# Patient Record
Sex: Female | Born: 1935 | Race: White | Hispanic: No | Smoking: Never smoker
Health system: Southern US, Community
[De-identification: ages and names within clinical notes are randomized; demographics above are authoritative.]

## PROBLEM LIST (undated history)

## (undated) DIAGNOSIS — J40 Bronchitis, not specified as acute or chronic: Secondary | ICD-10-CM

## (undated) DIAGNOSIS — I209 Angina pectoris, unspecified: Secondary | ICD-10-CM

## (undated) DIAGNOSIS — F99 Mental disorder, not otherwise specified: Secondary | ICD-10-CM

## (undated) DIAGNOSIS — G479 Sleep disorder, unspecified: Secondary | ICD-10-CM

## (undated) DIAGNOSIS — I10 Essential (primary) hypertension: Secondary | ICD-10-CM

## (undated) DIAGNOSIS — F32A Depression, unspecified: Secondary | ICD-10-CM

## (undated) DIAGNOSIS — M1990 Unspecified osteoarthritis, unspecified site: Secondary | ICD-10-CM

## (undated) DIAGNOSIS — R32 Unspecified urinary incontinence: Secondary | ICD-10-CM

## (undated) DIAGNOSIS — E119 Type 2 diabetes mellitus without complications: Secondary | ICD-10-CM

## (undated) DIAGNOSIS — R0789 Other chest pain: Secondary | ICD-10-CM

## (undated) DIAGNOSIS — C50919 Malignant neoplasm of unspecified site of unspecified female breast: Secondary | ICD-10-CM

## (undated) DIAGNOSIS — R011 Cardiac murmur, unspecified: Secondary | ICD-10-CM

## (undated) DIAGNOSIS — F329 Major depressive disorder, single episode, unspecified: Secondary | ICD-10-CM

## (undated) DIAGNOSIS — E039 Hypothyroidism, unspecified: Secondary | ICD-10-CM

## (undated) DIAGNOSIS — K219 Gastro-esophageal reflux disease without esophagitis: Secondary | ICD-10-CM

## (undated) DIAGNOSIS — R351 Nocturia: Secondary | ICD-10-CM

## (undated) DIAGNOSIS — E7800 Pure hypercholesterolemia, unspecified: Secondary | ICD-10-CM

## (undated) DIAGNOSIS — F419 Anxiety disorder, unspecified: Secondary | ICD-10-CM

## (undated) DIAGNOSIS — I2510 Atherosclerotic heart disease of native coronary artery without angina pectoris: Secondary | ICD-10-CM

## (undated) HISTORY — PX: CARDIAC CATHETERIZATION: SHX172

## (undated) HISTORY — DX: Hypothyroidism, unspecified: E03.9

## (undated) HISTORY — DX: Pure hypercholesterolemia, unspecified: E78.00

## (undated) HISTORY — DX: Essential (primary) hypertension: I10

## (undated) HISTORY — PX: COLONOSCOPY: SHX174

## (undated) HISTORY — PX: CORONARY ANGIOPLASTY: SHX604

## (undated) HISTORY — PX: EYE SURGERY: SHX253

## (undated) HISTORY — DX: Atherosclerotic heart disease of native coronary artery without angina pectoris: I25.10

## (undated) HISTORY — PX: CHOLECYSTECTOMY: SHX55

## (undated) HISTORY — PX: CATARACT EXTRACTION W/ INTRAOCULAR LENS  IMPLANT, BILATERAL: SHX1307

## (undated) HISTORY — PX: OTHER SURGICAL HISTORY: SHX169

## (undated) HISTORY — PX: ULNAR NERVE REPAIR: SHX2594

## (undated) HISTORY — DX: Malignant neoplasm of unspecified site of unspecified female breast: C50.919

---
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
## 1959-04-16 HISTORY — PX: DILATION AND CURETTAGE OF UTERUS: SHX78

## 1962-08-15 HISTORY — PX: ABDOMINAL HYSTERECTOMY: SHX81

## 1970-08-15 HISTORY — PX: TONSILLECTOMY AND ADENOIDECTOMY: SUR1326

## 1989-04-15 HISTORY — PX: NEUROPLASTY / TRANSPOSITION MEDIAN NERVE AT CARPAL TUNNEL BILATERAL: SUR894

## 1989-04-15 HISTORY — PX: PLANTAR FASCIA RELEASE: SHX2239

## 1999-04-16 HISTORY — PX: CORONARY ANGIOPLASTY WITH STENT PLACEMENT: SHX49

## 1999-08-16 HISTORY — PX: MASTECTOMY: SHX3

## 1999-08-16 HISTORY — PX: BREAST BIOPSY: SHX20

## 2001-10-12 ENCOUNTER — Encounter: Admission: RE | Admit: 2001-10-12 | Discharge: 2002-01-10 | Payer: Self-pay | Admitting: Anesthesiology

## 2002-01-14 ENCOUNTER — Encounter: Admission: RE | Admit: 2002-01-14 | Discharge: 2002-04-14 | Payer: Self-pay

## 2002-02-20 ENCOUNTER — Encounter: Payer: Self-pay | Admitting: Oncology

## 2002-02-20 ENCOUNTER — Ambulatory Visit (HOSPITAL_COMMUNITY): Admission: RE | Admit: 2002-02-20 | Discharge: 2002-02-20 | Payer: Self-pay | Admitting: Oncology

## 2002-04-15 ENCOUNTER — Encounter: Payer: Self-pay | Admitting: Emergency Medicine

## 2002-04-15 ENCOUNTER — Inpatient Hospital Stay (HOSPITAL_COMMUNITY): Admission: EM | Admit: 2002-04-15 | Discharge: 2002-04-18 | Payer: Self-pay | Admitting: Emergency Medicine

## 2002-04-18 ENCOUNTER — Encounter: Payer: Self-pay | Admitting: Cardiology

## 2005-02-18 ENCOUNTER — Ambulatory Visit: Payer: Self-pay | Admitting: Oncology

## 2005-04-08 ENCOUNTER — Ambulatory Visit: Payer: Self-pay | Admitting: Cardiology

## 2005-04-13 ENCOUNTER — Ambulatory Visit (HOSPITAL_COMMUNITY): Admission: RE | Admit: 2005-04-13 | Discharge: 2005-04-15 | Payer: Self-pay | Admitting: Cardiology

## 2005-04-13 ENCOUNTER — Ambulatory Visit: Payer: Self-pay | Admitting: Cardiology

## 2005-04-29 ENCOUNTER — Ambulatory Visit: Payer: Self-pay | Admitting: Cardiology

## 2005-06-07 ENCOUNTER — Ambulatory Visit: Payer: Self-pay

## 2005-07-27 ENCOUNTER — Ambulatory Visit: Payer: Self-pay | Admitting: Cardiology

## 2006-01-13 ENCOUNTER — Ambulatory Visit: Payer: Self-pay | Admitting: Cardiology

## 2006-02-09 ENCOUNTER — Ambulatory Visit: Payer: Self-pay | Admitting: Oncology

## 2006-02-27 LAB — CBC WITH DIFFERENTIAL/PLATELET
BASO%: 0.3 % (ref 0.0–2.0)
Basophils Absolute: 0 10*3/uL (ref 0.0–0.1)
EOS%: 2.1 % (ref 0.0–7.0)
Eosinophils Absolute: 0.1 10*3/uL (ref 0.0–0.5)
HCT: 38.3 % (ref 34.8–46.6)
HGB: 13.4 g/dL (ref 11.6–15.9)
LYMPH%: 23.8 % (ref 14.0–48.0)
MCH: 32.7 pg (ref 26.0–34.0)
MCHC: 34.8 g/dL (ref 32.0–36.0)
MCV: 93.9 fL (ref 81.0–101.0)
MONO#: 0.5 10*3/uL (ref 0.1–0.9)
MONO%: 7.6 % (ref 0.0–13.0)
NEUT#: 4.3 10*3/uL (ref 1.5–6.5)
NEUT%: 66.2 % (ref 39.6–76.8)
Platelets: 259 10*3/uL (ref 145–400)
RBC: 4.08 10*6/uL (ref 3.70–5.32)
RDW: 13.3 % (ref 11.3–14.5)
WBC: 6.4 10*3/uL (ref 3.9–10.0)
lymph#: 1.5 10*3/uL (ref 0.9–3.3)

## 2006-02-27 LAB — COMPREHENSIVE METABOLIC PANEL
ALT: 97 U/L — ABNORMAL HIGH (ref 0–40)
AST: 68 U/L — ABNORMAL HIGH (ref 0–37)
Albumin: 4.6 g/dL (ref 3.5–5.2)
Alkaline Phosphatase: 94 U/L (ref 39–117)
BUN: 14 mg/dL (ref 6–23)
CO2: 26 mEq/L (ref 19–32)
Calcium: 9.7 mg/dL (ref 8.4–10.5)
Chloride: 102 mEq/L (ref 96–112)
Creatinine, Ser: 0.78 mg/dL (ref 0.40–1.20)
Glucose, Bld: 102 mg/dL — ABNORMAL HIGH (ref 70–99)
Potassium: 3.6 mEq/L (ref 3.5–5.3)
Sodium: 139 mEq/L (ref 135–145)
Total Bilirubin: 0.4 mg/dL (ref 0.3–1.2)
Total Protein: 7.2 g/dL (ref 6.0–8.3)

## 2006-02-27 LAB — LACTATE DEHYDROGENASE: LDH: 169 U/L (ref 94–250)

## 2006-07-17 ENCOUNTER — Ambulatory Visit: Payer: Self-pay | Admitting: Cardiology

## 2006-07-25 ENCOUNTER — Ambulatory Visit: Payer: Self-pay

## 2006-09-06 ENCOUNTER — Ambulatory Visit: Payer: Self-pay | Admitting: Cardiology

## 2007-02-13 ENCOUNTER — Ambulatory Visit: Payer: Self-pay | Admitting: Cardiology

## 2007-04-27 ENCOUNTER — Ambulatory Visit: Payer: Self-pay | Admitting: Cardiology

## 2007-04-27 LAB — CONVERTED CEMR LAB
ALT: 16 units/L (ref 0–35)
AST: 19 units/L (ref 0–37)
Albumin: 4 g/dL (ref 3.5–5.2)
Alkaline Phosphatase: 64 units/L (ref 39–117)
Bilirubin, Direct: 0.1 mg/dL (ref 0.0–0.3)
Cholesterol: 115 mg/dL (ref 0–200)
HDL: 44.2 mg/dL (ref >39.0)
LDL Cholesterol: 54 mg/dL (ref 0–99)
Total Bilirubin: 0.8 mg/dL (ref 0.3–1.2)
Total CHOL/HDL Ratio: 2.6
Total Protein: 7 g/dL (ref 6.0–8.3)
Triglycerides: 82 mg/dL (ref 0–149)
VLDL: 16 mg/dL (ref 0–40)

## 2007-09-11 ENCOUNTER — Ambulatory Visit: Payer: Self-pay | Admitting: Cardiology

## 2007-09-14 ENCOUNTER — Encounter: Payer: Self-pay | Admitting: Cardiology

## 2007-09-14 ENCOUNTER — Ambulatory Visit: Payer: Self-pay

## 2008-05-06 ENCOUNTER — Ambulatory Visit: Payer: Self-pay | Admitting: Cardiology

## 2008-05-09 ENCOUNTER — Ambulatory Visit: Payer: Self-pay | Admitting: Cardiology

## 2008-05-09 ENCOUNTER — Ambulatory Visit: Payer: Self-pay

## 2008-05-09 LAB — CONVERTED CEMR LAB
ALT: 14 units/L (ref 0–35)
AST: 19 units/L (ref 0–37)
Albumin: 4.2 g/dL (ref 3.5–5.2)
Alkaline Phosphatase: 71 units/L (ref 39–117)
BUN: 10 mg/dL (ref 6–23)
Basophils Absolute: 0 10*3/uL (ref 0.0–0.1)
Basophils Relative: 0.4 % (ref 0.0–3.0)
Bilirubin, Direct: 0.1 mg/dL (ref 0.0–0.3)
CO2: 32 meq/L (ref 19–32)
Calcium: 9.4 mg/dL (ref 8.4–10.5)
Chloride: 106 meq/L (ref 96–112)
Cholesterol: 177 mg/dL (ref 0–200)
Creatinine, Ser: 0.7 mg/dL (ref 0.4–1.2)
Eosinophils Absolute: 0.1 10*3/uL (ref 0.0–0.7)
Eosinophils Relative: 2 % (ref 0.0–5.0)
GFR calc Af Amer: 106 mL/min
GFR calc non Af Amer: 87 mL/min
Glucose, Bld: 87 mg/dL (ref 70–99)
HCT: 42 % (ref 36.0–46.0)
HDL: 69.7 mg/dL (ref >39.0)
Hemoglobin: 14.7 g/dL (ref 12.0–15.0)
LDL Cholesterol: 92 mg/dL (ref 0–99)
Lymphocytes Relative: 25.8 % (ref 12.0–46.0)
MCHC: 35 g/dL (ref 30.0–36.0)
MCV: 91.5 fL (ref 78.0–100.0)
Monocytes Absolute: 0.5 10*3/uL (ref 0.1–1.0)
Monocytes Relative: 8.2 % (ref 3.0–12.0)
Neutro Abs: 3.6 10*3/uL (ref 1.4–7.7)
Neutrophils Relative %: 63.6 % (ref 43.0–77.0)
Platelets: 278 10*3/uL (ref 150–400)
Potassium: 4.6 meq/L (ref 3.5–5.1)
RBC: 4.59 M/uL (ref 3.87–5.11)
RDW: 12.8 % (ref 11.5–14.6)
Sodium: 143 meq/L (ref 135–145)
Total Bilirubin: 0.9 mg/dL (ref 0.3–1.2)
Total CHOL/HDL Ratio: 2.5
Total Protein: 7.2 g/dL (ref 6.0–8.3)
Triglycerides: 75 mg/dL (ref 0–149)
VLDL: 15 mg/dL (ref 0–40)
WBC: 5.6 10*3/uL (ref 4.5–10.5)

## 2008-07-28 DIAGNOSIS — E039 Hypothyroidism, unspecified: Secondary | ICD-10-CM | POA: Insufficient documentation

## 2008-07-28 DIAGNOSIS — E785 Hyperlipidemia, unspecified: Secondary | ICD-10-CM | POA: Insufficient documentation

## 2008-07-28 DIAGNOSIS — I2510 Atherosclerotic heart disease of native coronary artery without angina pectoris: Secondary | ICD-10-CM | POA: Insufficient documentation

## 2008-07-28 DIAGNOSIS — I10 Essential (primary) hypertension: Secondary | ICD-10-CM | POA: Insufficient documentation

## 2008-10-29 ENCOUNTER — Encounter: Payer: Self-pay | Admitting: Cardiology

## 2008-10-29 ENCOUNTER — Ambulatory Visit: Payer: Self-pay | Admitting: Cardiology

## 2008-11-27 ENCOUNTER — Telehealth (INDEPENDENT_AMBULATORY_CARE_PROVIDER_SITE_OTHER): Payer: Self-pay | Admitting: *Deleted

## 2009-02-09 ENCOUNTER — Telehealth: Payer: Self-pay | Admitting: Cardiology

## 2009-02-19 ENCOUNTER — Encounter: Payer: Self-pay | Admitting: Cardiology

## 2009-06-17 ENCOUNTER — Ambulatory Visit: Payer: Self-pay | Admitting: Cardiology

## 2009-06-17 DIAGNOSIS — I359 Nonrheumatic aortic valve disorder, unspecified: Secondary | ICD-10-CM | POA: Insufficient documentation

## 2009-06-22 ENCOUNTER — Ambulatory Visit: Payer: Self-pay | Admitting: Cardiology

## 2009-06-25 LAB — CONVERTED CEMR LAB
BUN: 12 mg/dL (ref 6–23)
Basophils Absolute: 0 10*3/uL (ref 0.0–0.1)
Basophils Relative: 0.3 % (ref 0.0–3.0)
CO2: 32 meq/L (ref 19–32)
Calcium: 9.6 mg/dL (ref 8.4–10.5)
Chloride: 98 meq/L (ref 96–112)
Creatinine, Ser: 0.7 mg/dL (ref 0.4–1.2)
Eosinophils Absolute: 0.1 10*3/uL (ref 0.0–0.7)
Eosinophils Relative: 2 % (ref 0.0–5.0)
GFR calc non Af Amer: 87 mL/min (ref >60)
Glucose, Bld: 83 mg/dL (ref 70–99)
HCT: 38.5 % (ref 36.0–46.0)
Hemoglobin: 13.4 g/dL (ref 12.0–15.0)
INR: 1 (ref 0.8–1.0)
Lymphocytes Relative: 32.7 % (ref 12.0–46.0)
Lymphs Abs: 1.5 10*3/uL (ref 0.7–4.0)
MCHC: 34.7 g/dL (ref 30.0–36.0)
MCV: 93.4 fL (ref 78.0–100.0)
Monocytes Absolute: 0.3 10*3/uL (ref 0.1–1.0)
Monocytes Relative: 7 % (ref 3.0–12.0)
Neutro Abs: 2.7 10*3/uL (ref 1.4–7.7)
Neutrophils Relative %: 58 % (ref 43.0–77.0)
Platelets: 217 10*3/uL (ref 150.0–400.0)
Potassium: 4.3 meq/L (ref 3.5–5.1)
Prothrombin Time: 10.5 s (ref 9.1–11.7)
RBC: 4.12 M/uL (ref 3.87–5.11)
RDW: 12.4 % (ref 11.5–14.6)
Sodium: 138 meq/L (ref 135–145)
WBC: 4.6 10*3/uL (ref 4.5–10.5)

## 2009-06-26 ENCOUNTER — Encounter: Payer: Self-pay | Admitting: Cardiology

## 2009-06-26 ENCOUNTER — Ambulatory Visit: Payer: Self-pay | Admitting: Cardiology

## 2009-06-26 ENCOUNTER — Telehealth: Payer: Self-pay | Admitting: Cardiology

## 2009-06-26 ENCOUNTER — Ambulatory Visit: Payer: Self-pay

## 2009-06-26 ENCOUNTER — Ambulatory Visit (HOSPITAL_COMMUNITY): Admission: RE | Admit: 2009-06-26 | Discharge: 2009-06-26 | Payer: Self-pay | Admitting: Cardiology

## 2009-06-26 ENCOUNTER — Inpatient Hospital Stay (HOSPITAL_BASED_OUTPATIENT_CLINIC_OR_DEPARTMENT_OTHER): Admission: RE | Admit: 2009-06-26 | Discharge: 2009-06-26 | Payer: Self-pay | Admitting: Cardiology

## 2009-07-03 ENCOUNTER — Ambulatory Visit: Payer: Self-pay | Admitting: Cardiology

## 2009-07-03 DIAGNOSIS — I5033 Acute on chronic diastolic (congestive) heart failure: Secondary | ICD-10-CM

## 2009-07-03 DIAGNOSIS — I5032 Chronic diastolic (congestive) heart failure: Secondary | ICD-10-CM | POA: Insufficient documentation

## 2010-06-03 ENCOUNTER — Encounter: Payer: Self-pay | Admitting: Cardiology

## 2010-06-07 ENCOUNTER — Ambulatory Visit: Payer: Self-pay | Admitting: Cardiology

## 2010-06-08 ENCOUNTER — Encounter: Payer: Self-pay | Admitting: Cardiology

## 2010-07-22 ENCOUNTER — Ambulatory Visit (HOSPITAL_COMMUNITY)
Admission: RE | Admit: 2010-07-22 | Discharge: 2010-07-23 | Payer: Self-pay | Source: Home / Self Care | Attending: Ophthalmology | Admitting: Ophthalmology

## 2010-09-16 NOTE — Cardiovascular Report (Signed)
Summary: Pre Cath Orders  Pre Cath Orders   Imported By: Roderic Ovens 07/02/2009 14:40:51  _____________________________________________________________________  External Attachment:    Type:   Image     Comment:   External Document

## 2010-09-16 NOTE — Progress Notes (Signed)
   Sent To [facility] A [employer] request for Records.

## 2010-09-16 NOTE — Assessment & Plan Note (Signed)
Summary: f1y   Visit Type:  1 year follow up Primary Provider:  Dr Lindwood Qua  CC:  chest pain and stabbing.  History of Present Illness: 77 yo with h/o CAD s/p PCI to LAD presents for cardiology followup.  Given chest pain and shortness of breath, she had a left heart cath in 11/10 that showed a patent proximal LAD stent and 50% mid LAD stenosis, similar to prior cath (2006).  I think that her chest pain was noncardiac.  Since that time, she has continued to have occasional episodes of atypical chest pain.  She has 1-2 episodes of chest tightness radiating to her neck each month.  Usually episodes last 3-4 minutes.  They are not exertional.  She had an episode about a month ago after eating lunch that was especially severe. This is a chronic pattern of chest pain with no change.  No exertional dyspnea or chest pain.  BP is slightly elevated today but has been < 140 systolic when checked at home.     Labs (7/10): HDL 72, LDL 59, LFTs normal  Problems Prior to Update: 1)  Diastolic Heart Failure, Chronic  (ICD-428.32) 2)  Aortic Insufficiency  (ICD-424.1) 3)  Unspecified Hypothyroidism  (ICD-244.9) 4)  Hypertension, Unspecified  (ICD-401.9) 5)  Hyperlipidemia-mixed  (ICD-272.4) 6)  Cad, Native Vessel  (ICD-414.01)  Current Medications (verified): 1)  Micardis Hct 80-12.5 Mg Tabs (Telmisartan-Hctz) .... Once Daily 2)  Toprol Xl 50 Mg Xr24h-Tab (Metoprolol Succinate) .... Once Daily 3)  Lipitor 80 Mg Tabs (Atorvastatin Calcium) .... Take One Tablet By Mouth Daily. 4)  Synthroid 75 Mcg Tabs (Levothyroxine Sodium) .... Once Daily 5)  Adult Aspirin Ec Low Strength 81 Mg Tbec (Aspirin) .... Qd 6)  Trazodone Hcl 50 Mg Tabs (Trazodone Hcl) .... 3 At Bedtime For Sleep 7)  Venlafaxine Hcl 75 Mg Tabs (Venlafaxine Hcl) .... Two Times A Day 8)  Vitamin D 400 Unit  Tabs (Cholecalciferol) .... Once Daily 9)  Isosorbide Mononitrate Cr 30 Mg Xr24h-Tab (Isosorbide Mononitrate) .... Take One Tablet By  Mouth Daily  Allergies (verified): No Known Drug Allergies  Past History:  Past Medical History: Reviewed history from 07/03/2009 and no changes required. 1. Hypercholesterolemia. 2. Coronary artery disease.  The patient had a left heart catheterization in August 2006 showed an EF of 70%, a 70% mid LAD lesion and 80% distal LAD lesion, as well as a 50% ostial first diagonal lesion.  The patient did have a drug-eluting stent placed in her mid LAD at that time.  She was on Plavix for 2-1/2 years. She stopped Plavix in January 2009.  Adenosine myoview (9/09):  EF 69% with normal perfusion images suggesting no ischemia or infarction.  Repeat LHC for CP (11/10) showed patent prox LAD stent, 50% mLAD stenosis.  Study was similar to 2006.  3. Diastolic CHF: Echo (11/10) with EF 55-60%, moderate diastolic dysfunction, no regional WMAs, mild to moderate TR, moderate LAE, mild AI.  4. Hypertension. 5. Hypothyroidism.  6. Breast CA: treated with mastectomy/tamoxifen in early 2000s.    Family History: Reviewed history from 07/28/2008 and no changes required. Noncontributory.   Social History: Reviewed history from 07/03/2009 and no changes required. The patient works part-time at a day care.  She is a nonsmoker. She lives in [place]. She is married.   Vital Signs:  Patient profile:   77 year old female Height:      63 inches Weight:      140.50 pounds Pulse rate:  60 / minute BP sitting:   142 / 68  (left arm) Cuff size:   regular  Vitals Entered By: Caralee Ates CMA (June 07, 2010 8:57 AM)  Physical Exam  General:  Well developed, well nourished, in no acute distress. Neck:  Neck supple, no JVD. No masses, thyromegaly or abnormal cervical nodes. Lungs:  Clear bilaterally to auscultation and percussion. Heart:  Non-displaced PMI, chest non-tender; regular rate and rhythm, S1, S2 without rubs or gallops.  1/6 SEM RUSB.  Carotid upstroke normal, no bruit.  Pedals normal pulses. No  edema, no varicosities. Right groin cath site benign.   Abdomen:  Bowel sounds positive; abdomen soft and non-tender without masses, organomegaly, or hernias noted. No hepatosplenomegaly. Extremities:  No clubbing or cyanosis. Neurologic:  Alert and oriented x 3. Psych:  Normal affect.   Impression & Recommendations:  Problem # 1:  CAD, NATIVE VESSEL (ICD-414.01) Occasional, atypical chest pain episodes in a patient with known CAD. This is chronic and has not changed.  No exertional chest pain.  Continue current cardiac meds: ARB, beta blocker, statin, ASA, Imdur.  No stress test unless pattern worsens or changes.   Problem # 2:  HYPERTENSION, UNSPECIFIED (ICD-401.9) BP has been doing well at home, marginally elevated today.  Continue same meds.   Problem # 3:  HYPERLIPIDEMIA-MIXED (ICD-272.4) We will ask for lipids from her PCP.  Goal LDL < 70.   Problem # 4:  AORTIC INSUFFICIENCY (ICD-424.1) Mild on most recent echo.  Should likely repeat echo in a year or so to assess for any progression.   Patient Instructions: 1)  Your physician wants you to follow-up in: 1 year with Dr Shirlee Latch.   You will receive a reminder letter in the mail two months in advance. If you don't receive a letter, please call our office to schedule the follow-up appointment.

## 2010-09-16 NOTE — Assessment & Plan Note (Signed)
Visit Type:  Follow-up Primary Provider:  Dr Lindwood Qua  CC:  tightness in chest/uncomfortable in chest/washed out.  History of Present Illness: 77 yo with h/o CAD s/p PCI to LAD presents for cardiology followup.  At last appt, pt reported some atypical CP.  We did an adenosine myoview in 9/09 that was low risk (no evidence for ischemia/infarction).  She continues to have episodes of atypical CP.  These manifest as tightness across the lower chest that typically occurs at rest.  It occurs 5-6 times a week and is mild in intensity.  She has started exercising again, walking 3 miles/day.  She gets mildly short of breath going up a hill but has had no chest tightness with exercise.    9/09 Labs:  LDL 92, HDL 69, TG 75  Current Medications (verified): 1)  Micardis Hct 80-12.5 Mg Tabs (Telmisartan-Hctz) .... Once Daily 2)  Toprol Xl 50 Mg Xr24h-Tab (Metoprolol Succinate) .... Once Daily 3)  Zocor 80  Mg Tabs (Simvastatin) .... Once Daily 4)  Imdur 60 Mg Xr24h-Tab (Isosorbide Mononitrate) .... Once Daily 5)  Synthroid 75 Mcg Tabs (Levothyroxine Sodium) .... Once Daily 6)  Adult Aspirin Ec Low Strength 81 Mg Tbec (Aspirin) .... Qd 7)  Trazodone Hcl 50 Mg Tabs (Trazodone Hcl) .Marland Kitchen.. 1-3 As Needed As Needed Sleep 8)  Womens One A Day Mvi .... One Daily  Past History:  Past Medical History:    1. Hypercholesterolemia.    2. Coronary artery disease.  The patient had a left heart        catheterization in August 2006 showed an EF of 70%, a 70% mid LAD        lesion and 80% distal LAD lesion, as well as a 50% ostial first        diagonal lesion.  The patient did have a drug-eluting stent placed        in her mid LAD at that time.  She was on Plavix for 2-1/2 years.        She stopped Plavix in January 2009.  Adenosine myoview (9/09):  EF 69% with normal perfusion images suggesting no ischemia or infarction.      3. Echocardiogram in January 2009, this showed mild LVH, EF of 55-65%,  moderate left atrial enlargement, mild aortic insufficiency and        aortic sclerosis without aortic     4. Hypertension.    5. Hypothyroidism.   Family History:    Reviewed history from 07/28/2008 and no changes required:       Noncontributory.   Social History:    Reviewed history from 07/28/2008 and no changes required:       The patient works part-time at a day care.  She is a       nonsmoker.   Review of Systems       Negative except as per HPI  Vital Signs:  Patient profile:   77 year old female Height:      63 inches Weight:      138 pounds BMI:     24.53 Pulse rate:   56 / minute Pulse rhythm:   regular BP sitting:   130 / 68  (left arm)  Vitals Entered By: Burnett Kanaris, CMA (October 29, 2008 2:52 PM)  Physical Exam  General:  Well developed, well nourished, in no acute distress. Head:  normocephalic and atraumatic Nose:  no deformity, discharge, inflammation, or lesions Mouth:  Teeth, gums and palate normal.  Oral mucosa normal. Neck:  Neck supple, no JVD. No masses, thyromegaly or abnormal cervical nodes. Lungs:  Clear bilaterally to auscultation and percussion. Heart:  Non-displaced PMI, chest non-tender; regular rate and rhythm, S1, S2 without murmurs, rubs or gallops. Carotid upstroke normal, no bruit. Pedals normal pulses. No edema, no varicosities. Abdomen:  Bowel sounds positive; abdomen soft and non-tender without masses, organomegaly, or hernias noted. No hepatosplenomegaly. Msk:  Back normal, normal gait. Muscle strength and tone normal. Extremities:  No clubbing or cyanosis. Neurologic:  Alert and oriented x 3. Skin:  Intact without lesions or rashes. Psych:  Normal affect.   Impression & Recommendations:  Problem # 1:  CAD, NATIVE VESSEL (ICD-414.01) Pt continues to have atypical CP symptoms.  Her adenosine myoview was a normal study in 9/09 (when she was having the same symptoms) so was reassuring.  She does not get CP with exertion.  I have  told pt that if she has any exertional symptoms, we will proceed to heart catheterization.  For now, we will increase her Imdur to 90 mg daily to see if going up on nitrates has any effect.  She will continue her ARB, beta blocker, ASA, and statin.    Problem # 2:  HYPERTENSION, UNSPECIFIED (ICD-401.9) Blood pressure is at goal.    Problem # 3:  HYPERLIPIDEMIA-MIXED (ICD-272.4) We increased her Zocor at last appt with goal LDL < 70.  She will get followup lipids and LFTs.

## 2010-09-16 NOTE — Progress Notes (Signed)
Summary: clarify meds  Phone Note Refill Request Call back at [phone] Message from:  Pharmacy on [facility]    prescription written for Imdur.  patient has been taking generic.  Is it ok for her to continue on generic?  Initial call taken by: Burnard Leigh,  June 26, 2009 4:24 PM  Follow-up for Phone Call        Dr. Shirlee Latch put her back on this medication today.  She had a cath over at [facility].  It will be fine to continue generic.   Follow-up by: Judithe Modest CMA,  June 26, 2009 4:53 PM

## 2010-09-16 NOTE — Assessment & Plan Note (Signed)
Summary: 3wk f/u sl   Primary Provider:  Dr Lindwood Qua   History of Present Illness: 77 yo with h/o CAD s/p PCI to LAD presents for cardiology followup.  At last appointment, she reported continued episodes of CP.  These manifested as tightness across the lower chest typically occurring at rest, often when she sits down after exerting herself.  Pain typically lasts for about a minute.  Episodes have been occurring once weekly for over a year now.  In 9/09, she had an adenosine myoview with no evidence for ischemia or infarction.  She sometimes takes nitroglycerine and feels that it can help.  She is short of breath with mopping and vacuuming, also after walking about 1/2-3/4 miles on flat ground.  As the chest pain had worsened over the last couple of months, we ended up deciding to repeat a cardiac catheterization.  This was done in 11/10 and showed a patent proximal LAD stent and 50% mid LAD stenosis, similar to prior cath (2006).  I suspect that her chest pain is noncardiac, ? stress related.  She continues to have occasional chest pain episodes, most recently today.  She had sharp pain in the center of her chest on and off for much of the morning.  Episodes lasted only about 30 sec at a time.  Followup echo showed normal LV systolic function and diastolic dysfunction.  She has lost about 5 lbs since last appointment and is avoiding salt.    Labs (7/10): HDL 72, LDL 59, LFTs normal  Current Medications (verified): 1)  Micardis Hct 80-12.5 Mg Tabs (Telmisartan-Hctz) .... Once Daily 2)  Toprol Xl 50 Mg Xr24h-Tab (Metoprolol Succinate) .... Once Daily 3)  Lipitor 80 Mg Tabs (Atorvastatin Calcium) .... Take One Tablet By Mouth Daily. 4)  Synthroid 75 Mcg Tabs (Levothyroxine Sodium) .... Once Daily 5)  Adult Aspirin Ec Low Strength 81 Mg Tbec (Aspirin) .... Qd 6)  Trazodone Hcl 50 Mg Tabs (Trazodone Hcl) .... 3 At Bedtime For Sleep 7)  Venlafaxine Hcl 75 Mg Tabs (Venlafaxine Hcl) .... Two Times A  Day 8)  Calcium Carbonate-Vitamin D 600-400 Mg-Unit  Tabs (Calcium Carbonate-Vitamin D) .... Once Daily 9)  Vitamin D 400 Unit  Tabs (Cholecalciferol) .... Once Daily 10)  Isosorbide Mononitrate Cr 30 Mg Xr24h-Tab (Isosorbide Mononitrate) .... Take One Tablet By Mouth Daily  Allergies (verified): No Known Drug Allergies  Past History:  Past Medical History: 1. Hypercholesterolemia. 2. Coronary artery disease.  The patient had a left heart catheterization in August 2006 showed an EF of 70%, a 70% mid LAD lesion and 80% distal LAD lesion, as well as a 50% ostial first diagonal lesion.  The patient did have a drug-eluting stent placed in her mid LAD at that time.  She was on Plavix for 2-1/2 years. She stopped Plavix in January 2009.  Adenosine myoview (9/09):  EF 69% with normal perfusion images suggesting no ischemia or infarction.  Repeat LHC for CP (11/10) showed patent prox LAD stent, 50% mLAD stenosis.  Study was similar to 2006.  3. Diastolic CHF: Echo (11/10) with EF 55-60%, moderate diastolic dysfunction, no regional WMAs, mild to moderate TR, moderate LAE, mild AI.  4. Hypertension. 5. Hypothyroidism.  6. Breast CA: treated with mastectomy/tamoxifen in early 2000s.    Family History: Reviewed history from 07/28/2008 and no changes required. Noncontributory.   Social History: Reviewed history from 06/17/2009 and no changes required. The patient works part-time at a day care.  She is a nonsmoker. She  lives in [place]. She is married.   Review of Systems       All systems reviewed and negative except as per HPI.   Vital Signs:  Patient profile:   76 year old female Height:      63 inches Weight:      138 pounds BMI:     24.53 Pulse rate:   70 / minute BP sitting:   130 / 70  (left arm) Cuff size:   regular  Vitals Entered By: Hardin Negus, RMA (July 03, 2009 4:28 PM)  Physical Exam  General:  Well developed, well nourished, in no acute distress. Neck:  Neck  supple, no JVD. No masses, thyromegaly or abnormal cervical nodes. Lungs:  Clear bilaterally to auscultation and percussion. Heart:  Non-displaced PMI, chest non-tender; regular rate and rhythm, S1, S2 without rubs or gallops.  1/6 SEM RUSB.  Carotid upstroke normal, no bruit.  Pedals normal pulses. No edema, no varicosities. Right groin cath site benign.   Abdomen:  Bowel sounds positive; abdomen soft and non-tender without masses, organomegaly, or hernias noted. No hepatosplenomegaly. Extremities:  No clubbing or cyanosis. Neurologic:  Alert and oriented x 3. Psych:  Normal affect.   Impression & Recommendations:  Problem # 1:  CAD, NATIVE VESSEL (ICD-414.01) Patient has chest pain with both typical and atypical features.  LHC this month showed stable 50% mLAD stenosis that is unlikely to be the cause of her symptoms.  I think that she has noncardiac chest pain.  She needs to continue aggressive risk factor management with ARB, beta blocker, statin, ASA.  Lipids were at goal when last checked.    Problem # 2:  AORTIC INSUFFICIENCY (ICD-424.1) Mild on most recent echo.    Problem # 3:  HYPERTENSION, UNSPECIFIED (ICD-401.9) Well-controlled on current regimen.   Problem # 4:  DIASTOLIC HEART FAILURE, CHRONIC (ICD-428.32) NYHA class II exertional dyspnea.  No volume overload on exam.  Moderate diastolic dysfunction on echo with preserved EF.  Best management here will be good blood pressure control.   I will see her in followup in 1 year unless she needs to be seen sooner.   Patient Instructions: 1)  Please ask Dr Mikey Bussing to fax your cholesterol results to Dr Shirlee Latch [phone] 2)  Your physician recommends that you schedule a follow-up appointment in: 1 year with Dr. Marca Ancona --you should get a letter in the mail 2 months before the appointment --if you do not get a letter call our office and schedule an appointment  [phone]

## 2010-09-16 NOTE — Assessment & Plan Note (Signed)
Summary: f45m   Primary Provider:  Dr Lindwood Qua   History of Present Illness: 77 yo with h/o CAD s/p PCI to LAD presents for cardiology followup.  She continues to have episodes of CP.  These manifest as tightness across the lower chest that typically occurs at rest, often when she sits down after exerting herself.  Pain typically lasts for about a minute.  Episodes are occurring once weekly and have been occurring for over a year now.  In 9/09, she had an adenosine myoview with no evidence for ischemia or infarction.  She sometimes takes nitroglycerine and feels that it can help.  Chest pain seems worse over the last couple of months and has been worrying her recently.  She had a more prolonged episode recently while in [place] visiting her new great-granddaughter.  She is short of breath with mopping and vacuuming, also after walking about 1/2-3/4 miles on flat ground.   Labs (7/10): HDL 72, LDL 59, LFTs normal  ECG: NSR, normal  Current Medications (verified): 1)  Micardis Hct 80-12.5 Mg Tabs (Telmisartan-Hctz) .... Once Daily 2)  Toprol Xl 50 Mg Xr24h-Tab (Metoprolol Succinate) .... Once Daily 3)  Lipitor 80 Mg Tabs (Atorvastatin Calcium) .... Take One Tablet By Mouth Daily. 4)  Synthroid 75 Mcg Tabs (Levothyroxine Sodium) .... Once Daily 5)  Adult Aspirin Ec Low Strength 81 Mg Tbec (Aspirin) .... Qd 6)  Trazodone Hcl 50 Mg Tabs (Trazodone Hcl) .... 3 At Bedtime For Sleep 7)  Womens One A Day Mvi .... One Daily 8)  Venlafaxine Hcl 75 Mg Tabs (Venlafaxine Hcl) .... Two Times A Day 9)  Calcium Carbonate-Vitamin D 600-400 Mg-Unit  Tabs (Calcium Carbonate-Vitamin D) .... Once Daily 10)  Vitamin D 400 Unit  Tabs (Cholecalciferol) .... Once Daily  Allergies (verified): No Known Drug Allergies  Past History:  Past Medical History: 1. Hypercholesterolemia. 2. Coronary artery disease.  The patient had a left heart catheterization in August 2006 showed an EF of 70%, a 70% mid LAD lesion  and 80% distal LAD lesion, as well as a 50% ostial first diagonal lesion.  The patient did have a drug-eluting stent placed in her mid LAD at that time.  She was on Plavix for 2-1/2 years. She stopped Plavix in January 2009.  Adenosine myoview (9/09):  EF 69% with normal perfusion images suggesting no ischemia or infarction.   3. Echocardiogram in January 2009, this showed mild LVH, EF of 55-65%, moderate left atrial enlargement, mild aortic insufficiency and aortic sclerosis without aortic  4. Hypertension. 5. Hypothyroidism.   Family History: Reviewed history from 07/28/2008 and no changes required. Noncontributory.   Social History: The patient works part-time at a day care.  She is a nonsmoker. She lives in [place].   Review of Systems       All systems reviewed and negative except as per HPI.   Vital Signs:  Patient profile:   77 year old female Height:      63 inches Weight:      143 pounds BMI:     25.42 Pulse rate:   81 / minute BP sitting:   142 / 70  (left arm) Cuff size:   regular  Vitals Entered By: Hardin Negus, RMA (June 17, 2009 12:04 PM)  Physical Exam  General:  Well developed, well nourished, in no acute distress. Neck:  Neck supple, no JVD. No masses, thyromegaly or abnormal cervical nodes. Lungs:  Clear bilaterally to auscultation and percussion. Heart:  Non-displaced PMI, chest non-tender; regular rate and rhythm, S1, S2 without murmurs, rubs or gallops. Carotid upstroke normal, no bruit.  Pedals normal pulses. No edema, no varicosities. Abdomen:  Bowel sounds positive; abdomen soft and non-tender without masses, organomegaly, or hernias noted. No hepatosplenomegaly. Extremities:  No clubbing or cyanosis. Neurologic:  Alert and oriented x 3. Psych:  Normal affect.   Impression & Recommendations:  Problem # 1:  CAD, NATIVE VESSEL (ICD-414.01) Patient has had episodes of atypical chest pain for over a year now.  They seem to be worsening  recently.  Interestingly, pain tends to occur when she is sitting down after exerting herself.  Nitroglycerine sometimes relieves symptoms.  She had a negative adenosine myoview in 9/09.  She is becoming worried that she has another blockage.  Given the normal stress test in the setting of similar pain, I think that our best choice at this point to clear up what is going on will be a left heart cath.  I will set her up for this next week.  She should continue ASA, statin, ARB, and beta blocker.    Problem # 2:  AORTIC INSUFFICIENCY (ICD-424.1) HIstory of mild aortic insufficiency.  Repeat echo to assess for progression.  No diastolic murmur noted.   Problem # 3:  HYPERTENSION, UNSPECIFIED (ICD-401.9) BP slightly high today but has been running consistently in the 130s at home.   Problem # 4:  HYPERLIPIDEMIA-MIXED (ICD-272.4) Lipids reasonably controlled.   Other Orders: Echocardiogram (Echo) Cardiac Catheterization (Cardiac Cath)  Patient Instructions: 1)  Your physician recommends that you return for lab work on Monday November 8,2010--BMP/CBC/PT 786.50 424.1 401.9 2)  Your physician has requested that you have an echocardiogram.  Echocardiography is a painless test that uses sound waves to create images of your heart. It provides your doctor with information about the size and shape of your heart and how well your heart's chambers and valves are working.  This procedure takes approximately one hour. There are no restrictions for this procedure. 3)  Your physician has requested that you have a cardiac catheterization.  Cardiac catheterization is used to diagnose and/or treat various heart conditions. Doctors may recommend this procedure for a number of different reasons. The most common reason is to evaluate chest pain. Chest pain can be a symptom of coronary artery disease (CAD), and cardiac catheterization can show whether plaque is narrowing or blocking your heart's arteries. This procedure is  also used to evaluate the valves, as well as measure the blood flow and oxygen levels in different parts of your heart.  For further information please visit [email or web address]  Please follow instruction sheet, as given. 4)  Your physician recommends that you schedule a follow-up appointment in: 2-3 weeks with Dr. Marca Ancona

## 2010-10-25 LAB — CBC
HCT: 37.7 % (ref 36.0–46.0)
Hemoglobin: 12.5 g/dL (ref 12.0–15.0)
MCH: 30.6 pg (ref 26.0–34.0)
MCHC: 33.2 g/dL (ref 30.0–36.0)
MCV: 92.2 fL (ref 78.0–100.0)
Platelets: 210 10*3/uL (ref 150–400)
RBC: 4.09 MIL/uL (ref 3.87–5.11)
RDW: 12.7 % (ref 11.5–15.5)
WBC: 4.9 10*3/uL (ref 4.0–10.5)

## 2010-10-25 LAB — BASIC METABOLIC PANEL
BUN: 9 mg/dL (ref 6–23)
CO2: 32 mEq/L (ref 19–32)
Calcium: 9.6 mg/dL (ref 8.4–10.5)
Chloride: 104 mEq/L (ref 96–112)
Creatinine, Ser: 0.69 mg/dL (ref 0.4–1.2)
GFR calc Af Amer: >60 mL/min (ref >60)
GFR calc non Af Amer: >60 mL/min (ref >60)
Glucose, Bld: 113 mg/dL — ABNORMAL HIGH (ref 70–99)
Potassium: 4.8 mEq/L (ref 3.5–5.1)
Sodium: 140 mEq/L (ref 135–145)

## 2010-10-25 LAB — GLUCOSE, CAPILLARY
Glucose-Capillary: 113 mg/dL — ABNORMAL HIGH (ref 70–99)
Glucose-Capillary: 142 mg/dL — ABNORMAL HIGH (ref 70–99)
Glucose-Capillary: 161 mg/dL — ABNORMAL HIGH (ref 70–99)
Glucose-Capillary: 190 mg/dL — ABNORMAL HIGH (ref 70–99)

## 2010-10-25 LAB — SURGICAL PCR SCREEN
MRSA, PCR: POSITIVE — AB
Staphylococcus aureus: POSITIVE — AB

## 2010-11-17 LAB — POCT I-STAT GLUCOSE
Glucose, Bld: 92 mg/dL (ref 70–99)
Operator id: 221371

## 2010-12-28 NOTE — Assessment & Plan Note (Signed)
[facility]                            CARDIOLOGY OFFICE NOTE   NAME:[name] [name], [name] [name]                         MRN:          [id number]  DATE:09/11/2007                            DOB:          June 09, 1934    FOLLOW-UP VISIT   PRIMARY CARE PHYSICIAN:  Dr. Lindwood Qua.   REASON FOR VISIT:  Cardiology followup.   HISTORY OF PRESENT ILLNESS:  [name] [name] is doing well.  She has lost  approximately 75 pounds, with a combination of diet and exercise.  She  is not reporting any significant angina at this time.  She states that  she was told she had a loud cardiac murmur, noted on her recent  examination.  She has had no problems of fevers or chills, rashes,  malaise.  Her electrocardiogram is stable showing sinus bradycardia,  with a relatively low voltage.  Her medical regimen is outlined below  and has been stable.  Follow-up lipids on simvastatin looked good with  an LDL of 54, HDL of 44, triglycerides 82, and a total cholesterol of  115.  She had normal liver function tests.  She has not been exercising  quite as regularly with the cold weather.   ALLERGIES:  NO KNOWN DRUG ALLERGIES.   MEDICATIONS:  1. Micardis 80/12.5 mg p.o.q.d.  2. Synthroid 75 mcg p.o.q.d.  3. Plavix 75 mg p.o.q.d. (out).  4. Aspirin 325 mg p.o.q.d.  5. Metoprolol XL 50 mg p.o.q.d.  6. Imdur 30 mg p.o.q.d.  7. Simvastatin 40 mg p.o. q.h.s.   REVIEW OF SYSTEMS:  As described in history of present illness.   EXAMINATION:  VITAL SIGNS:  Blood pressure is 130/75, heart rate is 60,  weight is 136 pounds.  GENERAL:  The patient is comfortable, no acute distress.  HEENT:  Conjunctivae was normal.  Pharynx is clear.  NECK:  Supple.  No elevated jugular venous pressure, no loud bruits.  LUNGS:  Clear without labored breathing at rest.  CARDIOVASCULAR:  Reveals a regular rate and rhythm, 2/6 systolic murmur  heard at the base, second heart sounds preserved.  No S3 gallop or  pericardial rub.  ABDOMEN:  Soft, nontender.  Normoactive bowel sounds.  EXTREMITIES:  Exhibit no significant pitting edema.  Distal pulses are  2+.  SKIN:  Warm and dry.  MUSCULOSKELETAL:  No kyphosis is noted.  NEUROPSYCHIATRIC:  The patient alert and oriented x3.  Affect is  appropriate.   IMPRESSION/RECOMMENDATION:  1. Coronary disease status post drug-eluting stent placement to left      anterior descending in August of 2006, with otherwise medically      managed 80% distal left anterior descending stenosis.  The patient      is doing quite well on medical therapy and has lost approximately      75 pounds to a combination of diet and exercise.  She ran out of      Plavix approximately 3 weeks ago, and we talked about discontinuing      this medicine, since she is 2-1/2 years out from her drug-eluting  stent.  She has a relatively low risk of late stent thrombosis at      this time.  I explained that continuing her full-dose aspirin is,      however, very important.  I will plan to see her back over the next      6 months for symptom review.  2. Hyperlipidemia, well controlled on simvastatin.  3. Cardiac murmur, likely functional.  It may be that this is more      prominent, now that she has lost so much weight.  We will obtain a      baseline echocardiogram to exclude any significant aortic valve      disease.     Jonelle Sidle, MD  Electronically Signed    SGM/MedQ  DD: 09/11/2007  DT: 09/12/2007  Job #: [id number]

## 2010-12-28 NOTE — Assessment & Plan Note (Signed)
[facility]                            CARDIOLOGY OFFICE NOTE   NAME:[name], [name] MATUSKA                         MRN:          [id number]  DATE:05/06/2008                            DOB:          30-May-1934    PRIMARY CARE PHYSICIAN:  Dr. Lindwood [name]   HISTORY OF PRESENT ILLNESS:  This is a 77 year old with a history of  coronary artery disease status post PCI to the mid LAD who presents to  Cardiology Clinic for her routine followup.  The patient states that she  had been doing well in general until about a week or 10 days ago when  she had a very mild episode of substernal chest tightness that felt like  gas.  This was about 3/10 in intensity on a pain scale.  She had just  finished washing the dishes at the time.  She says that the same, very  mild sensation of chest discomfort reoccurred a couple of days later.  At that time she was doing the laundry.  It was once again also mild  both.  Both episodes resolved very quickly within 1 or 2 minutes.  She  has had no recurrence of this chest pain or pressure over the last week.  Her activity level has been the same.  She does housework.  She climb up  and down stairs and she can walk as far as she wants on flat ground and  with mildest activities over the past week she had a recurrence of chest  pain.  She is able to do all her normal activities without any  significant shortness of breath.  She has no orthopnea or PND.  No  episodes of syncope or palpitations.   SOCIAL HISTORY:  The patient works part-time at a day care.  She is a  nonsmoker.   MEDICATIONS:  1. Telmisartan/HCTZ 80/12.5 mg daily.  2. Toprol-XL 50 mg daily.  3. Zocor 40 mg daily.  4. Aspirin 81 mg daily.  5. Synthroid 75 mcg daily.  6. Imdur 30 mg b.i.d.   PAST MEDICAL HISTORY:  1. Hypercholesterolemia.  2. Coronary artery disease.  The patient had a left heart      catheterization in August 2006 showed an EF of 74%, a 70% mid  LAD      lesion and 80% distal LAD lesion, as well as a 50% ostial to first      diagonal lesion.  The patient did have a drug-eluting stent placed      in her mid LAD at that time.  She was on Plavix for 2-1/2 years.      She stopped Plavix in January 2009.  3. Echocardiogram in January 2009, this showed mild LVH, EF of 55-65%,      moderate left atrial enlargement, mild aortic insufficiency and      aortic sclerosis without aortic stenosis.  EKG today, normal sinus      rhythm.  No ST-T wave changes, no Qs.  There are no recent labs for      my review.   PHYSICAL EXAMINATION:  VITAL SIGNS:  Blood pressure 134/72, heart rate  56 and regular.  GENERAL:  No apparent distress.  She is a well-developed elderly female.  NECK:  No JVD.  No thyromegaly or nodule.  There is no carotid bruit.  LUNGS:  Clear to auscultation bilaterally.  HEART:  Regular S1 and S2.  No S3.  No S4.  There is a 1/6 systolic  crescendo-decrescendo murmur at the right upper sternal border.  ABDOMEN:  Soft, nontender, no hepatosplenomegaly.  EXTREMITIES:  There is no peripheral edema. No clubbing or cyanosis.  NEUROLOGIC:  Alert and oriented x3.  Normal affect.   ASSESSMENT AND PLAN:  This is a 77 year old with history of  hypercholesterolemia and coronary artery disease who presents to  Cardiology Clinic for routine followup.  She does note 2 mild episodes  of what appeared to be anginal-type chest pain about a week or 2 ago.  1. Coronary artery disease.  The patient does have known coronary      artery disease.  She had a drug-eluting stent placed in her mid      left anterior descending (coronary artery) in 2006.  She did have a      residual 80% distal left anterior descending (coronary artery)      lesion.  The patient did have 2 episodes of mild chest pain couple      of weeks ago, one while washing the dishes and one while doing the      laundry, both resolved very quickly.  She has had no recurrence of       that chest pain despite doing similar levels of exertion since      then.  This chest pain does sound like it could be ischemic in      nature.  I talked to the patient for a while about this and we      decided that we will do an exercise treadmill Myoview.  If there is      only a small area of ischemia, I think we can continue to manage      this medically.  However, if there is a larger area of ischemia      that suggests the possibility of restenosis of her mid LAD stent,      then I think we would go forward with a heart catheterization.  She      will continue on her aspirin, her beta-blocker, her ARB, and her      statin.  I have told her instead of taking Imdur 30 mg b.i.d.,      which can lead to less effectiveness of a nitrate without a      significant nitrate-free period, we will have her take Imdur 60 mg      once a day.  2. Hypercholesterolemia.  We have not checked the patient's lipids      lately.  We will have her come back on the day she does the stress      test and we will check her lipids and a complete metabolic panel.     Marca Ancona, MD  Electronically Signed    DM/MedQ  DD: 05/06/2008  DT: 05/06/2008  Job #: [id number]   cc:   Thomas C. Wall, MD, [facility]

## 2010-12-28 NOTE — Assessment & Plan Note (Signed)
[facility]                            CARDIOLOGY OFFICE NOTE   NAME:[name], [name] CHOW                         MRN:          [id number]  DATE:02/13/2007                            DOB:          07-Dec-1933    PRIMARY CARE PHYSICIAN:  Dr. Lindwood Qua.   REASON FOR VISIT:  Cardiac followup.   HISTORY OF PRESENT ILLNESS:  I saw [name] [name] back in January.  She states  that she feels great.  She has been able to lose a significant amount of  weight through exercise and diet.  Her weight is down from 203 to 167.  She states that she is now walking 4 miles most days of the week.  She  is not having any significant angina.  She is very pleased with her  overall progress.  Today, we talked about some medication adjustments,  including cutting her Toprol XL dose back in half, and also changing her  from Vytorin to simvastatin for decreased cost.  She brought in a blood  pressure record from home, showing typically very good blood pressure  control and heart rates in the high 40s to the 60s typically.   ALLERGIES:  No known drug allergies.   PRESENT MEDICATIONS:  1. Micardis hydrochlorothiazide 80/12.5 mg p.o. daily.  2. Imdur 60 mg p.o. q. a.m. and 30 mg p.o. q. p.m.  3. Omeprazole 20 mg p.o. daily.  4. Effexor XR 75 mg p.o. daily.  5. Toprol XL 75 mg p.o. daily.  6. Synthroid 75 mcg p.o. daily.  7. Plavix 75 mg p.o. daily.  8. Trazodone 150 mg p.o. nightly p.r.n.  9. Sublingual nitroglycerin 0.4 mg p.r.n.  10.Vytorin 10/40 mg p.o. daily.  11.Aspirin 81 mg p.o. daily.  12.Plavix 75 mg p.o. daily.  13.Centrum Silver 1 tablet p.o. daily.   REVIEW OF SYSTEMS:  As described in the history of present illness.  Otherwise, negative.   EXAMINATION:  Blood pressure 115/69, heart rate is 55, weight 167  pounds.  The patient is normally-nourished-appearing, in no acute distress.  HEENT:  Normal.  Examination of the neck reveals no elevated jugular venous  pressure or  loud bruits.  No thyromegaly is noted.  LUNGS:  Clear without labored breathing at rest.  CARDIAC:  Regular rate and rhythm.  No loud murmur or S3 gallop.  ABDOMEN:  Soft and nontender.  No bruits.  EXTREMITIES:  No pitting edema.  SKIN:  Warm and dry.  MUSCULOSKELETAL:  No kyphosis is noted.  NEURO/PSYCH:  The patient is alert and oriented x3.  Affect is normal.   IMPRESSION/RECOMMENDATIONS:  1. Coronary artery disease status post drug-eluting stent placement to      the left anterior descending in August of 2006 with medically      managed 80% distal left anterior descending stenosis.  The patient      is doing very well with good symptom control at this time, and has      made substantial improvements in weight with increase in activity      and diet.  We will  cut her Toprol XL dose in half.  I will plan to      see her back over the next 6 months.  2. Hyperlipidemia, presently on Vytorin.  We will change to      simvastatin for cost savings.  I would like a followup with profile      and liver function tests in the next 3 months.  My hope is that she      will attain adequate control on simvastatin alone in the setting of      her diet and weight loss.     Jonelle Sidle, MD  Electronically Signed    SGM/MedQ  DD: 02/13/2007  DT: 02/13/2007  Job #: [id number]   cc:   Wannetta Sender.

## 2010-12-31 NOTE — Consult Note (Signed)
[facility]  Patient:    [name], [name] Visit Number: [id number] MRN: [id number]          Service Type: PMG Location: [facility] Attending Physician:  [name] [name] Dictated by:   [name] [name], D.O. Proc. Date: 10/15/01 Admit Date:  10/12/2001   CC:         [name] [name], M.D.   Consultation Report  Dear Dr. Renae [name]:  Thank you very much for kindly referring Ms. [name] for trial of lumbar epidural steroid injections. [name] [name] was evaluated in our clinic today and underwent an uneventful lumbar epidural steroid injection without complication.  Please refer to the following for details regarding the history and physical examination and treatment [name].  Once again, thank you for allowing [place] to participate in the care of [name] [name].  CHIEF COMPLAINT:  Spinal stenosis and low back pain.  HISTORY OF PRESENT ILLNESS:  [name] [name] is a pleasant 77 year old right-hand dominant female who [facility] complaining if low back pain with occasional pain radiating into the left lateral thigh and leg.  Patient was noted by history to have significant or marked central canal stenosis at L4-5 related to facet arthropathy and a focal leftward disk extrusion on an MRI dated April 21, 2000.  Patient apparently was scheduled to have epidural steroid injections at that time, but was diagnosed with breast cancer and put off epidural steroid injections to take care of her cancer situation.  She underwent right mastectomy.  She has been having worsening back pain over the past several months.  Patient states that pain radiates into the buttocks bilaterally, but again, occasionally down into the left thigh and leg.  She states she gets some relief with leaning forward over a shopping cart at the grocery store.  Her pain is a 5/10 on a subjective scale and described as achy, throbbing,  and burning.  Her function and quality of life indexes have declined.  Her sleep is poor.  She previously took Ambien which helped her but she is concerned about its potential addiction quality.  Her symptoms are made worse with walking and improved with rest.  In addition, she has taken hydrocodone for her back pain but she is currently out of this.  She took it sparingly.  She has taken Vioxx in the past but this was discontinued when she was started on Fosamax.  I review health and history form and 14 point review of systems.  Denies bowel and bladder dysfunction.  PAST MEDICAL HISTORY:  Breast cancer, hypertension.  PAST SURGICAL HISTORY:  Right mastectomy, left ulnar nerve transposition, bilateral carpal tunnel release, plantar fascia release, tonsillectomy, hysterectomy, left knee surgery, right tarsal tunnel release.  FAMILY HISTORY:  Heart disease, cancer, diabetes, disability.  SOCIAL HISTORY:  Denies smoking.  Admits to daily wine consumption.  She is married and does not currently work.  ALLERGIES:  No known drug allergies.  MEDICATIONS:  Nexium, Fosamax, extra strength Tums, and an antihypertensive.  PHYSICAL EXAMINATION  GENERAL:  Healthy female in no acute distress.  VITAL SIGNS:  Blood pressure 189/96, pulse 85, respirations 16, O2 saturation 98%.  BACK:  Level pelvis without scoliosis.  There is mildly increased lumbar lordosis.  There is mild tenderness to palpation bilateral lumbar paraspinals. Flexion and extension are full bilaterally with mild pain.  NEUROLOGIC:  Manual muscle testing is 5/5 bilateral lower extremities. Sensory examination  is intact to light touch bilateral lower extremities at this time.  Muscle stretch reflexes are 2+/4 bilateral patellar, 1+/4 bilateral medial hamstrings and Achilles.  Straight leg raise is negative bilaterally.  Distal pulses are presently bilaterally.  EXTREMITIES:  There is no heat, erythema, or edema in the lower  extremities. LYMPH:  No cervical lymphadenopathy noted.  LABORATORIES:  MRI of the lumbar spine dated April 21, 2000 reveals marked central canal stenosis at the L4-5 related to facet arthropathy and bulging of the disk.  There is also thickening of the ligamentum flavum.  There is question of left focal disk extrusion producing encroachment on the lateral aspect of the spinal canal.  No lytic or blastic lesions were identified.  There is a probable benign hemangioma of the L2 vertebral body on the right.  IMPRESSION: 1. Lumbar spinal stenosis without myelopathy. 2. History of L4-5 disk herniation to the left. 3. History of breast cancer status post mastectomy.  [name]: 1. Had a thorough discussion with [name] [name] regarding her low back pain and    treatment options.  It is reasonable to proceed with a lumbar epidural    steroid injection for overall pain control.  I reviewed the procedure with    her and described it in detail including risks, benefits, limitations, and    alternatives.  Patient wishes to proceed.  Informed consent was obtained.    Patient was brought back to the fluoroscopy suite and placed on the table    in prone position.  Skin was prepped and draped in usual sterile fashion.    Skin and subcutaneous tissue was anesthetized with 3 cc of    preservative-free 1% lidocaine.  Under direct fluoroscopic guidance an    18-gauge 3.5 Tuohy needle was advanced into the left paramedian L5-S1    epidural space with loss of resistance technique.  No CSF, heme, or    paraesthesias were noted.  This was then injected with 1 cc of Kenalog 40    mg/cc plus 1 cc of normal saline with needle flush.  No complications.    Patient tolerated the procedure well.  Discharge instructions given. 2. Will prescribe Pamelor 10 mg one to two p.o. q.h.s. #30 with one refill for    restorative sleep capacity. 3. Samples of Ultracet one to two p.o. t.i.d. p.r.n. for pain #14 samples     given.  4. Patient to return to clinic in two weeks for reevaluation and possible    second lumbar epidural steroid injection as predicated upon patients    response and symptomatology.  Patient was educated on the above findings and recommendations and understands.  There were no barriers to communication. Dictated by:   [name] [name], D.O. Attending Physician:  [name] [name] DD:  10/15/01 TD:  10/16/01 Job: [id number]

## 2010-12-31 NOTE — Cardiovascular Report (Signed)
[name] [name], LISTER                  ACCOUNT NO.:  [id number]   MEDICAL RECORD NO.:  [id number]          PATIENT TYPE:  OIB   LOCATION:  2899                         FACILITY:  [facility]   PHYSICIAN:  Salvadore Farber, M.D. LHCDATE OF BIRTH:  12/31/1933   DATE OF PROCEDURE:  04/13/2005  DATE OF DISCHARGE:                              CARDIAC CATHETERIZATION   PROCEDURE:  Left heart catheterization, left ventriculography, coronary  angiography, drug-eluting stent placement in the mid left anterior  descending.   INDICATIONS:  Ms. Ordoyne is a 77 year old lady with coronary artery disease.  She underwent cardiac catheterization in 2003 demonstrating a 60% stenosis  in the mid LAD. She was managed medically. She now presents with several  months of dyspnea and chest heaviness occurring with exertion and relieved  within 15 minutes of cessation of exertion. Given her known disease she is  referred for diagnostic angiography and possible percutaneous coronary  intervention.   PROCEDURAL TECHNIQUE:  Informed consent was obtained. Under 1% lidocaine  local anesthesia, a 6-French sheath was placed in the right common femoral  arteries using modified Seldinger technique. Diagnostic angiography and left  ventriculography using JL-4, JR-4, and pigtail catheters. Images  demonstrated a 70% stenosis of the mid LAD and an 80% stenosis of the distal  LAD just after the takeoff of the diagonal. The distal LAD is approximately  1.5 mm in diameter even after the administration of intracoronary  nitroglycerin. The territory it supplies is roughly equal in size to that  supplied by the diagonal. There is a 50% stenosis of the LAD just before the  takeoff of the diagonal and extending into the ostium of the diagonal.   Decision was made to proceed to percutaneous revascularization of the mid  LAD 70% lesion with medical therapy of the distal LAD lesion. Films were  reviewed with Dr. Diona Browner who concurred with  this plan.   The patient was loaded with 300 mg of Plavix.  Anticoagulation was initiated  with bivalirudin. ACT was confirmed to be greater than 225 seconds.  A CLS  3.5 guide was advanced over wire and engaged in the ostium of the left main.  A Prowater wire was advanced into the diagonal without difficulty. The mid  LAD lesion was directly stented using a 2.5 x 12-mm Taxus deployed at 18  atmosphere. The stent was then post dilated using a 2.75 x 8-mm Quantum for  three inflations each at 18 atmospheres. Final angiography demonstrated no  residual stenosis, no dissection, and TIMI-III flow to the distal  vasculature. The patient tolerated the procedure well and was transferred to  the holding room in stable condition.   COMPLICATIONS:  None.   FINDINGS:  1.  LV: 189/7/14.  EF 74% without regional wall motion abnormality.  2.  No aortic stenosis or mitral regurgitation.  3.  Left main: Angiographically normal.  4.  LAD: Relatively small vessel which does not reach the apex of the heart.      It gives rise to one moderate size diagonal. The proximal LAD has a 20%  stenosis. The mid LAD has a 70% stenosis and stented to no residual. The      distal LAD has an 80% stenosis just after the takeoff of the diagonal.      The diagonal has a 50% ostial stenosis. The LAD distal to the diagonal      is approximately 1.5 mm even after the administration of intracoronary      nitroglycerin.  5.  Circumflex: Moderate size vessel giving rise to a single obtuse      marginal. It is angiographically normal.  6.  RCA: Large, dominant vessel. It is angiographically normal.   IMPRESSION/RECOMMENDATION:  Successful drug-eluting stent placement in the  lesion of the mid LAD. The distal LAD is small and the lesion in it will be  managed medically. We will add hydrochlorothiazide for blood pressure  improvement. Will switch to long-acting beta blocker and add long-acting  nitrates.      Salvadore Farber, M.D. [facility]  Electronically Signed     WED/MEDQ  D:  04/13/2005  T:  04/13/2005  Job:  [id number]   cc:   Wannetta Sender.  [address]  [place]  [place] [address]  Fax: [phone]   Jonelle Sidle, M.D. [facility]  [address]  [place]  [place] [address]

## 2010-12-31 NOTE — Consult Note (Signed)
[facility]  Patient:    TALLYN, HOLROYD Visit Number: [id number] MRN: [id number]          Service Type: PMG Location: [facility] Attending Physician:  Rolly Salter Dictated by:   Sondra Come, D.O. Admit Date:  10/12/2001   CC:         Jearld Adjutant, M.D.   Consultation Report  HISTORY OF PRESENT ILLNESS:  The patient returns to the clinic today as scheduled for reevaluation and possible second lumbar epidural steroid injection. The patient was seen initially on October 15, 2001, and underwent a lumbar epidural steroid injection for spinal stenosis of her lumbar spine with neurogenic claudication. The patient states that the lumbar epidural steroid injection has given her considerable relief. The patient states that she is able to stand for longer periods of time as well as walking for longer periods of time. Her pain today is a 3/10 on a subjective scale. Her function is essentially the same at this time but seemed to improve somewhat after the last injections. Her sleep is better with Pamelor 10 mg, 2 at bedtime, but she still complains of sleeping only 4 hours per night with this. I review health and history form and 14-point review of systems. No new neurologic complaints.  PHYSICAL EXAMINATION:  GENERAL:  A healthy female in no acute distress.  VITAL SIGNS:  Blood pressure 155/94, pulse 90, respirations 12, O2 saturation 97% on room air.  EXTREMITIES:  Manual muscle testing is 5/5 bilateral lower extremities. Sensory examination is intact to light touch bilateral lower extremities. Muscle stretch reflexes are 2+/4 bilateral patellar, 1+/4 bilateral medial hamstrings and Achilles.  IMPRESSION: 1. Lumbar spinal stenosis with neurogenic claudication. 2. Degenerative disk disease of the lumbar spine with history of    an L4-5 disk herniation.  PLAN: 1. Repeat lumbar epidural steroid injection.  DESCRIPTION OF PROCEDURE:  Informed consent is  obtained. The patient was brought back to the fluoroscopy suite and placed on the table in prone position. Skin was prepped and draped in the usual sterile fashion.   Skin and subcutaneous tissue were anesthetized with 3 cc of preservative-free  1% lidocaine. Under direct fluoroscopic guidance, a 18-gauge 3-1/2 inch Tuohy needle was advanced into the right paramedian L5-S1 epidural space with loss of resistance technique. No CSF, heme, or paresthesia were noted. This was injected with 1 cc of Kenalog 40 mg per cc plus 1 cc of preservative-free normal saline with needle flush. No complications. The patient tolerated the procedure well. Discharge instructions were given.  2. Discontinue Pamelor. 3. Begin Elavil 25 mg 1-2 p.o. q.h.s. #60 without refills. 4. The patient is to return to clinic in 2 weeks for reevaluation    and possible 3rd lumbar epidural steroid injection based on    the patients response and symptoms.  The patient was educated on the above findings and recommendations and understands. There were no barriers to communication. Dictated by:   Sondra Come, D.O. Attending Physician:  Rolly Salter DD:  10/29/01 TD:  10/30/01 Job: [id number]

## 2010-12-31 NOTE — Assessment & Plan Note (Signed)
[facility]                            CARDIOLOGY OFFICE NOTE   NAME:[name], [name] COURTOIS                         MRN:          [id number]  DATE:09/06/2006                            DOB:          [birth year]    PRIMARY CARE PHYSICIAN:  Dr. Lindwood Qua.   REASON FOR VISIT:  Follow-up cardiac testing.   HISTORY OF PRESENT ILLNESS:  I saw [name] [name] back in December.  She had  reported some symptoms suggestive of angina.  I increased her Imdur as  well as scheduled a follow-up Myoview.  The study showed no clear  evidence of scar or ischemia with some mild apical thinning and ejection  fraction of 77% as well as no ST segment changes.  I reviewed these  results with her today.  She states that she has had less frequent  episodes of chest pain, that usually these are very mild.  She has noted  some episodes of fluttering in her chest.  She has had no dizziness or  syncope.  I reviewed her medications, and we discussed advancing her  beta blocker.  She has also had some trouble with insomnia and has been  using her husband's trazodone with benefit.  She asked for a  prescription today.   ALLERGIES:  No known drug allergies.   CURRENT MEDICATIONS:  1. Micardis/HCT 80/12.5 mg p.o. daily.  2. Imdur 60 mg p.o. q.a.m. and 30 mg p.o. q.p.m.  3. Omeprazole 20 mg p.o. daily.  4. Toprol XL 50 mg p.o. daily.  5. Synthroid 75 mcg p.o. daily.  6. Plavix 75 mg p.o. daily.  7. Vytorin 10/40 mg p.o. daily.  8. Potassium 99 mg p.o. daily.  9. Aspirin 81 mg p.o. daily.  10.Centrum Silver p.o. daily.   REVIEW OF SYSTEMS:  As described in the history of present illness.   PHYSICAL EXAMINATION:  VITAL SIGNS:  Blood pressure 174/88, heart rate  81.  Weight is 203 pounds.  GENERAL:  Patient is comfortable in no acute distress.  No major change  in her examination.  NECK:  Supple without elevated jugular venous pressure.  LUNGS:  Clear.  CARDIAC:  Regular rate and rhythm  without murmur, rub or gallop.  EXTREMITIES:  No pitting edema.   IMPRESSION/RECOMMENDATIONS:  1. Coronary artery disease with previous drug-eluting stent placement      in the left anterior descending in August, 2006 with otherwise      medically managed 80% distal left anterior descending disease.      Recent Myoview on medical therapy shows no marked ischemia in the      anterior apical distribution with normal ejection fraction.  I have      asked her to continue the present regimen, except to increase      Toprol XL to 75 mg daily, aiming for 100 mg daily if needed.  I      will otherwise plan to see her back over the next six months.  2. Continue with her followup with Dr. Mikey Bussing.  Further plans to  follow.     Jonelle Sidle, MD  Electronically Signed    SGM/MedQ  DD: 09/06/2006  DT: 09/06/2006  Job #: [id number]   cc:   [name] [name].

## 2010-12-31 NOTE — Discharge Summary (Signed)
NAME:  [name] [name], [name] [name]                              ACCOUNT NO.:  [id number]   MEDICAL RECORD NO.:  [id number]                   PATIENT TYPE:  INP   LOCATION:  4713                                 FACILITY:  [facility]   PHYSICIAN:  Jonelle Sidle, M.D. [facility]        DATE OF BIRTH:  27-Aug-1933   DATE OF ADMISSION:  04/15/2002  DATE OF DISCHARGE:  04/18/2002                                 DISCHARGE SUMMARY   REASON FOR ADMISSION:  Chest pain.   DISCHARGE DIAGNOSES:  1. Chest pain, etiology uncertain, evaluated this admission with cardiac     enzymes and electrocardiogram both negative for ischemic injury, cardiac     catheterization performed September 3, by Dr. Diona Browner revealing moderate     coronary artery disease with a 60% proximal left anterior descending,     normal left ventricular function.  Subsequent Adenosine Cardiolite     performed revealing an ejection fraction of 79% without ischemia or     infarct.  2. Gastroesophageal reflux disease untreated.  3. Coronary risk factors, including family history and unknown lipid status.  4. Chest x-ray abnormality further evaluated with chest CT which was     negative for pulmonary embolism, mediastinal or lung mass, ascending     aorta is 4 cm.  5. Fatigue, overweight, and deconditioning, treated this admission with     extensive counseling about the benefits of exercise.  6. Hypertension presently marginal control.  7. History of breast cancer, status post mastectomy in September of 2001,     negative nodes by report.  8. History of cholelithiasis.   HISTORY OF PRESENT ILLNESS:  This delightful 77 year old lady with history  as outlined above who has had intermittent episodes of chest pain over six  weeks prior to evaluation.  This pain begins substernally and radiates to  the shoulders and neck and there is associated diaphoresis and dyspnea.  The  episodes occur predominantly with exertion.  The episode has precipitated  her  arrival to the ED was experienced during a shopping trip in town.  She  was walking with her daughter and experienced a sudden 10 out of 10  intensity chest pressure with significant diaphoresis.  This episode lasted  45 minutes.  Her husband's nitroglycerin was taken, but it did not  completely relieve the pain.  She had earlier been evaluated by her primary  care Boy Delamater and had already had an appointment for a stress test this  week.  Given all of these factors, we decided to admit her for further  evaluation.   HOSPITAL COURSE:  The patient was admitted and continued on her home  medications.  We added aspirin and low dose beta blocker to the patient's  regimen.  IV heparin was initiated as was IV nitroglycerin for pain.  Chest  x-ray was obtained and this revealed an opacity in the medial right apex,  question ectatic vasculature  with suggested follow-up with a chest CT.  This  was performed and the results are outlined above.   The patient was taken to catheterization two days following admission by Dr.  Diona Browner.  Findings were left main with no flow-limiting CAD, LAD 60%  proximal and 30% mid vessel lesions, circumflex no flow-limiting CAD, RCA no  flow-limiting CAD, no mitral regurgitation or wall motion abnormality was  seen and ejection fraction is normal at 55 to 65%.  To further evaluate the  significance of this LAD lesion, an Adenosine Cardiolite was performed the  day of discharge.  Findings were ejection fraction of 78%, no infarct or  ischemia.   The patient tolerated all procedures well and was free of chest pain on the  day of discharge.  She was seen by Dr. Daleen Squibb and felt to be in stable  condition.  On physical examination, the patient offers no complaints of  chest pain.  She slept well.  Objective; 155/85, 62, 20, 98.5, 93% on room  air.  Telemetry; normal sinus rhythm. General NAD.  CV regular rate and  rhythm, there is an S4.  Lungs clear to auscultation  bilaterally.  Extremities without cyanosis, clubbing, or edema.  Right femoral groin stick  site is benign, no evidence of hematoma or bruit.   DISCHARGE LABORATORY DATA:  Chemistry; sodium 137, potassium 3.6, BUN 9,  creatinine 0.7, glucose 113.  Discharge WBC 7.3, hemoglobin 14.6, hematocrit  42.0, platelets 252.  12-lead EKG at discharge shows a normal sinus rhythm  with a rate of 62.  Troponin I was negative x3.  No ischemic ST morphology.  Fasting lipid panel was pending at time of discharge due to some lab delays.   DISPOSITION:  The patient is discharged to home in the care of her very  supportive husband on the following medications.   DISCHARGE MEDICATIONS:  1. Aspirin 325 mg one q.d..  2. Lopressor 25 mg 1/2 tablet b.i.d.  3. Diovan 80 mg one a day.  This does represent a new dose and the patient     does have a supply.  4. Vioxx as before.  5. Nitroglycerin 0.4 one sublingual q.5 minutes x3 p.r.n. chest pain.  6. The patient is urged to use Pepcid A.C. over-the-counter at the highest     possible doses with food to control her reflux disease.  Her husband has     a supply of Aciphex which he agrees to share with her.  She declines     prescription for proton pump inhibition since it is too expensive.   ACTIVITY:  The patient agrees to refrain from driving, all sexual activity,  heavy lifting x2 days.  Much discussion is devoted to the benefits of  regular daily exercise and she agrees to try to exercise by walking 30  minutes a day.   DIET:  Heart healthy, low fat, low cholesterol, low calorie diet.  The  patient knows that weight loss would be a goal to reduce her cardiac risks.   WOUND CARE:  The patient agrees to call the office if her groin becomes hard  or painful and she will follow up with Ms. Brewer about reflux medicine,  other lifestyle interventions, and a possible endoscopy to evaluate her reflux disease.  She has follow-up with Dian Queen, P.A. at  [facility] on Friday, September 19, at 10 o'clock and knows to call in the  interim with any problems, questions, or concerns, or change or increase  in  symptoms.      Pennelope Bracken, P.A. [facility]                   Jonelle Sidle, M.D. [facility]    LK/MEDQ  D:  04/18/2002  T:  04/18/2002  Job:  [id number]   cc:   Dian Queen, P.A.   Genene Churn. Cyndie Chime, M.D.  [address] [facility]  [place]  [place] [address]  Fax: [phone]   Tommas Olp, N.P., [facility]

## 2010-12-31 NOTE — Assessment & Plan Note (Signed)
[facility]                            CARDIOLOGY OFFICE NOTE   NAME:[name], [name]                         MRN:          [id number]  DATE:07/17/2006                            DOB:          1934/06/10    PRIMARY CARE PHYSICIAN:  Dr. Lindwood Qua.   REASON FOR VISIT:  Followup coronary artery disease.   HISTORY OF PRESENT ILLNESS:  I saw [name] [name] last in June. She has a  history of coronary artery disease status post drug-eluting stent  placement to the left anterior descending in August 2006 with medically  managed distal 80% left anterior descending disease. She has had  episodes of recurrent chest pain suggestive of angina but has been using  sublingual nitroglycerin with relief. She did have a followup myocardial  perfusion study after her intervention that did not show frank ischemia  and we have been trying to manage her medically. Her electrocardiogram  today showed sinus rhythm with ectopic beats. There are no marked  progressive ST segment changes compared to her prior tracing in August  2006. Today we talked about advancing her baseline Imdur dose and a  followup Myoview on medical therapy.   ALLERGIES:  No known drug allergies.   CURRENT MEDICATIONS:  1. Micardis 80/12.5 mg p.o. daily.  2. Venlafaxine 75 mg p.o. daily.  3. Vitamin B6 and magnesium supplement.  4. Toprol XL 50 mg p.o. daily.  5. Synthroid 75 mcg p.o. daily.  6. Plavix 75 mg p.o. daily.  7. Vytorin 10/40 mg p.o. daily.  8. Magnesium supplements.  9. Imdur 30 mg p.o. b.i.d.  10.Potassium supplements.  11.Aspirin 81 mg p.o. daily.  12.Centrum Silver 1 p.o. daily.   REVIEW OF SYSTEMS:  As described in the history of present illness.   PHYSICAL EXAMINATION:  VITAL SIGNS:  Weight is 201 pounds, blood  pressure is 140/76, heart rate is 61.  GENERAL:  The patient is comfortable in no acute distress.  NECK:  Examination of the neck reveals no elevated jugular venous  pressure, no bruits.  LUNGS:  Clear without labored breathing at rest.  CARDIAC:  Reveals a regular rate and rhythm without murmur or gallop.  ABDOMEN:  Soft, no hepatomegaly, no bruits.  EXTREMITIES:  No significant pitting edema.   IMPRESSION/RECOMMENDATIONS:  1. Coronary artery disease status post drug-eluting stent placement to      the left anterior descending in August 2006 with medically managed      80% distal left anterior descending disease. She has had recurrent      angina which we have been trying to manage medically and this has      progressed somewhat since her last visit. I have asked her to      increase her Imdur to 60 mg in the morning and 30 mg in the evening      and I will plan a followup adenosine Myoview and medical therapy.      We will review the results with her in the office and we can decide      where to proceed  from there.  2. Hyperlipidemia on Vytorin. Will plan a followup fasting lipid      profile and liver function tests when she returns.     [name] Sidle, MD  Electronically Signed    SGM/MedQ  DD: 07/17/2006  DT: 07/18/2006  Job #: [id number]   cc:   Wannetta Sender.

## 2010-12-31 NOTE — Cardiovascular Report (Signed)
NAME:  [name] [name], [name] [name]                              ACCOUNT NO.:  [id number]   MEDICAL RECORD NO.:  [id number]                   PATIENT TYPE:  INP   LOCATION:  4713                                 FACILITY:  [facility]   PHYSICIAN:  Jonelle Sidle, M.D. [facility]        DATE OF BIRTH:  [birth year]   DATE OF PROCEDURE:  04/17/2002  DATE OF DISCHARGE:  04/18/2002                              CARDIAC CATHETERIZATION   PRIMARY CARE PHYSICIAN:  Lindwood Qua, M.D.   CARDIOLOGIST:  Jonelle Sidle, M.D.   INDICATIONS:  The patient is a 77 year old woman with a past medical history  of hypertension, who presents with a six-week history of intermittent  progressive chest pressure. She is ruled out for myocardial infarction and  is referred for cardiac catheterization to clearly define coronary anatomy  and left ventricular performance.   PROCEDURES PERFORMED:  1. Left heart catheterization.  2. Selective coronary angiography.  3. Left ventriculography.   ACCESS AND EQUIPMENT:  The area about the right femoral artery was  anesthetized with 1% lidocaine and a 6 French sheath was placed in the right  femoral artery via the modified Seldinger technique. Standard preformed 6  [place] and JR4 catheters were used for selective coronary angiography,  and a 6 French angled pigtail catheter was used for left heart  catheterization and left ventriculography.  All exchanges were made over a  wire, and the patient tolerated the procedure well without immediate  complications.   HEMODYNAMICS:  1. Left ventricle 185/16.  2. Aorta 182/82.   ANGIOGRAPHIC FINDINGS:  1. Left main coronary artery is free of significant flow-limiting coronary     atherosclerosis.  2. Left anterior descending is a medium caliber vessel with essentially one     large diagonal branch. There is approximately 60% stenosis in the     proximal left anterior descending with 30% more diffuse mid vessel     stenosis.  3. The  circumflex coronary artery has a small AV groove branch with a very     large trifurcating obtuse marginal branch supplying the lateral wall. No     significant flow-limiting coronary atherosclerosis is noted.  4. The right coronary artery is dominant and large. There is no significant     flow-limiting coronary atherosclerosis noted.   LEFT VENTRICULOGRAPHY:  Left ventriculography was performed in the RAO  projection and revealed an ejection fraction of 55-60% without focal wall  motion abnormalities and no significant mitral regurgitation.   DIAGNOSES:  1. Moderate coronary atherosclerosis with a 60% proximal left anterior     descending stenosis of blood dyscrasia significance angiographically.  2. Left ventricular ejection fraction of 55-60%.   RECOMMENDATIONS:  Will continue medical therapy and proceed with an  adenosine Cardiolite tomorrow to assess for potential ischemia in the  anterior wall. If so, would suggest that percutaneous intervention should be  considered for the left anterior descending  stenosis.                                                     Jonelle Sidle, M.D. [facility]    SGM/MEDQ  D:  04/17/2002  T:  04/19/2002  Job:  [id number]   cc:   Wannetta Sender.

## 2010-12-31 NOTE — Consult Note (Signed)
[facility]  Patient:    [name] [name], [name] [name] Visit Number: [id number] MRN: [id number]          Service Type: PMG Location: TPC Attending Physician:  Sondra Come Dictated by:   Sondra Come, D.O. Proc. Date: 02/06/02 Admit Date:  01/14/2002   CC:         Jearld Adjutant, M.D.   Consultation Report  Ms. Bais returns to clinic today as scheduled for reevaluation.  She was last seen on January 16, 2002 at which time she underwent a lumbar epidural steroid injection for spinal stenosis of the lumbar spine with neurogenic claudication.  This was her third lumbar epidural steroid injection since March 2003.  Currently, her pain is a 2/10 on a subjective scale.  She states that she is doing very well post injection.  In the interim she has gone on a two week cruise and land tour of [place] which she states she tolerated very well, although she does get some discomfort in her low back from time to time with prolonged standing and walking.  Overall, her function and quality of life indexes have improved significantly.  She continues on Elavil at bedtime as needed.  She also continues to take Vioxx 25 mg as needed.  She has a follow-up appointment with her referring Norie Latendresse, Dr. Renae Fickle, in one to two weeks.  PHYSICAL EXAMINATION  GENERAL:  Healthy female in no acute distress.  VITAL SIGNS:  Blood pressure 162/86, pulse 81, respirations 16, O2 saturation 95% on room air.  BACK:  Level pelvis without scoliosis.  There is minimal tenderness to palpation bilateral lumbar paraspinals.  NEUROLOGIC:  Manual muscle testing is 5/5 bilateral lower extremities. Sensory examination is intact to light touch bilateral lower extremities. Muscle stretch reflexes are 2+/4 bilateral patellar, 1+/4 bilateral medial hamstrings, and 0/4 bilateral Achilles.  No hypertonicity noted in the lower extremities.  IMPRESSION: 1. Spinal stenosis of the lumbar spine with improved neurogenic  claudication. 2. Degenerative disk disease of the lumbar spine. 3. Nonrestorative sleep disorder.  PLAN: 1. The patient is to continue current medications and follow up with her    referring Harless Molinari, Dr. Renae Fickle. 2. Would encourage patient to begin a low to non-impact aerobic exercise    program as well as a lumbar stabilization exercise program.  Would consider    a referral to physical therapy for instruction in this regard. 3. The patient is to return to clinic per Dr. Renae Fickle.  The patient was educated on the above findings and recommendations and understands.  No barriers to communication. Dictated by:   Sondra Come, D.O. Attending Physician:  Sondra Come DD:  02/06/02 TD:  02/07/02 Job: [id number]

## 2010-12-31 NOTE — Consult Note (Signed)
[facility]  Patient:    [name] [name], [name] [name] Visit Number: [id number] MRN: [id number]          Service Type: PMG Location: TPC Attending Physician:  Rolly Salter Dictated by:   Sondra Come, D.O. Proc. Date: 11/12/01 Admit Date:  10/12/2001   CC:         Jearld Adjutant, M.D.   Consultation Report  HISTORY:  The patient returns to clinic today as scheduled for reevaluation. She is status post two lumbar epidural steroid injections for spinal stenosis of the lumbar spine with neurogenic claudication.  The patient states that her pain has significantly improved following the second injection.  Her pain now is 1/10 on a subjective scale.  Her function and quality of life indices have improved significantly.  Her sleep is great.  She continues to use Elavil 25 mg, 1-2 at bed time.  I reviewed the health and history form and 14-point review of systems.  No new neurologic complaints.  She denies bowel or bladder dysfunction.  PHYSICAL EXAMINATION:  GENERAL:  Healthy female in no acute distress.  VITAL SIGNS:  Blood pressure 164/89, pulse 86, respirations 16. O2 saturation 95% on room air.  BACK:  Minimal tenderness to palpation of the lumbar paraspinals.  NEUROLOGIC:  No new neurologic findings in the lower extremities including motor, sensory and reflexes.  IMPRESSION: 1. Lumbar spinal stenosis with neurogenic claudication, significantly    improved. 2. Degenerative disk disease of the lumbar spine.  PLAN: 1. I had a thorough discussion with the patient regarding treatment options at    this point.  Given the patients significant improvement and minimal pain    (if any) and significantly improved function and quality of life indices, I    think it is reasonable to forgo a third lumbar epidural steroid injection    at this time.  We do have this at our disposal if the patients symptoms    worsen. 2. The patient was encouraged to continue with her  active life style, but I    counseled her on the importance of proper body mechanics when lifting,    pushing or pulling.  She should continue with a home exercise program. 3. The patient is to return to clinic as needed.  The patient was educated on the above findings and recommendations and understands.  There were no barriers to communication. Dictated by:   Sondra Come, D.O. Attending Physician:  Rolly Salter DD:  11/12/01 TD:  11/12/01 Job: [id number]

## 2010-12-31 NOTE — Consult Note (Signed)
[facility]  Patient:    [name] [name], [name] [name] Visit Number: [id number] MRN: [id number]          Service Type: PMG Location: [facility] Attending Physician:  Sondra Come Dictated by:   Sondra Come, D.O. Proc. Date: 01/16/02 Admit Date:  01/14/2002   CC:         Jearld Adjutant, M.D.   Consultation Report  [name] [name] returns to clinic today for reevaluation and possible repeat lumbar epidural steroid injection.  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  She has undergone two lumbar epidural steroid injections in March 2003 with essentially complete relief of her back pain and lower extremity radicular symptoms.  She also noted significant improvement in her function and quality of life indexes at the time.  We had foregone a third lumbar epidural steroid injection at the time given her significant improvement.  Her symptoms have slowly return towards baseline and she returns today requesting the third lumbar epidural steroid injection.  Her pain today is a 6/10 on a subjective scale involving her low back and radiating into her bilateral buttocks.  Her function and quality of life indexes have declined somewhat over the past few weeks.  I review health and history form and 14 point review of systems.  Patient denies bowel and bladder dysfunction, lower extremity numbness or paraesthesias.  PHYSICAL EXAMINATION  GENERAL:  Healthy female in no acute distress.  VITAL SIGNS:  Blood pressure 158/76, pulse 82, respirations 12, O2 saturation 97% on room air.  NEUROLOGIC:  Manual muscle testing is 5/5 bilateral lower extremities. Sensory examination is intact to light touch bilateral lower extremities. Muscle stretch reflexes are symmetric bilateral lower extremities.  IMPRESSION: 1. Lumbar spinal stenosis with neurogenic claudication. 2. Degenerative disk disease of the lumbar spine. 3. Nonrestorative sleep disorder,  improved with Elavil.  PLAN: 1. It is reasonable to proceed with a repeat lumbar epidural steroid injection    based on the patients previous response. 2. Will renew Elavil 25 mg one to two p.o. q.h.s. as needed for sleep #60 with    two refills. 3. Patient to return to clinic in three weeks for reevaluation.  PROCEDURE:  Lumbar epidural steroid injections.  Risks, benefits, limitations, and alternatives were described.  Patient wished to proceed.  Informed consent was obtained.  Patient was brought back to the fluoroscopy suite and placed on the table in prone position.  The skin was prepped and draped in the usual sterile fashion.  Skin and subcutaneous tissues were anesthetized with 3 cc of preservative-free 1% lidocaine.  Under direct fluoroscopic guidance an 18-gauge 3.5 inch Tuohy needle as advanced into the left paramedian L5-S1 epidural space with loss of resistance technique.  No CSF, heme, or paraesthesias were noted.  This was then injected with 1.5 cc of Kenalog 40 mg/cc plus 1.5 cc of normal saline with needle flush.  There were no complications.  Patient tolerated the procedure well.  Discharge instructions were given.  Patient was educated on the above findings and recommendations and understands.  There were no barriers to communication. Dictated by:   Sondra Come, D.O. Attending Physician:  Sondra Come DD:  01/16/02 TD:  01/18/02 Job: [id number]

## 2010-12-31 NOTE — H&P (Signed)
NAME:  [name] [name], [name] [name]                              ACCOUNT NO.:  [id number]   MEDICAL RECORD NO.:  [id number]                   PATIENT TYPE:  INP   LOCATION:  2912                                 FACILITY:  [facility]   PHYSICIAN:  Jonelle Sidle, M.D. [facility]        DATE OF BIRTH:  [birth year]   DATE OF ADMISSION:  04/15/2002  DATE OF DISCHARGE:                                HISTORY & PHYSICAL   CHIEF COMPLAINT:  Chest pain.   HISTORY OF PRESENT ILLNESS:  Ms. Leiphart is a 77 year old woman with a history  of hypertension who presents with a six-week history of intermittent chest  pressure that began substernally, and radiates to the shoulders and neck,  and is typically associated with diaphoresis and dyspnea.  These symptoms  occur predominantly with exertion, but have not occurred at rest.  Today,  she was shopping here in town and developed her most intense episode yet,  described as a 10 out of 10 intensity chest pressure with significant  diaphoresis.  These symptoms lasted approximately 45 minutes and were  somewhat responsive to nitroglycerin, although not completely.  At present,  she is pain free, but has had some intermittent discomfort at rest during  her stay in the emergency department.  She was evaluated by her primary care  physician and actually already arranged to have some type of stress test  this week back in [facility].  She has no known history of coronary artery  disease or myocardial infarction.   ALLERGIES:  No known drug allergies.   CURRENT MEDICATIONS:  1. Diovan 160 mg p.o. q.d.  2. Centrum Silver 1 p.o. q.d.   PAST MEDICAL HISTORY:  1. Hypertension.  2. No known history of dyslipidemia or type 2 diabetes mellitus.  3. Status post hysterectomy.  4. Status post appendectomy.  5. History of cholelithiasis.  6. History of breast cancer status post mastectomy in September 2001, with     negative nodes by report.   SOCIAL HISTORY:  The patient lives in [place] and is retired.  She denies  tobacco use and drinks wine rarely.  She is married for the second time.   FAMILY HISTORY:  Significant for coronary artery disease, particularly in  her brothers in the ages of 67s to 24s.   REVIEW OF SYSTEMS:  As described in history of present illness.  The patient  complains of some allergic rhinitis.  Otherwise, review of systems is  negative.   PHYSICAL EXAMINATION:  VITAL SIGNS:  Temperature 98.6 degrees, heart rate 74  and regular, respirations 16, blood pressure 150/80.  GENERAL:  This is a well-nourished woman seated approximately 60 degrees in  no acute distress.  HEENT:  Conjunctivae and lids are normal.  Oropharynx is clear.  NECK:  Supple without elevated jugular venous pressure or carotid bruits.  No thyromegaly is noted.  LUNGS:  Clear to auscultation bilaterally  with normal respiratory effort at  rest.  CARDIAC:  Reveals a regular rate and rhythm with an S4, but no S3 gallop.  No significant murmur or rub is noted.  ABDOMEN:  Obese, nontender without hepatomegaly or bruits.  EXTREMITIES:  No clubbing, cyanosis, or edema.  Peripheral pulses are 2+.  SKIN:  No ulcerative changes are noted.  MUSCULOSKELETAL:  No kyphosis is noted.  NEUROPSYCHIATRIC:  The patient is alert and oriented x3.  Affect is normal.   LABORATORY DATA:  Total CK 87.  Troponin I 0.03.  Potassium 3.7, BUN and  creatinine 13/0.6, respectively.  Hemoglobin 14.3, platelets 253.  A 12-lead  electrocardiogram shows normal sinus rhythm with nonspecific ST-T wave  changes.  The QT interval is mildly prolonged on the initial tracing.  T-  waves are somewhat more prominent on the followup tracing three hours later.   IMPRESSION/PLAN:  1. Symptoms suggestive of unstable angina in a 77 year old woman with long-     standing hypertension, but no other history of coronary artery disease,     type 2 diabetes mellitus, or dyslipidemia.  She had a more prolonged and     intense  episode today while shopping.  Initial electrocardiogram is     nonspecific and initial cardiac enzymes are negative.  We will admit the     patient and plan cardiac catheterization to clearly define the coronary     anatomy and left ventricular performance.  I discussed this with the     patient and her family and they are willing to proceed.  We will treat     the patient with aspirin, heparin, and beta blocker, and follow up     cardiac enzymes.  2. Unknown lipid status.  We will check a fasting lipid profile.  3. Hypertension, not optimally controlled at this time.  We will add beta     blocker  for the time being.                                                    Jonelle Sidle, M.D. [facility]    SGM/MEDQ  D:  04/15/2002  T:  04/16/2002  Job:  [id number]

## 2010-12-31 NOTE — Discharge Summary (Signed)
[name] [name], [name] [name]                  ACCOUNT NO.:  [id number]   MEDICAL RECORD NO.:  [id number]          PATIENT TYPE:  OIB   LOCATION:  6529                         FACILITY:  [facility]   PHYSICIAN:  Jonelle Sidle, M.D. LHCDATE OF BIRTH:  31-Jan-1934   DATE OF ADMISSION:  04/13/2005  DATE OF DISCHARGE:  04/15/2005                                 DISCHARGE SUMMARY   Primary care physician is Dr. Lindwood Qua.  Primary cardiologist is Dr.  Nona Dell.   [name] [name] is a 77 year old female who was evaluated by Dr. Diona Browner on April 08, 2005, in the office for recurring chest discomfort over the preceding  several months.  She described this as a feeling of burning discomfort  beginning in the midsternal area, radiating up to the neck __________ and  face, associated with diaphoresis.  It is unclear if nitroglycerin affects  her discomfort.   After evaluation, Dr. Diona Browner scheduled admission for a cardiac  catheterization.   Her history is also notable for hypertension, hyperlipidemia,  catheterization in September 2003, which showed a 60% LAD, 30% mid-LAD, EF  of 55-60%.  An inpatient Cardiolite at that time did not show any evidence  of ischemia, was managed medically.  She also has a history of esophagitis  by endoscopy in October 2005.   LABORATORY DATA:  Chest x-ray on August 30 showed mild left ventricular  enlargement, no acute disease.  Preadmission labs shows an H&H of 14.1 and  39.5, normal indices, platelets 293, WBC 5.5.  Prior to discharge, H&H was  12.3 and 35.2, normal indices, platelets 232, WBC 6.2.  PTT 26.1, PT 11.7.  Sodium 140, potassium 3.9, BUN 10, creatinine 0.8, glucose 88.  Post-  catheterization BUN and creatinine were 8 and 0.8, potassium 3.7, glucose  102.  Postprocedure CK, MB and troponin were negative.  Preadmission EKG  showed sinus bradycardia with a ventricular rate of 53, early R-wave,  nonspecific changes.  Subsequent EKGs did not show any  changes.   HOSPITAL COURSE:  [name] [name] underwent cardiac catheterization on April 13, 2005, by Dr. Samule Ohm.  According to his progress note, her EF was 74% without  wall motion abnormalities or mitral regurgitation.  A proximal 70% LAD was  reduced to 0% utilizing a Taxus stent.  There was a distal 80% LAD, which  Dr. Samule Ohm felt that we should manage conservatively.  Hydrochlorothiazide  was added for blood pressure control.  Long-acting beta blocker and Imdur  was also added to her medical regimen.  Cardiac rehab assisted with  ambulation and education.  On April 14, 2005, [name] [name] had not ambulated and  she was complaining of mild right groin discomfort.  Dr. Diona Browner kept her  overnight to have her continue ambulating and to assure that her  catheterization site remained stable and to follow up on a CBC.  By the  morning of September 1, she was ambulating without difficulty, the  catheterization site was intact, and Dr. Diona Browner felt that she could be  discharged home.   DISCHARGE DIAGNOSES:  1.  Unstable  angina.  2.  Coronary artery disease, status post Taxus stenting to the mid-LAD as      previously described.  3.  Residual coronary artery disease with normal left ventricular function,      to be managed medically.  4.  History of hyperlipidemia.  5.  Hypertension.  6.  Esophagitis.   DISPOSITION:  [name] [name] is discharged home.  Her new medications include  hydrochlorothiazide 12.5 mg daily, Imdur 30 mg daily, nitroglycerin 0.4 mg  p.r.n.  She was given permission to continue her aspirin 325 mg daily,  Toprol XL 50 mg daily, Diovan 160 mg daily, Synthroid 50 mcg daily, Lexapro  10 mg b.i.d., Nexium 40 mg daily, Zocor 40 mg q.h.s., multivitamin daily,  and over-the-counter magnesium and potassium supplementation as previously.  She was advised a low salt, fat, cholesterol diet.  Her activities and  catheterization site care are depicted in the discharge instructions post   cardiac catheterization.  She was asked to bring all medicines to all  appointments.  She will see Dr. Ival Bible physician assistant on April 29, 2005, at 11 a.m.  At the time of follow-up, review of continued cardiac  risk factor modification should be pursued as well as enrollment in cardiac  rehab.      Joellyn Rued, P.A. [facility]    ______________________________  Jonelle Sidle, M.D. [facility]    EW/MEDQ  D:  04/15/2005  T:  04/15/2005  Job:  [id number]   cc:   Wannetta Sender.  [address]  [place]  [place] [address]  Fax: [phone]

## 2011-01-15 ENCOUNTER — Other Ambulatory Visit: Payer: Self-pay | Admitting: Cardiology

## 2011-01-17 ENCOUNTER — Other Ambulatory Visit: Payer: Self-pay | Admitting: *Deleted

## 2011-03-29 ENCOUNTER — Encounter (INDEPENDENT_AMBULATORY_CARE_PROVIDER_SITE_OTHER): Payer: Medicare HMO | Admitting: Ophthalmology

## 2011-03-29 DIAGNOSIS — H43819 Vitreous degeneration, unspecified eye: Secondary | ICD-10-CM

## 2011-03-29 DIAGNOSIS — H33009 Unspecified retinal detachment with retinal break, unspecified eye: Secondary | ICD-10-CM

## 2011-03-29 DIAGNOSIS — H35359 Cystoid macular degeneration, unspecified eye: Secondary | ICD-10-CM

## 2011-04-26 ENCOUNTER — Encounter (INDEPENDENT_AMBULATORY_CARE_PROVIDER_SITE_OTHER): Payer: Medicare HMO | Admitting: Ophthalmology

## 2011-04-26 DIAGNOSIS — H33009 Unspecified retinal detachment with retinal break, unspecified eye: Secondary | ICD-10-CM

## 2011-04-26 DIAGNOSIS — H35359 Cystoid macular degeneration, unspecified eye: Secondary | ICD-10-CM

## 2011-04-26 DIAGNOSIS — H43819 Vitreous degeneration, unspecified eye: Secondary | ICD-10-CM

## 2011-04-26 DIAGNOSIS — H3530 Unspecified macular degeneration: Secondary | ICD-10-CM

## 2011-05-13 ENCOUNTER — Encounter: Payer: Self-pay | Admitting: Cardiology

## 2011-05-16 ENCOUNTER — Other Ambulatory Visit: Payer: Self-pay | Admitting: *Deleted

## 2011-05-16 ENCOUNTER — Encounter: Payer: Self-pay | Admitting: Cardiology

## 2011-05-16 ENCOUNTER — Ambulatory Visit (INDEPENDENT_AMBULATORY_CARE_PROVIDER_SITE_OTHER): Payer: Medicare HMO | Admitting: Cardiology

## 2011-05-16 DIAGNOSIS — E785 Hyperlipidemia, unspecified: Secondary | ICD-10-CM

## 2011-05-16 DIAGNOSIS — I2510 Atherosclerotic heart disease of native coronary artery without angina pectoris: Secondary | ICD-10-CM

## 2011-05-16 DIAGNOSIS — I10 Essential (primary) hypertension: Secondary | ICD-10-CM

## 2011-05-16 LAB — HEPATIC FUNCTION PANEL
ALT: 18 U/L (ref 0–35)
AST: 23 U/L (ref 0–37)
Albumin: 4.4 g/dL (ref 3.5–5.2)
Alkaline Phosphatase: 68 U/L (ref 39–117)
Bilirubin, Direct: 0.1 mg/dL (ref 0.0–0.3)
Total Bilirubin: 0.7 mg/dL (ref 0.3–1.2)
Total Protein: 7.4 g/dL (ref 6.0–8.3)

## 2011-05-16 LAB — LIPID PANEL
Cholesterol: 131 mg/dL (ref 0–200)
HDL: 66.5 mg/dL (ref >39.00)
LDL Cholesterol: 50 mg/dL (ref 0–99)
Total CHOL/HDL Ratio: 2
Triglycerides: 74 mg/dL (ref 0.0–149.0)
VLDL: 14.8 mg/dL (ref 0.0–40.0)

## 2011-05-16 MED ORDER — LOSARTAN POTASSIUM 100 MG PO TABS: 100.0000 mg | ORAL_TABLET | Freq: Every day | ORAL | Status: DC

## 2011-05-16 MED ORDER — HYDROCHLOROTHIAZIDE 12.5 MG PO CAPS: 12.5000 mg | ORAL_CAPSULE | Freq: Every day | ORAL | Status: DC

## 2011-05-16 NOTE — Patient Instructions (Signed)
Stop micardis.  Start losartan 100mg  daily.  Start hydrochlorothiazide 12.5mg  daily.  Lab is 2 weeks--you have the order--BMET 414.01  401.9  Take and record your blood pressure. I will call you in 2 weeks to get the readings. Luana Shu [phone]  Your physician recommends that you have  a FASTING lipid profile/liver profile today 414.01  401.9  Your physician wants you to follow-up in: 1 year with Dr Shirlee Latch.(October 2013).You will receive a reminder letter in the mail two months in advance. If you don't receive a letter, please call our office to schedule the follow-up appointment.

## 2011-05-16 NOTE — Progress Notes (Signed)
PCP: Dr. Mikey Bussing [facility])  77 yo with h/o CAD s/p PCI to LAD presents for cardiology followup.  Given chest pain and shortness of breath, she had a left heart cath in 11/10 that showed a patent proximal LAD stent and 50% mid LAD stenosis, similar to prior cath (2006).  Since I last saw her, she has been doing well from a cardiac standpoint.  She denies exertional dyspnea or chest pain.  She walks for exercise and is able to do housework and yardwork without problem.  No lightheadedness or syncope.  Main complaint today is that her Micardis/HCT and Lipitor are too expensive.    ECG: NSR, normal  Labs (7/10): HDL 72, LDL 59, LFTs normal  Allergies (verified):  No Known Drug Allergies  Past Medical History: 1. Hypercholesterolemia. 2. Coronary artery disease.  The patient had a left heart catheterization in August 2006 showed an EF of 70%, a 70% mid LAD lesion and 80% distal LAD lesion, as well as a 50% ostial first diagonal lesion.  The patient did have a drug-eluting stent placed in her mid LAD at that time.  She was on Plavix for 2-1/2 years. She stopped Plavix in January 2009.  Adenosine myoview (9/09):  EF 69% with normal perfusion images suggesting no ischemia or infarction.  Repeat LHC for CP (11/10) showed patent prox LAD stent, 50% mLAD stenosis.  Study was similar to 2006.  3. Diastolic CHF: Echo (11/10) with EF 55-60%, moderate diastolic dysfunction, no regional WMAs, mild to moderate TR, moderate LAE, mild AI.  4. Hypertension. 5. Hypothyroidism.  6. Breast CA: treated with mastectomy/tamoxifen in early 2000s.    Family History: Noncontributory.   Social History: The patient works part-time at a day care.  She is a nonsmoker.She lives in [place]. She is married.   ROS: All systems reviewed and negative except as per HPI.   Current Outpatient Prescriptions  Medication Sig Dispense Refill  . aspirin 81 MG tablet Take 81 mg by mouth daily.        Marland Kitchen atorvastatin (LIPITOR)  80 MG tablet Take 80 mg by mouth daily.        . isosorbide mononitrate (IMDUR) 120 MG 24 hr tablet Take 1 tablet by mouth Daily.      Marland Kitchen levothyroxine (SYNTHROID, LEVOTHROID) 75 MCG tablet Take 75 mcg by mouth daily.        . metoprolol (TOPROL-XL) 50 MG 24 hr tablet Take 50 mg by mouth daily.        . traZODone (DESYREL) 50 MG tablet Take 150 mg by mouth at bedtime.        Marland Kitchen venlafaxine (EFFEXOR) 75 MG tablet Take 75 mg by mouth 2 (two) times daily.        . hydrochlorothiazide (MICROZIDE) 12.5 MG capsule Take 1 capsule (12.5 mg total) by mouth daily.  90 capsule  3  . losartan (COZAAR) 100 MG tablet Take 1 tablet (100 mg total) by mouth daily.  90 tablet  3  . DISCONTD: hydrochlorothiazide (HYDRODIURIL) 25 MG tablet Take 0.5 tablets by mouth as needed.        BP 120/68  Pulse 58  Ht 5\' 3"  (1.6 m)  Wt 139 lb (63.05 kg)  BMI 24.62 kg/m2 General: NAD Neck: No JVD, no thyromegaly or thyroid nodule.  Lungs: Clear to auscultation bilaterally with normal respiratory effort. CV: Nondisplaced PMI.  Heart regular S1/S2, no S3/S4, no murmur.  No peripheral edema.  No carotid bruit.  Normal pedal  pulses.  Abdomen: Soft, nontender, no hepatosplenomegaly, no distention.  Neurologic: Alert and oriented x 3.  Psych: Normal affect. Extremities: No clubbing or cyanosis.

## 2011-05-16 NOTE — Assessment & Plan Note (Signed)
Micardis/HCT is too expensive.  I will stop this and have her take losartan 100 mg daily and HCTZ 12.5 mg daily.  I will get a BMET in 2 wks on this regimen.  We will call her in 2 wks to see what her BP is running on the new regimen.

## 2011-05-16 NOTE — Assessment & Plan Note (Signed)
I will check lipids/LFTs today with goal LDL < 70.  It would be fine for her to take generic atorvastatin rather than Lipitor.

## 2011-05-16 NOTE — Assessment & Plan Note (Signed)
Stable, no ischemic symptoms.  I will have her continue her current regimen of ASA 81, statin, Imdur, and Toprol.

## 2011-05-31 ENCOUNTER — Encounter (INDEPENDENT_AMBULATORY_CARE_PROVIDER_SITE_OTHER): Payer: Medicare HMO | Admitting: Ophthalmology

## 2011-05-31 ENCOUNTER — Encounter: Payer: Self-pay | Admitting: Cardiology

## 2011-05-31 DIAGNOSIS — H3530 Unspecified macular degeneration: Secondary | ICD-10-CM

## 2011-05-31 DIAGNOSIS — H43819 Vitreous degeneration, unspecified eye: Secondary | ICD-10-CM

## 2011-05-31 DIAGNOSIS — H35359 Cystoid macular degeneration, unspecified eye: Secondary | ICD-10-CM

## 2011-05-31 DIAGNOSIS — H33009 Unspecified retinal detachment with retinal break, unspecified eye: Secondary | ICD-10-CM

## 2011-06-01 ENCOUNTER — Telehealth: Payer: Self-pay | Admitting: *Deleted

## 2011-06-01 NOTE — Telephone Encounter (Signed)
BP looks fine on new regimen.

## 2011-06-01 NOTE — Telephone Encounter (Signed)
HYPERTENSION, UNSPECIFIED - Marca Ancona, MD 05/16/2011 11:39 AM Signed  Micardis/HCT is too expensive. I will stop this and have her take losartan 100 mg daily and HCTZ 12.5 mg daily. I will get a BMET in 2 wks on this regimen. We will call her in 2 wks to see what her BP is running on the new regimen.

## 2011-06-01 NOTE — Telephone Encounter (Signed)
06/01/11 I talked with pt about recent BP    05/18/11  119/64 57  05/19/11 115/58 59   05/20/11 149/76 57(stressed)  05/21/11 118/59 56 05/22/11 145/72 56 (stressed) 05/23/11 120/58 56  05/24/11 122/63 50  05/25/11 127/65 59  05/26/11 117 /58 54  05/27/11 125/64 59  05/28/11 126 65 67  05/29/11 122/63 60  05/30/11 124/64 63   05/31/11 118/66 57--pt feels good. I will forward to Dr Shirlee Latch for review.

## 2011-06-02 NOTE — Telephone Encounter (Signed)
LMTCB

## 2011-06-03 NOTE — Telephone Encounter (Signed)
Patient is aware that Dr. Shirlee Latch said her B/P looks fine with new regimen.

## 2011-06-03 NOTE — Telephone Encounter (Signed)
Pt returning call to Anne. Please call back.  

## 2011-06-07 ENCOUNTER — Telehealth: Payer: Self-pay | Admitting: Cardiology

## 2011-06-07 NOTE — Telephone Encounter (Signed)
Pt returning your call someone has tried to call her 3 times she has been out of town

## 2011-07-01 ENCOUNTER — Other Ambulatory Visit: Payer: Self-pay | Admitting: *Deleted

## 2011-07-01 MED ORDER — HYDROCHLOROTHIAZIDE 12.5 MG PO CAPS: 12.5000 mg | ORAL_CAPSULE | Freq: Every day | ORAL | Status: DC

## 2011-07-01 MED ORDER — LOSARTAN POTASSIUM 100 MG PO TABS: 100.0000 mg | ORAL_TABLET | Freq: Every day | ORAL | Status: DC

## 2011-07-05 ENCOUNTER — Encounter (INDEPENDENT_AMBULATORY_CARE_PROVIDER_SITE_OTHER): Payer: Medicare HMO | Admitting: Ophthalmology

## 2011-07-05 DIAGNOSIS — H35379 Puckering of macula, unspecified eye: Secondary | ICD-10-CM

## 2011-07-05 DIAGNOSIS — I10 Essential (primary) hypertension: Secondary | ICD-10-CM

## 2011-07-05 DIAGNOSIS — H35039 Hypertensive retinopathy, unspecified eye: Secondary | ICD-10-CM

## 2011-07-05 DIAGNOSIS — H35359 Cystoid macular degeneration, unspecified eye: Secondary | ICD-10-CM

## 2011-08-05 ENCOUNTER — Encounter (INDEPENDENT_AMBULATORY_CARE_PROVIDER_SITE_OTHER): Payer: Medicare HMO | Admitting: Ophthalmology

## 2011-08-05 DIAGNOSIS — H35359 Cystoid macular degeneration, unspecified eye: Secondary | ICD-10-CM

## 2011-08-05 DIAGNOSIS — E1139 Type 2 diabetes mellitus with other diabetic ophthalmic complication: Secondary | ICD-10-CM

## 2011-08-05 DIAGNOSIS — H33009 Unspecified retinal detachment with retinal break, unspecified eye: Secondary | ICD-10-CM

## 2011-08-05 DIAGNOSIS — H35039 Hypertensive retinopathy, unspecified eye: Secondary | ICD-10-CM

## 2011-08-05 DIAGNOSIS — H43819 Vitreous degeneration, unspecified eye: Secondary | ICD-10-CM

## 2011-08-05 DIAGNOSIS — E11319 Type 2 diabetes mellitus with unspecified diabetic retinopathy without macular edema: Secondary | ICD-10-CM

## 2011-08-05 DIAGNOSIS — I10 Essential (primary) hypertension: Secondary | ICD-10-CM

## 2011-08-05 DIAGNOSIS — H35379 Puckering of macula, unspecified eye: Secondary | ICD-10-CM

## 2011-09-01 ENCOUNTER — Encounter (INDEPENDENT_AMBULATORY_CARE_PROVIDER_SITE_OTHER): Payer: Medicare Other | Admitting: Ophthalmology

## 2011-09-01 DIAGNOSIS — H43819 Vitreous degeneration, unspecified eye: Secondary | ICD-10-CM

## 2011-09-01 DIAGNOSIS — H35359 Cystoid macular degeneration, unspecified eye: Secondary | ICD-10-CM

## 2011-09-01 DIAGNOSIS — I10 Essential (primary) hypertension: Secondary | ICD-10-CM

## 2011-09-01 DIAGNOSIS — H33009 Unspecified retinal detachment with retinal break, unspecified eye: Secondary | ICD-10-CM

## 2011-09-01 DIAGNOSIS — H35379 Puckering of macula, unspecified eye: Secondary | ICD-10-CM

## 2011-09-01 DIAGNOSIS — H35039 Hypertensive retinopathy, unspecified eye: Secondary | ICD-10-CM

## 2011-09-02 ENCOUNTER — Encounter (INDEPENDENT_AMBULATORY_CARE_PROVIDER_SITE_OTHER): Payer: Medicare HMO | Admitting: Ophthalmology

## 2011-09-30 ENCOUNTER — Encounter (INDEPENDENT_AMBULATORY_CARE_PROVIDER_SITE_OTHER): Payer: Medicare Other | Admitting: Ophthalmology

## 2011-09-30 DIAGNOSIS — H35359 Cystoid macular degeneration, unspecified eye: Secondary | ICD-10-CM

## 2011-09-30 DIAGNOSIS — H35379 Puckering of macula, unspecified eye: Secondary | ICD-10-CM

## 2011-09-30 DIAGNOSIS — H35039 Hypertensive retinopathy, unspecified eye: Secondary | ICD-10-CM

## 2011-09-30 DIAGNOSIS — H33009 Unspecified retinal detachment with retinal break, unspecified eye: Secondary | ICD-10-CM

## 2011-09-30 DIAGNOSIS — I10 Essential (primary) hypertension: Secondary | ICD-10-CM

## 2011-09-30 DIAGNOSIS — H43819 Vitreous degeneration, unspecified eye: Secondary | ICD-10-CM

## 2011-11-04 ENCOUNTER — Encounter (INDEPENDENT_AMBULATORY_CARE_PROVIDER_SITE_OTHER): Payer: Medicare Other | Admitting: Ophthalmology

## 2011-11-04 DIAGNOSIS — H35039 Hypertensive retinopathy, unspecified eye: Secondary | ICD-10-CM

## 2011-11-04 DIAGNOSIS — H43819 Vitreous degeneration, unspecified eye: Secondary | ICD-10-CM

## 2011-11-04 DIAGNOSIS — I10 Essential (primary) hypertension: Secondary | ICD-10-CM

## 2011-11-04 DIAGNOSIS — H33009 Unspecified retinal detachment with retinal break, unspecified eye: Secondary | ICD-10-CM

## 2011-11-04 DIAGNOSIS — H35359 Cystoid macular degeneration, unspecified eye: Secondary | ICD-10-CM

## 2011-12-16 ENCOUNTER — Encounter (INDEPENDENT_AMBULATORY_CARE_PROVIDER_SITE_OTHER): Payer: Medicare Other | Admitting: Ophthalmology

## 2011-12-16 DIAGNOSIS — H43819 Vitreous degeneration, unspecified eye: Secondary | ICD-10-CM

## 2011-12-16 DIAGNOSIS — I10 Essential (primary) hypertension: Secondary | ICD-10-CM

## 2011-12-16 DIAGNOSIS — H33009 Unspecified retinal detachment with retinal break, unspecified eye: Secondary | ICD-10-CM

## 2011-12-16 DIAGNOSIS — H35039 Hypertensive retinopathy, unspecified eye: Secondary | ICD-10-CM

## 2011-12-16 DIAGNOSIS — H35379 Puckering of macula, unspecified eye: Secondary | ICD-10-CM

## 2011-12-16 DIAGNOSIS — H35359 Cystoid macular degeneration, unspecified eye: Secondary | ICD-10-CM

## 2012-01-30 ENCOUNTER — Encounter (INDEPENDENT_AMBULATORY_CARE_PROVIDER_SITE_OTHER): Payer: Medicare Other | Admitting: Ophthalmology

## 2012-01-30 DIAGNOSIS — H43819 Vitreous degeneration, unspecified eye: Secondary | ICD-10-CM

## 2012-01-30 DIAGNOSIS — H35039 Hypertensive retinopathy, unspecified eye: Secondary | ICD-10-CM

## 2012-01-30 DIAGNOSIS — H33009 Unspecified retinal detachment with retinal break, unspecified eye: Secondary | ICD-10-CM

## 2012-01-30 DIAGNOSIS — H35379 Puckering of macula, unspecified eye: Secondary | ICD-10-CM

## 2012-01-30 DIAGNOSIS — H35359 Cystoid macular degeneration, unspecified eye: Secondary | ICD-10-CM

## 2012-01-30 DIAGNOSIS — I10 Essential (primary) hypertension: Secondary | ICD-10-CM

## 2012-03-28 ENCOUNTER — Encounter (HOSPITAL_COMMUNITY): Payer: Self-pay | Admitting: Pharmacy Technician

## 2012-03-28 ENCOUNTER — Encounter (INDEPENDENT_AMBULATORY_CARE_PROVIDER_SITE_OTHER): Payer: Medicare Other | Admitting: Ophthalmology

## 2012-03-28 DIAGNOSIS — I10 Essential (primary) hypertension: Secondary | ICD-10-CM

## 2012-03-28 DIAGNOSIS — H3520 Other non-diabetic proliferative retinopathy, unspecified eye: Secondary | ICD-10-CM

## 2012-03-28 DIAGNOSIS — H33009 Unspecified retinal detachment with retinal break, unspecified eye: Secondary | ICD-10-CM | POA: Diagnosis present

## 2012-03-28 DIAGNOSIS — H35039 Hypertensive retinopathy, unspecified eye: Secondary | ICD-10-CM

## 2012-03-28 DIAGNOSIS — H43819 Vitreous degeneration, unspecified eye: Secondary | ICD-10-CM

## 2012-03-28 NOTE — H&P (Signed)
[name] [name] is an 78 y.o. female.   Chief Complaint:Loss of vision right eye HPI: Recurrent retinal detachment right eye  Past Medical History  Diagnosis Date  . Hypercholesterolemia   . CAD (coronary artery disease)     The patient had a left heart catheterization in August 2006 showed an EF of 70%, a 70% mid LAD lesion and 80% distal LAD ledsion, as well as 50% ostial first diagonal lesion.  The patient did have a drug-eluting stent placed in her mid LAD at that time.    . Diastolic heart failure     Echo 11/10 with EF 55-60%, moderate diastolic dysfunction, no regional WMAs, mild to moderate TR, moderate LAE, mild AI  . Hypertension   . Hypothyroidism   . Breast cancer     treated with mastectomy/tomoxifen in early 2000s    Past Surgical History  Procedure Date  . Adenosine myoview     9/09: EF 69% with normal perfusion images suggesting no ischemia or infarction.      No family history on file. Social History:  reports that she has never smoked. She does not have any smokeless tobacco history on file. She reports that she does not use illicit drugs. Her alcohol history not on file.  Allergies: No Known Allergies  No prescriptions prior to admission    Review of systems otherwise negative  There were no vitals taken for this visit.  Physical exam: Mental status: oriented x3. Eyes: See eye exam associated with this date of surgery in media tab.  Scanned in by scanning center Ears, Nose, Throat: within normal limits Neck: Within Normal limits General: within normal limits Chest: Within normal limits Breast: deferred Heart: Within normal limits Abdomen: Within normal limits GU: deferred Extremities: within normal limits Skin: within normal limits  Assessment/Plan Rhegmatogenous retinal detachment right eye Plan: To [facility] for Pars plana vitrectomy, membrane peel, gas injection, possible silicone oil right eye  Sherrie George 03/28/2012, 12:39 PM

## 2012-03-30 ENCOUNTER — Encounter (HOSPITAL_COMMUNITY)
Admission: RE | Admit: 2012-03-30 | Discharge: 2012-03-30 | Disposition: A | Discharge status: Home / Self Care | Payer: Medicare Other | Source: Ambulatory Visit | Attending: Ophthalmology | Admitting: Ophthalmology

## 2012-03-30 ENCOUNTER — Encounter (HOSPITAL_COMMUNITY): Payer: Self-pay

## 2012-03-30 ENCOUNTER — Ambulatory Visit (HOSPITAL_COMMUNITY)
Admission: RE | Admit: 2012-03-30 | Discharge: 2012-03-30 | Disposition: A | Discharge status: Home / Self Care | Payer: Medicare Other | Source: Ambulatory Visit | Attending: Ophthalmology | Admitting: Ophthalmology

## 2012-03-30 DIAGNOSIS — Z01818 Encounter for other preprocedural examination: Secondary | ICD-10-CM | POA: Insufficient documentation

## 2012-03-30 DIAGNOSIS — Z01812 Encounter for preprocedural laboratory examination: Secondary | ICD-10-CM | POA: Insufficient documentation

## 2012-03-30 HISTORY — DX: Nocturia: R35.1

## 2012-03-30 HISTORY — DX: Depression, unspecified: F32.A

## 2012-03-30 HISTORY — DX: Cardiac murmur, unspecified: R01.1

## 2012-03-30 HISTORY — DX: Mental disorder, not otherwise specified: F99

## 2012-03-30 HISTORY — DX: Unspecified osteoarthritis, unspecified site: M19.90

## 2012-03-30 HISTORY — DX: Bronchitis, not specified as acute or chronic: J40

## 2012-03-30 HISTORY — DX: Major depressive disorder, single episode, unspecified: F32.9

## 2012-03-30 LAB — CBC
HCT: 38.6 % (ref 36.0–46.0)
Hemoglobin: 13 g/dL (ref 12.0–15.0)
MCH: 30 pg (ref 26.0–34.0)
MCHC: 33.7 g/dL (ref 30.0–36.0)
MCV: 88.9 fL (ref 78.0–100.0)
Platelets: 195 10*3/uL (ref 150–400)
RBC: 4.34 MIL/uL (ref 3.87–5.11)
RDW: 12.2 % (ref 11.5–15.5)
WBC: 5.5 10*3/uL (ref 4.0–10.5)

## 2012-03-30 LAB — BASIC METABOLIC PANEL
BUN: 11 mg/dL (ref 6–23)
CO2: 32 mEq/L (ref 19–32)
Calcium: 9.8 mg/dL (ref 8.4–10.5)
Chloride: 99 mEq/L (ref 96–112)
Creatinine, Ser: 0.71 mg/dL (ref 0.50–1.10)
GFR calc Af Amer: >90 mL/min (ref >90)
GFR calc non Af Amer: 82 mL/min — ABNORMAL LOW (ref >90)
Glucose, Bld: 96 mg/dL (ref 70–99)
Potassium: 3.9 mEq/L (ref 3.5–5.1)
Sodium: 139 mEq/L (ref 135–145)

## 2012-03-30 LAB — SURGICAL PCR SCREEN
MRSA, PCR: NEGATIVE
Staphylococcus aureus: NEGATIVE

## 2012-03-30 NOTE — Pre-Procedure Instructions (Signed)
20 [name] [name]  03/30/2012   Your procedure is scheduled on:  Tuesday April 03, 2012.  Report to [facility] at 0945 AM.  Call this number if you have problems the morning of surgery: [phone]   Remember:   Do not eat food or drink:After Midnight.    Take these medicines the morning of surgery with A SIP OF WATER: Isosorbide (Imdur), Levothyroxine (Synthroid), Metoprolol (Toprol XL), and Venlafaxine (Effexor XR)   Do not wear jewelry, make-up or nail polish.  Do not wear lotions, powders, or perfumes.   Do not shave 48 hours prior to surgery.   Do not bring valuables to the hospital.  Contacts, dentures or bridgework may not be worn into surgery.  Leave suitcase in the car. After surgery it may be brought to your room.  For patients admitted to the hospital, checkout time is 11:00 AM the day of discharge.   Patients discharged the day of surgery will not be allowed to drive home.  Name and phone number of your driver:   Special Instructions: CHG Shower Use Special Wash: 1/2 bottle night before surgery and 1/2 bottle morning of surgery.   Please read over the following fact sheets that you were given: Pain Booklet, Coughing and Deep Breathing, MRSA Information and Surgical Site Infection Prevention

## 2012-04-02 ENCOUNTER — Encounter (INDEPENDENT_AMBULATORY_CARE_PROVIDER_SITE_OTHER): Payer: Medicare Other | Admitting: Ophthalmology

## 2012-04-02 MED ORDER — TROPICAMIDE 1 % OP SOLN: 1.0000 [drp] | OPHTHALMIC | Status: DC | PRN

## 2012-04-02 MED ORDER — CYCLOPENTOLATE HCL 1 % OP SOLN: 1.0000 [drp] | OPHTHALMIC | Status: DC | PRN

## 2012-04-02 MED ORDER — CEFAZOLIN SODIUM-DEXTROSE 2-3 GM-% IV SOLR
2.0000 g | INTRAVENOUS | Status: AC
  Administered 2012-04-03: 2 g via INTRAVENOUS
  Filled 2012-04-02: qty 50

## 2012-04-02 MED ORDER — PHENYLEPHRINE HCL 2.5 % OP SOLN: 1.0000 [drp] | OPHTHALMIC | Status: DC | PRN

## 2012-04-02 MED ORDER — GATIFLOXACIN 0.5 % OP SOLN: 1.0000 [drp] | OPHTHALMIC | Status: DC | PRN

## 2012-04-03 ENCOUNTER — Encounter (HOSPITAL_COMMUNITY): Payer: Self-pay | Admitting: Anesthesiology

## 2012-04-03 ENCOUNTER — Encounter (HOSPITAL_COMMUNITY)
Admission: RE | Disposition: A | Discharge status: Home / Self Care | Payer: Self-pay | Source: Ambulatory Visit | Attending: Ophthalmology

## 2012-04-03 ENCOUNTER — Ambulatory Visit (HOSPITAL_COMMUNITY)
Admission: RE | Admit: 2012-04-03 | Discharge: 2012-04-04 | Disposition: A | Discharge status: Home / Self Care | Payer: Medicare Other | Source: Ambulatory Visit | Attending: Ophthalmology | Admitting: Ophthalmology

## 2012-04-03 ENCOUNTER — Encounter (HOSPITAL_COMMUNITY): Payer: Self-pay | Admitting: General Practice

## 2012-04-03 ENCOUNTER — Ambulatory Visit (HOSPITAL_COMMUNITY): Payer: Medicare Other | Admitting: Anesthesiology

## 2012-04-03 DIAGNOSIS — E7800 Pure hypercholesterolemia, unspecified: Secondary | ICD-10-CM | POA: Insufficient documentation

## 2012-04-03 DIAGNOSIS — Z853 Personal history of malignant neoplasm of breast: Secondary | ICD-10-CM | POA: Insufficient documentation

## 2012-04-03 DIAGNOSIS — H3340 Traction detachment of retina, unspecified eye: Secondary | ICD-10-CM | POA: Insufficient documentation

## 2012-04-03 DIAGNOSIS — I059 Rheumatic mitral valve disease, unspecified: Secondary | ICD-10-CM | POA: Insufficient documentation

## 2012-04-03 DIAGNOSIS — E039 Hypothyroidism, unspecified: Secondary | ICD-10-CM | POA: Insufficient documentation

## 2012-04-03 DIAGNOSIS — I5030 Unspecified diastolic (congestive) heart failure: Secondary | ICD-10-CM | POA: Insufficient documentation

## 2012-04-03 DIAGNOSIS — I2510 Atherosclerotic heart disease of native coronary artery without angina pectoris: Secondary | ICD-10-CM | POA: Insufficient documentation

## 2012-04-03 DIAGNOSIS — Z9861 Coronary angioplasty status: Secondary | ICD-10-CM | POA: Insufficient documentation

## 2012-04-03 DIAGNOSIS — H33009 Unspecified retinal detachment with retinal break, unspecified eye: Secondary | ICD-10-CM

## 2012-04-03 DIAGNOSIS — I10 Essential (primary) hypertension: Secondary | ICD-10-CM | POA: Insufficient documentation

## 2012-04-03 HISTORY — DX: Anxiety disorder, unspecified: F41.9

## 2012-04-03 HISTORY — DX: Type 2 diabetes mellitus without complications: E11.9

## 2012-04-03 HISTORY — PX: PARS PLANA VITRECTOMY: SHX2166

## 2012-04-03 HISTORY — DX: Angina pectoris, unspecified: I20.9

## 2012-04-03 HISTORY — DX: Gastro-esophageal reflux disease without esophagitis: K21.9

## 2012-04-03 SURGERY — PARS PLANA VITRECTOMY WITH 25 GAUGE
Anesthesia: General | Site: Eye | Laterality: Right | Wound class: Clean

## 2012-04-03 MED ORDER — SODIUM CHLORIDE 0.45 % IV SOLN
INTRAVENOUS | Status: DC
  Administered 2012-04-03: 16:00:00 via INTRAVENOUS

## 2012-04-03 MED ORDER — CYCLOPENTOLATE HCL 1 % OP SOLN
OPHTHALMIC | Status: AC
  Filled 2012-04-03: qty 2

## 2012-04-03 MED ORDER — HEMOSTATIC AGENTS (NO CHARGE) OPTIME
TOPICAL | Status: DC | PRN
  Administered 2012-04-03: 1 via TOPICAL

## 2012-04-03 MED ORDER — DOCUSATE SODIUM 100 MG PO CAPS
100.0000 mg | ORAL_CAPSULE | Freq: Two times a day (BID) | ORAL | Status: DC
  Administered 2012-04-03: 100 mg via ORAL
  Filled 2012-04-03: qty 1

## 2012-04-03 MED ORDER — TROPICAMIDE 1 % OP SOLN
OPHTHALMIC | Status: AC
  Filled 2012-04-03: qty 3

## 2012-04-03 MED ORDER — LACTATED RINGERS IV SOLN
INTRAVENOUS | Status: DC | PRN
  Administered 2012-04-03: 11:00:00 via INTRAVENOUS

## 2012-04-03 MED ORDER — GATIFLOXACIN 0.5 % OP SOLN
1.0000 [drp] | Freq: Four times a day (QID) | OPHTHALMIC | Status: DC
  Filled 2012-04-03: qty 2.5

## 2012-04-03 MED ORDER — DOCUSATE SODIUM 100 MG PO CAPS: 100.0000 mg | ORAL_CAPSULE | Freq: Two times a day (BID) | ORAL | Status: DC

## 2012-04-03 MED ORDER — BSS IO SOLN
INTRAOCULAR | Status: DC | PRN
  Administered 2012-04-03: 15 mL via INTRAOCULAR

## 2012-04-03 MED ORDER — GATIFLOXACIN 0.5 % OP SOLN
OPHTHALMIC | Status: AC
  Filled 2012-04-03: qty 2.5

## 2012-04-03 MED ORDER — BACITRACIN-POLYMYXIN B 500-10000 UNIT/GM OP OINT
1.0000 "application " | TOPICAL_OINTMENT | Freq: Four times a day (QID) | OPHTHALMIC | Status: DC
  Filled 2012-04-03: qty 3.5

## 2012-04-03 MED ORDER — GATIFLOXACIN 0.5 % OP SOLN
1.0000 [drp] | OPHTHALMIC | Status: AC | PRN
  Administered 2012-04-03 (×3): 1 [drp] via OPHTHALMIC

## 2012-04-03 MED ORDER — LIDOCAINE HCL 2 % IJ SOLN
INTRAMUSCULAR | Status: AC
  Filled 2012-04-03: qty 1

## 2012-04-03 MED ORDER — BUPIVACAINE HCL 0.75 % IJ SOLN
INTRAMUSCULAR | Status: DC | PRN
  Administered 2012-04-03: 10 mL

## 2012-04-03 MED ORDER — TRAZODONE HCL 150 MG PO TABS
150.0000 mg | ORAL_TABLET | Freq: Every day | ORAL | Status: DC
  Administered 2012-04-03: 150 mg via ORAL
  Filled 2012-04-03 (×2): qty 1

## 2012-04-03 MED ORDER — PHENYLEPHRINE HCL 2.5 % OP SOLN
1.0000 [drp] | OPHTHALMIC | Status: AC | PRN
  Administered 2012-04-03 (×3): 1 [drp] via OPHTHALMIC

## 2012-04-03 MED ORDER — TROPICAMIDE 1 % OP SOLN
1.0000 [drp] | OPHTHALMIC | Status: AC | PRN
  Administered 2012-04-03 (×3): 1 [drp] via OPHTHALMIC

## 2012-04-03 MED ORDER — SODIUM CHLORIDE 0.9 % IJ SOLN
INTRAMUSCULAR | Status: DC | PRN
  Administered 2012-04-03: 13:00:00

## 2012-04-03 MED ORDER — TEMAZEPAM 15 MG PO CAPS: 15.0000 mg | ORAL_CAPSULE | Freq: Every evening | ORAL | Status: DC | PRN

## 2012-04-03 MED ORDER — LEVOTHYROXINE SODIUM 75 MCG PO TABS
75.0000 ug | ORAL_TABLET | Freq: Every day | ORAL | Status: DC
  Administered 2012-04-04: 75 ug via ORAL
  Filled 2012-04-03 (×2): qty 1

## 2012-04-03 MED ORDER — MAGNESIUM HYDROXIDE 400 MG/5ML PO SUSP: 15.0000 mL | Freq: Four times a day (QID) | ORAL | Status: DC | PRN

## 2012-04-03 MED ORDER — ONDANSETRON HCL 4 MG/2ML IJ SOLN: 4.0000 mg | Freq: Four times a day (QID) | INTRAMUSCULAR | Status: DC | PRN

## 2012-04-03 MED ORDER — SODIUM HYALURONATE 10 MG/ML IO SOLN
INTRAOCULAR | Status: AC
  Filled 2012-04-03: qty 0.85

## 2012-04-03 MED ORDER — ONDANSETRON HCL 4 MG/2ML IJ SOLN
INTRAMUSCULAR | Status: DC | PRN
  Administered 2012-04-03: 4 mg via INTRAVENOUS

## 2012-04-03 MED ORDER — HYDROCODONE-ACETAMINOPHEN 5-325 MG PO TABS
ORAL_TABLET | ORAL | Status: AC
  Filled 2012-04-03: qty 2

## 2012-04-03 MED ORDER — LATANOPROST 0.005 % OP SOLN
1.0000 [drp] | Freq: Every day | OPHTHALMIC | Status: DC
  Filled 2012-04-03: qty 2.5

## 2012-04-03 MED ORDER — ATROPINE SULFATE 1 % OP SOLN
OPHTHALMIC | Status: AC
  Filled 2012-04-03: qty 2

## 2012-04-03 MED ORDER — BUPIVACAINE HCL 0.75 % IJ SOLN
INTRAMUSCULAR | Status: AC
  Filled 2012-04-03: qty 10

## 2012-04-03 MED ORDER — ACETAZOLAMIDE SODIUM 500 MG IJ SOLR
INTRAMUSCULAR | Status: AC
  Filled 2012-04-03: qty 500

## 2012-04-03 MED ORDER — DEXAMETHASONE SODIUM PHOSPHATE 10 MG/ML IJ SOLN
INTRAMUSCULAR | Status: AC
  Filled 2012-04-03: qty 1

## 2012-04-03 MED ORDER — MINERAL OIL LIGHT 100 % EX OIL
TOPICAL_OIL | CUTANEOUS | Status: AC
  Filled 2012-04-03: qty 25

## 2012-04-03 MED ORDER — BACITRACIN-POLYMYXIN B 500-10000 UNIT/GM OP OINT
TOPICAL_OINTMENT | OPHTHALMIC | Status: AC
  Filled 2012-04-03: qty 3.5

## 2012-04-03 MED ORDER — ROCURONIUM BROMIDE 100 MG/10ML IV SOLN
INTRAVENOUS | Status: DC | PRN
  Administered 2012-04-03: 25 mg via INTRAVENOUS

## 2012-04-03 MED ORDER — TRIAMCINOLONE ACETONIDE 40 MG/ML IJ SUSP
INTRAMUSCULAR | Status: AC
  Filled 2012-04-03: qty 1

## 2012-04-03 MED ORDER — HYDROCODONE-ACETAMINOPHEN 5-325 MG PO TABS
1.0000 | ORAL_TABLET | ORAL | Status: DC | PRN
  Administered 2012-04-03 – 2012-04-04 (×3): 2 via ORAL
  Filled 2012-04-03 (×2): qty 2

## 2012-04-03 MED ORDER — ATORVASTATIN CALCIUM 40 MG PO TABS
40.0000 mg | ORAL_TABLET | Freq: Every day | ORAL | Status: DC
  Administered 2012-04-03: 40 mg via ORAL
  Filled 2012-04-03 (×2): qty 1

## 2012-04-03 MED ORDER — ACETAMINOPHEN 325 MG PO TABS: 325.0000 mg | ORAL_TABLET | ORAL | Status: DC | PRN

## 2012-04-03 MED ORDER — PHENYLEPHRINE HCL 2.5 % OP SOLN
OPHTHALMIC | Status: AC
  Administered 2012-04-03: 1 [drp] via OPHTHALMIC
  Filled 2012-04-03: qty 3

## 2012-04-03 MED ORDER — BRIMONIDINE TARTRATE 0.2 % OP SOLN
1.0000 [drp] | Freq: Two times a day (BID) | OPHTHALMIC | Status: DC
  Filled 2012-04-03: qty 5

## 2012-04-03 MED ORDER — PHENYLEPHRINE HCL 10 MG/ML IJ SOLN
INTRAMUSCULAR | Status: DC | PRN
  Administered 2012-04-03 (×2): 40 ug via INTRAVENOUS

## 2012-04-03 MED ORDER — BACITRACIN-POLYMYXIN B 500-10000 UNIT/GM OP OINT
TOPICAL_OINTMENT | OPHTHALMIC | Status: DC | PRN
  Administered 2012-04-03: 1 via OPHTHALMIC

## 2012-04-03 MED ORDER — BSS IO SOLN
INTRAOCULAR | Status: AC
  Filled 2012-04-03: qty 15

## 2012-04-03 MED ORDER — GLYCOPYRROLATE 0.2 MG/ML IJ SOLN
INTRAMUSCULAR | Status: DC | PRN
Start: 2012-04-03 — End: 2012-04-03
  Administered 2012-04-03: .4 mg via INTRAVENOUS

## 2012-04-03 MED ORDER — MORPHINE SULFATE 2 MG/ML IJ SOLN: 1.0000 mg | INTRAMUSCULAR | Status: DC | PRN

## 2012-04-03 MED ORDER — HYPROMELLOSE (GONIOSCOPIC) 2.5 % OP SOLN
OPHTHALMIC | Status: AC
  Filled 2012-04-03: qty 15

## 2012-04-03 MED ORDER — NEOSTIGMINE METHYLSULFATE 1 MG/ML IJ SOLN
INTRAMUSCULAR | Status: DC | PRN
  Administered 2012-04-03: 3 mg via INTRAVENOUS

## 2012-04-03 MED ORDER — PROVISC 10 MG/ML IO SOLN
INTRAOCULAR | Status: DC | PRN
  Administered 2012-04-03: .85 mL via INTRAOCULAR

## 2012-04-03 MED ORDER — FENTANYL CITRATE 0.05 MG/ML IJ SOLN
INTRAMUSCULAR | Status: DC | PRN
  Administered 2012-04-03: 75 ug via INTRAVENOUS
  Administered 2012-04-03: 50 ug via INTRAVENOUS

## 2012-04-03 MED ORDER — EPINEPHRINE HCL 1 MG/ML IJ SOLN
INTRAOCULAR | Status: DC | PRN
  Administered 2012-04-03: 12:00:00

## 2012-04-03 MED ORDER — TETRACAINE HCL 0.5 % OP SOLN
2.0000 [drp] | Freq: Once | OPHTHALMIC | Status: DC
  Filled 2012-04-03: qty 2

## 2012-04-03 MED ORDER — GENTAMICIN SULFATE 40 MG/ML IJ SOLN
INTRAMUSCULAR | Status: AC
  Filled 2012-04-03: qty 2

## 2012-04-03 MED ORDER — PROPOFOL 10 MG/ML IV EMUL
INTRAVENOUS | Status: DC | PRN
  Administered 2012-04-03: 150 mg via INTRAVENOUS

## 2012-04-03 MED ORDER — POLYMYXIN B SULFATE 500000 UNITS IJ SOLR
INTRAMUSCULAR | Status: AC
  Filled 2012-04-03: qty 1

## 2012-04-03 MED ORDER — HYDROMORPHONE HCL PF 1 MG/ML IJ SOLN
INTRAMUSCULAR | Status: AC
  Filled 2012-04-03: qty 1

## 2012-04-03 MED ORDER — VENLAFAXINE HCL 75 MG PO TABS
75.0000 mg | ORAL_TABLET | Freq: Two times a day (BID) | ORAL | Status: DC
  Administered 2012-04-03: 75 mg via ORAL
  Filled 2012-04-03 (×3): qty 1

## 2012-04-03 MED ORDER — HYDROCHLOROTHIAZIDE 12.5 MG PO CAPS
12.5000 mg | ORAL_CAPSULE | Freq: Every day | ORAL | Status: DC
  Filled 2012-04-03 (×2): qty 1

## 2012-04-03 MED ORDER — ACETAZOLAMIDE SODIUM 500 MG IJ SOLR
500.0000 mg | Freq: Once | INTRAMUSCULAR | Status: AC
  Administered 2012-04-04: 500 mg via INTRAVENOUS
  Filled 2012-04-03: qty 500

## 2012-04-03 MED ORDER — LOSARTAN POTASSIUM 50 MG PO TABS
100.0000 mg | ORAL_TABLET | Freq: Every day | ORAL | Status: DC
  Administered 2012-04-03: 100 mg via ORAL
  Filled 2012-04-03 (×2): qty 2

## 2012-04-03 MED ORDER — METOPROLOL SUCCINATE ER 50 MG PO TB24
50.0000 mg | ORAL_TABLET | Freq: Every day | ORAL | Status: DC
  Filled 2012-04-03: qty 1

## 2012-04-03 MED ORDER — EPINEPHRINE HCL 1 MG/ML IJ SOLN
INTRAMUSCULAR | Status: AC
  Filled 2012-04-03: qty 1

## 2012-04-03 MED ORDER — LIDOCAINE HCL (CARDIAC) 20 MG/ML IV SOLN
INTRAVENOUS | Status: DC | PRN
  Administered 2012-04-03: 30 mg via INTRAVENOUS

## 2012-04-03 MED ORDER — HYALURONIDASE HUMAN 150 UNIT/ML IJ SOLN
INTRAMUSCULAR | Status: AC
  Filled 2012-04-03: qty 1

## 2012-04-03 MED ORDER — ATROPINE SULFATE 1 % OP SOLN
OPHTHALMIC | Status: DC | PRN
  Administered 2012-04-03: 1 [drp] via OPHTHALMIC

## 2012-04-03 MED ORDER — PREDNISOLONE ACETATE 1 % OP SUSP
1.0000 [drp] | Freq: Four times a day (QID) | OPHTHALMIC | Status: DC
  Filled 2012-04-03: qty 1
  Filled 2012-04-03: qty 5

## 2012-04-03 MED ORDER — EPHEDRINE SULFATE 50 MG/ML IJ SOLN
INTRAMUSCULAR | Status: DC | PRN
  Administered 2012-04-03 (×2): 5 mg via INTRAVENOUS
  Administered 2012-04-03: 10 mg via INTRAVENOUS
  Administered 2012-04-03 (×3): 5 mg via INTRAVENOUS

## 2012-04-03 MED ORDER — DEXAMETHASONE SODIUM PHOSPHATE 10 MG/ML IJ SOLN
INTRAMUSCULAR | Status: DC | PRN
  Administered 2012-04-03: 10 mg

## 2012-04-03 MED ORDER — ONDANSETRON HCL 4 MG/2ML IJ SOLN: 4.0000 mg | Freq: Once | INTRAMUSCULAR | Status: DC | PRN

## 2012-04-03 MED ORDER — CYCLOPENTOLATE HCL 1 % OP SOLN
1.0000 [drp] | OPHTHALMIC | Status: AC | PRN
  Administered 2012-04-03 (×3): 1 [drp] via OPHTHALMIC

## 2012-04-03 MED ORDER — BSS PLUS IO SOLN
INTRAOCULAR | Status: DC | PRN
  Administered 2012-04-03: 1 via INTRAOCULAR

## 2012-04-03 MED ORDER — HYDROMORPHONE HCL PF 1 MG/ML IJ SOLN
0.2500 mg | INTRAMUSCULAR | Status: DC | PRN
  Administered 2012-04-03 (×2): 0.5 mg via INTRAVENOUS

## 2012-04-03 MED ORDER — ISOSORBIDE MONONITRATE ER 60 MG PO TB24
120.0000 mg | ORAL_TABLET | Freq: Every day | ORAL | Status: DC
  Filled 2012-04-03: qty 2

## 2012-04-03 MED ORDER — BSS PLUS IO SOLN
INTRAOCULAR | Status: AC
  Filled 2012-04-03: qty 500

## 2012-04-03 SURGICAL SUPPLY — 67 items
APPLICATOR DR MATTHEWS STRL (MISCELLANEOUS) ×3 IMPLANT
BLADE EYE CATARACT 19 1.4 BEAV (BLADE) IMPLANT
BLADE MVR KNIFE 19G (BLADE) ×3 IMPLANT
BLADE MVR KNIFE 20G (BLADE) ×3 IMPLANT
CANNULA DUAL BORE 23G (CANNULA) IMPLANT
CANNULA FLEX TIP 25G (CANNULA) ×6 IMPLANT
CLOTH BEACON ORANGE TIMEOUT ST (SAFETY) ×3 IMPLANT
CORDS BIPOLAR (ELECTRODE) ×3 IMPLANT
COTTONBALL LRG STERILE PKG (GAUZE/BANDAGES/DRESSINGS) ×9 IMPLANT
DRAPE INCISE 51X51 W/FILM STRL (DRAPES) ×3 IMPLANT
DRAPE OPHTHALMIC 77X100 STRL (CUSTOM PROCEDURE TRAY) ×3 IMPLANT
FILTER BLUE MILLIPORE (MISCELLANEOUS) IMPLANT
FILTER STRAW FLUID ASPIR (MISCELLANEOUS) IMPLANT
FORCEPS ECKARDT ILM 25G SERR (OPHTHALMIC RELATED) IMPLANT
GLOVE SS BIOGEL STRL SZ 6.5 (GLOVE) ×2 IMPLANT
GLOVE SS BIOGEL STRL SZ 7 (GLOVE) ×2 IMPLANT
GLOVE SUPERSENSE BIOGEL SZ 6.5 (GLOVE) ×1
GLOVE SUPERSENSE BIOGEL SZ 7 (GLOVE) ×1
GLOVE SURG 8.5 LATEX PF (GLOVE) ×3 IMPLANT
GLOVE SURG SS PI 6.5 STRL IVOR (GLOVE) ×3 IMPLANT
GOWN STRL NON-REIN LRG LVL3 (GOWN DISPOSABLE) ×9 IMPLANT
ILLUMINATOR CHOW PICK 25GA (MISCELLANEOUS) ×3 IMPLANT
KIT BASIN OR (CUSTOM PROCEDURE TRAY) ×3 IMPLANT
KIT PERFLUORON PROCEDURE 5ML (MISCELLANEOUS) ×3 IMPLANT
KIT ROOM TURNOVER OR (KITS) ×3 IMPLANT
KNIFE CRESCENT 2.5 55 ANG (BLADE) IMPLANT
LENS BIOM SUPER VIEW SET DISP (OPHTHALMIC RELATED) IMPLANT
MARKER SKIN DUAL TIP RULER LAB (MISCELLANEOUS) IMPLANT
MASK EYE SHIELD (GAUZE/BANDAGES/DRESSINGS) ×3 IMPLANT
MICROPICK 25G (MISCELLANEOUS)
NEEDLE 18GX1X1/2 (RX/OR ONLY) (NEEDLE) ×3 IMPLANT
NEEDLE 25GX 5/8IN NON SAFETY (NEEDLE) ×3 IMPLANT
NEEDLE 27GAX1X1/2 (NEEDLE) IMPLANT
NEEDLE FILTER BLUNT 18X 1/2SAF (NEEDLE) ×1
NEEDLE FILTER BLUNT 18X1 1/2 (NEEDLE) ×2 IMPLANT
NEEDLE HYPO 30X.5 LL (NEEDLE) ×3 IMPLANT
NS IRRIG 1000ML POUR BTL (IV SOLUTION) ×3 IMPLANT
OIL SILICONE OPHTHALMIC ADAPTO (Ophthalmic Related) ×3 IMPLANT
PACK VITRECTOMY CUSTOM (CUSTOM PROCEDURE TRAY) ×3 IMPLANT
PAD ARMBOARD 7.5X6 YLW CONV (MISCELLANEOUS) ×6 IMPLANT
PAD EYE OVAL STERILE LF (GAUZE/BANDAGES/DRESSINGS) ×3 IMPLANT
PAK VITRECTOMY PIK 25 GA (OPHTHALMIC RELATED) ×3 IMPLANT
PENCIL BIPOLAR 25GA STR DISP (OPHTHALMIC RELATED) IMPLANT
PICK MICROPICK 25G (MISCELLANEOUS) IMPLANT
PROBE DIRECTIONAL LASER (MISCELLANEOUS) IMPLANT
REPL STRA BRUSH NEEDLE (NEEDLE) IMPLANT
RESERVOIR BACK FLUSH (MISCELLANEOUS) ×3 IMPLANT
ROLLS DENTAL (MISCELLANEOUS) ×6 IMPLANT
SCRAPER DIAMOND DUST MEMBRANE (MISCELLANEOUS) IMPLANT
SPONGE SURGIFOAM ABS GEL 12-7 (HEMOSTASIS) ×3 IMPLANT
STOPCOCK 4 WAY LG BORE MALE ST (IV SETS) IMPLANT
SUT CHROMIC 7 0 TG140 8 (SUTURE) IMPLANT
SUT ETHILON 10 0 CS140 6 (SUTURE) IMPLANT
SUT ETHILON 9 0 TG140 8 (SUTURE) IMPLANT
SUT POLY NON ABSORB 10-0 8 STR (SUTURE) IMPLANT
SUT SILK 4 0 RB 1 (SUTURE) IMPLANT
SYR 20CC LL (SYRINGE) ×3 IMPLANT
SYR 5ML LL (SYRINGE) IMPLANT
SYR BULB 3OZ (MISCELLANEOUS) ×3 IMPLANT
SYR TB 1ML LUER SLIP (SYRINGE) ×3 IMPLANT
SYRINGE 10CC LL (SYRINGE) IMPLANT
TAPE SURG TRANSPORE 1 IN (GAUZE/BANDAGES/DRESSINGS) ×2 IMPLANT
TAPE SURGICAL TRANSPORE 1 IN (GAUZE/BANDAGES/DRESSINGS) ×1
TOWEL OR 17X24 6PK STRL BLUE (TOWEL DISPOSABLE) ×9 IMPLANT
TROCAR CANNULA 25GA (CANNULA) IMPLANT
WATER STERILE IRR 1000ML POUR (IV SOLUTION) ×3 IMPLANT
WIPE INSTRUMENT VISIWIPE 73X73 (MISCELLANEOUS) ×3 IMPLANT

## 2012-04-03 NOTE — Transfer of Care (Signed)
Immediate Anesthesia Transfer of Care Note  Patient: [name] [name]  Procedure(s) Performed: Procedure(s) (LRB): PARS PLANA VITRECTOMY WITH 25 GAUGE (Right) DIODE LASER APPLICATION (Right)  Patient Location: PACU  Anesthesia Type: General  Level of Consciousness: awake, alert  and oriented  Airway & Oxygen Therapy: Patient Spontanous Breathing and Patient connected to nasal cannula oxygen  Post-op Assessment: Report given to PACU RN, Post -op Vital signs reviewed and stable and Patient moving all extremities  Post vital signs: Reviewed and stable  Complications: No apparent anesthesia complications

## 2012-04-03 NOTE — Anesthesia Procedure Notes (Signed)
Procedure Name: Intubation Date/Time: 04/03/2012 12:03 PM Performed by: Luster Landsberg Pre-anesthesia Checklist: Patient identified, Emergency Drugs available, Suction available and Patient being monitored Patient Re-evaluated:Patient Re-evaluated prior to inductionOxygen Delivery Method: Circle system utilized Preoxygenation: Pre-oxygenation with 100% oxygen Intubation Type: IV induction Ventilation: Mask ventilation without difficulty Laryngoscope Size: Mac and 3 Grade View: Grade I Tube type: Oral Tube size: 7.0 mm Number of attempts: 1 Airway Equipment and Method: Stylet Placement Confirmation: ETT inserted through vocal cords under direct vision,  positive ETCO2 and breath sounds checked- equal and bilateral Secured at: 22 cm Tube secured with: Tape Dental Injury: Teeth and Oropharynx as per pre-operative assessment

## 2012-04-03 NOTE — Anesthesia Postprocedure Evaluation (Signed)
  Anesthesia Post-op Note  Patient: [name] [name]  Procedure(s) Performed: Procedure(s) (LRB): PARS PLANA VITRECTOMY WITH 25 GAUGE (Right) DIODE LASER APPLICATION (Right)  Patient Location: PACU  Anesthesia Type: General  Level of Consciousness: awake, oriented and patient cooperative  Airway and Oxygen Therapy: Patient Spontanous Breathing and Patient connected to nasal cannula oxygen  Post-op Pain: mild  Post-op Assessment: Post-op Vital signs reviewed, Patient's Cardiovascular Status Stable, Respiratory Function Stable, Patent Airway, No signs of Nausea or vomiting and Pain level controlled  Post-op Vital Signs: stable  Complications: No apparent anesthesia complications

## 2012-04-03 NOTE — H&P (Signed)
I examined the patient today and there is no change in the medical status 

## 2012-04-03 NOTE — Preoperative (Signed)
Beta Blockers   Reason not to administer Beta Blockers:Not Applicable 

## 2012-04-03 NOTE — Brief Op Note (Signed)
Brief Operative note   Preoperative diagnosis:  Pre-Op Diagnosis Codes:    * Retinal detachment with retinal defect, unspecified [361.00] Postoperative diagnosis  Post-Op Diagnosis Codes:    * Retinal detachment with retinal defect, unspecified [361.00]  Procedures: Repair of complex traction retinal detachment  Surgeon:  Sherrie George, MD...  Assistant:  Rosalie Doctor SA   Anesthesia: General  Specimen: none  Estimated blood loss:  1cc  Complications: none  Patient sent to PACU in good condition  Composed by Sherrie George MD  Dictation number: [phone]

## 2012-04-03 NOTE — Anesthesia Preprocedure Evaluation (Signed)
Anesthesia Evaluation  Patient identified by MRN, date of birth, ID band Patient awake    Reviewed: Allergy & Precautions, H&P , NPO status , Patient's Chart, lab work & pertinent test results  Airway Mallampati: I TM Distance: >3 FB Neck ROM: full    Dental   Pulmonary          Cardiovascular hypertension, + CAD and + Cardiac Stents + Valvular Problems/Murmurs MR Rhythm:regular Rate:Normal     Neuro/Psych PSYCHIATRIC DISORDERS    GI/Hepatic   Endo/Other    Renal/GU      Musculoskeletal   Abdominal   Peds  Hematology   Anesthesia Other Findings   Reproductive/Obstetrics                           Anesthesia Physical Anesthesia Plan  ASA: III  Anesthesia Plan: General   Post-op Pain Management:    Induction: Intravenous  Airway Management Planned: Oral ETT  Additional Equipment:   Intra-op Plan:   Post-operative Plan: Extubation in OR  Informed Consent: I have reviewed the patients History and Physical, chart, labs and discussed the procedure including the risks, benefits and alternatives for the proposed anesthesia with the patient or authorized representative who has indicated his/her understanding and acceptance.     Plan Discussed with: CRNA, Anesthesiologist and Surgeon  Anesthesia Plan Comments:         Anesthesia Quick Evaluation

## 2012-04-04 ENCOUNTER — Encounter (HOSPITAL_COMMUNITY): Payer: Self-pay | Admitting: Ophthalmology

## 2012-04-04 MED ORDER — HYDROCODONE-ACETAMINOPHEN 5-325 MG PO TABS: 1.0000 | ORAL_TABLET | ORAL | Status: AC | PRN

## 2012-04-04 MED ORDER — BACITRACIN-POLYMYXIN B 500-10000 UNIT/GM OP OINT: 1.0000 "application " | TOPICAL_OINTMENT | Freq: Four times a day (QID) | OPHTHALMIC | Status: AC

## 2012-04-04 MED ORDER — PREDNISOLONE ACETATE 1 % OP SUSP: 1.0000 [drp] | Freq: Four times a day (QID) | OPHTHALMIC | Status: AC

## 2012-04-04 MED ORDER — GATIFLOXACIN 0.5 % OP SOLN: 1.0000 [drp] | Freq: Four times a day (QID) | OPHTHALMIC | Status: DC

## 2012-04-04 NOTE — Progress Notes (Signed)
Patient discharged to home with husband.  Discharge teaching completed including follow up care, medications, activity.  Verbalizes understanding with no further questions.  Discharged by wheelchair with husband. VSS no complaints of pain.

## 2012-04-04 NOTE — Progress Notes (Signed)
04/04/2012, 6:50 AM  Mental Status:  Awake, Alert, Oriented  Anterior segment: Cornea  Clear    Anterior Chamber GAS BUBBLE IN AC    Lens:    IOL  Intra Ocular Pressure 19 mmHg with Tonopen  Vitreous:   Silicone oil  Retina:  Attached Good laser reaction   Impression: Excellent result Retina attached  Final Diagnosis: Principal Problem:  *Rhegmatogenous retinal detachment   Plan: start post operative eye drops.  Discharge to home.  Give post operative instructions  [name] [name] 04/04/2012, 6:50 AM

## 2012-04-04 NOTE — Discharge Summary (Signed)
Discharge summary not needed on [facility] patients per medical records. 

## 2012-04-04 NOTE — Op Note (Signed)
[name] [name], [name] [name]                  ACCOUNT NO.:  [id number]  MEDICAL RECORD NO.:  [id number]  LOCATION:  6N09C                        FACILITY:  [facility]  PHYSICIAN:  Beulah Gandy. Ashley Royalty, M.D. 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  PROCEDURES:  Repair of complex traction retinal detachment in the right eye with pars plana vitrectomy, Perfluoron injection, retinal photocoagulation, gas-fluid exchange, Perfluoron removal, membrane peel, peripheral iridectomy, and silicone oil injection, all in the right eye.  SURGEON:  Beulah Gandy. Ashley Royalty, M.D.  ASSISTANT:  [name] Doctor, MA, SA.  ANESTHESIA:  General.  DETAILS:  Usual prep and drape, 25-gauge trocars placed at 8 and 10 o'clock.  A 20-gauge opening made with 3 layered MVR incision at 2 o'clock.  Provisc placed on the corneal surface.  Pars plana vitrectomy was begun just behind the pseudophakos. Large, white, round clumps of material were encountered and removed under low suction and rapid cutting.  The vitrectomy was carried down to the macular surface and a 25-gauge pick was used to engage the surface membranes and peel them from their attachments to the retinal surface.  The vitrectomy was carried into the mid periphery where additional white clumps were seen and removed under low suction and rapid cutting.  The extreme periphery was then treated with vitrectomy until all vitreous was removed from the vitreous base.  Scleral buckle was in place.  The Perfluoron was injected slowly to reattach the retina.  The fluid egressed through a break at 2 o'clock.  Once the entire retina was attached, the endolaser was positioned in the eye, 712 burns were placed around the retinal periphery, the power was 1000 mW, 1000 microns each, and 0.1 seconds each.  A  total Perfluoron removal with gas injection was performed.  The Perfluoron was rinsed with BSS and then additional Perfluoron and BSS was removed with the silicone-tip suction cutter and the [place] Ophthalmic Brush.  Silicone Oil 5000 Centistokes was then injected into the vitreous cavity for a complete fill.  A peripheral iridectomy was performed with the vitreous cutter at 6 o'clock in the peripheral iris at the iris root.  The instruments were removed from the eye and 9-0 nylon was used to close the sclerotomy site.  The [place] cautery was used for hemostasis and to reposition the conjunctiva at 2 o'clock. Polymyxin and gentamicin were irrigated in the tenon space.  Atropine solution was applied.  Decadron 10 mg was injected into the lower subconjunctival space.  Atropine solution was applied.  Marcaine was injected around the globe for postop pain. Closing pressure was 10 with a [place] tonometer.  Complications none. Duration was 1 hour 15 minutes.  The patient was awakened and taken to recovery in satisfactory condition.     Beulah Gandy. Ashley Royalty, M.D.     JDM/MEDQ  D:  04/03/2012  T:  04/04/2012  Job:  [id number]

## 2012-04-10 ENCOUNTER — Inpatient Hospital Stay (INDEPENDENT_AMBULATORY_CARE_PROVIDER_SITE_OTHER): Payer: Medicare Other | Admitting: Ophthalmology

## 2012-04-10 DIAGNOSIS — H33009 Unspecified retinal detachment with retinal break, unspecified eye: Secondary | ICD-10-CM

## 2012-05-01 ENCOUNTER — Encounter (INDEPENDENT_AMBULATORY_CARE_PROVIDER_SITE_OTHER): Payer: Medicare Other | Admitting: Ophthalmology

## 2012-05-01 DIAGNOSIS — H33009 Unspecified retinal detachment with retinal break, unspecified eye: Secondary | ICD-10-CM

## 2012-07-10 ENCOUNTER — Encounter (INDEPENDENT_AMBULATORY_CARE_PROVIDER_SITE_OTHER): Payer: Medicare Other | Admitting: Ophthalmology

## 2012-07-10 DIAGNOSIS — H35379 Puckering of macula, unspecified eye: Secondary | ICD-10-CM

## 2012-07-10 DIAGNOSIS — H33009 Unspecified retinal detachment with retinal break, unspecified eye: Secondary | ICD-10-CM

## 2012-07-10 DIAGNOSIS — H43819 Vitreous degeneration, unspecified eye: Secondary | ICD-10-CM

## 2012-10-10 ENCOUNTER — Encounter (INDEPENDENT_AMBULATORY_CARE_PROVIDER_SITE_OTHER): Payer: Medicare Other | Admitting: Ophthalmology

## 2012-10-10 DIAGNOSIS — H43819 Vitreous degeneration, unspecified eye: Secondary | ICD-10-CM

## 2012-10-10 DIAGNOSIS — H35379 Puckering of macula, unspecified eye: Secondary | ICD-10-CM

## 2012-10-10 DIAGNOSIS — H33009 Unspecified retinal detachment with retinal break, unspecified eye: Secondary | ICD-10-CM

## 2012-10-25 DIAGNOSIS — N318 Other neuromuscular dysfunction of bladder: Secondary | ICD-10-CM | POA: Insufficient documentation

## 2012-10-25 DIAGNOSIS — N393 Stress incontinence (female) (male): Secondary | ICD-10-CM | POA: Insufficient documentation

## 2012-11-22 DIAGNOSIS — Z853 Personal history of malignant neoplasm of breast: Secondary | ICD-10-CM | POA: Insufficient documentation

## 2013-02-25 ENCOUNTER — Telehealth: Payer: Self-pay | Admitting: Cardiology

## 2013-02-25 NOTE — Telephone Encounter (Signed)
New problem   [facility] need to know if pt was release by cardioglist of did she just never made another appt. Her shoulder sx is 02/26/13 and they need to know prior to sx. Please call and you can leave a detail message if no one answers.

## 2013-02-25 NOTE — Telephone Encounter (Signed)
I spoke with [name] [name] and made her aware that the pt has not been seen by Dr Shirlee Latch since 05/16/2011.  The pt does not have any pending appointments at this time with our office. Per 2012 office note the pt should have been seen in follow-up in October 2013.

## 2013-03-20 ENCOUNTER — Encounter: Payer: Self-pay | Admitting: Physician Assistant

## 2013-03-20 ENCOUNTER — Ambulatory Visit (INDEPENDENT_AMBULATORY_CARE_PROVIDER_SITE_OTHER): Payer: Medicare Other | Admitting: Physician Assistant

## 2013-03-20 VITALS — BP 110/70 | HR 59 | Ht 63.0 in | Wt 157.4 lb

## 2013-03-20 DIAGNOSIS — I10 Essential (primary) hypertension: Secondary | ICD-10-CM

## 2013-03-20 DIAGNOSIS — R011 Cardiac murmur, unspecified: Secondary | ICD-10-CM

## 2013-03-20 DIAGNOSIS — I5032 Chronic diastolic (congestive) heart failure: Secondary | ICD-10-CM

## 2013-03-20 DIAGNOSIS — I359 Nonrheumatic aortic valve disorder, unspecified: Secondary | ICD-10-CM

## 2013-03-20 DIAGNOSIS — Z01818 Encounter for other preprocedural examination: Secondary | ICD-10-CM

## 2013-03-20 DIAGNOSIS — I2510 Atherosclerotic heart disease of native coronary artery without angina pectoris: Secondary | ICD-10-CM

## 2013-03-20 DIAGNOSIS — E785 Hyperlipidemia, unspecified: Secondary | ICD-10-CM

## 2013-03-20 NOTE — Assessment & Plan Note (Signed)
Patient has history of stent to the LAD in 2006. Last cath in 2010 showed 50% mid LAD and patent stent in the LAD similar to study in 2006. Medical therapy recommended. Shows normal LV function. Patient denies any chest pain or cardiac complaints.

## 2013-03-20 NOTE — Assessment & Plan Note (Signed)
No evidence of heart failure. Blood pressure well controlled.

## 2013-03-20 NOTE — Assessment & Plan Note (Signed)
Controlled.  

## 2013-03-20 NOTE — Patient Instructions (Signed)
Your physician recommends that you continue on your current medications as directed. Please refer to the Current Medication list given to you today.  Your physician has requested that you have an echocardiogram. Echocardiography is a painless test that uses sound waves to create images of your heart. It provides your doctor with information about the size and shape of your heart and how well your heart's chambers and valves are working. This procedure takes approximately one hour. There are no restrictions for this procedure. IF ECHO IS OKAY PATIENT IS CLEARED FOR SURGERY.  Your physician recommends that you schedule a follow-up appointment in: 2-3 months with Dr. Shirlee Latch

## 2013-03-20 NOTE — Assessment & Plan Note (Signed)
Patient is here for preoperative clearance before undergoing shoulder surgery. I discussed this patient in detail with Dr. Johney Frame who recommended she have a 2-D echo to reassess LV function and heart murmur. If this is normal she may proceed with surgery.

## 2013-03-20 NOTE — Progress Notes (Signed)
HPI:  This is a 79 year old female patient Dr.McLean who is here for presurgical clearance before undergoing shoulder surgery for rotator cuff tear. She has a history of coronary artery disease status post drug-eluting stent to the LAD in 2006, with cath in 2010 revealing patent proximal LAD stent and 50% mid LAD stenosis similar to prior cath in 2006. She also has history of diastolic CHF with echo in 11/10 showing EF of 55-60% with moderate diastolic dysfunction no regional wall motion abnormalities, mild to moderate TR moderate LAE and mild AI. She has a history of hypertension, hypothyroidism and breast cancer in the early 2000.  The patient comes in today with no cardiac complaints. She denies any chest pain, palpitations, dyspnea, dyspnea on exertion, dizziness, or presyncope. She is very there is throughout the day with her grandchildren but does not exercise on a regular basis. She says she never sits still until she goes to bed at night.  No Known Allergies  Current Outpatient Prescriptions on File Prior to Visit: aspirin 81 MG tablet, Take 81 mg by mouth daily.  , Disp: , Rfl:  atorvastatin (LIPITOR) 40 MG tablet, Take 40 mg by mouth daily., Disp: , Rfl:  hydrochlorothiazide (MICROZIDE) 12.5 MG capsule, Take 1 capsule (12.5 mg total) by mouth daily., Disp: 90 capsule, Rfl: 3 isosorbide mononitrate (IMDUR) 120 MG 24 hr tablet, Take 120 mg by mouth Daily. , Disp: , Rfl:  levothyroxine (SYNTHROID, LEVOTHROID) 75 MCG tablet, Take 75 mcg by mouth daily. , Disp: , Rfl:  losartan (COZAAR) 100 MG tablet, Take 1 tablet (100 mg total) by mouth daily., Disp: 90 tablet, Rfl: 3 metoprolol (TOPROL-XL) 50 MG 24 hr tablet, Take 50 mg by mouth daily.  , Disp: , Rfl:  traZODone (DESYREL) 150 MG tablet, Take 150 mg by mouth at bedtime., Disp: , Rfl:  venlafaxine (EFFEXOR) 75 MG tablet, Take 75 mg by mouth 2 (two) times daily.  , Disp: , Rfl:   No current facility-administered medications on file prior to  visit.   Past Medical History:   Hypercholesterolemia                                         CAD (coronary artery disease)                                  Comment:The patient had a left heart catheterization in              August 2006 showed an EF of 70%, a 70% mid LAD               lesion and 80% distal LAD ledsion, as well as               50% ostial first diagonal lesion.  The patient               did have a drug-eluting stent placed in her mid              LAD at that time.     Diastolic heart failure                                        Comment:Echo 11/10 with EF 55-60%, moderate  diastolic               dysfunction, no regional WMAs, mild to moderate              TR, moderate LAE, mild AI   Hypertension                                                 Hypothyroidism                                               Breast cancer                                                  Comment:treated with mastectomy/tomoxifen in early               2000s   Bronchitis                                                     Comment:hx of   Mental disorder                                              Depression                                                   Frequent urination at night                                  Heart murmur                                                   Comment:"small"   Anginal pain                                                 Type II diabetes mellitus                                      Comment:"controlled w/diet and exercise"   GERD (gastroesophageal reflux disease)                         Comment:"gone since gallbladder took out"   Arthritis  Comment:"little in my thumbs"   Anxiety                                                     Past Surgical History:   Adenosine Myoview                                               Comment:9/09: EF 69% with normal perfusion images               suggesting no  ischemia or infarction.     ULNAR NERVE REPAIR                                              Comment:left arm   PLANTAR FASCIA RELEASE                           1990's         Comment:left   COLONOSCOPY                                                   PARS PLANA VITRECTOMY                            04/03/2012      Comment:right   TONSILLECTOMY AND ADENOIDECTOMY                  1972         APPENDECTOMY                                     1954         CHOLECYSTECTOMY                                  ~ 2004       ABDOMINAL HYSTERECTOMY                           1964         DILATION AND CURETTAGE OF UTERUS                 1960's         Comment:"I had 3"   NEUROPLASTY / TRANSPOSITION MEDIAN NERVE AT [facility]*  1990's       CATARACT EXTRACTION W/ INTRAOCULAR LENS  IMPLA*  ~ 2000       MASTECTOMY                                       2001           Comment:right breast  BREAST BIOPSY                                    2001           Comment:right   CARDIAC CATHETERIZATION                                       CORONARY ANGIOPLASTY                                          CORONARY ANGIOPLASTY WITH STENT PLACEMENT        2000's         Comment:"1"   PARS PLANA VITRECTOMY                            04/03/2012      Comment:Procedure: PARS PLANA VITRECTOMY WITH 25 GAUGE;              Surgeon: Sherrie George, MD;  Location: [facility] OR;              Service: Ophthalmology;  Laterality: Right;                Silicone Oil, Perfluron, Repair Complex               Traction Retinal Detachment  No family history on file.   Social History   Marital Status: Married             Spouse Name:                      Years of Education:                 Number of children:             Occupational History Occupation          Hotel manager provider                        part-time  Social History Main Topics   Smoking Status: Never Smoker                     Smokeless Status:  Never Used                       Alcohol Use: No             Drug Use: No             Sexual Activity: No                 Other Topics            Concern   None on file  Social History Narrative   None on file    ROS: See HPI Eyes: Negative Ears:Negative for hearing loss, tinnitus Cardiovascular: Negative for chest pain, palpitations,irregular heartbeat, dyspnea, dyspnea on exertion, near-syncope, orthopnea, paroxysmal nocturnal dyspnea and syncope,edema, claudication, cyanosis,.  Respiratory:   Negative for  cough, hemoptysis, shortness of breath, sleep disturbances due to breathing, sputum production and wheezing.   Endocrine: Negative for cold intolerance and heat intolerance.  Hematologic/Lymphatic: Negative for adenopathy and bleeding problem. Does not bruise/bleed easily.  Musculoskeletal: Negative.   Gastrointestinal: Negative for nausea, vomiting, reflux, abdominal pain, diarrhea, constipation.   Genitourinary: Negative for bladder incontinence, dysuria, flank pain, frequency, hematuria, hesitancy, nocturia and urgency.  Neurological: Negative.  Allergic/Immunologic: Negative for environmental allergies.   PHYSICAL EXAM: Well-nournished, in no acute distress. Neck: No JVD, HJR, Bruit, or thyroid enlargement  Lungs: No tachypnea, clear without wheezing, rales, or rhonchi  Cardiovascular: RRR, PMI not displaced,  2/6 systolic murmur at the left sternal border, no  gallops, bruit, thrill, or heave.  Abdomen: BS normal. Soft without organomegaly, masses, lesions or tenderness.  Extremities: without cyanosis, clubbing or edema. Good distal pulses bilateral  SKin: Warm, no lesions or rashes   Musculoskeletal: No deformities  Neuro: no focal signs  BP 110/70  Pulse 59  Ht 5\' 3"  (1.6 m)  Wt 157 lb 6.4 oz (71.396 kg)  BMI 27.89 kg/m2   ZOX:WRUEA bradycardia 59 beats per minute no acute change

## 2013-03-22 ENCOUNTER — Ambulatory Visit (HOSPITAL_COMMUNITY): Payer: Medicare Other | Attending: Physician Assistant | Admitting: Radiology

## 2013-03-22 DIAGNOSIS — I2510 Atherosclerotic heart disease of native coronary artery without angina pectoris: Secondary | ICD-10-CM | POA: Insufficient documentation

## 2013-03-22 DIAGNOSIS — I10 Essential (primary) hypertension: Secondary | ICD-10-CM | POA: Insufficient documentation

## 2013-03-22 DIAGNOSIS — R011 Cardiac murmur, unspecified: Secondary | ICD-10-CM | POA: Insufficient documentation

## 2013-03-22 DIAGNOSIS — I359 Nonrheumatic aortic valve disorder, unspecified: Secondary | ICD-10-CM | POA: Insufficient documentation

## 2013-03-22 DIAGNOSIS — E039 Hypothyroidism, unspecified: Secondary | ICD-10-CM | POA: Insufficient documentation

## 2013-03-22 DIAGNOSIS — E785 Hyperlipidemia, unspecified: Secondary | ICD-10-CM | POA: Insufficient documentation

## 2013-03-22 NOTE — Progress Notes (Signed)
Echocardiogram performed.  

## 2013-03-26 DIAGNOSIS — M542 Cervicalgia: Secondary | ICD-10-CM | POA: Insufficient documentation

## 2013-03-29 DIAGNOSIS — M1990 Unspecified osteoarthritis, unspecified site: Secondary | ICD-10-CM | POA: Insufficient documentation

## 2013-03-29 DIAGNOSIS — E1149 Type 2 diabetes mellitus with other diabetic neurological complication: Secondary | ICD-10-CM | POA: Insufficient documentation

## 2013-03-29 DIAGNOSIS — G479 Sleep disorder, unspecified: Secondary | ICD-10-CM | POA: Insufficient documentation

## 2013-03-29 DIAGNOSIS — K219 Gastro-esophageal reflux disease without esophagitis: Secondary | ICD-10-CM | POA: Insufficient documentation

## 2013-03-29 DIAGNOSIS — K760 Fatty (change of) liver, not elsewhere classified: Secondary | ICD-10-CM | POA: Insufficient documentation

## 2013-04-09 ENCOUNTER — Ambulatory Visit (INDEPENDENT_AMBULATORY_CARE_PROVIDER_SITE_OTHER): Payer: Medicare Other | Admitting: Ophthalmology

## 2013-05-09 ENCOUNTER — Ambulatory Visit (INDEPENDENT_AMBULATORY_CARE_PROVIDER_SITE_OTHER): Payer: Medicare Other | Admitting: Ophthalmology

## 2013-05-09 DIAGNOSIS — E11319 Type 2 diabetes mellitus with unspecified diabetic retinopathy without macular edema: Secondary | ICD-10-CM

## 2013-05-09 DIAGNOSIS — H33009 Unspecified retinal detachment with retinal break, unspecified eye: Secondary | ICD-10-CM

## 2013-05-09 DIAGNOSIS — H35039 Hypertensive retinopathy, unspecified eye: Secondary | ICD-10-CM

## 2013-05-09 DIAGNOSIS — H43819 Vitreous degeneration, unspecified eye: Secondary | ICD-10-CM

## 2013-05-09 DIAGNOSIS — I10 Essential (primary) hypertension: Secondary | ICD-10-CM

## 2013-05-09 DIAGNOSIS — E1139 Type 2 diabetes mellitus with other diabetic ophthalmic complication: Secondary | ICD-10-CM

## 2013-05-10 DIAGNOSIS — H35371 Puckering of macula, right eye: Secondary | ICD-10-CM | POA: Diagnosis present

## 2013-05-10 NOTE — H&P (Signed)
[name] [name] is an 79 y.o. female.   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  Now needs silicone oil removed and membrane peel.  Right eye  Past Medical History  Diagnosis Date  . Hypercholesterolemia   . CAD (coronary artery disease)     The patient had a left heart catheterization in August 2006 showed an EF of 70%, a 70% mid LAD lesion and 80% distal LAD ledsion, as well as 50% ostial first diagonal lesion.  The patient did have a drug-eluting stent placed in her mid LAD at that time.    . Diastolic heart failure     Echo 11/10 with EF 55-60%, moderate diastolic dysfunction, no regional WMAs, mild to moderate TR, moderate LAE, mild AI  . Hypertension   . Hypothyroidism   . Breast cancer     treated with mastectomy/tomoxifen in early 2000s  . Bronchitis     hx of  . Mental disorder   . Depression   . Frequent urination at night   . Heart murmur     "small"  . Anginal pain   . Type II diabetes mellitus     "controlled w/diet and exercise"  . GERD (gastroesophageal reflux disease)     "gone since gallbladder took out"  . Arthritis     "little in my thumbs"  . Anxiety     Past Surgical History  Procedure Laterality Date  . Adenosine myoview      9/09: EF 69% with normal perfusion images suggesting no ischemia or infarction.    Marland Kitchen Ulnar nerve repair      left arm  . Plantar fascia release  1990's    left  . Colonoscopy    . Pars plana vitrectomy  04/03/2012    right  . Tonsillectomy and adenoidectomy  1972  . Appendectomy  1954  . Cholecystectomy  ~ 2004  . Abdominal hysterectomy  1964  . Dilation and curettage of uterus  1960's    "I had 3"  . Neuroplasty / transposition median nerve at carpal tunnel bilateral  1990's  . Cataract extraction w/ intraocular lens  implant, bilateral  ~ 2000  . Mastectomy  2001    right  breast  . Breast biopsy  2001    right  . Cardiac catheterization    . Coronary angioplasty    . Coronary angioplasty with stent placement  2000's    "1"  . Pars plana vitrectomy  04/03/2012    Procedure: PARS PLANA VITRECTOMY WITH 25 GAUGE;  Surgeon: Sherrie George, MD;  Location: [facility] OR;  Service: Ophthalmology;  Laterality: Right;  Silicone Oil, Perfluron, Repair Complex Traction Retinal Detachment    No family history on file. Social History:  reports that she has never smoked. She has never used smokeless tobacco. She reports that she does not drink alcohol or use illicit drugs.  Allergies: No Known Allergies  No prescriptions prior to admission    Review of systems otherwise negative  There were no vitals taken for this visit.  Physical exam: Mental status: oriented x3. Eyes: See eye exam associated with this date of surgery in media tab.  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  Right eye Plan: To [facility] for Pars plana vitrectomy with membrane peel and removal of silicone oil  Right eye  Jeannie Mallinger, Beulah Gandy 05/10/2013, 7:31 AM

## 2013-05-28 ENCOUNTER — Encounter: Payer: Self-pay | Admitting: Cardiology

## 2013-05-28 ENCOUNTER — Ambulatory Visit (INDEPENDENT_AMBULATORY_CARE_PROVIDER_SITE_OTHER): Payer: Medicare Other | Admitting: Cardiology

## 2013-05-28 VITALS — BP 154/82 | HR 76 | Ht 63.0 in | Wt 170.0 lb

## 2013-05-28 DIAGNOSIS — I10 Essential (primary) hypertension: Secondary | ICD-10-CM

## 2013-05-28 DIAGNOSIS — I5032 Chronic diastolic (congestive) heart failure: Secondary | ICD-10-CM

## 2013-05-28 DIAGNOSIS — R0683 Snoring: Secondary | ICD-10-CM

## 2013-05-28 DIAGNOSIS — I2510 Atherosclerotic heart disease of native coronary artery without angina pectoris: Secondary | ICD-10-CM

## 2013-05-28 DIAGNOSIS — R0609 Other forms of dyspnea: Secondary | ICD-10-CM

## 2013-05-28 DIAGNOSIS — E785 Hyperlipidemia, unspecified: Secondary | ICD-10-CM

## 2013-05-28 MED ORDER — ATORVASTATIN CALCIUM 80 MG PO TABS: 80.0000 mg | ORAL_TABLET | Freq: Every day | ORAL | Status: DC

## 2013-05-28 NOTE — Patient Instructions (Signed)
Stop Vytorin.  Increase atorvastatin to 80mg  daily. You can take 2 of your 40mg  tablets daily at the same time and use your current.  Your physician has recommended that you have a sleep study. This test records several body functions during sleep, including: brain activity, eye movement, oxygen and carbon dioxide blood levels, heart rate and rhythm, breathing rate and rhythm, the flow of air through your mouth and nose, snoring, body muscle movements, and chest and belly movement.  Your physician recommends that you have a FASTING lipid profile /liver profile in 2 months. I have given you an order for this. Please fax the results to Dr McLean--[phone]   Your physician wants you to follow-up in: 1 year with Dr Shirlee Latch. (October 2015).  You will receive a reminder letter in the mail two months in advance. If you don't receive a letter, please call our office to schedule the follow-up appointment.

## 2013-05-28 NOTE — Progress Notes (Signed)
Patient ID: [name] [name], female   DOB: 1933-10-18, 79 y.o.   MRN: [id number] PCP: Dr. Mikey Bussing [facility])  79 yo with h/o CAD s/p PCI to LAD presents for cardiology followup.  Given chest pain and shortness of breath, she had a left heart cath in 11/10 that showed a patent proximal LAD stent and 50% mid LAD stenosis, similar to prior cath (2006).  Echo in 8/14 showed EF 60-65% with grade II diastolic dysfunction and mild AI.  Since her last appointment here, she had her right rotator cuff repaired with no complications.   Weight is up about 13 lbs since last appointment.  She reports no exercise for the month after surgery. She has not been watching her diet.  BP is high today in the office but SBP at home running in the 120s.  She reports a lack of energy and daytime sleepiness.  She does snore.  She is short of breath walking up steps but ok on flat ground. No chest pain. She is taking both atorvastatin and Vytorin.   Labs (7/10): HDL 72, LDL 59, LFTs normal Labs (4/13): K 3.9, creatinine 0.71  Allergies (verified):  No Known Drug Allergies  Past Medical History: 1. Hypercholesterolemia. 2. Coronary artery disease.  The patient had a left heart catheterization in August 2006 showed an EF of 70%, a 70% mid LAD lesion and 80% distal LAD lesion, as well as a 50% ostial first diagonal lesion.  The patient did have a drug-eluting stent placed in her mid LAD at that time.  She was on Plavix for 2-1/2 years. She stopped Plavix in January 2009.  Adenosine myoview (9/09):  EF 69% with normal perfusion images suggesting no ischemia or infarction.  Repeat LHC for CP (11/10) showed patent prox LAD stent, 50% mLAD stenosis.  Study was similar to 2006.  3. Diastolic CHF: Echo (11/10) with EF 55-60%, moderate diastolic dysfunction, no regional WMAs, mild to moderate TR, moderate LAE, mild AI.  Echo (8/14) with EF 60-65%, grade II diastolic dysfunction, mild AI, PA systolic pressure 35 mmHg.  4. Hypertension. 5.  Hypothyroidism.  6. Breast CA: treated with mastectomy/tamoxifen in early 2000s.   7. Rotator cuff surgery  Family History: Noncontributory.   Social History: The patient works part-time at a day care.  She is a nonsmoker.She lives in [place]. She is married.   ROS: All systems reviewed and negative except as per HPI.   Current Outpatient Prescriptions  Medication Sig Dispense Refill  . aspirin 81 MG tablet Take 81 mg by mouth daily.        . Calcium Carb-Cholecalciferol (CALCIUM-VITAMIN D) 600-400 MG-UNIT TABS Take 600 mg by mouth.      . isosorbide mononitrate (IMDUR) 120 MG 24 hr tablet Take 120 mg by mouth Daily.       Marland Kitchen levothyroxine (SYNTHROID, LEVOTHROID) 75 MCG tablet Take 75 mcg by mouth daily.       . metoprolol (TOPROL-XL) 50 MG 24 hr tablet Take 50 mg by mouth daily.        . traZODone (DESYREL) 150 MG tablet Take 150 mg by mouth at bedtime.      Marland Kitchen venlafaxine (EFFEXOR) 75 MG tablet Take 75 mg by mouth 2 (two) times daily. 2 in am 1 in pm      . atorvastatin (LIPITOR) 80 MG tablet Take 1 tablet (80 mg total) by mouth daily.  90 tablet  1  . hydrochlorothiazide (MICROZIDE) 12.5 MG capsule Take 1 capsule (  12.5 mg total) by mouth daily.  90 capsule  3  . losartan (COZAAR) 100 MG tablet Take 1 tablet (100 mg total) by mouth daily.  90 tablet  3   No current facility-administered medications for this visit.    BP 154/82  Pulse 76  Ht 5\' 3"  (1.6 m)  Wt 77.111 kg (170 lb)  BMI 30.12 kg/m2 General: NAD Neck: No JVD, no thyromegaly or thyroid nodule.  Lungs: Clear to auscultation bilaterally with normal respiratory effort. CV: Nondisplaced PMI.  Heart regular S1/S2, no S3/S4, no murmur.  No peripheral edema.  No carotid bruit.  Normal pedal pulses.  Abdomen: Soft, nontender, no hepatosplenomegaly, no distention.  Neurologic: Alert and oriented x 3.  Psych: Normal affect. Extremities: No clubbing or cyanosis.   Assessment/Plan: 1. HTN: BP has been under good control  at home. No changes.  2. Fatigue: Possible component of OSA given daytime sleepiness and snoring.  I will arrange a sleep study.  3. Hyperlipidemia: Patient is taking both atorvastatin and Vytorin.  She can stop Vytorin.  I will increase atorvastatin to 80 mg daily.   4. CAD: No ischemic symptoms.  Continue ASA, statin.  5. Chronic diastolic CHF: Patient is euvolemic on exam with NYHA class II symptoms.    Marca Ancona 05/28/2013

## 2013-06-03 ENCOUNTER — Encounter (HOSPITAL_COMMUNITY): Payer: Self-pay | Admitting: *Deleted

## 2013-06-03 MED ORDER — CEFAZOLIN SODIUM-DEXTROSE 2-3 GM-% IV SOLR
2.0000 g | INTRAVENOUS | Status: AC
  Administered 2013-06-04: 2 g via INTRAVENOUS
  Filled 2013-06-03: qty 50

## 2013-06-04 ENCOUNTER — Encounter (HOSPITAL_COMMUNITY): Payer: Self-pay | Admitting: *Deleted

## 2013-06-04 ENCOUNTER — Ambulatory Visit (HOSPITAL_COMMUNITY): Payer: Medicare Other

## 2013-06-04 ENCOUNTER — Encounter (HOSPITAL_COMMUNITY)
Admission: RE | Disposition: A | Discharge status: Home / Self Care | Payer: Self-pay | Source: Ambulatory Visit | Attending: Ophthalmology

## 2013-06-04 ENCOUNTER — Encounter (HOSPITAL_COMMUNITY): Payer: Medicare Other | Admitting: Anesthesiology

## 2013-06-04 ENCOUNTER — Observation Stay (HOSPITAL_COMMUNITY)
Admission: RE | Admit: 2013-06-04 | Discharge: 2013-06-05 | Disposition: A | Discharge status: Home / Self Care | Payer: Medicare Other | Source: Ambulatory Visit | Attending: Ophthalmology | Admitting: Ophthalmology

## 2013-06-04 ENCOUNTER — Ambulatory Visit (HOSPITAL_COMMUNITY): Payer: Medicare Other | Admitting: Anesthesiology

## 2013-06-04 DIAGNOSIS — H35379 Puckering of macula, unspecified eye: Secondary | ICD-10-CM

## 2013-06-04 DIAGNOSIS — I10 Essential (primary) hypertension: Secondary | ICD-10-CM | POA: Insufficient documentation

## 2013-06-04 DIAGNOSIS — H43319 Vitreous membranes and strands, unspecified eye: Secondary | ICD-10-CM | POA: Insufficient documentation

## 2013-06-04 DIAGNOSIS — Z23 Encounter for immunization: Secondary | ICD-10-CM | POA: Insufficient documentation

## 2013-06-04 DIAGNOSIS — E119 Type 2 diabetes mellitus without complications: Secondary | ICD-10-CM | POA: Insufficient documentation

## 2013-06-04 DIAGNOSIS — Z961 Presence of intraocular lens: Secondary | ICD-10-CM | POA: Insufficient documentation

## 2013-06-04 DIAGNOSIS — H33009 Unspecified retinal detachment with retinal break, unspecified eye: Secondary | ICD-10-CM

## 2013-06-04 DIAGNOSIS — H35371 Puckering of macula, right eye: Secondary | ICD-10-CM | POA: Diagnosis present

## 2013-06-04 HISTORY — PX: MEMBRANE PEEL: SHX5967

## 2013-06-04 HISTORY — PX: GAS/FLUID EXCHANGE: SHX5334

## 2013-06-04 HISTORY — PX: 25 GAUGE PARS PLANA VITRECTOMY WITH 20 GAUGE MVR PORT: SHX6041

## 2013-06-04 LAB — CBC
HCT: 34.8 % — ABNORMAL LOW (ref 36.0–46.0)
Hemoglobin: 11.8 g/dL — ABNORMAL LOW (ref 12.0–15.0)
MCH: 30.5 pg (ref 26.0–34.0)
MCHC: 33.9 g/dL (ref 30.0–36.0)
MCV: 89.9 fL (ref 78.0–100.0)
Platelets: 209 10*3/uL (ref 150–400)
RBC: 3.87 MIL/uL (ref 3.87–5.11)
RDW: 12.8 % (ref 11.5–15.5)
WBC: 4.6 10*3/uL (ref 4.0–10.5)

## 2013-06-04 LAB — BASIC METABOLIC PANEL
BUN: 15 mg/dL (ref 6–23)
CO2: 28 mEq/L (ref 19–32)
Calcium: 9.6 mg/dL (ref 8.4–10.5)
Chloride: 101 mEq/L (ref 96–112)
Creatinine, Ser: 0.78 mg/dL (ref 0.50–1.10)
GFR calc Af Amer: >90 mL/min (ref >90)
GFR calc non Af Amer: 79 mL/min — ABNORMAL LOW (ref >90)
Glucose, Bld: 91 mg/dL (ref 70–99)
Potassium: 3.5 mEq/L (ref 3.5–5.1)
Sodium: 138 mEq/L (ref 135–145)

## 2013-06-04 LAB — GLUCOSE, CAPILLARY
Glucose-Capillary: 78 mg/dL (ref 70–99)
Glucose-Capillary: 79 mg/dL (ref 70–99)
Glucose-Capillary: 82 mg/dL (ref 70–99)
Glucose-Capillary: 71 mg/dL (ref 70–99)
Glucose-Capillary: 73 mg/dL (ref 70–99)
Glucose-Capillary: 84 mg/dL (ref 70–99)

## 2013-06-04 SURGERY — 25 GAUGE PARS PLANA VITRECTOMY WITH 20 GAUGE MVR PORT
Anesthesia: General | Site: Eye | Laterality: Right | Wound class: Clean

## 2013-06-04 MED ORDER — CYCLOPENTOLATE HCL 1 % OP SOLN
1.0000 [drp] | OPHTHALMIC | Status: AC | PRN
  Administered 2013-06-04 (×3): 1 [drp] via OPHTHALMIC

## 2013-06-04 MED ORDER — SODIUM HYALURONATE 10 MG/ML IO SOLN
INTRAOCULAR | Status: AC
  Filled 2013-06-04: qty 0.85

## 2013-06-04 MED ORDER — ATROPINE SULFATE 1 % OP SOLN
OPHTHALMIC | Status: AC
  Filled 2013-06-04: qty 2

## 2013-06-04 MED ORDER — ISOSORBIDE MONONITRATE ER 60 MG PO TB24
120.0000 mg | ORAL_TABLET | Freq: Every morning | ORAL | Status: DC
  Filled 2013-06-04: qty 2

## 2013-06-04 MED ORDER — BACITRACIN-POLYMYXIN B 500-10000 UNIT/GM OP OINT
1.0000 "application " | TOPICAL_OINTMENT | Freq: Four times a day (QID) | OPHTHALMIC | Status: DC
  Filled 2013-06-04: qty 3.5

## 2013-06-04 MED ORDER — BACITRACIN-POLYMYXIN B 500-10000 UNIT/GM OP OINT
TOPICAL_OINTMENT | OPHTHALMIC | Status: AC
  Filled 2013-06-04: qty 3.5

## 2013-06-04 MED ORDER — LOSARTAN POTASSIUM 50 MG PO TABS
100.0000 mg | ORAL_TABLET | Freq: Every day | ORAL | Status: DC
  Filled 2013-06-04: qty 2

## 2013-06-04 MED ORDER — GLYCOPYRROLATE 0.2 MG/ML IJ SOLN
INTRAMUSCULAR | Status: DC | PRN
  Administered 2013-06-04: 0.4 mg via INTRAVENOUS

## 2013-06-04 MED ORDER — SODIUM HYALURONATE 10 MG/ML IO SOLN
INTRAOCULAR | Status: DC | PRN
  Administered 2013-06-04: 0.85 mL via INTRAOCULAR

## 2013-06-04 MED ORDER — PROPOFOL 10 MG/ML IV BOLUS
INTRAVENOUS | Status: DC | PRN
  Administered 2013-06-04: 130 mg via INTRAVENOUS

## 2013-06-04 MED ORDER — BACITRACIN-POLYMYXIN B 500-10000 UNIT/GM OP OINT
TOPICAL_OINTMENT | OPHTHALMIC | Status: DC | PRN
  Administered 2013-06-04: 1 via OPHTHALMIC

## 2013-06-04 MED ORDER — SODIUM CHLORIDE 0.45 % IV SOLN
INTRAVENOUS | Status: DC
  Administered 2013-06-04: 21:00:00 via INTRAVENOUS

## 2013-06-04 MED ORDER — TRIAMCINOLONE ACETONIDE 40 MG/ML IJ SUSP
INTRAMUSCULAR | Status: AC
  Filled 2013-06-04: qty 5

## 2013-06-04 MED ORDER — GATIFLOXACIN 0.5 % OP SOLN
1.0000 [drp] | Freq: Four times a day (QID) | OPHTHALMIC | Status: DC
  Filled 2013-06-04: qty 2.5

## 2013-06-04 MED ORDER — PHENYLEPHRINE HCL 2.5 % OP SOLN
1.0000 [drp] | OPHTHALMIC | Status: AC | PRN
  Administered 2013-06-04 (×3): 1 [drp] via OPHTHALMIC
  Filled 2013-06-04: qty 2

## 2013-06-04 MED ORDER — ACETAMINOPHEN 325 MG PO TABS: 325.0000 mg | ORAL_TABLET | ORAL | Status: DC | PRN

## 2013-06-04 MED ORDER — LEVOTHYROXINE SODIUM 75 MCG PO TABS
75.0000 ug | ORAL_TABLET | Freq: Every day | ORAL | Status: DC
  Filled 2013-06-04 (×2): qty 1

## 2013-06-04 MED ORDER — HYDROCHLOROTHIAZIDE 12.5 MG PO CAPS
12.5000 mg | ORAL_CAPSULE | Freq: Every day | ORAL | Status: DC
  Filled 2013-06-04: qty 1

## 2013-06-04 MED ORDER — GATIFLOXACIN 0.5 % OP SOLN
1.0000 [drp] | OPHTHALMIC | Status: AC | PRN
  Administered 2013-06-04 (×3): 1 [drp] via OPHTHALMIC
  Filled 2013-06-04: qty 2.5

## 2013-06-04 MED ORDER — NEOSTIGMINE METHYLSULFATE 1 MG/ML IJ SOLN
INTRAMUSCULAR | Status: DC | PRN
  Administered 2013-06-04: 3 mg via INTRAVENOUS

## 2013-06-04 MED ORDER — PROMETHAZINE HCL 25 MG/ML IJ SOLN: 6.2500 mg | INTRAMUSCULAR | Status: DC | PRN

## 2013-06-04 MED ORDER — DOCUSATE SODIUM 100 MG PO CAPS
100.0000 mg | ORAL_CAPSULE | Freq: Two times a day (BID) | ORAL | Status: DC
  Administered 2013-06-04: 100 mg via ORAL
  Filled 2013-06-04: qty 1

## 2013-06-04 MED ORDER — SODIUM CHLORIDE 0.9 % IV SOLN
INTRAVENOUS | Status: DC | PRN
  Administered 2013-06-04: 15:00:00 via INTRAVENOUS

## 2013-06-04 MED ORDER — BSS IO SOLN
INTRAOCULAR | Status: AC
  Filled 2013-06-04: qty 15

## 2013-06-04 MED ORDER — TETRACAINE HCL 0.5 % OP SOLN
2.0000 [drp] | Freq: Once | OPHTHALMIC | Status: DC
  Filled 2013-06-04: qty 2

## 2013-06-04 MED ORDER — VENLAFAXINE HCL 75 MG PO TABS
150.0000 mg | ORAL_TABLET | Freq: Every morning | ORAL | Status: DC
  Filled 2013-06-04: qty 2

## 2013-06-04 MED ORDER — ACETAZOLAMIDE SODIUM 500 MG IJ SOLR
500.0000 mg | Freq: Once | INTRAMUSCULAR | Status: AC
  Administered 2013-06-05: 500 mg via INTRAVENOUS
  Filled 2013-06-04: qty 500

## 2013-06-04 MED ORDER — SODIUM CHLORIDE 0.9 % IJ SOLN
INTRAMUSCULAR | Status: DC | PRN
  Administered 2013-06-04: 16:00:00

## 2013-06-04 MED ORDER — EZETIMIBE-SIMVASTATIN 10-40 MG PO TABS
1.0000 | ORAL_TABLET | Freq: Every day | ORAL | Status: DC
  Administered 2013-06-04: 1 via ORAL
  Filled 2013-06-04 (×2): qty 1

## 2013-06-04 MED ORDER — DEXAMETHASONE SODIUM PHOSPHATE 10 MG/ML IJ SOLN
INTRAMUSCULAR | Status: DC | PRN
  Administered 2013-06-04: 10 mg via INTRAVENOUS

## 2013-06-04 MED ORDER — FENTANYL CITRATE 0.05 MG/ML IJ SOLN
INTRAMUSCULAR | Status: DC | PRN
  Administered 2013-06-04 (×2): 50 ug via INTRAVENOUS

## 2013-06-04 MED ORDER — BRIMONIDINE TARTRATE 0.2 % OP SOLN
1.0000 [drp] | Freq: Two times a day (BID) | OPHTHALMIC | Status: DC
  Filled 2013-06-04: qty 5

## 2013-06-04 MED ORDER — BSS IO SOLN
INTRAOCULAR | Status: DC | PRN
  Administered 2013-06-04: 15 mL via INTRAOCULAR

## 2013-06-04 MED ORDER — ROCURONIUM BROMIDE 100 MG/10ML IV SOLN
INTRAVENOUS | Status: DC | PRN
  Administered 2013-06-04: 30 mg via INTRAVENOUS

## 2013-06-04 MED ORDER — MORPHINE SULFATE 2 MG/ML IJ SOLN: 1.0000 mg | INTRAMUSCULAR | Status: DC | PRN

## 2013-06-04 MED ORDER — MINERAL OIL LIGHT 100 % EX OIL
TOPICAL_OIL | CUTANEOUS | Status: AC
  Filled 2013-06-04: qty 25

## 2013-06-04 MED ORDER — TROPICAMIDE 1 % OP SOLN
1.0000 [drp] | OPHTHALMIC | Status: AC | PRN
  Administered 2013-06-04 (×3): 1 [drp] via OPHTHALMIC
  Filled 2013-06-04: qty 3

## 2013-06-04 MED ORDER — ACETAZOLAMIDE SODIUM 500 MG IJ SOLR
INTRAMUSCULAR | Status: AC
  Filled 2013-06-04: qty 500

## 2013-06-04 MED ORDER — ATORVASTATIN CALCIUM 80 MG PO TABS
80.0000 mg | ORAL_TABLET | Freq: Every day | ORAL | Status: DC
  Filled 2013-06-04: qty 1

## 2013-06-04 MED ORDER — BSS PLUS IO SOLN
INTRAOCULAR | Status: DC | PRN
  Administered 2013-06-04: 1 via INTRAOCULAR

## 2013-06-04 MED ORDER — GENTAMICIN SULFATE 40 MG/ML IJ SOLN
INTRAMUSCULAR | Status: AC
  Filled 2013-06-04: qty 2

## 2013-06-04 MED ORDER — SODIUM CHLORIDE 0.9 % IV SOLN
INTRAVENOUS | Status: DC
  Administered 2013-06-04: 10:00:00 via INTRAVENOUS

## 2013-06-04 MED ORDER — OXYCODONE HCL 5 MG PO TABS: 5.0000 mg | ORAL_TABLET | Freq: Once | ORAL | Status: DC | PRN

## 2013-06-04 MED ORDER — BUPIVACAINE HCL (PF) 0.75 % IJ SOLN
INTRAMUSCULAR | Status: AC
  Filled 2013-06-04: qty 10

## 2013-06-04 MED ORDER — METOPROLOL SUCCINATE ER 50 MG PO TB24
50.0000 mg | ORAL_TABLET | Freq: Every day | ORAL | Status: DC
  Filled 2013-06-04: qty 1

## 2013-06-04 MED ORDER — SODIUM CHLORIDE 0.9 % IJ SOLN
INTRAMUSCULAR | Status: AC
  Filled 2013-06-04: qty 10

## 2013-06-04 MED ORDER — ATROPINE SULFATE 1 % OP SOLN
OPHTHALMIC | Status: DC | PRN
  Administered 2013-06-04: 1 [drp] via OPHTHALMIC

## 2013-06-04 MED ORDER — HYDROCODONE-ACETAMINOPHEN 5-325 MG PO TABS
1.0000 | ORAL_TABLET | ORAL | Status: DC | PRN
  Administered 2013-06-04: 1 via ORAL
  Filled 2013-06-04: qty 1

## 2013-06-04 MED ORDER — VENLAFAXINE HCL 75 MG PO TABS: 75.0000 mg | ORAL_TABLET | Freq: Two times a day (BID) | ORAL | Status: DC

## 2013-06-04 MED ORDER — ARTIFICIAL TEARS OP OINT
TOPICAL_OINTMENT | OPHTHALMIC | Status: DC | PRN
  Administered 2013-06-04: 1 via OPHTHALMIC

## 2013-06-04 MED ORDER — BUPIVACAINE-EPINEPHRINE PF 0.25-1:200000 % IJ SOLN
INTRAMUSCULAR | Status: AC
  Filled 2013-06-04: qty 30

## 2013-06-04 MED ORDER — TRAZODONE HCL 150 MG PO TABS
150.0000 mg | ORAL_TABLET | Freq: Every day | ORAL | Status: DC
  Administered 2013-06-04: 150 mg via ORAL
  Filled 2013-06-04 (×2): qty 1

## 2013-06-04 MED ORDER — MAGNESIUM HYDROXIDE 400 MG/5ML PO SUSP: 15.0000 mL | Freq: Four times a day (QID) | ORAL | Status: DC | PRN

## 2013-06-04 MED ORDER — HYALURONIDASE HUMAN 150 UNIT/ML IJ SOLN
INTRAMUSCULAR | Status: AC
  Filled 2013-06-04: qty 1

## 2013-06-04 MED ORDER — LATANOPROST 0.005 % OP SOLN
1.0000 [drp] | Freq: Every day | OPHTHALMIC | Status: DC
Start: 2013-06-05 — End: 2013-06-05
  Filled 2013-06-04: qty 2.5

## 2013-06-04 MED ORDER — BUPIVACAINE HCL (PF) 0.75 % IJ SOLN
INTRAMUSCULAR | Status: DC | PRN
  Administered 2013-06-04: 10 mL

## 2013-06-04 MED ORDER — HYDROMORPHONE HCL PF 1 MG/ML IJ SOLN: 0.2500 mg | INTRAMUSCULAR | Status: DC | PRN

## 2013-06-04 MED ORDER — VENLAFAXINE HCL 75 MG PO TABS
75.0000 mg | ORAL_TABLET | Freq: Every evening | ORAL | Status: DC
  Administered 2013-06-04: 75 mg via ORAL
  Filled 2013-06-04 (×2): qty 1

## 2013-06-04 MED ORDER — EPINEPHRINE HCL 1 MG/ML IJ SOLN
INTRAMUSCULAR | Status: AC
  Filled 2013-06-04: qty 1

## 2013-06-04 MED ORDER — ONDANSETRON HCL 4 MG/2ML IJ SOLN: 4.0000 mg | Freq: Four times a day (QID) | INTRAMUSCULAR | Status: DC | PRN

## 2013-06-04 MED ORDER — PREDNISOLONE ACETATE 1 % OP SUSP
1.0000 [drp] | Freq: Four times a day (QID) | OPHTHALMIC | Status: DC
  Filled 2013-06-04: qty 1

## 2013-06-04 MED ORDER — BSS PLUS IO SOLN
INTRAOCULAR | Status: AC
  Filled 2013-06-04: qty 500

## 2013-06-04 MED ORDER — TEMAZEPAM 15 MG PO CAPS: 15.0000 mg | ORAL_CAPSULE | Freq: Every evening | ORAL | Status: DC | PRN

## 2013-06-04 MED ORDER — LIDOCAINE HCL (CARDIAC) 20 MG/ML IV SOLN
INTRAVENOUS | Status: DC | PRN
  Administered 2013-06-04: 100 mg via INTRAVENOUS

## 2013-06-04 MED ORDER — POLYMYXIN B SULFATE 500000 UNITS IJ SOLR
INTRAMUSCULAR | Status: AC
  Filled 2013-06-04: qty 1

## 2013-06-04 MED ORDER — ONDANSETRON HCL 4 MG/2ML IJ SOLN
INTRAMUSCULAR | Status: DC | PRN
  Administered 2013-06-04: 4 mg via INTRAVENOUS

## 2013-06-04 MED ORDER — OXYCODONE HCL 5 MG/5ML PO SOLN: 5.0000 mg | Freq: Once | ORAL | Status: DC | PRN

## 2013-06-04 MED ORDER — EPINEPHRINE HCL 1 MG/ML IJ SOLN
INTRAMUSCULAR | Status: DC | PRN
  Administered 2013-06-04: 1 mg

## 2013-06-04 MED ORDER — DEXAMETHASONE SODIUM PHOSPHATE 10 MG/ML IJ SOLN
INTRAMUSCULAR | Status: AC
  Filled 2013-06-04: qty 1

## 2013-06-04 SURGICAL SUPPLY — 68 items
APPLICATOR DR MATTHEWS STRL (MISCELLANEOUS) IMPLANT
BLADE EYE CATARACT 19 1.4 BEAV (BLADE) IMPLANT
BLADE MVR KNIFE 19G (BLADE) ×2 IMPLANT
BLADE MVR KNIFE 20G (BLADE) IMPLANT
CANNULA DUAL BORE 23G (CANNULA) IMPLANT
CANNULA VLV SOFT TIP 25GA (OPHTHALMIC) ×4 IMPLANT
CLOTH BEACON ORANGE TIMEOUT ST (SAFETY) IMPLANT
CORDS BIPOLAR (ELECTRODE) ×2 IMPLANT
COTTONBALL LRG STERILE PKG (GAUZE/BANDAGES/DRESSINGS) ×6 IMPLANT
COVER MAYO STAND STRL (DRAPES) ×2 IMPLANT
DRAPE INCISE 51X51 W/FILM STRL (DRAPES) ×2 IMPLANT
DRAPE OPHTHALMIC 77X100 STRL (CUSTOM PROCEDURE TRAY) ×2 IMPLANT
FILTER BLUE MILLIPORE (MISCELLANEOUS) IMPLANT
FILTER STRAW FLUID ASPIR (MISCELLANEOUS) IMPLANT
FORCEPS ECKARDT ILM 25G SERR (OPHTHALMIC RELATED) ×2 IMPLANT
FORCEPS GRIESHABER ILM 25G A (INSTRUMENTS) ×2 IMPLANT
GLOVE SS BIOGEL STRL SZ 6.5 (GLOVE) ×1 IMPLANT
GLOVE SS BIOGEL STRL SZ 7 (GLOVE) ×1 IMPLANT
GLOVE SUPERSENSE BIOGEL SZ 6.5 (GLOVE) ×1
GLOVE SUPERSENSE BIOGEL SZ 7 (GLOVE) ×1
GLOVE SURG 8.5 LATEX PF (GLOVE) ×2 IMPLANT
GOWN STRL NON-REIN LRG LVL3 (GOWN DISPOSABLE) ×6 IMPLANT
HANDLE PNEUMATIC FOR CONSTEL (OPHTHALMIC) ×2 IMPLANT
KIT BASIN OR (CUSTOM PROCEDURE TRAY) ×2 IMPLANT
KIT ROOM TURNOVER OR (KITS) ×2 IMPLANT
KNIFE CRESCENT 2.5 55 ANG (BLADE) ×2 IMPLANT
MASK EYE SHIELD (GAUZE/BANDAGES/DRESSINGS) ×2 IMPLANT
MICROPICK 25G (MISCELLANEOUS)
NEEDLE 18GX1X1/2 (RX/OR ONLY) (NEEDLE) ×2 IMPLANT
NEEDLE 25GX 5/8IN NON SAFETY (NEEDLE) ×2 IMPLANT
NEEDLE 27GAX1X1/2 (NEEDLE) IMPLANT
NEEDLE FILTER BLUNT 18X 1/2SAF (NEEDLE) ×1
NEEDLE FILTER BLUNT 18X1 1/2 (NEEDLE) ×1 IMPLANT
NEEDLE HYPO 30X.5 LL (NEEDLE) ×4 IMPLANT
NS IRRIG 1000ML POUR BTL (IV SOLUTION) ×2 IMPLANT
PACK VITRECTOMY CUSTOM (CUSTOM PROCEDURE TRAY) ×2 IMPLANT
PAD ARMBOARD 7.5X6 YLW CONV (MISCELLANEOUS) ×4 IMPLANT
PAD EYE OVAL STERILE LF (GAUZE/BANDAGES/DRESSINGS) ×2 IMPLANT
PAK PIK VITRECTOMY CVS 25GA (OPHTHALMIC) ×2 IMPLANT
PENCIL BIPOLAR 25GA STR DISP (OPHTHALMIC RELATED) IMPLANT
PIC ILLUMINATED 25G (OPHTHALMIC) ×4
PICK MICROPICK 25G (MISCELLANEOUS) IMPLANT
PIK ILLUMINATED 25G (OPHTHALMIC) ×2 IMPLANT
PROBE LASER ILLUM FLEX CVD 25G (OPHTHALMIC) ×2 IMPLANT
REPL STRA BRUSH NEEDLE (NEEDLE) ×2 IMPLANT
RESERVOIR BACK FLUSH (MISCELLANEOUS) ×2 IMPLANT
RETAINER VISCERA MED (MISCELLANEOUS) ×2 IMPLANT
ROLLS DENTAL (MISCELLANEOUS) ×4 IMPLANT
SCRAPER DIAMOND DUST MEMBRANE (MISCELLANEOUS) ×2 IMPLANT
SET INJECTOR OIL FLUID CONSTEL (OPHTHALMIC) ×2 IMPLANT
SPONGE SURGIFOAM ABS GEL 12-7 (HEMOSTASIS) ×4 IMPLANT
STOPCOCK 4 WAY LG BORE MALE ST (IV SETS) IMPLANT
SUT CHROMIC 7 0 TG140 8 (SUTURE) IMPLANT
SUT ETHILON 10 0 CS140 6 (SUTURE) IMPLANT
SUT ETHILON 9 0 TG140 8 (SUTURE) ×2 IMPLANT
SUT POLY NON ABSORB 10-0 8 STR (SUTURE) IMPLANT
SUT SILK 4 0 RB 1 (SUTURE) IMPLANT
SYR 20CC LL (SYRINGE) ×2 IMPLANT
SYR 5ML LL (SYRINGE) IMPLANT
SYR BULB 3OZ (MISCELLANEOUS) ×2 IMPLANT
SYR TB 1ML LUER SLIP (SYRINGE) ×2 IMPLANT
SYRINGE 10CC LL (SYRINGE) IMPLANT
TAPE SURG TRANSPORE 1 IN (GAUZE/BANDAGES/DRESSINGS) ×1 IMPLANT
TAPE SURGICAL TRANSPORE 1 IN (GAUZE/BANDAGES/DRESSINGS) ×1
TOWEL OR 17X24 6PK STRL BLUE (TOWEL DISPOSABLE) ×4 IMPLANT
TROCAR CANNULA 25GA (CANNULA) IMPLANT
WATER STERILE IRR 1000ML POUR (IV SOLUTION) ×2 IMPLANT
WIPE INSTRUMENT VISIWIPE 73X73 (MISCELLANEOUS) ×2 IMPLANT

## 2013-06-04 NOTE — Brief Op Note (Signed)
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  Assistant:  Rosalie Doctor SA  Charyl Dancer RN  Anesthesia: General  Specimen: none  Estimated blood loss:  1cc  Complications: none  Patient sent to PACU in good condition  Composed by Sherrie George MD  Dictation number: [id number]

## 2013-06-04 NOTE — Anesthesia Postprocedure Evaluation (Signed)
  Anesthesia Post-op Note  Patient: [name] [name]  Procedure(s) Performed: Procedure(s): 25 GAUGE PARS PLANA VITRECTOMY WITH 20 GAUGE MVR PORT / REMOVAL SILICONE OIL RIGHT EYE. With Endolaser (Right) MEMBRANE PEEL (Right) GAS/FLUID EXCHANGE (Right)  Patient Location: PACU  Anesthesia Type:General  Level of Consciousness: awake, alert  and oriented  Airway and Oxygen Therapy: Patient Spontanous Breathing  Post-op Pain: none  Post-op Assessment: Post-op Vital signs reviewed  Post-op Vital Signs: Reviewed  Complications: No apparent anesthesia complications

## 2013-06-04 NOTE — Anesthesia Preprocedure Evaluation (Signed)
Anesthesia Evaluation  Patient identified by MRN, date of birth, ID band Patient awake    Reviewed: Allergy & Precautions, H&P , NPO status , Patient's Chart, lab work & pertinent test results  History of Anesthesia Complications Negative for: history of anesthetic complications  Airway Mallampati: II TM Distance: >3 FB Neck ROM: Full    Dental  (+) Edentulous Upper and Dental Advisory Given   Pulmonary neg pulmonary ROS,    Pulmonary exam normal       Cardiovascular hypertension, + angina + CAD + Systolic murmurs The patient had a left heart catheterization in August 2006 showed an EF of 70%, a 70% mid LAD lesion and 80% distal LAD ledsion, as well as 50% ostial first diagonal lesion.  The patient did have a drug-eluting stent placed in her mid LAD at that time. Echo 11/10 with EF 55-60%, moderate diastolic dysfunction, no regional WMAs, mild to moderate TR, moderate LAE, mild AI   Neuro/Psych PSYCHIATRIC DISORDERS Anxiety Depression negative neurological ROS     GI/Hepatic Neg liver ROS, GERD-  Controlled,  Endo/Other  diabetesHypothyroidism   Renal/GU negative Renal ROS     Musculoskeletal   Abdominal   Peds  Hematology   Anesthesia Other Findings   Reproductive/Obstetrics                           Anesthesia Physical Anesthesia Plan  ASA: III  Anesthesia Plan: General   Post-op Pain Management:    Induction: Intravenous  Airway Management Planned: Oral ETT  Additional Equipment:   Intra-op Plan:   Post-operative Plan: Extubation in OR  Informed Consent: I have reviewed the patients History and Physical, chart, labs and discussed the procedure including the risks, benefits and alternatives for the proposed anesthesia with the patient or authorized representative who has indicated his/her understanding and acceptance.   Dental advisory given  Plan Discussed with: CRNA,  Anesthesiologist and Surgeon  Anesthesia Plan Comments:         Anesthesia Quick Evaluation

## 2013-06-04 NOTE — Preoperative (Signed)
Beta Blockers   Reason not to administer Beta Blockers:Not Applicable 

## 2013-06-04 NOTE — Transfer of Care (Signed)
Immediate Anesthesia Transfer of Care Note  Patient: Keyon Winnick Lemme  Procedure(s) Performed: Procedure(s): 25 GAUGE PARS PLANA VITRECTOMY WITH 20 GAUGE MVR PORT / REMOVAL SILICONE OIL RIGHT EYE. With Endolaser (Right) MEMBRANE PEEL (Right) GAS/FLUID EXCHANGE (Right)  Patient Location: PACU  Anesthesia Type:General  Level of Consciousness: awake, alert , oriented and patient cooperative  Airway & Oxygen Therapy: Patient Spontanous Breathing and Patient connected to nasal cannula oxygen  Post-op Assessment: Report given to PACU RN, Post -op Vital signs reviewed and stable and Patient moving all extremities  Post vital signs: Reviewed and stable  Complications: No apparent anesthesia complications

## 2013-06-04 NOTE — H&P (Signed)
I examined the patient today and there is no change in the medical status 

## 2013-06-04 NOTE — Addendum Note (Signed)
Addendum created 06/04/13 2003 by Remonia Richter, MD   Modules edited: Anesthesia Attestations, Anesthesia Events

## 2013-06-05 MED ORDER — BACITRACIN-POLYMYXIN B 500-10000 UNIT/GM OP OINT: 1.0000 "application " | TOPICAL_OINTMENT | Freq: Four times a day (QID) | OPHTHALMIC | Status: DC

## 2013-06-05 MED ORDER — INFLUENZA VAC SPLIT QUAD 0.5 ML IM SUSP
0.5000 mL | INTRAMUSCULAR | Status: AC
  Administered 2013-06-05: 0.5 mL via INTRAMUSCULAR
  Filled 2013-06-05: qty 0.5

## 2013-06-05 MED ORDER — GATIFLOXACIN 0.5 % OP SOLN: 1.0000 [drp] | Freq: Four times a day (QID) | OPHTHALMIC | Status: DC

## 2013-06-05 MED ORDER — PREDNISOLONE ACETATE 1 % OP SUSP: 1.0000 [drp] | Freq: Four times a day (QID) | OPHTHALMIC | Status: DC

## 2013-06-05 MED ORDER — INFLUENZA VAC SPLIT QUAD 0.5 ML IM SUSP: 0.5000 mL | INTRAMUSCULAR | Status: DC

## 2013-06-05 NOTE — Op Note (Signed)
NAMECHESSA, BARRASSO                  ACCOUNT NO.:  [id number]  MEDICAL RECORD NO.:  [id number]  LOCATION:  6N19C                        FACILITY:  [facility]  PHYSICIAN:  Beulah Gandy. Ashley Royalty, M.D. DATE OF BIRTH:  March 26, 1934  DATE OF PROCEDURE:  06/04/2013 DATE OF DISCHARGE:                              OPERATIVE REPORT   ADMISSION DIAGNOSIS:  Preretinal fibrosis and silicone oil in the right eye.  PROCEDURES:  Pars plana vitrectomy with membrane peel, retinal photocoagulation, removal of silicone oil, all in the right eye.  SURGEON:  Beulah Gandy. Ashley Royalty, M.D.  ASSISTANT:  Rosalie Doctor, SA.  ANESTHESIA:  General.  DETAILS:  After usual prep and drape, 25-gauge trocar was placed at 8 and 10 o'clock.  MVR 19-gauge 3-layered incision performed at 2 o'clock to admit the vitrectomy cutter and the thick end silicone oil extractor. Tessar lens ring was anchored into place at 6 and 12 o'clock.  Provisc placed on the corneal surface and the flat contact lens ring was placed. Pars plana vitrectomy was begun just behind the crystalline lens or the pseudophakic lens where bubbles and the vitreous oil were removed. Attention was carried down to the macular surface where a large wide disk of fibrosis was seen lying on the retinal surface.  This disk was engaged with the 25-gauge pick and lifted free from its attachment to the macular region.  It was stripped across the macula and removed. There was no bleeding.  Additional vitrectomy was carried out in the area of the macula where vitreous membranes were encountered.  The silicone oil was then extracted with the silicone oil extractor in a very slow careful method watching the periphery.  The retina remained attached the entire time.  All oil was removed.  The eye was tilted towards the 2 o'clock sclerotomy sites and the oil would elevate and lift into the extractor.  Once all the oil was removed, the vitrector was repositioned in the eye, a small  droplets of oil were removed from the aqueous phase of the vitreous cavity.  The endolaser was positioned in the eye, 303 burns were placed around the retinal periphery.  The power was 400 once 1000 microns each at 0.1 seconds each.  The retina was in place on the scleral buckle.  The macula was flat.  The instruments were removed from the eye.  The 25-gauge trocars were removed from the eye, and they were tested and found to be secure.  The sclerotomy site was closed with 9-0 nylon, and wet-field cautery was used to close the conjunctiva.  Polymyxin and gentamicin were irrigated into tenon space.  Atropine solution was applied.  Marcaine was injected around the globe for postop pain.  Closing pressure was 10 with a Risk manager.  Complications were none.  Duration 1 hour. Polysporin patch and shield were placed.  The patient was awakened and taken to recovery in satisfactory condition.     Beulah Gandy. Ashley Royalty, M.D.     JDM/MEDQ  D:  06/04/2013  T:  06/05/2013  Job:  [id number]

## 2013-06-05 NOTE — Discharge Summary (Signed)
Discharge summary not needed on [facility] patients per medical records. 

## 2013-06-05 NOTE — Progress Notes (Signed)
06/05/2013, 6:54 AM  Mental Status:  Awake, Alert, Oriented  Anterior segment: Cornea  Clear    Anterior Chamber Clear    Lens:    IOL  Intra Ocular Pressure 15 mmHg with [place]open  Vitreous: Clear 30%gas bubble   Retina:  Attached Good laser reaction   Impression: Excellent result Retina attached Poor view  Final Diagnosis: Principal Problem:   Preretinal fibrosis, right eye   Plan: start post operative eye drops.  Discharge to home.  Give post operative instructions  Sherrie George 06/05/2013, 6:54 AM

## 2013-06-05 NOTE — Progress Notes (Signed)
Patient and husband given discharge instructions.  They verbalized understanding of all instructions and follow-up appointment.  Given bag with eye drops/ointment/shield.  Pt ready for discharge.  Taken down via wheelchair to go home.

## 2013-06-06 ENCOUNTER — Encounter (HOSPITAL_COMMUNITY): Payer: Self-pay | Admitting: Ophthalmology

## 2013-06-12 ENCOUNTER — Inpatient Hospital Stay (INDEPENDENT_AMBULATORY_CARE_PROVIDER_SITE_OTHER): Payer: Medicare Other | Admitting: Ophthalmology

## 2013-06-12 DIAGNOSIS — H33009 Unspecified retinal detachment with retinal break, unspecified eye: Secondary | ICD-10-CM

## 2013-06-20 ENCOUNTER — Other Ambulatory Visit: Payer: Self-pay

## 2013-06-20 MED ORDER — ATORVASTATIN CALCIUM 80 MG PO TABS: 80.0000 mg | ORAL_TABLET | Freq: Every day | ORAL | Status: DC

## 2013-06-28 ENCOUNTER — Ambulatory Visit (HOSPITAL_BASED_OUTPATIENT_CLINIC_OR_DEPARTMENT_OTHER): Payer: Medicare Other | Attending: Cardiology

## 2013-07-05 ENCOUNTER — Encounter (INDEPENDENT_AMBULATORY_CARE_PROVIDER_SITE_OTHER): Payer: Medicare Other | Admitting: Ophthalmology

## 2013-07-05 DIAGNOSIS — H33009 Unspecified retinal detachment with retinal break, unspecified eye: Secondary | ICD-10-CM

## 2013-07-14 ENCOUNTER — Ambulatory Visit (HOSPITAL_BASED_OUTPATIENT_CLINIC_OR_DEPARTMENT_OTHER): Payer: Medicare Other | Attending: Cardiology

## 2013-07-14 VITALS — Ht 63.0 in | Wt 165.0 lb

## 2013-07-14 DIAGNOSIS — G4733 Obstructive sleep apnea (adult) (pediatric): Secondary | ICD-10-CM | POA: Insufficient documentation

## 2013-07-21 DIAGNOSIS — G4733 Obstructive sleep apnea (adult) (pediatric): Secondary | ICD-10-CM

## 2013-07-21 NOTE — Sleep Study (Signed)
[facility]  NAME: [name] [name] DATE OF BIRTH:  [birth year] MEDICAL RECORD NUMBER [id number]  LOCATION: [facility]  PHYSICIAN: Barbaraann Share  DATE OF STUDY: 07/14/2013  SLEEP STUDY TYPE: Nocturnal Polysomnogram               REFERRING PHYSICIAN: Laurey Morale, MD  INDICATION FOR STUDY: hypersomnia with sleep apnea  EPWORTH SLEEPINESS SCORE:  7 HEIGHT: 5\' 3"  (160 cm)  WEIGHT: 165 lb (74.844 kg)    Body mass index is 29.24 kg/(m^2).  NECK SIZE: 13 in.  MEDICATIONS:   SLEEP ARCHITECTURE: The patient had a total sleep time of 295 minutes, with a small quantity of slow wave sleep, and did not achieve REM.  Sleep onset latency was normal at 9 minutes, and sleep efficiency was moderately reduced at 73%.  RESPIRATORY DATA: The patient was noted to have 19 apneas and 20 obstructive hypopneas, giving her an AHI of 8 events per hour. The events occurred primarily in the supine position, and there was very loud snoring noted.  OXYGEN DATA: There was oxygen desaturation as low as 87% transiently with the patient's events.  CARDIAC DATA: Occasional PVC noted, but no clinically significant arrhythmias were seen  MOVEMENT/PARASOMNIA: The patient had no significant leg jerks or other abnormal behaviors noted.  IMPRESSION/ RECOMMENDATION:    1) very mild obstructive sleep apnea/hypopnea syndrome, with an AHI of 8 events per hour and oxygen desaturation as low as 87%. The patient did not achieve REM, and therefore her degree of sleep apnea may be underestimated. Treatment for this mild degree of sleep apnea can include a trial of weight loss alone, upper airway surgery, dental appliance, and CPAP. Clinical correlation is suggested.   Barbaraann Share Diplomate, American Board of Sleep Medicine  ELECTRONICALLY SIGNED ON:  07/21/2013, 3:51 PM [facility] PH: [phone]   FX: [id number] ACCREDITED BY THE AMERICAN ACADEMY OF  SLEEP MEDICINE

## 2013-07-22 ENCOUNTER — Other Ambulatory Visit: Payer: Self-pay | Admitting: *Deleted

## 2013-07-22 MED ORDER — ATORVASTATIN CALCIUM 80 MG PO TABS: 80.0000 mg | ORAL_TABLET | Freq: Every day | ORAL | Status: DC

## 2013-08-12 ENCOUNTER — Other Ambulatory Visit: Payer: Self-pay

## 2013-08-12 MED ORDER — ATORVASTATIN CALCIUM 80 MG PO TABS: 80.0000 mg | ORAL_TABLET | Freq: Every day | ORAL | Status: DC

## 2013-09-13 ENCOUNTER — Encounter (INDEPENDENT_AMBULATORY_CARE_PROVIDER_SITE_OTHER): Payer: Medicare Other | Admitting: Ophthalmology

## 2013-09-13 DIAGNOSIS — H43819 Vitreous degeneration, unspecified eye: Secondary | ICD-10-CM

## 2013-09-13 DIAGNOSIS — H33009 Unspecified retinal detachment with retinal break, unspecified eye: Secondary | ICD-10-CM

## 2013-09-13 DIAGNOSIS — E11319 Type 2 diabetes mellitus with unspecified diabetic retinopathy without macular edema: Secondary | ICD-10-CM

## 2013-09-13 DIAGNOSIS — E1165 Type 2 diabetes mellitus with hyperglycemia: Secondary | ICD-10-CM

## 2013-09-13 DIAGNOSIS — I10 Essential (primary) hypertension: Secondary | ICD-10-CM

## 2013-09-13 DIAGNOSIS — H35039 Hypertensive retinopathy, unspecified eye: Secondary | ICD-10-CM

## 2013-09-13 DIAGNOSIS — E1139 Type 2 diabetes mellitus with other diabetic ophthalmic complication: Secondary | ICD-10-CM

## 2013-09-13 DIAGNOSIS — H3530 Unspecified macular degeneration: Secondary | ICD-10-CM

## 2014-06-03 ENCOUNTER — Other Ambulatory Visit (HOSPITAL_COMMUNITY): Payer: Self-pay | Admitting: Orthopedic Surgery

## 2014-06-03 DIAGNOSIS — M8580 Other specified disorders of bone density and structure, unspecified site: Secondary | ICD-10-CM

## 2014-06-09 ENCOUNTER — Ambulatory Visit (HOSPITAL_COMMUNITY)
Admission: RE | Admit: 2014-06-09 | Discharge: 2014-06-09 | Disposition: A | Discharge status: Home / Self Care | Payer: Medicare Other | Source: Ambulatory Visit | Attending: Orthopedic Surgery | Admitting: Orthopedic Surgery

## 2014-06-09 DIAGNOSIS — Z13820 Encounter for screening for osteoporosis: Secondary | ICD-10-CM | POA: Diagnosis not present

## 2014-06-09 DIAGNOSIS — Z780 Asymptomatic menopausal state: Secondary | ICD-10-CM | POA: Diagnosis not present

## 2014-06-09 DIAGNOSIS — M8580 Other specified disorders of bone density and structure, unspecified site: Secondary | ICD-10-CM | POA: Diagnosis not present

## 2014-06-16 ENCOUNTER — Ambulatory Visit (INDEPENDENT_AMBULATORY_CARE_PROVIDER_SITE_OTHER): Payer: Medicare Other | Admitting: Physician Assistant

## 2014-06-16 ENCOUNTER — Encounter: Payer: Self-pay | Admitting: Physician Assistant

## 2014-06-16 VITALS — BP 110/56 | HR 53 | Ht 63.0 in | Wt 131.0 lb

## 2014-06-16 DIAGNOSIS — E785 Hyperlipidemia, unspecified: Secondary | ICD-10-CM

## 2014-06-16 DIAGNOSIS — I2510 Atherosclerotic heart disease of native coronary artery without angina pectoris: Secondary | ICD-10-CM

## 2014-06-16 DIAGNOSIS — I10 Essential (primary) hypertension: Secondary | ICD-10-CM

## 2014-06-16 DIAGNOSIS — I359 Nonrheumatic aortic valve disorder, unspecified: Secondary | ICD-10-CM

## 2014-06-16 DIAGNOSIS — I5032 Chronic diastolic (congestive) heart failure: Secondary | ICD-10-CM

## 2014-06-16 NOTE — Assessment & Plan Note (Signed)
No evidence of heart failure on exam 

## 2014-06-16 NOTE — Patient Instructions (Signed)
Your physician wants you to follow-up in:  12 months with Dr. Aundra Dubin. You will receive a reminder letter in the mail two months in advance. If you don't receive a letter, please call our office to schedule the follow-up appointment.

## 2014-06-16 NOTE — Assessment & Plan Note (Signed)
Managed by primary M.D.

## 2014-06-16 NOTE — Assessment & Plan Note (Signed)
Stable without chest pain. Continue aspirin and metoprolol and isosorbide.

## 2014-06-16 NOTE — Assessment & Plan Note (Signed)
Blood pressure well controlled

## 2014-06-16 NOTE — Assessment & Plan Note (Signed)
Asymptomatic. Mild AI on last 2-D echo

## 2014-06-16 NOTE — Progress Notes (Signed)
[name] [name] is a 80 year old female patient Dr. Aundra Dubin who has history of coronary artery disease status post PCI to the [place] a drug-eluting stent in 2006. Repeat cardiac catheterization for chest pain and 2010 showed a patent proximal LAD stent and a 50% mid LAD stenosis similar to prior cath in 2006. Echo in 2014 EF 60-65% with grade 2 diastolic dysfunction and mild AI.patient also has hypertension, history of breast cancer status post mastectomy in early 2000's.  Patient comes in today for yearly followup. She denies any chest pain, palpitations, dyspnea, dyspnea on exertion, dizziness, or presyncope. She stays very active but does not exercise on a regular basis. She's recently had some left knee trouble and has had several injections. She has lost 43 pounds since April following a decreased calorie diet scribed by her primary M.D. She also had her cholesterol checked recently and it was stable.  No Known Allergies   Current Outpatient Prescriptions  Medication Sig Dispense Refill  . aspirin 81 MG tablet Take 81 mg by mouth daily.      Marland Kitchen atorvastatin (LIPITOR) 80 MG tablet Take 1 tablet (80 mg total) by mouth daily. 90 tablet 3  . calcium carbonate (OS-CAL) 600 MG TABS tablet Take 600 mg by mouth daily with breakfast.    . cholecalciferol (VITAMIN D) 1000 UNITS tablet Take 1,000 Units by mouth daily.    Marland Kitchen ezetimibe-simvastatin (VYTORIN) 10-40 MG per tablet Take 1 tablet by mouth at bedtime.    . hydrochlorothiazide (MICROZIDE) 12.5 MG capsule Take 1 capsule (12.5 mg total) by mouth daily. 90 capsule 3  . isosorbide mononitrate (IMDUR) 120 MG 24 hr tablet Take 120 mg by mouth Daily.     Marland Kitchen levothyroxine (SYNTHROID, LEVOTHROID) 75 MCG tablet Take 75 mcg by mouth daily.     Marland Kitchen losartan (COZAAR) 100 MG tablet Take 1 tablet (100 mg total) by mouth daily. 90 tablet 3  . metoprolol (TOPROL-XL) 50 MG 24 hr tablet Take 50 mg by mouth daily.      . traZODone (DESYREL) 150 MG tablet Take 150  mg by mouth at bedtime.    Marland Kitchen venlafaxine (EFFEXOR) 75 MG tablet Take 75 mg by mouth 2 (two) times daily. 2 in am 1 in pm     No current facility-administered medications for this visit.    Past Medical History  Diagnosis Date  . Hypercholesterolemia   . CAD (coronary artery disease)     The patient had a left heart catheterization in August 2006 showed an EF of 70%, a 70% mid LAD lesion and 80% distal LAD ledsion, as well as 50% ostial first diagonal lesion.  The patient did have a drug-eluting stent placed in her mid LAD at that time.    . Diastolic heart failure     Echo 11/10 with EF 55-60%, moderate diastolic dysfunction, no regional WMAs, mild to moderate TR, moderate LAE, mild AI  . Hypertension   . Hypothyroidism   . Breast cancer     treated with mastectomy/tomoxifen in early 2000s  . Bronchitis     hx of  . Mental disorder   . Depression   . Frequent urination at night   . Heart murmur     "small"  . Anginal pain   . Type II diabetes mellitus     "controlled w/diet and exercise"  . GERD (gastroesophageal reflux disease)     "gone since gallbladder took out"  . Arthritis     "little in  my thumbs"  . Anxiety     Past Surgical History  Procedure Laterality Date  . Adenosine myoview      9/09: EF 69% with normal perfusion images suggesting no ischemia or infarction.    Marland Kitchen Ulnar nerve repair      left arm  . Plantar fascia release  1990's    left  . Colonoscopy    . Pars plana vitrectomy  04/03/2012    right  . Tonsillectomy and adenoidectomy  1972  . Appendectomy  1954  . Cholecystectomy  ~ 2004  . Abdominal hysterectomy  1964  . Dilation and curettage of uterus  1960's    "I had 3"  . Neuroplasty / transposition median nerve at carpal tunnel bilateral  1990's  . Cataract extraction w/ intraocular lens  implant, bilateral  ~ 2000  . Mastectomy  2001    right breast  . Breast biopsy  2001    right  . Cardiac catheterization    . Coronary angioplasty    .  Coronary angioplasty with stent placement  2000's    "1"  . Pars plana vitrectomy  04/03/2012    Procedure: PARS PLANA VITRECTOMY WITH 25 GAUGE;  Surgeon: Hayden Pedro, MD;  Location: [facility];  Service: Ophthalmology;  Laterality: Right;  Silicone Oil, Perfluron, Repair Complex Traction Retinal Detachment  . 25 gauge pars plana vitrectomy with 20 gauge mvr port Right 06/04/2013    Procedure: 25 GAUGE PARS PLANA VITRECTOMY WITH 20 GAUGE MVR PORT / REMOVAL SILICONE OIL RIGHT EYE. With Endolaser;  Surgeon: Hayden Pedro, MD;  Location: [place];  Service: Ophthalmology;  Laterality: Right;  . Membrane peel Right 06/04/2013    Procedure: MEMBRANE PEEL;  Surgeon: Hayden Pedro, MD;  Location: [place];  Service: Ophthalmology;  Laterality: Right;  . Gas/fluid exchange Right 06/04/2013    Procedure: GAS/FLUID EXCHANGE;  Surgeon: Hayden Pedro, MD;  Location: [place];  Service: Ophthalmology;  Laterality: Right;    No family history on file.  History   Social History  . Marital Status: Married    Spouse Name: N/A    Number of Children: N/A  . Years of Education: N/A   Occupational History  . Daycare provider     part-time   Social History Main Topics  . Smoking status: Never Smoker   . Smokeless tobacco: Never Used  . Alcohol Use: No  . Drug Use: No  . Sexual Activity: No   Other Topics Concern  . Not on file   Social History Narrative    ROS:see history of present illness otherwise negative  BP 110/56 mmHg  Pulse 53  Ht 5\' 3"  (1.6 m)  Wt 131 lb (59.421 kg)  BMI 23.21 kg/m2  PHYSICAL EXAM: Well-nournished, in no acute distress. Neck: No JVD, HJR, Bruit, or thyroid enlargement  Lungs: No tachypnea, clear without wheezing, rales, or rhonchi  Cardiovascular: RRR, PMI not displaced, Normal S1 and S2, 2/6 systolic murmur at the left sternal border, no gallops, bruit, thrill, or heave.  Abdomen: BS normal. Soft without organomegaly, masses, lesions or  tenderness.  Extremities: without cyanosis, clubbing or edema. Good distal pulses bilateral  SKin: Warm, no lesions or rashes   Musculoskeletal: No deformities  Neuro: no focal signs   Wt Readings from Last 3 Encounters:  06/16/14 131 lb (59.421 kg)  07/14/13 165 lb (74.844 kg)  06/04/13 171 lb 4.8 oz (77.701 kg)    Lab Results  Component Value Date  WBC 4.6 06/04/2013   HGB 11.8* 06/04/2013   HCT 34.8* 06/04/2013   PLT 209 06/04/2013   GLUCOSE 91 06/04/2013   CHOL 131 05/16/2011   TRIG 74.0 05/16/2011   HDL 66.50 05/16/2011   LDLCALC 50 05/16/2011   ALT 18 05/16/2011   AST 23 05/16/2011   NA 138 06/04/2013   K 3.5 06/04/2013   CL 101 06/04/2013   CREATININE 0.78 06/04/2013   BUN 15 06/04/2013   CO2 28 06/04/2013   INR 1.0 ratio 06/22/2009    EQA:STMHD bradycardia 53 beats per minute, nonspecific ST-T wave changes, no acute change    2-D echo 03/22/13: Study Conclusions  - Left ventricle: The cavity size was normal. Wall thickness   was normal. Systolic function was normal. The estimated   ejection fraction was in the range of 60% to 65%. Wall   motion was normal; there were no regional wall motion   abnormalities. Features are consistent with a pseudonormal   left ventricular filling pattern, with concomitant   abnormal relaxation and increased filling pressure (grade   2 diastolic dysfunction). Doppler parameters are   consistent with high ventricular filling pressure. - Aortic valve: Mild regurgitation. - Mitral valve: Severely calcified annulus. - Left atrium: The atrium was mildly dilated. - Pulmonary arteries: Systolic pressure was mildly   increased. PA peak pressure: 39mm Hg (S).

## 2014-08-01 ENCOUNTER — Other Ambulatory Visit: Payer: Self-pay | Admitting: Cardiology

## 2014-09-22 ENCOUNTER — Ambulatory Visit (INDEPENDENT_AMBULATORY_CARE_PROVIDER_SITE_OTHER): Payer: Medicare Other | Admitting: Ophthalmology

## 2014-10-20 ENCOUNTER — Ambulatory Visit (INDEPENDENT_AMBULATORY_CARE_PROVIDER_SITE_OTHER): Payer: Medicare Other | Admitting: Ophthalmology

## 2014-10-20 DIAGNOSIS — H35033 Hypertensive retinopathy, bilateral: Secondary | ICD-10-CM | POA: Diagnosis not present

## 2014-10-20 DIAGNOSIS — E11329 Type 2 diabetes mellitus with mild nonproliferative diabetic retinopathy without macular edema: Secondary | ICD-10-CM

## 2014-10-20 DIAGNOSIS — E11319 Type 2 diabetes mellitus with unspecified diabetic retinopathy without macular edema: Secondary | ICD-10-CM | POA: Diagnosis not present

## 2014-10-20 DIAGNOSIS — I10 Essential (primary) hypertension: Secondary | ICD-10-CM

## 2014-10-20 DIAGNOSIS — H43813 Vitreous degeneration, bilateral: Secondary | ICD-10-CM

## 2015-06-08 ENCOUNTER — Other Ambulatory Visit: Payer: Self-pay | Admitting: Cardiology

## 2015-06-11 ENCOUNTER — Other Ambulatory Visit: Payer: Self-pay | Admitting: Orthopedic Surgery

## 2015-06-16 HISTORY — PX: BACK SURGERY: SHX140

## 2015-06-25 ENCOUNTER — Encounter (HOSPITAL_COMMUNITY): Payer: Self-pay

## 2015-06-25 ENCOUNTER — Encounter (HOSPITAL_COMMUNITY)
Admission: RE | Admit: 2015-06-25 | Discharge: 2015-06-25 | Disposition: A | Discharge status: Home / Self Care | Payer: Medicare Other | Source: Ambulatory Visit | Attending: Orthopedic Surgery | Admitting: Orthopedic Surgery

## 2015-06-25 DIAGNOSIS — Z0183 Encounter for blood typing: Secondary | ICD-10-CM | POA: Diagnosis not present

## 2015-06-25 DIAGNOSIS — Z7982 Long term (current) use of aspirin: Secondary | ICD-10-CM | POA: Insufficient documentation

## 2015-06-25 DIAGNOSIS — I517 Cardiomegaly: Secondary | ICD-10-CM | POA: Diagnosis not present

## 2015-06-25 DIAGNOSIS — I110 Hypertensive heart disease with heart failure: Secondary | ICD-10-CM | POA: Insufficient documentation

## 2015-06-25 DIAGNOSIS — Z01818 Encounter for other preprocedural examination: Secondary | ICD-10-CM | POA: Diagnosis not present

## 2015-06-25 DIAGNOSIS — E039 Hypothyroidism, unspecified: Secondary | ICD-10-CM | POA: Diagnosis not present

## 2015-06-25 DIAGNOSIS — K219 Gastro-esophageal reflux disease without esophagitis: Secondary | ICD-10-CM | POA: Diagnosis not present

## 2015-06-25 DIAGNOSIS — C50919 Malignant neoplasm of unspecified site of unspecified female breast: Secondary | ICD-10-CM | POA: Diagnosis not present

## 2015-06-25 DIAGNOSIS — E119 Type 2 diabetes mellitus without complications: Secondary | ICD-10-CM | POA: Diagnosis not present

## 2015-06-25 DIAGNOSIS — E785 Hyperlipidemia, unspecified: Secondary | ICD-10-CM | POA: Insufficient documentation

## 2015-06-25 DIAGNOSIS — Z79899 Other long term (current) drug therapy: Secondary | ICD-10-CM | POA: Insufficient documentation

## 2015-06-25 DIAGNOSIS — I2510 Atherosclerotic heart disease of native coronary artery without angina pectoris: Secondary | ICD-10-CM | POA: Diagnosis not present

## 2015-06-25 DIAGNOSIS — Z955 Presence of coronary angioplasty implant and graft: Secondary | ICD-10-CM | POA: Insufficient documentation

## 2015-06-25 DIAGNOSIS — I5030 Unspecified diastolic (congestive) heart failure: Secondary | ICD-10-CM | POA: Insufficient documentation

## 2015-06-25 DIAGNOSIS — Z01812 Encounter for preprocedural laboratory examination: Secondary | ICD-10-CM | POA: Insufficient documentation

## 2015-06-25 LAB — COMPREHENSIVE METABOLIC PANEL
ALT: 17 U/L (ref 14–54)
AST: 26 U/L (ref 15–41)
Albumin: 3.5 g/dL (ref 3.5–5.0)
Alkaline Phosphatase: 77 U/L (ref 38–126)
Anion gap: 8 (ref 5–15)
BUN: 7 mg/dL (ref 6–20)
CO2: 30 mmol/L (ref 22–32)
Calcium: 9.7 mg/dL (ref 8.9–10.3)
Chloride: 100 mmol/L — ABNORMAL LOW (ref 101–111)
Creatinine, Ser: 0.83 mg/dL (ref 0.44–1.00)
GFR calc Af Amer: >60 mL/min (ref >60)
GFR calc non Af Amer: >60 mL/min (ref >60)
Glucose, Bld: 93 mg/dL (ref 65–99)
Potassium: 3.6 mmol/L (ref 3.5–5.1)
Sodium: 138 mmol/L (ref 135–145)
Total Bilirubin: 0.4 mg/dL (ref 0.3–1.2)
Total Protein: 7.2 g/dL (ref 6.5–8.1)

## 2015-06-25 LAB — CBC WITH DIFFERENTIAL/PLATELET
Basophils Absolute: 0 10*3/uL (ref 0.0–0.1)
Basophils Relative: 0 %
Eosinophils Absolute: 0.1 10*3/uL (ref 0.0–0.7)
Eosinophils Relative: 2 %
HCT: 37.8 % (ref 36.0–46.0)
Hemoglobin: 12.6 g/dL (ref 12.0–15.0)
Lymphocytes Relative: 30 %
Lymphs Abs: 1.8 10*3/uL (ref 0.7–4.0)
MCH: 30.3 pg (ref 26.0–34.0)
MCHC: 33.3 g/dL (ref 30.0–36.0)
MCV: 90.9 fL (ref 78.0–100.0)
Monocytes Absolute: 0.3 10*3/uL (ref 0.1–1.0)
Monocytes Relative: 5 %
Neutro Abs: 3.7 10*3/uL (ref 1.7–7.7)
Neutrophils Relative %: 63 %
Platelets: 327 10*3/uL (ref 150–400)
RBC: 4.16 MIL/uL (ref 3.87–5.11)
RDW: 12.8 % (ref 11.5–15.5)
WBC: 5.9 10*3/uL (ref 4.0–10.5)

## 2015-06-25 LAB — URINALYSIS, ROUTINE W REFLEX MICROSCOPIC
Glucose, UA: NEGATIVE mg/dL
Ketones, ur: 15 mg/dL — AB
Leukocytes, UA: NEGATIVE
Nitrite: NEGATIVE
Protein, ur: NEGATIVE mg/dL
Specific Gravity, Urine: 1.022 (ref 1.005–1.030)
Urobilinogen, UA: 1 mg/dL (ref 0.0–1.0)
pH: 5.5 (ref 5.0–8.0)

## 2015-06-25 LAB — SURGICAL PCR SCREEN
MRSA, PCR: NEGATIVE
Staphylococcus aureus: NEGATIVE

## 2015-06-25 LAB — TYPE AND SCREEN
ABO/RH(D): O NEG
Antibody Screen: NEGATIVE

## 2015-06-25 LAB — URINE MICROSCOPIC-ADD ON

## 2015-06-25 LAB — PROTIME-INR
INR: 0.99 (ref 0.00–1.49)
Prothrombin Time: 13.3 seconds (ref 11.6–15.2)

## 2015-06-25 LAB — APTT: aPTT: 28 seconds (ref 24–37)

## 2015-06-25 LAB — ABO/RH: ABO/RH(D): O NEG

## 2015-06-25 NOTE — Pre-Procedure Instructions (Signed)
    [name] [name]  06/25/2015      [facility] [address] [place], [place] - [address] [place] [address] Phone: [phone] Fax: [phone]  [facility] [place], [place] [address] Phone: [phone] Fax: [phone]  [facility] ORDER) [place], [place] [address] Phone: [phone] Fax: [phone]    Your procedure is scheduled on 07/08/15.  Report to [facility] Admitting at 630 A.M.  Call this number if you have problems the morning of surgery:  [phone]   Remember:  Do not eat food or drink liquids after midnight.  Take these medicines the morning of surgery with A SIP OF WATER --imdur,synthroid,metoprolol,effexor   Do not wear jewelry, make-up or nail polish.  Do not wear lotions, powders, or perfumes.  You may wear deodorant.  Do not shave 48 hours prior to surgery.  Men may shave face and neck.  Do not bring valuables to the hospital.  [facility] is not responsible for any belongings or valuables.  Contacts, dentures or bridgework may not be worn into surgery.  Leave your suitcase in the car.  After surgery it may be brought to your room.  For patients admitted to the hospital, discharge time will be determined by your treatment team.  Patients discharged the day of surgery will not be allowed to drive home.   Name and phone number of your driver:    Special instructions:    Please read over the following fact sheets that you were given. Pain Booklet, Coughing and Deep Breathing, Blood Transfusion Information, MRSA Information and Surgical Site Infection Prevention

## 2015-06-26 NOTE — Progress Notes (Addendum)
Anesthesia Chart Review:  Pt is 81 year old female scheduled for L4-5 transforaminal interbody fusion, L3-4 and L4-5 decompression on 07/08/15 with Dr. Lynann Bologna.   Cardiologist is Dr. Loralie Champagne; last cards office visit 06/16/14 with Ermalinda Barrios, PA.  PCP is Lita Mains, NP (care everywhere).   PMH includes:  CAD (DES to LAD 123456), diastolic heart failure, HTN, hyperlipidemia, hypothyroidism, heart murmur, DM, breast cancer, GERD. Never smoker. BMI 30. S/p R pars plana vitrectomy 06/04/13 and 04/03/12.   Medications include: ASA, lipitor, vytorin, hctz, imdur, levothyroxine, losartan, metoprolol.  Preoperative labs reviewed.    Chest x-ray 06/25/15 reviewed. No change in hyperaeration and mild cardiomegaly. No active lung disease.   EKG 06/25/15: NSR.   Echo 03/22/13:  - Left ventricle: The cavity size was normal. Wall thickness was normal. Systolic function was normal. The estimated ejection fraction was in the range of 60% to 65%. Wall motion was normal; there were no regional wall motion abnormalities. Features are consistent with a pseudonormal left ventricular filling pattern, with concomitant abnormal relaxation and increased filling pressure (grade 2 diastolic dysfunction). Doppler parameters are consistent with high ventricular filling pressure. - Aortic valve: Mild regurgitation. - Mitral valve: Severely calcified annulus. - Left atrium: The atrium was mildly dilated. - Pulmonary arteries: Systolic pressure was mildly increased. PA peak pressure: 30mm Hg (S).  Cardiac cath 06/26/09:  - LAD stent is patent with minimal in-stent restenosis - 50% mid LAD stenosis of the site of the takeoff of the 3rd diagonal - Stable compared with prior cath in 2006  Pt has medical clearance from Elisha Ponder, FNP at her PCP's office.   Reviewed case with Dr. Linna Caprice. Pt will need cardiac clearance prior to surgery. Notified Carla in Dr. Laurena Bering office.   Willeen Cass, FNP-BC [facility]  Short Stay Surgical Center/Anesthesiology Phone: [phone] 06/29/2015 2:24 PM  Addendum:  Dr. Claris Gladden office called and spoke with pt. No new CV symptoms. Pt is clear for surgery per Dr. Claris Gladden Epic telephone encounter dated 07/01/15.   Willeen Cass, FNP-BC [facility] Short Stay Surgical Center/Anesthesiology Phone: [phone] 07/02/2015 1:45 PM

## 2015-07-01 ENCOUNTER — Telehealth: Payer: Self-pay | Admitting: Cardiology

## 2015-07-01 NOTE — Telephone Encounter (Signed)
She should be ok for surgery as long as she is not having any new cardiac symptoms (no chest pain or new dyspnea). Would talk to her to make sure that she is stable.

## 2015-07-01 NOTE — Telephone Encounter (Signed)
New message    Office fax surgical clearance form on yesterday    Request for surgical clearance:  1. What type of surgery is being performed?  Lumbar fusion    2. When is this surgery scheduled? 11.23.2016   3. Are there any medications that need to be held prior to surgery and how long? Stop about a week before surgery    4. Name of physician performing surgery? Dr Dereck Leep   5. What is your office phone and fax number? [phone] /  fax [phone]

## 2015-07-01 NOTE — Telephone Encounter (Signed)
Returned call.  Ortho calling to see if clearance form received and what the status is.  Advised that the Dr isn't in the office today but will back tomorrow and I will forward msg to Dr and nurse to address.

## 2015-07-01 NOTE — Telephone Encounter (Signed)
Called patient to confirm that she isn't having any cardiac symptoms, prior to granting surgical clearance.  LMTCB 07-01-15

## 2015-07-02 ENCOUNTER — Ambulatory Visit: Payer: Medicare Other | Admitting: Physician Assistant

## 2015-07-02 NOTE — Telephone Encounter (Signed)
Information forwarded to Dr Lynann Bologna not Dr Dereck Leep at [facility].

## 2015-07-02 NOTE — Telephone Encounter (Signed)
I will forward this information to Dr Dereck Leep.

## 2015-07-02 NOTE — Telephone Encounter (Signed)
Pt states she has not had any new cardiac symptoms since last office visit here.  Pt states she has not had any cardiac symptoms since last office visit here.

## 2015-07-07 MED ORDER — CEFAZOLIN SODIUM-DEXTROSE 2-3 GM-% IV SOLR
2.0000 g | INTRAVENOUS | Status: AC
  Administered 2015-07-08: 2 g via INTRAVENOUS
  Filled 2015-07-07: qty 50

## 2015-07-07 MED ORDER — POVIDONE-IODINE 7.5 % EX SOLN
Freq: Once | CUTANEOUS | Status: DC
  Filled 2015-07-07: qty 118

## 2015-07-07 NOTE — H&P (Signed)
PREOPERATIVE H&P  Chief Complaint: Left leg pain  HPI: [name] [name] is a 81 y.o. female who presents with ongoing pain in the left leg x many years  MRI reveals stenosis L3-5 and radiographs reveal a spondylolisthesis L4/5  Patient has failed multiple forms of conservative care and continues to have pain (see office notes for additional details regarding the patient's full course of treatment)  Past Medical History  Diagnosis Date  . Hypercholesterolemia   . CAD (coronary artery disease)     The patient had a left heart catheterization in August 2006 showed an EF of 70%, a 70% mid LAD lesion and 80% distal LAD ledsion, as well as 50% ostial first diagonal lesion.  The patient did have a drug-eluting stent placed in her mid LAD at that time.    . Diastolic heart failure     Echo 11/10 with EF 55-60%, moderate diastolic dysfunction, no regional WMAs, mild to moderate TR, moderate LAE, mild AI  . Hypertension   . Hypothyroidism   . Breast cancer ([place])     treated with mastectomy/tomoxifen in early 2000s  . Bronchitis     hx of  . Mental disorder   . Depression   . Frequent urination at night   . Heart murmur     "small"  . Anginal pain ([place])   . Type II diabetes mellitus ([place])     "controlled w/diet and exercise"  . GERD (gastroesophageal reflux disease)     "gone since gallbladder took out"  . Arthritis     "little in my thumbs"  . Anxiety    Past Surgical History  Procedure Laterality Date  . Adenosine myoview      9/09: EF 69% with normal perfusion images suggesting no ischemia or infarction.    Marland Kitchen Ulnar nerve repair      left arm  . Plantar fascia release  1990's    left  . Colonoscopy    . Pars plana vitrectomy  04/03/2012    right  . Tonsillectomy and adenoidectomy  1972  . Appendectomy  1954  . Cholecystectomy  ~ 2004  . Abdominal hysterectomy  1964  . Dilation and curettage of uterus  1960's    "I had 3"  . Neuroplasty / transposition median nerve at  carpal tunnel bilateral  1990's  . Cataract extraction w/ intraocular lens  implant, bilateral  ~ 2000  . Mastectomy  2001    right breast  . Breast biopsy  2001    right  . Cardiac catheterization    . Coronary angioplasty    . Coronary angioplasty with stent placement  2000's    "1"  . Pars plana vitrectomy  04/03/2012    Procedure: PARS PLANA VITRECTOMY WITH 25 GAUGE;  Surgeon: Hayden Pedro, MD;  Location: [place];  Service: Ophthalmology;  Laterality: Right;  Silicone Oil, Perfluron, Repair Complex Traction Retinal Detachment  . 25 gauge pars plana vitrectomy with 20 gauge mvr port Right 06/04/2013    Procedure: 25 GAUGE PARS PLANA VITRECTOMY WITH 20 GAUGE MVR PORT / REMOVAL SILICONE OIL RIGHT EYE. With Endolaser;  Surgeon: Hayden Pedro, MD;  Location: [place];  Service: Ophthalmology;  Laterality: Right;  . Membrane peel Right 06/04/2013    Procedure: MEMBRANE PEEL;  Surgeon: Hayden Pedro, MD;  Location: [place];  Service: Ophthalmology;  Laterality: Right;  . Gas/fluid exchange Right 06/04/2013    Procedure: GAS/FLUID EXCHANGE;  Surgeon: Hayden Pedro,  MD;  Location: [place];  Service: Ophthalmology;  Laterality: Right;   Social History   Social History  . Marital Status: Married    Spouse Name: N/A  . Number of Children: N/A  . Years of Education: N/A   Occupational History  . Daycare provider     part-time   Social History Main Topics  . Smoking status: Never Smoker   . Smokeless tobacco: Never Used  . Alcohol Use: No  . Drug Use: No  . Sexual Activity: No   Other Topics Concern  . Not on file   Social History Narrative   No family history on file. No Known Allergies Prior to Admission medications   Medication Sig Start Date End Date Taking? Authorizing Provider  aspirin 81 MG tablet Take 81 mg by mouth daily.     Yes Historical Provider, MD  atorvastatin (LIPITOR) 80 MG tablet Take 1 tablet (80 mg total) by mouth daily. 06/09/15  Yes Larey Dresser, MD    calcium carbonate (OS-CAL) 600 MG TABS tablet Take 600 mg by mouth daily with breakfast.   Yes Historical Provider, MD  ezetimibe-simvastatin (VYTORIN) 10-40 MG per tablet Take 1 tablet by mouth at bedtime.   Yes Historical Provider, MD  hydrochlorothiazide (MICROZIDE) 12.5 MG capsule Take 1 capsule (12.5 mg total) by mouth daily. 07/01/11  Yes Larey Dresser, MD  isosorbide mononitrate (IMDUR) 120 MG 24 hr tablet Take 120 mg by mouth Daily.  04/19/11  Yes Historical Provider, MD  levothyroxine (SYNTHROID, LEVOTHROID) 75 MCG tablet Take 75 mcg by mouth daily.    Yes Historical Provider, MD  losartan (COZAAR) 100 MG tablet Take 1 tablet (100 mg total) by mouth daily. 07/01/11  Yes Larey Dresser, MD  metoprolol (TOPROL-XL) 50 MG 24 hr tablet Take 50 mg by mouth daily.     Yes Historical Provider, MD  traZODone (DESYREL) 150 MG tablet Take 150 mg by mouth at bedtime.   Yes Historical Provider, MD  venlafaxine XR (EFFEXOR-XR) 150 MG 24 hr capsule Take 150 mg by mouth 2 (two) times daily. 06/08/15  Yes Historical Provider, MD     All other systems have been reviewed and were otherwise negative with the exception of those mentioned in the HPI and as above.  Physical Exam: There were no vitals filed for this visit.  General: Alert, no acute distress Cardiovascular: No pedal edema Respiratory: No cyanosis, no use of accessory musculature Skin: No lesions in the area of chief complaint Neurologic: Sensation intact distally Psychiatric: Patient is competent for consent with normal mood and affect Lymphatic: No axillary or cervical lymphadenopathy  MUSCULOSKELETAL: 4/5 strength to EHL on the left  Assessment/Plan: Left leg pain Plan for Procedure(s): POSTERIOR LUMBAR FUSION 1 LEVEL, L3-5 DECOMPRESSION   Sinclair Ship, MD 07/07/2015 6:20 AM

## 2015-07-08 ENCOUNTER — Inpatient Hospital Stay (HOSPITAL_COMMUNITY): Payer: Medicare Other

## 2015-07-08 ENCOUNTER — Inpatient Hospital Stay (HOSPITAL_COMMUNITY): Payer: Medicare Other | Admitting: Emergency Medicine

## 2015-07-08 ENCOUNTER — Inpatient Hospital Stay (HOSPITAL_COMMUNITY): Payer: Medicare Other | Admitting: Certified Registered Nurse Anesthetist

## 2015-07-08 ENCOUNTER — Inpatient Hospital Stay (HOSPITAL_COMMUNITY)
Admission: RE | Admit: 2015-07-08 | Discharge: 2015-07-10 | DRG: 460 | Disposition: A | Discharge status: Home / Self Care | Payer: Medicare Other | Source: Ambulatory Visit | Attending: Orthopedic Surgery | Admitting: Orthopedic Surgery

## 2015-07-08 ENCOUNTER — Encounter (HOSPITAL_COMMUNITY)
Admission: RE | Disposition: A | Discharge status: Home / Self Care | Payer: Medicare Other | Source: Ambulatory Visit | Attending: Orthopedic Surgery

## 2015-07-08 ENCOUNTER — Encounter (HOSPITAL_COMMUNITY): Payer: Self-pay | Admitting: Certified Registered Nurse Anesthetist

## 2015-07-08 DIAGNOSIS — M5410 Radiculopathy, site unspecified: Secondary | ICD-10-CM | POA: Diagnosis present

## 2015-07-08 DIAGNOSIS — I5032 Chronic diastolic (congestive) heart failure: Secondary | ICD-10-CM | POA: Diagnosis present

## 2015-07-08 DIAGNOSIS — Z853 Personal history of malignant neoplasm of breast: Secondary | ICD-10-CM

## 2015-07-08 DIAGNOSIS — E039 Hypothyroidism, unspecified: Secondary | ICD-10-CM | POA: Diagnosis present

## 2015-07-08 DIAGNOSIS — M5416 Radiculopathy, lumbar region: Secondary | ICD-10-CM | POA: Diagnosis present

## 2015-07-08 DIAGNOSIS — Z955 Presence of coronary angioplasty implant and graft: Secondary | ICD-10-CM | POA: Diagnosis not present

## 2015-07-08 DIAGNOSIS — I2510 Atherosclerotic heart disease of native coronary artery without angina pectoris: Secondary | ICD-10-CM | POA: Diagnosis present

## 2015-07-08 DIAGNOSIS — E7800 Pure hypercholesterolemia, unspecified: Secondary | ICD-10-CM | POA: Diagnosis present

## 2015-07-08 DIAGNOSIS — M4806 Spinal stenosis, lumbar region: Secondary | ICD-10-CM | POA: Diagnosis present

## 2015-07-08 DIAGNOSIS — F329 Major depressive disorder, single episode, unspecified: Secondary | ICD-10-CM | POA: Diagnosis present

## 2015-07-08 DIAGNOSIS — M4316 Spondylolisthesis, lumbar region: Secondary | ICD-10-CM | POA: Diagnosis present

## 2015-07-08 DIAGNOSIS — E119 Type 2 diabetes mellitus without complications: Secondary | ICD-10-CM | POA: Diagnosis present

## 2015-07-08 DIAGNOSIS — I10 Essential (primary) hypertension: Secondary | ICD-10-CM | POA: Diagnosis present

## 2015-07-08 DIAGNOSIS — K219 Gastro-esophageal reflux disease without esophagitis: Secondary | ICD-10-CM | POA: Diagnosis present

## 2015-07-08 DIAGNOSIS — Z419 Encounter for procedure for purposes other than remedying health state, unspecified: Secondary | ICD-10-CM

## 2015-07-08 DIAGNOSIS — F419 Anxiety disorder, unspecified: Secondary | ICD-10-CM | POA: Diagnosis present

## 2015-07-08 LAB — GLUCOSE, CAPILLARY
Glucose-Capillary: 98 mg/dL (ref 65–99)
Glucose-Capillary: 99 mg/dL (ref 65–99)
Glucose-Capillary: 105 mg/dL — ABNORMAL HIGH (ref 65–99)
Glucose-Capillary: 133 mg/dL — ABNORMAL HIGH (ref 65–99)

## 2015-07-08 SURGERY — POSTERIOR LUMBAR FUSION 1 LEVEL
Anesthesia: General | Site: Back | Laterality: Left

## 2015-07-08 MED ORDER — HYDROCHLOROTHIAZIDE 12.5 MG PO CAPS
12.5000 mg | ORAL_CAPSULE | Freq: Every day | ORAL | Status: DC
  Administered 2015-07-09 – 2015-07-10 (×2): 12.5 mg via ORAL
  Filled 2015-07-08 (×2): qty 1

## 2015-07-08 MED ORDER — HYDROCODONE-ACETAMINOPHEN 5-325 MG PO TABS
1.0000 | ORAL_TABLET | ORAL | Status: DC | PRN
  Administered 2015-07-09 – 2015-07-10 (×4): 2 via ORAL
  Filled 2015-07-08 (×4): qty 2

## 2015-07-08 MED ORDER — MIDAZOLAM HCL 2 MG/2ML IJ SOLN
INTRAMUSCULAR | Status: AC
  Filled 2015-07-08: qty 2

## 2015-07-08 MED ORDER — CEFAZOLIN SODIUM 1-5 GM-% IV SOLN
1.0000 g | Freq: Three times a day (TID) | INTRAVENOUS | Status: AC
Start: 2015-07-08 — End: 2015-07-09
  Administered 2015-07-08 – 2015-07-09 (×2): 1 g via INTRAVENOUS
  Filled 2015-07-08 (×2): qty 50

## 2015-07-08 MED ORDER — 0.9 % SODIUM CHLORIDE (POUR BTL) OPTIME
TOPICAL | Status: DC | PRN
  Administered 2015-07-08: 1000 mL

## 2015-07-08 MED ORDER — MORPHINE SULFATE (PF) 2 MG/ML IV SOLN: 1.0000 mg | INTRAVENOUS | Status: DC | PRN

## 2015-07-08 MED ORDER — SODIUM CHLORIDE 0.9 % IJ SOLN: 3.0000 mL | INTRAMUSCULAR | Status: DC | PRN

## 2015-07-08 MED ORDER — HYDROMORPHONE HCL 1 MG/ML IJ SOLN
0.2500 mg | INTRAMUSCULAR | Status: DC | PRN
  Administered 2015-07-08 (×2): 0.5 mg via INTRAVENOUS

## 2015-07-08 MED ORDER — LIDOCAINE HCL (CARDIAC) 20 MG/ML IV SOLN
INTRAVENOUS | Status: DC | PRN
  Administered 2015-07-08: 100 mg via INTRAVENOUS

## 2015-07-08 MED ORDER — SUFENTANIL CITRATE 50 MCG/ML IV SOLN
50.0000 ug | INTRAVENOUS | Status: DC | PRN
  Administered 2015-07-08: .2 ug/kg/h via INTRAVENOUS

## 2015-07-08 MED ORDER — ACETAMINOPHEN 325 MG PO TABS: 650.0000 mg | ORAL_TABLET | ORAL | Status: DC | PRN

## 2015-07-08 MED ORDER — SUCCINYLCHOLINE CHLORIDE 20 MG/ML IJ SOLN
INTRAMUSCULAR | Status: DC | PRN
  Administered 2015-07-08: 100 mg via INTRAVENOUS

## 2015-07-08 MED ORDER — OXYCODONE HCL 5 MG PO TABS
5.0000 mg | ORAL_TABLET | Freq: Once | ORAL | Status: AC | PRN
  Administered 2015-07-08: 5 mg via ORAL

## 2015-07-08 MED ORDER — BUPIVACAINE-EPINEPHRINE (PF) 0.25% -1:200000 IJ SOLN
INTRAMUSCULAR | Status: AC
  Filled 2015-07-08: qty 30

## 2015-07-08 MED ORDER — ISOSORBIDE MONONITRATE ER 60 MG PO TB24
120.0000 mg | ORAL_TABLET | Freq: Every day | ORAL | Status: DC
  Filled 2015-07-08 (×2): qty 2

## 2015-07-08 MED ORDER — GLYCOPYRROLATE 0.2 MG/ML IJ SOLN
INTRAMUSCULAR | Status: DC | PRN
  Administered 2015-07-08: 0.2 mg via INTRAVENOUS
  Administered 2015-07-08: 0.4 mg via INTRAVENOUS

## 2015-07-08 MED ORDER — ONDANSETRON HCL 4 MG/2ML IJ SOLN: 4.0000 mg | INTRAMUSCULAR | Status: DC | PRN

## 2015-07-08 MED ORDER — PROPOFOL 500 MG/50ML IV EMUL
INTRAVENOUS | Status: DC | PRN
  Administered 2015-07-08: 50 ug/kg/min via INTRAVENOUS

## 2015-07-08 MED ORDER — BUPIVACAINE-EPINEPHRINE 0.25% -1:200000 IJ SOLN
INTRAMUSCULAR | Status: DC | PRN
  Administered 2015-07-08: 24 mL

## 2015-07-08 MED ORDER — OXYCODONE HCL 5 MG/5ML PO SOLN: 5.0000 mg | Freq: Once | ORAL | Status: AC | PRN

## 2015-07-08 MED ORDER — TRAZODONE HCL 50 MG PO TABS
150.0000 mg | ORAL_TABLET | Freq: Every day | ORAL | Status: DC
  Administered 2015-07-08 – 2015-07-09 (×2): 150 mg via ORAL
  Filled 2015-07-08 (×3): qty 1

## 2015-07-08 MED ORDER — PHENOL 1.4 % MT LIQD: 1.0000 | OROMUCOSAL | Status: DC | PRN

## 2015-07-08 MED ORDER — DEXAMETHASONE SODIUM PHOSPHATE 4 MG/ML IJ SOLN
INTRAMUSCULAR | Status: AC
  Filled 2015-07-08: qty 1

## 2015-07-08 MED ORDER — VENLAFAXINE HCL ER 150 MG PO CP24
150.0000 mg | ORAL_CAPSULE | Freq: Two times a day (BID) | ORAL | Status: DC
  Administered 2015-07-08 – 2015-07-09 (×3): 150 mg via ORAL
  Filled 2015-07-08 (×5): qty 1

## 2015-07-08 MED ORDER — LOSARTAN POTASSIUM 50 MG PO TABS
100.0000 mg | ORAL_TABLET | Freq: Every day | ORAL | Status: DC
  Administered 2015-07-08 – 2015-07-10 (×3): 100 mg via ORAL
  Filled 2015-07-08 (×3): qty 2

## 2015-07-08 MED ORDER — OXYCODONE-ACETAMINOPHEN 5-325 MG PO TABS
1.0000 | ORAL_TABLET | ORAL | Status: DC | PRN
  Administered 2015-07-08 – 2015-07-10 (×7): 2 via ORAL
  Filled 2015-07-08 (×7): qty 2

## 2015-07-08 MED ORDER — ACETAMINOPHEN 650 MG RE SUPP: 650.0000 mg | RECTAL | Status: DC | PRN

## 2015-07-08 MED ORDER — ROCURONIUM BROMIDE 50 MG/5ML IV SOLN
INTRAVENOUS | Status: AC
  Filled 2015-07-08: qty 1

## 2015-07-08 MED ORDER — DOCUSATE SODIUM 100 MG PO CAPS
100.0000 mg | ORAL_CAPSULE | Freq: Two times a day (BID) | ORAL | Status: DC
  Administered 2015-07-08 – 2015-07-10 (×5): 100 mg via ORAL
  Filled 2015-07-08 (×4): qty 1

## 2015-07-08 MED ORDER — SENNOSIDES-DOCUSATE SODIUM 8.6-50 MG PO TABS: 1.0000 | ORAL_TABLET | Freq: Every evening | ORAL | Status: DC | PRN

## 2015-07-08 MED ORDER — OXYCODONE HCL 5 MG PO TABS
ORAL_TABLET | ORAL | Status: AC
  Administered 2015-07-08: 5 mg via ORAL
  Filled 2015-07-08: qty 1

## 2015-07-08 MED ORDER — METHYLENE BLUE 1 % INJ SOLN
INTRAMUSCULAR | Status: AC
  Filled 2015-07-08: qty 10

## 2015-07-08 MED ORDER — SODIUM CHLORIDE 0.9 % IV SOLN: INTRAVENOUS | Status: DC

## 2015-07-08 MED ORDER — PHENYLEPHRINE 40 MCG/ML (10ML) SYRINGE FOR IV PUSH (FOR BLOOD PRESSURE SUPPORT)
PREFILLED_SYRINGE | INTRAVENOUS | Status: AC
  Filled 2015-07-08: qty 10

## 2015-07-08 MED ORDER — SUCCINYLCHOLINE CHLORIDE 20 MG/ML IJ SOLN
INTRAMUSCULAR | Status: AC
  Filled 2015-07-08: qty 1

## 2015-07-08 MED ORDER — ONDANSETRON HCL 4 MG/2ML IJ SOLN
INTRAMUSCULAR | Status: AC
  Filled 2015-07-08: qty 2

## 2015-07-08 MED ORDER — NEOSTIGMINE METHYLSULFATE 10 MG/10ML IV SOLN
INTRAVENOUS | Status: DC | PRN
  Administered 2015-07-08: 3 mg via INTRAVENOUS

## 2015-07-08 MED ORDER — MENTHOL 3 MG MT LOZG: 1.0000 | LOZENGE | OROMUCOSAL | Status: DC | PRN

## 2015-07-08 MED ORDER — LACTATED RINGERS IV SOLN
INTRAVENOUS | Status: DC | PRN
  Administered 2015-07-08 (×3): via INTRAVENOUS

## 2015-07-08 MED ORDER — PROPOFOL 10 MG/ML IV BOLUS
INTRAVENOUS | Status: AC
  Filled 2015-07-08: qty 20

## 2015-07-08 MED ORDER — THROMBIN 20000 UNITS EX SOLR
CUTANEOUS | Status: AC
  Filled 2015-07-08: qty 20000

## 2015-07-08 MED ORDER — ALUM & MAG HYDROXIDE-SIMETH 200-200-20 MG/5ML PO SUSP: 30.0000 mL | Freq: Four times a day (QID) | ORAL | Status: DC | PRN

## 2015-07-08 MED ORDER — ARTIFICIAL TEARS OP OINT
TOPICAL_OINTMENT | OPHTHALMIC | Status: DC | PRN
  Administered 2015-07-08: 1 via OPHTHALMIC

## 2015-07-08 MED ORDER — ZOLPIDEM TARTRATE 5 MG PO TABS
5.0000 mg | ORAL_TABLET | Freq: Every evening | ORAL | Status: DC | PRN
Start: 2015-07-08 — End: 2015-07-10

## 2015-07-08 MED ORDER — MINERAL OIL LIGHT 100 % EX OIL
TOPICAL_OIL | CUTANEOUS | Status: DC | PRN
  Administered 2015-07-08: 1 via TOPICAL

## 2015-07-08 MED ORDER — METHYLENE BLUE 1 % INJ SOLN
INTRAMUSCULAR | Status: DC | PRN
  Administered 2015-07-08: .1 mL via INTRADERMAL

## 2015-07-08 MED ORDER — EPHEDRINE SULFATE 50 MG/ML IJ SOLN
INTRAMUSCULAR | Status: DC | PRN
  Administered 2015-07-08 (×2): 5 mg via INTRAVENOUS

## 2015-07-08 MED ORDER — ATORVASTATIN CALCIUM 80 MG PO TABS
80.0000 mg | ORAL_TABLET | Freq: Every day | ORAL | Status: DC
  Administered 2015-07-08 – 2015-07-09 (×2): 80 mg via ORAL
  Filled 2015-07-08 (×2): qty 1

## 2015-07-08 MED ORDER — DIAZEPAM 5 MG PO TABS
ORAL_TABLET | ORAL | Status: AC
  Administered 2015-07-08: 5 mg via ORAL
  Filled 2015-07-08: qty 1

## 2015-07-08 MED ORDER — LIDOCAINE HCL (CARDIAC) 20 MG/ML IV SOLN
INTRAVENOUS | Status: AC
  Filled 2015-07-08: qty 5

## 2015-07-08 MED ORDER — BISACODYL 5 MG PO TBEC: 5.0000 mg | DELAYED_RELEASE_TABLET | Freq: Every day | ORAL | Status: DC | PRN

## 2015-07-08 MED ORDER — DEXAMETHASONE SODIUM PHOSPHATE 4 MG/ML IJ SOLN
INTRAMUSCULAR | Status: DC | PRN
  Administered 2015-07-08: 4 mg via INTRAVENOUS

## 2015-07-08 MED ORDER — ROCURONIUM BROMIDE 100 MG/10ML IV SOLN
INTRAVENOUS | Status: DC | PRN
  Administered 2015-07-08: 30 mg via INTRAVENOUS
  Administered 2015-07-08 (×2): 10 mg via INTRAVENOUS

## 2015-07-08 MED ORDER — METOPROLOL SUCCINATE ER 50 MG PO TB24
50.0000 mg | ORAL_TABLET | Freq: Every day | ORAL | Status: DC
  Administered 2015-07-10 (×2): 50 mg via ORAL
  Filled 2015-07-08 (×2): qty 1

## 2015-07-08 MED ORDER — ONDANSETRON HCL 4 MG/2ML IJ SOLN
INTRAMUSCULAR | Status: DC | PRN
  Administered 2015-07-08: 4 mg via INTRAVENOUS

## 2015-07-08 MED ORDER — LEVOTHYROXINE SODIUM 75 MCG PO TABS
75.0000 ug | ORAL_TABLET | Freq: Every day | ORAL | Status: DC
  Administered 2015-07-09 – 2015-07-10 (×2): 75 ug via ORAL
  Filled 2015-07-08 (×3): qty 1

## 2015-07-08 MED ORDER — EPHEDRINE SULFATE 50 MG/ML IJ SOLN
INTRAMUSCULAR | Status: AC
  Filled 2015-07-08: qty 1

## 2015-07-08 MED ORDER — SUFENTANIL CITRATE 50 MCG/ML IV SOLN
INTRAVENOUS | Status: AC
  Filled 2015-07-08: qty 2

## 2015-07-08 MED ORDER — FENTANYL CITRATE (PF) 250 MCG/5ML IJ SOLN
INTRAMUSCULAR | Status: AC
  Filled 2015-07-08: qty 5

## 2015-07-08 MED ORDER — PROPOFOL 10 MG/ML IV BOLUS
INTRAVENOUS | Status: DC | PRN
  Administered 2015-07-08: 50 mg via INTRAVENOUS
  Administered 2015-07-08: 100 mg via INTRAVENOUS

## 2015-07-08 MED ORDER — MIDAZOLAM HCL 5 MG/5ML IJ SOLN
INTRAMUSCULAR | Status: DC | PRN
  Administered 2015-07-08: 1 mg via INTRAVENOUS

## 2015-07-08 MED ORDER — EZETIMIBE-SIMVASTATIN 10-40 MG PO TABS: 1.0000 | ORAL_TABLET | Freq: Every day | ORAL | Status: DC

## 2015-07-08 MED ORDER — SODIUM CHLORIDE 0.9 % IJ SOLN
3.0000 mL | Freq: Two times a day (BID) | INTRAMUSCULAR | Status: DC
  Administered 2015-07-08 – 2015-07-09 (×4): 3 mL via INTRAVENOUS

## 2015-07-08 MED ORDER — ACETAMINOPHEN 160 MG/5ML PO SOLN
325.0000 mg | ORAL | Status: DC | PRN
  Filled 2015-07-08: qty 20.3

## 2015-07-08 MED ORDER — ACETAMINOPHEN 325 MG PO TABS: 325.0000 mg | ORAL_TABLET | ORAL | Status: DC | PRN

## 2015-07-08 MED ORDER — MINERAL OIL LIGHT 100 % EX OIL
TOPICAL_OIL | CUTANEOUS | Status: AC
  Filled 2015-07-08: qty 25

## 2015-07-08 MED ORDER — FENTANYL CITRATE (PF) 100 MCG/2ML IJ SOLN
INTRAMUSCULAR | Status: DC | PRN
  Administered 2015-07-08: 50 ug via INTRAVENOUS
  Administered 2015-07-08: 100 ug via INTRAVENOUS
  Administered 2015-07-08 (×2): 50 ug via INTRAVENOUS

## 2015-07-08 MED ORDER — FLEET ENEMA 7-19 GM/118ML RE ENEM: 1.0000 | ENEMA | Freq: Once | RECTAL | Status: DC | PRN

## 2015-07-08 MED ORDER — HYDROMORPHONE HCL 1 MG/ML IJ SOLN
INTRAMUSCULAR | Status: AC
  Administered 2015-07-08: 0.5 mg via INTRAVENOUS
  Filled 2015-07-08: qty 1

## 2015-07-08 MED ORDER — DIAZEPAM 5 MG PO TABS
5.0000 mg | ORAL_TABLET | Freq: Four times a day (QID) | ORAL | Status: DC | PRN
  Administered 2015-07-08 – 2015-07-10 (×4): 5 mg via ORAL
  Filled 2015-07-08 (×4): qty 1

## 2015-07-08 SURGICAL SUPPLY — 88 items
BENZOIN TINCTURE PRP APPL 2/3 (GAUZE/BANDAGES/DRESSINGS) ×3 IMPLANT
BLADE SURG ROTATE 9660 (MISCELLANEOUS) IMPLANT
BUR PRESCISION 1.7 ELITE (BURR) ×3 IMPLANT
BUR ROUND PRECISION 4.0 (BURR) ×2 IMPLANT
BUR ROUND PRECISION 4.0MM (BURR) ×1
BUR SABER RD CUTTING 3.0 (BURR) IMPLANT
BUR SABER RD CUTTING 3.0MM (BURR)
CAGE CONCORDE BULLET 9X11X27 (Cage) ×2 IMPLANT
CAGE SPNL PRLL BLT NOSE 27X9 (Cage) ×1 IMPLANT
CARTRIDGE OIL MAESTRO DRILL (MISCELLANEOUS) ×1 IMPLANT
CLOSURE STERI-STRIP 1/2X4 (GAUZE/BANDAGES/DRESSINGS) ×1
CLOSURE WOUND 1/2 X4 (GAUZE/BANDAGES/DRESSINGS) ×2
CLSR STERI-STRIP ANTIMIC 1/2X4 (GAUZE/BANDAGES/DRESSINGS) ×2 IMPLANT
CONT SPEC STER OR (MISCELLANEOUS) ×3 IMPLANT
COVER MAYO STAND STRL (DRAPES) ×6 IMPLANT
COVER SURGICAL LIGHT HANDLE (MISCELLANEOUS) ×3 IMPLANT
DIFFUSER DRILL AIR PNEUMATIC (MISCELLANEOUS) ×3 IMPLANT
DRAIN CHANNEL 15F RND FF W/TCR (WOUND CARE) IMPLANT
DRAPE C-ARM 42X72 X-RAY (DRAPES) ×3 IMPLANT
DRAPE C-ARMOR (DRAPES) IMPLANT
DRAPE POUCH INSTRU U-SHP 10X18 (DRAPES) ×3 IMPLANT
DRAPE SURG 17X23 STRL (DRAPES) ×12 IMPLANT
DURAPREP 26ML APPLICATOR (WOUND CARE) ×3 IMPLANT
ELECT BLADE 4.0 EZ CLEAN MEGAD (MISCELLANEOUS) ×3
ELECT CAUTERY BLADE 6.4 (BLADE) ×3 IMPLANT
ELECT REM PT RETURN 9FT ADLT (ELECTROSURGICAL) ×3
ELECTRODE BLDE 4.0 EZ CLN MEGD (MISCELLANEOUS) ×1 IMPLANT
ELECTRODE REM PT RTRN 9FT ADLT (ELECTROSURGICAL) ×1 IMPLANT
EVACUATOR SILICONE 100CC (DRAIN) ×3 IMPLANT
FEE INTRAOP MONITOR IMPULS NCS (MISCELLANEOUS) ×1 IMPLANT
GAUZE SPONGE 4X4 12PLY STRL (GAUZE/BANDAGES/DRESSINGS) ×3 IMPLANT
GAUZE SPONGE 4X4 16PLY XRAY LF (GAUZE/BANDAGES/DRESSINGS) ×12 IMPLANT
GLOVE BIO SURGEON STRL SZ7 (GLOVE) ×3 IMPLANT
GLOVE BIO SURGEON STRL SZ8 (GLOVE) ×3 IMPLANT
GLOVE BIOGEL PI IND STRL 7.0 (GLOVE) ×1 IMPLANT
GLOVE BIOGEL PI IND STRL 8 (GLOVE) ×1 IMPLANT
GLOVE BIOGEL PI INDICATOR 7.0 (GLOVE) ×2
GLOVE BIOGEL PI INDICATOR 8 (GLOVE) ×2
GOWN STRL REUS W/ TWL LRG LVL3 (GOWN DISPOSABLE) ×2 IMPLANT
GOWN STRL REUS W/ TWL XL LVL3 (GOWN DISPOSABLE) ×1 IMPLANT
GOWN STRL REUS W/TWL LRG LVL3 (GOWN DISPOSABLE) ×4
GOWN STRL REUS W/TWL XL LVL3 (GOWN DISPOSABLE) ×2
INTRAOP MONITOR FEE IMPULS NCS (MISCELLANEOUS) ×1
INTRAOP MONITOR FEE IMPULSE (MISCELLANEOUS) ×2
IV CATH 14GX2 1/4 (CATHETERS) ×3 IMPLANT
KIT BASIN OR (CUSTOM PROCEDURE TRAY) ×3 IMPLANT
KIT POSITION SURG JACKSON T1 (MISCELLANEOUS) ×3 IMPLANT
KIT ROOM TURNOVER OR (KITS) ×3 IMPLANT
MARKER SKIN DUAL TIP RULER LAB (MISCELLANEOUS) ×3 IMPLANT
MILL MEDIUM DISP (BLADE) ×3 IMPLANT
MIX DBX 10CC 35% BONE (Bone Implant) ×3 IMPLANT
NDL SAFETY ECLIPSE 18X1.5 (NEEDLE) ×1 IMPLANT
NEEDLE 22X1 1/2 (OR ONLY) (NEEDLE) ×3 IMPLANT
NEEDLE HYPO 18GX1.5 SHARP (NEEDLE) ×2
NEEDLE HYPO 25GX1X1/2 BEV (NEEDLE) ×3 IMPLANT
NEEDLE SPNL 18GX3.5 QUINCKE PK (NEEDLE) ×6 IMPLANT
NS IRRIG 1000ML POUR BTL (IV SOLUTION) ×3 IMPLANT
OIL CARTRIDGE MAESTRO DRILL (MISCELLANEOUS) ×3
PACK LAMINECTOMY ORTHO (CUSTOM PROCEDURE TRAY) ×3 IMPLANT
PACK UNIVERSAL I (CUSTOM PROCEDURE TRAY) ×3 IMPLANT
PAD ARMBOARD 7.5X6 YLW CONV (MISCELLANEOUS) ×6 IMPLANT
PATTIES SURGICAL .5 X1 (DISPOSABLE) ×3 IMPLANT
PATTIES SURGICAL .5X1.5 (GAUZE/BANDAGES/DRESSINGS) ×3 IMPLANT
PROBE PEDCLE PROBE MAGSTM DISP (MISCELLANEOUS) ×3 IMPLANT
ROD PRE BENT EXP 40MM (Rod) ×6 IMPLANT
SCREW SET SINGLE INNER (Screw) ×12 IMPLANT
SCREW VIPER CORT FIX 6X35 (Screw) ×12 IMPLANT
SPONGE GAUZE 4X4 12PLY STER LF (GAUZE/BANDAGES/DRESSINGS) ×3 IMPLANT
SPONGE INTESTINAL PEANUT (DISPOSABLE) ×3 IMPLANT
SPONGE SURGIFOAM ABS GEL 100 (HEMOSTASIS) ×3 IMPLANT
STRIP CLOSURE SKIN 1/2X4 (GAUZE/BANDAGES/DRESSINGS) ×4 IMPLANT
SURGIFLO W/THROMBIN 8M KIT (HEMOSTASIS) IMPLANT
SUT MNCRL AB 4-0 PS2 18 (SUTURE) ×6 IMPLANT
SUT VIC AB 0 CT1 18XCR BRD 8 (SUTURE) ×1 IMPLANT
SUT VIC AB 0 CT1 8-18 (SUTURE) ×2
SUT VIC AB 1 CT1 18XCR BRD 8 (SUTURE) ×2 IMPLANT
SUT VIC AB 1 CT1 8-18 (SUTURE) ×4
SUT VIC AB 2-0 CT2 18 VCP726D (SUTURE) ×3 IMPLANT
SYR 20CC LL (SYRINGE) ×3 IMPLANT
SYR BULB IRRIGATION 50ML (SYRINGE) ×3 IMPLANT
SYR CONTROL 10ML LL (SYRINGE) ×6 IMPLANT
SYR TB 1ML LUER SLIP (SYRINGE) ×3 IMPLANT
TAPE CLOTH SURG 6X10 WHT LF (GAUZE/BANDAGES/DRESSINGS) ×3 IMPLANT
TOWEL OR 17X24 6PK STRL BLUE (TOWEL DISPOSABLE) ×3 IMPLANT
TOWEL OR 17X26 10 PK STRL BLUE (TOWEL DISPOSABLE) ×3 IMPLANT
TRAY FOLEY CATH 16FRSI W/METER (SET/KITS/TRAYS/PACK) ×3 IMPLANT
WATER STERILE IRR 1000ML POUR (IV SOLUTION) ×3 IMPLANT
YANKAUER SUCT BULB TIP NO VENT (SUCTIONS) ×3 IMPLANT

## 2015-07-08 NOTE — Progress Notes (Signed)
CSW notified by nursing staff on 3C that patient desires placement at [facility] for short term rehab and recovery. CSW met with patient, husband, daughter and son-in-law this afternoon to discuss. Patient noted to be walking in room with walker and required very little assistance from nursing.  She indicated that she just "thought it would be a good idea to go to [facility]."  Patient had not been seen by PT yet- however, CSW discussed with patient and family that due to the Thanksgiving holiday- [facility] offices would be closed until next Monday and would not be able to obtain auth for SNF. Family indicated she could not afford to pay privately.  Chart review indicates now that per PT evaluation - patient does not need SNF placement.  Plan is to return home with home health is indicated by MD. [facility] would need to arrange if ordered; patient and family state the would agree to PT, RN and home health aide. CSW notified unit RN of this and will sign off as no CSW needs are identified. Patient, husband and daughter all agreed that she could be managed safely at home.  Lorie Phenix. Pauline Good, [place]

## 2015-07-08 NOTE — Transfer of Care (Signed)
Immediate Anesthesia Transfer of Care Note  Patient: [name] [name]  Procedure(s) Performed: Procedure(s) with comments: Left sided lumbar 4-5 Transforaminal lumbar interbody fusion with instrumentation, allograft; Lumbar 3-4, lumbar 4-5 decompression. (Left) - Left sided lumbar 4-5 Transforaminal lumbar interbody fusion with instrumentation, allograft; Lumbar 3-4, lumbar 4-5 decompression.  Patient Location: PACU  Anesthesia Type:General  Level of Consciousness: awake  Airway & Oxygen Therapy: Patient Spontanous Breathing and Patient connected to nasal cannula oxygen  Post-op Assessment: Report given to RN, Post -op Vital signs reviewed and stable and Patient moving all extremities X 4  Post vital signs: stable  Last Vitals:  Filed Vitals:   07/08/15 0659 07/08/15 1259  BP: 147/64   Pulse: 54   Temp: 36.3 C 36.6 C  Resp: 18     Complications: No apparent anesthesia complications

## 2015-07-08 NOTE — Anesthesia Preprocedure Evaluation (Signed)
Anesthesia Evaluation  Patient identified by MRN, date of birth, ID band Patient awake    Reviewed: Allergy & Precautions, NPO status , Patient's Chart, lab work & pertinent test results  History of Anesthesia Complications Negative for: history of anesthetic complications  Airway Mallampati: I  TM Distance: >3 FB Neck ROM: Full    Dental  (+) Teeth Intact, Upper Dentures   Pulmonary neg pulmonary ROS,    breath sounds clear to auscultation       Cardiovascular hypertension, Pt. on medications and Pt. on home beta blockers (-) angina+ CAD and + Cardiac Stents   Rhythm:Regular     Neuro/Psych PSYCHIATRIC DISORDERS Anxiety Depression  Neuromuscular disease    GI/Hepatic Neg liver ROS, GERD  Controlled,  Endo/Other  diabetes, Type 2Hypothyroidism   Renal/GU negative Renal ROS     Musculoskeletal  (+) Arthritis ,   Abdominal   Peds  Hematology negative hematology ROS (+)   Anesthesia Other Findings   Reproductive/Obstetrics                             Anesthesia Physical Anesthesia Plan  ASA: III  Anesthesia Plan: General   Post-op Pain Management:    Induction: Intravenous  Airway Management Planned: Oral ETT  Additional Equipment: None  Intra-op Plan:   Post-operative Plan: Extubation in OR  Informed Consent: I have reviewed the patients History and Physical, chart, labs and discussed the procedure including the risks, benefits and alternatives for the proposed anesthesia with the patient or authorized representative who has indicated his/her understanding and acceptance.   Dental advisory given  Plan Discussed with: CRNA and Surgeon  Anesthesia Plan Comments:         Anesthesia Quick Evaluation

## 2015-07-08 NOTE — Evaluation (Signed)
Physical Therapy Evaluation Patient Details Name: [name] [name] MRN: [id number] DOB: 07-01-1934 Today's Date: 07/08/2015   History of Present Illness  81 y.o. female s/p Left sided lumbar 4-5 Transforaminal lumbar interbody fusion with instrumentation, allograft; Lumbar 3-4, lumbar 4-5 decompression. Hx of CAD, diastolic heart failure, frequent urination at night, and breast cancer.  Clinical Impression  Patient is seen following the above procedure, presenting with functional limitations due to the deficits listed below (see PT Problem List). Demonstrates good control of RW although very slow and guarded gait pattern. Safely completed single step stair training similar to home environment. Reviewed back precautions and safety with mobility. Will follow and progress until discharge.       Follow Up Recommendations No PT follow up;Supervision for mobility/OOB    Equipment Recommendations  None recommended by PT    Recommendations for Other Services OT consult     Precautions / Restrictions Precautions Precautions: Back Precaution Booklet Issued: Yes (comment) Precaution Comments: reviewed back precautions Required Braces or Orthoses: Spinal Brace Spinal Brace: Lumbar corset Restrictions Weight Bearing Restrictions: No      Mobility  Bed Mobility Overal bed mobility: Needs Assistance Bed Mobility: Rolling;Sidelying to Sit Rolling: Supervision Sidelying to sit: Supervision       General bed mobility comments: Educated on log roll technique. Supervision for safety. VC for technique throughout. No physical assist.  Transfers Overall transfer level: Needs assistance Equipment used: Rolling walker (2 wheeled) Transfers: Sit to/from Stand Sit to Stand: Supervision         General transfer comment: Supervision for safety. VC for hand placement. Slow to rise but stable once upright with a walker for support.  Ambulation/Gait Ambulation/Gait assistance:  Supervision Ambulation Distance (Feet): 200 Feet Assistive device: Rolling walker (2 wheeled) Gait Pattern/deviations: Step-through pattern;Decreased stride length Gait velocity: decreased Gait velocity interpretation: Below normal speed for age/gender General Gait Details: Guarded but stable with UE support on a rolling walker. Educated on safe DME use with this device. No buckling noted. Able to ambulate backwards without loss of balance.   Stairs Stairs: Yes Stairs assistance: Supervision Stair Management: No rails;Step to pattern;Backwards;With walker Number of Stairs: 1 General stair comments: Educated on safe backwards approach to stair navigation. performed with supervision for safety and cues for technique. Performed safely.  Wheelchair Mobility    Modified Rankin (Stroke Patients Only)       Balance Overall balance assessment: Needs assistance Sitting-balance support: No upper extremity supported;Feet supported Sitting balance-Leahy Scale: Normal     Standing balance support: No upper extremity supported Standing balance-Leahy Scale: Good                               Pertinent Vitals/Pain Pain Assessment: 0-10 Pain Score: 7  Pain Location: back Pain Descriptors / Indicators: Aching Pain Intervention(s): Monitored during session;Repositioned    Home Living Family/patient expects to be discharged to:: Private residence Living Arrangements: Spouse/significant other Available Help at Discharge: Family;Available 24 hours/day Type of Home: House Home Access: Stairs to enter Entrance Stairs-Rails: None Entrance Stairs-Number of Steps: 1 (threshold) Home Layout: One level Home Equipment: Walker - 2 wheels;Bedside commode      Prior Function Level of Independence: Independent               Hand Dominance        Extremity/Trunk Assessment   Upper Extremity Assessment: Defer to OT evaluation  Lower Extremity Assessment:  Generalized weakness         Communication   Communication: No difficulties  Cognition Arousal/Alertness: Awake/alert Behavior During Therapy: WFL for tasks assessed/performed Overall Cognitive Status: Within Functional Limits for tasks assessed                      General Comments General comments (skin integrity, edema, etc.): Reviewed safety with mobility, use of back brace, self care, and ideas for managing daily tasks in home with husbands assistance.    Exercises        Assessment/Plan    PT Assessment Patient needs continued PT services  PT Diagnosis Difficulty walking;Abnormality of gait;Acute pain;Generalized weakness   PT Problem List Decreased strength;Decreased activity tolerance;Decreased balance;Decreased mobility;Decreased knowledge of use of DME;Decreased knowledge of precautions;Pain  PT Treatment Interventions DME instruction;Gait training;Stair training;Functional mobility training;Therapeutic activities;Therapeutic exercise;Balance training;Neuromuscular re-education;Patient/family education;Modalities   PT Goals (Current goals can be found in the Care Plan section) Acute Rehab PT Goals Patient Stated Goal: No pain and lose weight PT Goal Formulation: With patient Time For Goal Achievement: 07/22/15 Potential to Achieve Goals: Good    Frequency Min 5X/week   Barriers to discharge        Co-evaluation               End of Session Equipment Utilized During Treatment: Back brace Activity Tolerance: Patient tolerated treatment well Patient left: in chair;with call bell/phone within reach Nurse Communication: Mobility status         Time: 1727-1800 PT Time Calculation (min) (ACUTE ONLY): 33 min   Charges:   PT Evaluation $Initial PT Evaluation Tier I: 1 Procedure PT Treatments $Gait Training: 8-22 mins   PT G CodesEllouise Newer 07/08/2015, 6:20 PM  Camille Bal [place], [place]

## 2015-07-08 NOTE — Op Note (Signed)
[name] [name], [name] [name]                  ACCOUNT NO.:  [id number]  MEDICAL RECORD NO.:  [id number]  LOCATION:  [facility]                         FACILITY:  [facility]  PHYSICIAN:  Phylliss Bob, MD      DATE OF BIRTH:  28-Feb-1934  DATE OF PROCEDURE:  07/08/2015                              OPERATIVE REPORT   PREOPERATIVE DIAGNOSES: 1. Spinal stenosis, L3-4, L4-5. 2. Grade 1 L4-5 spondylolisthesis. 3. Severe left leg pain.  POSTOPERATIVE DIAGNOSES: 1. Spinal stenosis, L3-4, L4-5. 2. Grade 1 L4-5 spondylolisthesis. 3. Severe left leg pain.  PROCEDURE: 1. Left-sided transforaminal lumbar interbody fusion, L4-5. 2. Right-sided posterolateral fusion, L4-5. 3. Placement of posterior instrumentation L4, L5 (6 x 35 mm screws). 4. Lumbar decompression, L3-4, L4-5 (for the L4-5 decompression,     substantially more bone and soft tissue removal was required then     that of which is required for the fusion portion of the procedure). 5. Insertion of interbody device x1 (11 x 27 mm Concorde bullet cage). 6. Use of local autograft. 7. Use of morselized allograft. 8. Intraoperative use of fluoroscopy.  SURGEON:  Phylliss Bob, MD.  ASSISTANTPricilla Holm, PA-C.  ANESTHESIA:  General endotracheal anesthesia.  COMPLICATIONS:  None.  DISPOSITION:  Stable.  ESTIMATED BLOOD LOSS:  200 mL.  INDICATIONS FOR SURGERY:  Briefly, [name] [name] is a very pleasant 81 year old female, who did present to me with ongoing and severe pain in the left leg.  The patient's MRI was clearly notable for stenosis at 2 levels including L3-4 and L4-5.  There was also noted to be a grade 1 spondylolisthesis demonstrated at the L4-5 level.  Given the patient's ongoing pain and lack of improvement with multiple forms of appropriate conservative care, we did discuss proceeding with the procedure reflected above.  The patient did fully understand the risks and limitations of the procedure as outlined in my preoperative note,  and did elect to proceed.  OPERATIVE DETAILS:  On July 08, 2015, the patient was brought to surgery and general endotracheal anesthesia was administered.  The patient was placed prone on a well-padded flat Jackson bed with a spinal frame.  Antibiotics were given and a time-out procedure was performed. I then made a midline incision overlying the L3-L5 region.  The fascia was incised at the midline.  The paraspinal musculature was retracted. Self-retaining retractors were placed.  Using anatomic landmarks, I did cannulate the L4 and L5 pedicles using a cortical medial to lateral trajectory.  I did tap up to a 6-mm tap.  A ball-tip probe was used to confirm that there was no cortical violation of the screws.  Then, on the patient's right side, I did decorticate the posterior elements and posterolateral gutter to help aid in the success of the fusion.  A 6 x 35 mm screws were then placed on the right and distraction was applied after placing a 40-mm rod.  Caps were then placed and provisionally tightened.  On the left side, bone wax was placed in the cannulated pedicle holes.  I then proceeded with the decompression portion of the procedure.  I did use a rongeur to remove the spinous processes  of L3 and L4.  I then used a series of Kerrison punches to perform a thorough central and bilateral lateral recess decompression on the right and on the left sides.  I started at the L4-5 level and carried the decompression up to the L3-4 level.  Of note, the decompression did require substantially more bone and soft tissue removal than that of which is required for the fusion.  I was very pleased with the decompression that I was able to accomplish.  Next, with an assistant holding medial retraction of the traversing L5 nerve on the left side, I did use a 15-blade knife to perform an annulotomy at the posterolateral aspect of the L4-5 intervertebral disk.  I then used a series of paddle scrapers  and curettes to perform a thorough and complete L4-5 intervertebral diskectomy.  The intervertebral space was then packed with allograft in the form of DBX mix and as well as autograft from the decompression.  I then placed a series of interbody spacer trials and I did feel that an 11 x 27 mm spacer would be the most appropriate fit. After packing the intervertebral space, I did pack an intervertebral spacer with autograft and allograft.  The spacer was then tamped into position in the usual fashion.  I was very pleased with the press-fit of the spacer.  Distraction was then discontinued on the right side.  I then placed 6 x 35 mm screws on the left and a 40-mm rod was then placed and caps were secured into the tulip heads of the screws and all 4 caps were locked.  On the right side, I did place autograft and allograft into the posterolateral gutter and along the posterior elements to help aid in the fusion.  I was very pleased with the final AP and lateral fluoroscopic images.  Of note, I did liberally use fluoroscopy when cannulating the L4 and L5 pedicles.  I then tested the left pedicle screws using triggered EMG, and there was no screw that tested below 15 milliamps.  The wound was then copiously irrigated.  All bleeding was controlled.  A #15 deep Blake drain was placed.  The wound was then closed in layers using #1 Vicryl followed by 2-0 Vicryl, followed by 3-0 Monocryl.  Benzoin and Steri-Strips were applied followed by a sterile dressing.  All instrument counts were correct at the termination of the procedure.  Of note, Pricilla Holm was my assistant throughout surgery, and did aid in retraction, suctioning, and closure from start to finish.     Phylliss Bob, MD     MD/MEDQ  D:  07/08/2015  T:  07/08/2015  Job:  [id number]

## 2015-07-08 NOTE — Anesthesia Postprocedure Evaluation (Signed)
Anesthesia Post Note  Patient: SHERRYE COLEGROVE  Procedure(s) Performed: Procedure(s) (LRB): Left sided lumbar 4-5 Transforaminal lumbar interbody fusion with instrumentation, allograft; Lumbar 3-4, lumbar 4-5 decompression. (Left)  Patient location during evaluation: PACU Anesthesia Type: General Level of consciousness: awake Pain management: satisfactory to patient Vital Signs Assessment: post-procedure vital signs reviewed and stable Respiratory status: patient connected to nasal cannula oxygen Cardiovascular status: stable Postop Assessment: No signs of nausea or vomiting Anesthetic complications: no    Last Vitals:  Filed Vitals:   07/08/15 1415 07/08/15 1435  BP:  128/71  Pulse: 57 65  Temp:  36.4 C  Resp: 12 16    Last Pain:  Filed Vitals:   07/08/15 1436  PainSc: Asleep    LLE Motor Response: Purposeful movement, Responds to commands LLE Sensation: No numbness RLE Motor Response: Purposeful movement, Responds to commands RLE Sensation: No numbness      Kiam Bransfield

## 2015-07-08 NOTE — Anesthesia Procedure Notes (Signed)
Procedure Name: Intubation Date/Time: 07/08/2015 8:42 AM Performed by: Rejeana Brock L Pre-anesthesia Checklist: Patient identified, Emergency Drugs available, Suction available, Patient being monitored and Timeout performed Patient Re-evaluated:Patient Re-evaluated prior to inductionOxygen Delivery Method: Circle system utilized Preoxygenation: Pre-oxygenation with 100% oxygen Intubation Type: IV induction Ventilation: Mask ventilation without difficulty Laryngoscope Size: Mac and 3 Grade View: Grade I Tube type: Oral Tube size: 7.0 mm Number of attempts: 1 Airway Equipment and Method: Stylet Placement Confirmation: ETT inserted through vocal cords under direct vision,  positive ETCO2 and breath sounds checked- equal and bilateral Secured at: 22 cm Tube secured with: Tape Dental Injury: Teeth and Oropharynx as per pre-operative assessment

## 2015-07-09 LAB — GLUCOSE, CAPILLARY
Glucose-Capillary: 93 mg/dL (ref 65–99)
Glucose-Capillary: 103 mg/dL — ABNORMAL HIGH (ref 65–99)

## 2015-07-09 NOTE — Progress Notes (Signed)
    Patient doing well Patient denies left leg pain, + LBP Has been ambulating   Physical Exam: Filed Vitals:   07/09/15 0009 07/09/15 0459  BP: 156/69 159/64  Pulse: 58 59  Temp: 97.8 F (36.6 C) 97.5 F (36.4 C)  Resp: 18 18   Patient looks well Dressing in place NVI  Drain output: 10cc/12 hours  POD #1 s/p L3-5 decompression and L4/5 fusion, doing well  - up with PT/OT, encourage ambulation - d/c drain - Percocet for pain, Valium for muscle spasms - it appears patient may be able to be discharged home, however, patient wishes to stay another day. Will plan on d/c tomorrow after additional PT/OT

## 2015-07-09 NOTE — Progress Notes (Signed)
Occupational Therapy Evaluation Patient Details Name: [name] [name] MRN: [id number] DOB: [birth year] Today's Date: 07/09/2015    History of Present Illness 81 y.o. female s/p Left sided lumbar 4-5 Transforaminal lumbar interbody fusion with instrumentation, allograft; Lumbar 3-4, lumbar 4-5 decompression. Hx of CAD, diastolic heart failure, frequent urination at night, and breast cancer.   Clinical Impression   PTA, pt independent with ADL and mobility. Pt making excellent progress. Will plan to see in am to complete education regarding compensatory techniques and use of AE/DME for ADL and safe tub transfer technique. Pt ambulated around unit this pm with S @ RW level. Pt will be able to D/C home tomorrow after PT/OT if medically stable.     Follow Up Recommendations  No OT follow up;Supervision - Intermittent    Equipment Recommendations  None recommended by OT    Recommendations for Other Services       Precautions / Restrictions Precautions Precautions: Back Precaution Booklet Issued: Yes (comment) Precaution Comments: reviewed back precautions Required Braces or Orthoses: Spinal Brace Spinal Brace: Lumbar corset Restrictions Weight Bearing Restrictions: No      Mobility Bed Mobility Overal bed mobility: Needs Assistance Bed Mobility: Rolling;Sidelying to Sit Rolling: Supervision Sidelying to sit: Supervision     Sit to sidelying: Supervision General bed mobility comments: Educated on log roll technique. Supervision for safety. VC for technique throughout. No physical assist.  Transfers Overall transfer level: Needs assistance Equipment used: Rolling walker (2 wheeled) Transfers: Sit to/from Stand Sit to Stand: Supervision         Good carry over from earlier session    Balance     Sitting balance-Leahy Scale: Normal       Standing balance-Leahy Scale: Good                              ADL Overall ADL's : Needs assistance/impaired      Grooming: Supervision/safety;Standing   Upper Body Bathing: Set up;Sitting   Lower Body Bathing: Minimal assistance;Sit to/from stand   Upper Body Dressing : Minimal assistance (for donning brace)   Lower Body Dressing: Minimal assistance;Sit to/from stand   Toilet Transfer: Supervision/safety;RW;BSC;Ambulation (over toilet)   Toileting- Clothing Manipulation and Hygiene: Minimal assistance;Sit to/from stand       Functional mobility during ADLs: Supervision/safety;Rolling walker;Cueing for safety General ADL Comments: Began educaiton regarding comepnsatory techniques for ADL and mobility. vc to follow back precautions. Pt has difficulty bringing ankles over knees. will educate on use of AE for B/D and AE for hygiene after toileting if needed. Will also benefit from more education on donning/doffing brace and IADL taks.     Vision     Perception     Praxis      Pertinent Vitals/Pain Pain Assessment: 0-10 Pain Score: 6  Pain Location: back  Pain Descriptors / Indicators: Aching Pain Intervention(s): Limited activity within patient's tolerance     Hand Dominance     Extremity/Trunk Assessment Upper Extremity Assessment Upper Extremity Assessment: Overall WFL for tasks assessed   Lower Extremity Assessment Lower Extremity Assessment: Defer to PT evaluation   Cervical / Trunk Assessment Cervical / Trunk Assessment: Normal   Communication Communication Communication: No difficulties   Cognition Arousal/Alertness: Awake/alert Behavior During Therapy: WFL for tasks assessed/performed Overall Cognitive Status: Within Functional Limits for tasks assessed                     General Comments  Exercises       Shoulder Instructions      Home Living Family/patient expects to be discharged to:: Private residence Living Arrangements: Spouse/significant other Available Help at Discharge: Family;Available 24 hours/day Type of Home: House Home Access:  Stairs to enter [employer] of Steps: 1 (threshold) Entrance Stairs-Rails: None Home Layout: One level     Bathroom Shower/Tub: Tub/shower unit Shower/tub characteristics: Door Biochemist, clinical: Standard Bathroom Accessibility: Yes How Accessible: Accessible via walker Home Equipment: Walker - 2 wheels;Bedside commode;Grab bars - tub/shower;[place], OTR/L  [phone] Dec 01, 2016Prior Functioning/Environment Level of Independence: Independent             OT Diagnosis: Generalized weakness;Acute pain   OT Problem List: Decreased activity tolerance;Decreased knowledge of use of DME or AE;Decreased knowledge of precautions;Pain   OT Treatment/Interventions: Self-care/ADL training;DME and/or AE instruction;Therapeutic activities;Patient/family education    OT Goals(Current goals can be found in the care plan section) Acute Rehab OT Goals Patient Stated Goal: to be albe to do for myself OT Goal Formulation: With patient Time For Goal Achievement: 07/16/15 Potential to Achieve Goals: Good  OT Frequency: Min 2X/week   Barriers to D/C:            Co-evaluation              End of Session Equipment Utilized During Treatment: Gait belt;Rolling walker;Back brace Nurse Communication: Mobility status  Activity Tolerance: Patient tolerated treatment well Patient left: in chair;with call bell/phone within reach   Time: [id number] OT Time Calculation (min): 30 min Charges:  OT General Charges $OT Visit: 1 Procedure OT Evaluation $Initial OT Evaluation Tier I: 1 Procedure OT Treatments $Self Care/Home Management : 8-22 mins G-Codes:    Shaden Higley,HILLARY 07-16-2015, 3:17 PM

## 2015-07-09 NOTE — Progress Notes (Signed)
Physical Therapy Treatment Patient Details Name: [name] [name] MRN: [id number] DOB: 01/11/1934 Today's Date: 07/09/2015    History of Present Illness 81 y.o. female s/p Left sided lumbar 4-5 Transforaminal lumbar interbody fusion with instrumentation, allograft; Lumbar 3-4, lumbar 4-5 decompression. Hx of CAD, diastolic heart failure, frequent urination at night, and breast cancer.    PT Comments    Patient progressing well with mobility/gait.  Continues to need supervision for safety.  Follow Up Recommendations  No PT follow up;Supervision for mobility/OOB     Equipment Recommendations  None recommended by PT    Recommendations for Other Services       Precautions / Restrictions Precautions Precautions: Back Precaution Comments: Patient able to recall 2/3 back precautions.  Reviewed with patient. Required Braces or Orthoses: Spinal Brace Spinal Brace: Lumbar corset Restrictions Weight Bearing Restrictions: No    Mobility  Bed Mobility Overal bed mobility: Needs Assistance Bed Mobility: Rolling;Sit to Sidelying Rolling: Supervision       Sit to sidelying: Supervision General bed mobility comments: Reviewed technique.  No physical assist needed.  Transfers Overall transfer level: Needs assistance Equipment used: Rolling walker (2 wheeled) Transfers: Sit to/from Stand Sit to Stand: Supervision         General transfer comment: Supervision for safety. VC for hand placement. Slow to rise but stable once upright with a walker for support.  Ambulation/Gait Ambulation/Gait assistance: Supervision Ambulation Distance (Feet): 400 Feet Assistive device: Rolling walker (2 wheeled) Gait Pattern/deviations: Step-through pattern;Decreased stride length Gait velocity: decreased Gait velocity interpretation: Below normal speed for age/gender General Gait Details: Patient demonstrates safe use of RW.  Guarded gait.  No loss of balance.   Stairs             Wheelchair Mobility    Modified Rankin (Stroke Patients Only)       Balance                                    Cognition Arousal/Alertness: Awake/alert Behavior During Therapy: WFL for tasks assessed/performed Overall Cognitive Status: Within Functional Limits for tasks assessed                      Exercises      General Comments General comments (skin integrity, edema, etc.): Reviewed mobility at home, encouraging frequent position changes.      Pertinent Vitals/Pain Pain Assessment: 0-10 Pain Score: 4  Pain Location: back Pain Descriptors / Indicators: Aching Pain Intervention(s): Monitored during session;Repositioned    Home Living                      Prior Function            PT Goals (current goals can now be found in the care plan section) Progress towards PT goals: Progressing toward goals    Frequency  Min 5X/week    PT Plan Current plan remains appropriate    Co-evaluation             End of Session Equipment Utilized During Treatment: Back brace Activity Tolerance: Patient tolerated treatment well Patient left: in bed;with call bell/phone within reach     Time: 1143-1207 PT Time Calculation (min) (ACUTE ONLY): 24 min  Charges:  $Gait Training: 23-37 mins                    G Codes:  Despina Pole 07/09/2015, 12:15 PM Carita Pian. Sanjuana Kava, [place] Pager [phone]

## 2015-07-10 NOTE — Progress Notes (Signed)
    Patient doing well Patient denies left leg pain, + LBP Has been ambulating, transitioning in/out bed with therapy   Physical Exam: Filed Vitals:   07/09/15 2035 07/10/15 0700  BP: 146/63 145/66  Pulse: 87 85  Temp: 98 F (36.7 C) 98.4 F (36.9 C)  Resp: 18 17   Patient looks well Dressing in place NVI  POD #2 s/p L3-5 decompression and L4/5 fusion, doing well  - up with PT/OT, encourage ambulation - Percocet for pain, Valium for muscle spasms - has cleared PT/OT for d/c home--will d/c home today

## 2015-07-10 NOTE — Discharge Summary (Signed)
Physician Discharge Summary  Patient ID: [name] [name] MRN: [id number] DOB/AGE: 1934/08/12 81 y.o.  Admit date: 07/08/2015 Discharge date: 07/10/2015  Admission Diagnoses:  Spinal stenosis with radiculopathy  Discharge Diagnoses:  Active Problems:   Radiculopathy   Past Medical History  Diagnosis Date  . Hypercholesterolemia   . CAD (coronary artery disease)     The patient had a left heart catheterization in August 2006 showed an EF of 70%, a 70% mid LAD lesion and 80% distal LAD ledsion, as well as 50% ostial first diagonal lesion.  The patient did have a drug-eluting stent placed in her mid LAD at that time.    . Diastolic heart failure     Echo 11/10 with EF 55-60%, moderate diastolic dysfunction, no regional WMAs, mild to moderate TR, moderate LAE, mild AI  . Hypertension   . Hypothyroidism   . Breast cancer ([place])     treated with mastectomy/tomoxifen in early 2000s  . Bronchitis     hx of  . Mental disorder   . Depression   . Frequent urination at night   . Heart murmur     "small"  . Anginal pain ([place])   . Type II diabetes mellitus ([place])     "controlled w/diet and exercise"  . GERD (gastroesophageal reflux disease)     "gone since gallbladder took out"  . Arthritis     "little in my thumbs"  . Anxiety     Surgeries: Procedure(s): Left sided lumbar 4-5 Transforaminal lumbar interbody fusion with instrumentation, allograft; Lumbar 3-4, lumbar 4-5 decompression. on 07/08/2015   Consultants (if any):    Discharged Condition: Improved  Hospital Course: [name] [name] is an 81 y.o. female who was admitted 07/08/2015 with a diagnosis of spinal stenosis with radiculopathy and went to the operating room on 07/08/2015 and underwent the above named procedures.  Her postop course was uncomplicated, and she was d/c home tol PO and having cleared PT for home d/c.  She was given perioperative antibiotics:  Anti-infectives    Start     Dose/Rate Route Frequency Ordered Stop    07/08/15 1630  ceFAZolin (ANCEF) IVPB 1 g/50 mL premix     1 g 100 mL/hr over 30 Minutes Intravenous Every 8 hours 07/08/15 1454 07/09/15 0129   07/08/15 0800  ceFAZolin (ANCEF) IVPB 2 g/50 mL premix     2 g 100 mL/hr over 30 Minutes Intravenous To [facility] 07/07/15 1317 07/08/15 0901    .  She was given sequential compression devices, early ambulation for DVT prophylaxis.  She benefited maximally from the hospital stay and there were no complications.    Recent vital signs:  Filed Vitals:   07/09/15 2035 07/10/15 0700  BP: 146/63 145/66  Pulse: 87 85  Temp: 98 F (36.7 C) 98.4 F (36.9 C)  Resp: 18 17    Recent laboratory studies:  Lab Results  Component Value Date   HGB 12.6 06/25/2015   HGB 11.8* 06/04/2013   HGB 13.0 03/30/2012   Lab Results  Component Value Date   WBC 5.9 06/25/2015   PLT 327 06/25/2015   Lab Results  Component Value Date   INR 0.99 06/25/2015   Lab Results  Component Value Date   NA 138 06/25/2015   K 3.6 06/25/2015   CL 100* 06/25/2015   CO2 30 06/25/2015   BUN 7 06/25/2015   CREATININE 0.83 06/25/2015   GLUCOSE 93 06/25/2015    Discharge Medications:  Medication List    TAKE these medications        aspirin 81 MG tablet  Take 81 mg by mouth daily.     atorvastatin 80 MG tablet  Commonly known as:  LIPITOR  Take 1 tablet (80 mg total) by mouth daily.     calcium carbonate 600 MG Tabs tablet  Commonly known as:  OS-CAL  Take 600 mg by mouth daily with breakfast.     ezetimibe-simvastatin 10-40 MG tablet  Commonly known as:  VYTORIN  Take 1 tablet by mouth at bedtime.     hydrochlorothiazide 12.5 MG capsule  Commonly known as:  MICROZIDE  Take 1 capsule (12.5 mg total) by mouth daily.     isosorbide mononitrate 120 MG 24 hr tablet  Commonly known as:  IMDUR  Take 120 mg by mouth Daily.     levothyroxine 75 MCG tablet  Commonly known as:  SYNTHROID, LEVOTHROID  Take 75 mcg by mouth daily.      losartan 100 MG tablet  Commonly known as:  COZAAR  Take 1 tablet (100 mg total) by mouth daily.     metoprolol succinate 50 MG 24 hr tablet  Commonly known as:  TOPROL-XL  Take 50 mg by mouth daily.     traZODone 150 MG tablet  Commonly known as:  DESYREL  Take 150 mg by mouth at bedtime.     venlafaxine XR 150 MG 24 hr capsule  Commonly known as:  EFFEXOR-XR  Take 150 mg by mouth 2 (two) times daily.        Diagnostic Studies: Dg Chest 2 View  06/25/2015  CLINICAL DATA:  For posterior lumbar spine fusion EXAM: CHEST  2 VIEW COMPARISON:  Chest x-ray of 06/04/2013 FINDINGS: The lungs remain clear but somewhat hyperaerated. Mediastinal and hilar contours are is unchanged. The heart is mildly enlarged and stable. No bony abnormality is seen. IMPRESSION: No change in hyper aeration and mild cardiomegaly. No active lung disease. Electronically Signed   By: Ivar Drape M.D.   On: 06/25/2015 09:50   Dg Lumbar Spine 2-3 Views  07/08/2015  CLINICAL DATA:  L4-5 TLIF EXAM: DG C-ARM 61-120 MIN; LUMBAR SPINE - 2-3 VIEW COMPARISON:  None. FLUOROSCOPY TIME:  Radiation Exposure Index (as provided by the fluoroscopic device): Not available If the device does not provide the exposure index: Fluoroscopy Time:  27 seconds Number of Acquired Images:  2 FINDINGS: Interbody fusion is now seen at L4-5 with pedicle screws and posterior fixation. No acute abnormality is noted. IMPRESSION: L4-5 TLIF Electronically Signed   By: Inez Catalina M.D.   On: 07/08/2015 12:19   Dg Lumbar Spine 1 View  07/08/2015  CLINICAL DATA:  For L4-5 posterior fusion EXAM: LUMBAR SPINE - 1 VIEW COMPARISON:  MR lumbar spine of 01/29/2015 FINDINGS: A cross-table lateral view the lumbar spine labeled # 1 was returned from the operating room. A needle is positioned between the spinous processes of L2 and L3 as well as a second needle directed toward the interspinous region of L4-5 posteriorly. IMPRESSION: Needles positioned posteriorly  directed between the spinous processes of L2-3 and L4-5. Electronically Signed   By: Ivar Drape M.D.   On: 07/08/2015 10:54   Dg C-arm 1-60 Min  07/08/2015  CLINICAL DATA:  L4-5 TLIF EXAM: DG C-ARM 61-120 MIN; LUMBAR SPINE - 2-3 VIEW COMPARISON:  None. FLUOROSCOPY TIME:  Radiation Exposure Index (as provided by the fluoroscopic device): Not available If the device does not provide  the exposure index: Fluoroscopy Time:  27 seconds Number of Acquired Images:  2 FINDINGS: Interbody fusion is now seen at L4-5 with pedicle screws and posterior fixation. No acute abnormality is noted. IMPRESSION: L4-5 TLIF Electronically Signed   By: Inez Catalina M.D.   On: 07/08/2015 12:19    Disposition: 01-Home or Self Care        Follow-up Information    Follow up with Sinclair Ship, MD.   Specialty:  Orthopedic Surgery   Contact information:   [address] [place] [id number] [phone]        Signed: Grandville Silos, Giannina Bartolome A. 07/10/2015, 10:06 AM

## 2015-07-10 NOTE — Discharge Instructions (Signed)
See pre-printed info sheets

## 2015-07-10 NOTE — Progress Notes (Signed)
Utilization review completed.  

## 2015-07-10 NOTE — Progress Notes (Signed)
Occupational Therapy Treatment Patient Details Name: [name] [name] MRN: [id number] DOB: September 04, 1933 Today's Date: 07/10/2015    History of present illness 81 y.o. female s/p Left sided lumbar 4-5 Transforaminal lumbar interbody fusion with instrumentation, allograft; Lumbar 3-4, lumbar 4-5 decompression. Hx of CAD, diastolic heart failure, frequent urination at night, and breast cancer.   OT comments  Pt making good progress toward OT goals. Educated pt on use of AE for increased independence with ADLs; pt able to return demonstrate use of reacher and sock aide and verbalized understanding of toilet tongs, long handled sponge, and long handled shoe horn. Pt currently supervision for functional mobility and min guard with verbal cues for technique for bed mobility. D/c plan remains appropriate at this time. Will continue to follow pt acutely.   Follow Up Recommendations  No OT follow up;Supervision - Intermittent    Equipment Recommendations  None recommended by OT    Recommendations for Other Services      Precautions / Restrictions Precautions Precautions: Back Precaution Comments: Pt able to recall 3/3, reviewed precautions Required Braces or Orthoses: Spinal Brace Spinal Brace: Lumbar corset Restrictions Weight Bearing Restrictions: No       Mobility Bed Mobility Overal bed mobility: Needs Assistance Bed Mobility: Rolling;Sit to Sidelying Rolling: Supervision       Sit to sidelying: Min guard General bed mobility comments: VC for technique and maintaining back precautions.  Transfers Overall transfer level: Needs assistance Equipment used: Rolling walker (2 wheeled) Transfers: Sit to/from Stand Sit to Stand: Supervision         General transfer comment: VC for hand placement and maintaining precautions    Balance Overall balance assessment: Needs assistance Sitting-balance support: Feet supported;No upper extremity supported Sitting balance-Leahy Scale: Fair     Standing balance support: During functional activity Standing balance-Leahy Scale: Fair Standing balance comment: using rw                   ADL Overall ADL's : Needs assistance/impaired     Grooming: Supervision/safety;Standing;Wash/dry hands Grooming Details (indicate cue type and reason): Able to stand at sink and wash hands with supervision     Lower Body Bathing: Supervison/ safety;Sit to/from stand;With adaptive equipment Lower Body Bathing Details (indicate cue type and reason): Educated on use of long handled sponge for increased independence with LB bathing; pt verbalized understanding.     Lower Body Dressing: Supervision/safety;Sit to/from stand;With adaptive equipment Lower Body Dressing Details (indicate cue type and reason): Educated pt on use of long handled shoe horn, reacher, and sock aide; pt able to return demonstrate use of reacher and sock aide to doff/don sock. Toilet Transfer: Supervision/safety;Ambulation;BSC;RW (BSC over toilet)   Toileting- Clothing Manipulation and Hygiene: Supervision/safety;Sit to/from stand Toileting - Clothing Manipulation Details (indicate cue type and reason): Educated pt on toilet tongs for increased independence with toilet hygiene; pt verbalized understanding.    Tub/Shower Transfer Details (indicate cue type and reason): Pt declined practicing tub transfer today; in too much pain. Educated on completing dry run of tub transfer with [facility] therapist prior to attempting on her own; pt verbalized understanding and stated she would take sponge baths until then. Functional mobility during ADLs: Supervision/safety;Rolling walker General ADL Comments: No family present for OT session. Educate on AE, compensatory strategies for ADLs, back precautions; pt verbalized understanding.      Vision                     Perception  Praxis      Cognition   Behavior During Therapy: WFL for tasks assessed/performed Overall  Cognitive Status: Within Functional Limits for tasks assessed                       Extremity/Trunk Assessment               Exercises     Shoulder Instructions       General Comments      Pertinent Vitals/ Pain       Pain Assessment: 0-10 Pain Score: 8  Pain Location: back Pain Descriptors / Indicators: Aching;Sharp Pain Intervention(s): Limited activity within patient's tolerance;Monitored during session;Repositioned  Home Living                                          Prior Functioning/Environment              Frequency Min 2X/week     Progress Toward Goals  OT Goals(current goals can now be found in the care plan section)  Progress towards OT goals: Progressing toward goals  Acute Rehab OT Goals Patient Stated Goal: to have less pain in back OT Goal Formulation: With patient  Plan Discharge plan remains appropriate    Co-evaluation                 End of Session Equipment Utilized During Treatment: Rolling walker;Back brace   Activity Tolerance Patient limited by pain   Patient Left in bed;with call bell/phone within reach;Other (comment) (MD in room )   Nurse Communication          Time: [phone] OT Time Calculation (min): 21 min  Charges: OT General Charges $OT Visit: 1 Procedure OT Treatments $Self Care/Home Management : 8-22 mins  Binnie Kand M.S., OTR/L Pager: [phone]  07/10/2015, 10:15 AM

## 2015-07-10 NOTE — Progress Notes (Signed)
Physical Therapy Treatment Patient Details Name: [name] [name] MRN: [id number] DOB: [birth year] Today's Date: 07/10/2015    History of Present Illness 81 y.o. female s/p Left sided lumbar 4-5 Transforaminal lumbar interbody fusion with instrumentation, allograft; Lumbar 3-4, lumbar 4-5 decompression. Hx of CAD, diastolic heart failure, frequent urination at night, and breast cancer.    PT Comments    Patient seen for PT session with focus on ambulation. Patient was steady while using rw but reported some mild feeling of lightheadedness. Patient was assisted back to room and left sitting up for breakfast. Nursing notified of patient request for pain meds and lightheadedness. Will continue to follow as able. Anticipate D/C to home with family assistance.  Follow Up Recommendations  No PT follow up;Supervision for mobility/OOB     Equipment Recommendations  None recommended by PT    Recommendations for Other Services       Precautions / Restrictions Precautions Precautions: Back Precaution Comments: reviewed precautions, patient recalling 1/3. Required Braces or Orthoses: Spinal Brace Spinal Brace: Lumbar corset Restrictions Weight Bearing Restrictions: No    Mobility  Bed Mobility               General bed mobility comments: patient sitting edge of bed with nursing upon arrival  Transfers Overall transfer level: Needs assistance Equipment used: Rolling walker (2 wheeled) Transfers: Sit to/from Stand Sit to Stand: Min assist         General transfer comment: cues for hand position  Ambulation/Gait Ambulation/Gait assistance: Supervision Ambulation Distance (Feet): 75 Feet Assistive device: Rolling walker (2 wheeled) Gait Pattern/deviations: Step-through pattern;Decreased step length - right Gait velocity: decreased   General Gait Details: no loss of balance, patient reporting that she was beginning to feel a little lightheaded, returned to room.     Stairs Stairs:  (declined, states she will not have any steps at home)          Wheelchair Mobility    Modified Rankin (Stroke Patients Only)       Balance Overall balance assessment: Needs assistance Sitting-balance support: No upper extremity supported Sitting balance-Leahy Scale: Good     Standing balance support: During functional activity Standing balance-Leahy Scale: Fair Standing balance comment: using rw                    Cognition Arousal/Alertness: Awake/alert Behavior During Therapy: WFL for tasks assessed/performed Overall Cognitive Status: Within Functional Limits for tasks assessed                      Exercises      General Comments General comments (skin integrity, edema, etc.): supervision with donning brace      Pertinent Vitals/Pain Pain Assessment: 0-10 Pain Score: 10-Worst pain ever Pain Location: back Pain Descriptors / Indicators: Aching Pain Intervention(s): Limited activity within patient's tolerance;Monitored during session    Home Living                      Prior Function            PT Goals (current goals can now be found in the care plan section) Acute Rehab PT Goals Patient Stated Goal: be able to get up and moving PT Goal Formulation: With patient Time For Goal Achievement: 07/22/15 Potential to Achieve Goals: Good Progress towards PT goals: Progressing toward goals    Frequency  Min 5X/week    PT Plan Current plan remains appropriate    Co-evaluation  End of Session Equipment Utilized During Treatment: Back brace Activity Tolerance: Patient limited by fatigue;Patient limited by pain;Other (comment) (feeling a little lightheaded.) Patient left: in chair;with call bell/phone within reach     Time: 0828-0848 PT Time Calculation (min) (ACUTE ONLY): 20 min  Charges:  $Gait Training: 8-22 mins                    G Codes:      Cassell Clement, PT, CSCS Pager [phone] Office [phone]  07/10/2015, 8:55 AM

## 2015-07-13 MED FILL — Sodium Chloride Irrigation Soln 0.9%: Qty: 3000 | Status: AC

## 2015-07-13 MED FILL — Heparin Sodium (Porcine) Inj 1000 Unit/ML: INTRAMUSCULAR | Qty: 30 | Status: AC

## 2015-07-13 MED FILL — Sodium Chloride IV Soln 0.9%: INTRAVENOUS | Qty: 1000 | Status: AC

## 2015-07-23 ENCOUNTER — Encounter (HOSPITAL_COMMUNITY): Payer: Self-pay | Admitting: Orthopedic Surgery

## 2015-07-23 MED ORDER — THROMBIN 20000 UNITS EX KIT
PACK | CUTANEOUS | Status: DC | PRN
  Administered 2015-07-23: 40000 [IU] via TOPICAL

## 2015-07-30 ENCOUNTER — Ambulatory Visit (INDEPENDENT_AMBULATORY_CARE_PROVIDER_SITE_OTHER): Payer: Medicare Other | Admitting: Cardiology

## 2015-07-30 ENCOUNTER — Encounter: Payer: Self-pay | Admitting: Cardiology

## 2015-07-30 VITALS — BP 156/78 | HR 61 | Ht 63.0 in | Wt 168.0 lb

## 2015-07-30 DIAGNOSIS — I2510 Atherosclerotic heart disease of native coronary artery without angina pectoris: Secondary | ICD-10-CM | POA: Diagnosis not present

## 2015-07-30 DIAGNOSIS — I10 Essential (primary) hypertension: Secondary | ICD-10-CM | POA: Diagnosis not present

## 2015-07-30 DIAGNOSIS — E785 Hyperlipidemia, unspecified: Secondary | ICD-10-CM | POA: Diagnosis not present

## 2015-07-30 LAB — LIPID PANEL
Cholesterol: 146 mg/dL (ref 125–200)
HDL: 66 mg/dL (ref >=46)
LDL Cholesterol: 55 mg/dL (ref <130)
Total CHOL/HDL Ratio: 2.2 Ratio (ref <=5.0)
Triglycerides: 123 mg/dL (ref <150)
VLDL: 25 mg/dL (ref <30)

## 2015-07-30 NOTE — Patient Instructions (Addendum)
Medication Instructions:  No changes today  Labwork: Lipid profile today  Testing/Procedures: None   Follow-Up: Your physician wants you to follow-up in: 1 year with Dr Aundra Dubin (December 2017).  You will receive a reminder letter in the mail two months in advance. If you don't receive a letter, please call our office to schedule the follow-up appointment.        If you need a refill on your cardiac medications before your next appointment, please call your pharmacy.

## 2015-08-01 NOTE — Progress Notes (Signed)
Patient ID: [name] [name], female   DOB: 1933/12/15, 81 y.o.   MRN: [id number] PCP: Dr. Elisha Ponder [facility])  81 yo with h/o CAD s/p PCI to LAD presents for cardiology followup.  Given chest pain and shortness of breath, she had a left heart cath in 11/10 that showed a patent proximal LAD stent and 50% mid LAD stenosis, similar to prior cath (2006).  Echo in 8/14 showed EF 60-65% with grade II diastolic dysfunction and mild AI.  Since her last appointment here, She had L-spine surgery without complications.   She has not been very active due to the ongoing back pain.  Weight is up considerably due to lack of activity.  She is doing some walking with a walker.  No dyspnea.  No chest pain.   Labs (7/10): HDL 72, LDL 59, LFTs normal Labs (8/13): K 3.9, creatinine 0.71 Labs (11/16): K 3.6, creatinine 0.83  ECG: NSR, normal.   Allergies (verified):  No Known Drug Allergies  Past Medical History: 1. Hypercholesterolemia. 2. Coronary artery disease.  The patient had a left heart catheterization in August 2006 showed an EF of 70%, a 70% mid LAD lesion and 80% distal LAD lesion, as well as a 50% ostial first diagonal lesion.  The patient did have a drug-eluting stent placed in her mid LAD at that time.  She was on Plavix for 2-1/2 years. She stopped Plavix in January 2009.  Adenosine myoview (9/09):  EF 69% with normal perfusion images suggesting no ischemia or infarction.  Repeat LHC for CP (11/10) showed patent prox LAD stent, 50% mLAD stenosis.  Study was similar to 2006.  3. Diastolic CHF: Echo ([id number]) with EF 55-60%, moderate diastolic dysfunction, no regional WMAs, mild to moderate TR, moderate LAE, mild AI.  Echo (8/14) with EF 60-65%, grade II diastolic dysfunction, mild AI, PA systolic pressure 35 mmHg.  4. Hypertension. 5. Hypothyroidism.  6. Breast CA: treated with mastectomy/tamoxifen in early 2000s.   7. Rotator cuff surgery 8. L-spine surgery 11/16 9. Sleep study 12/14 with very mild  OSA.   Family History: No premature CAD   Social History: The patient works part-time at a day care.  She is a nonsmoker.She lives in [place]. She is married.   ROS: All systems reviewed and negative except as per HPI.   Current Outpatient Prescriptions  Medication Sig Dispense Refill  . aspirin 81 MG tablet Take 81 mg by mouth daily.      . calcium carbonate (OS-CAL) 600 MG TABS tablet Take 600 mg by mouth daily with breakfast.    . ezetimibe-simvastatin (VYTORIN) 10-40 MG per tablet Take 1 tablet by mouth at bedtime.    . hydrochlorothiazide (MICROZIDE) 12.5 MG capsule Take 1 capsule (12.5 mg total) by mouth daily. 90 capsule 3  . isosorbide mononitrate (IMDUR) 120 MG 24 hr tablet Take 120 mg by mouth Daily.     Marland Kitchen levothyroxine (SYNTHROID, LEVOTHROID) 75 MCG tablet Take 75 mcg by mouth daily.     Marland Kitchen losartan (COZAAR) 100 MG tablet Take 1 tablet (100 mg total) by mouth daily. 90 tablet 3  . metoprolol (TOPROL-XL) 50 MG 24 hr tablet Take 50 mg by mouth daily.      . traZODone (DESYREL) 150 MG tablet Take 150 mg by mouth at bedtime.    Marland Kitchen venlafaxine XR (EFFEXOR-XR) 150 MG 24 hr capsule Take 150 mg by mouth 2 (two) times daily.  2   No current facility-administered medications for this visit.  BP 156/78 mmHg  Pulse 61  Ht 5\' 3"  (1.6 m)  Wt 168 lb (76.204 kg)  BMI 29.77 kg/m2 General: NAD Neck: No JVD, no thyromegaly or thyroid nodule.  Lungs: Clear to auscultation bilaterally with normal respiratory effort. CV: Nondisplaced PMI.  Heart regular S1/S2, no S3/S4, no murmur.  No peripheral edema.  No carotid bruit.  Normal pedal pulses.  Abdomen: Soft, nontender, no hepatosplenomegaly, no distention.  Neurologic: Alert and oriented x 3.  Psych: Normal affect. Extremities: No clubbing or cyanosis.   Assessment/Plan: 1. HTN: BP is high here.  She checks it at home, it runs in the [id number] systolic.  2. Hyperlipidemia: Continue Vytorin, check lipids.    3. CAD: No ischemic  symptoms.  Continue ASA, statin.   Loralie Champagne 08/01/2015

## 2015-09-14 ENCOUNTER — Other Ambulatory Visit: Payer: Self-pay | Admitting: Orthopaedic Surgery

## 2015-10-01 DIAGNOSIS — Z9181 History of falling: Secondary | ICD-10-CM | POA: Insufficient documentation

## 2015-10-02 ENCOUNTER — Encounter (HOSPITAL_COMMUNITY)
Admission: RE | Admit: 2015-10-02 | Discharge: 2015-10-02 | Disposition: A | Discharge status: Home / Self Care | Payer: Medicare Other | Source: Ambulatory Visit | Attending: Orthopaedic Surgery | Admitting: Orthopaedic Surgery

## 2015-10-02 ENCOUNTER — Ambulatory Visit (HOSPITAL_COMMUNITY): Admission: RE | Admit: 2015-10-02 | Payer: Medicare Other | Source: Ambulatory Visit

## 2015-10-02 ENCOUNTER — Encounter (HOSPITAL_COMMUNITY): Payer: Self-pay

## 2015-10-02 DIAGNOSIS — Z7982 Long term (current) use of aspirin: Secondary | ICD-10-CM | POA: Diagnosis not present

## 2015-10-02 DIAGNOSIS — Z853 Personal history of malignant neoplasm of breast: Secondary | ICD-10-CM | POA: Diagnosis not present

## 2015-10-02 DIAGNOSIS — E785 Hyperlipidemia, unspecified: Secondary | ICD-10-CM | POA: Insufficient documentation

## 2015-10-02 DIAGNOSIS — Z01812 Encounter for preprocedural laboratory examination: Secondary | ICD-10-CM | POA: Diagnosis not present

## 2015-10-02 DIAGNOSIS — E039 Hypothyroidism, unspecified: Secondary | ICD-10-CM | POA: Insufficient documentation

## 2015-10-02 DIAGNOSIS — E119 Type 2 diabetes mellitus without complications: Secondary | ICD-10-CM | POA: Insufficient documentation

## 2015-10-02 DIAGNOSIS — I10 Essential (primary) hypertension: Secondary | ICD-10-CM | POA: Diagnosis not present

## 2015-10-02 DIAGNOSIS — M1712 Unilateral primary osteoarthritis, left knee: Secondary | ICD-10-CM | POA: Insufficient documentation

## 2015-10-02 DIAGNOSIS — Z955 Presence of coronary angioplasty implant and graft: Secondary | ICD-10-CM | POA: Diagnosis not present

## 2015-10-02 DIAGNOSIS — Z01818 Encounter for other preprocedural examination: Secondary | ICD-10-CM | POA: Insufficient documentation

## 2015-10-02 DIAGNOSIS — I2510 Atherosclerotic heart disease of native coronary artery without angina pectoris: Secondary | ICD-10-CM | POA: Diagnosis not present

## 2015-10-02 DIAGNOSIS — Z981 Arthrodesis status: Secondary | ICD-10-CM | POA: Insufficient documentation

## 2015-10-02 DIAGNOSIS — K219 Gastro-esophageal reflux disease without esophagitis: Secondary | ICD-10-CM | POA: Diagnosis not present

## 2015-10-02 DIAGNOSIS — Z79899 Other long term (current) drug therapy: Secondary | ICD-10-CM | POA: Diagnosis not present

## 2015-10-02 DIAGNOSIS — Z0183 Encounter for blood typing: Secondary | ICD-10-CM | POA: Diagnosis not present

## 2015-10-02 HISTORY — DX: Sleep disorder, unspecified: G47.9

## 2015-10-02 LAB — CBC WITH DIFFERENTIAL/PLATELET
Basophils Absolute: 0 10*3/uL (ref 0.0–0.1)
Basophils Relative: 0 %
Eosinophils Absolute: 0 10*3/uL (ref 0.0–0.7)
Eosinophils Relative: 0 %
HCT: 41.7 % (ref 36.0–46.0)
Hemoglobin: 13.9 g/dL (ref 12.0–15.0)
Lymphocytes Relative: 13 %
Lymphs Abs: 1.5 10*3/uL (ref 0.7–4.0)
MCH: 29.8 pg (ref 26.0–34.0)
MCHC: 33.3 g/dL (ref 30.0–36.0)
MCV: 89.5 fL (ref 78.0–100.0)
Monocytes Absolute: 0.8 10*3/uL (ref 0.1–1.0)
Monocytes Relative: 7 %
Neutro Abs: 9 10*3/uL — ABNORMAL HIGH (ref 1.7–7.7)
Neutrophils Relative %: 80 %
Platelets: 261 10*3/uL (ref 150–400)
RBC: 4.66 MIL/uL (ref 3.87–5.11)
RDW: 13.6 % (ref 11.5–15.5)
WBC: 11.4 10*3/uL — ABNORMAL HIGH (ref 4.0–10.5)

## 2015-10-02 LAB — URINALYSIS, ROUTINE W REFLEX MICROSCOPIC
Glucose, UA: NEGATIVE mg/dL
Ketones, ur: NEGATIVE mg/dL
Nitrite: NEGATIVE
Protein, ur: NEGATIVE mg/dL
Specific Gravity, Urine: 1.027 (ref 1.005–1.030)
pH: 5.5 (ref 5.0–8.0)

## 2015-10-02 LAB — URINE MICROSCOPIC-ADD ON

## 2015-10-02 LAB — PROTIME-INR
INR: 0.98 (ref 0.00–1.49)
Prothrombin Time: 13.2 seconds (ref 11.6–15.2)

## 2015-10-02 LAB — TYPE AND SCREEN
ABO/RH(D): O NEG
Antibody Screen: NEGATIVE

## 2015-10-02 LAB — BASIC METABOLIC PANEL
Anion gap: 13 (ref 5–15)
BUN: 11 mg/dL (ref 6–20)
CO2: 30 mmol/L (ref 22–32)
Calcium: 10.3 mg/dL (ref 8.9–10.3)
Chloride: 99 mmol/L — ABNORMAL LOW (ref 101–111)
Creatinine, Ser: 0.84 mg/dL (ref 0.44–1.00)
GFR calc Af Amer: >60 mL/min (ref >60)
GFR calc non Af Amer: >60 mL/min (ref >60)
Glucose, Bld: 101 mg/dL — ABNORMAL HIGH (ref 65–99)
Potassium: 3.6 mmol/L (ref 3.5–5.1)
Sodium: 142 mmol/L (ref 135–145)

## 2015-10-02 LAB — SURGICAL PCR SCREEN
MRSA, PCR: NEGATIVE
Staphylococcus aureus: NEGATIVE

## 2015-10-02 LAB — APTT: aPTT: 27 seconds (ref 24–37)

## 2015-10-02 NOTE — Pre-Procedure Instructions (Signed)
Hira Knitter [place]  10/02/2015      [facility] - [place], [place] - [address] [place] [address] Phone: [phone] Fax: [phone]  [facility] [place], [place] [address] Phone: [phone] Fax: [phone]  [facility] ORDER) [place], [place] [address] Phone: [phone] Fax: [place], [address] [place] [phone] [place] [address] Phone: [phone] Fax: [phone]    Your procedure is scheduled on 10/13/2015  Report to [facility] Admitting at 8:00 A.M.  Call this number if you have problems the morning of surgery:  [phone]   Remember:  Do not eat food or drink liquids after midnight. On Monday   Take these medicines the morning of surgery with A SIP OF WATER :Isosorbide, Thyroid medicine, Metoprolol, Effexor               STOP Asprin on 10/08/2015   Do not wear jewelry, make-up or nail polish.   Do not wear lotions, powders, or perfumes.  You may wear deodorant.   Do not shave 48 hours prior to surgery.     Do not bring valuables to the hospital.   [facility] is not responsible for any belongings or valuables.  Contacts, dentures or bridgework may not be worn into surgery.  Leave your suitcase in the car.  After surgery it may be brought to your room.  For patients admitted to the hospital, discharge time will be determined by your treatment team.  Patients discharged the day of surgery will not be allowed to drive home.   Name and phone number of your driver:   With family Special instructions:  Special Instructions: [place] - Preparing for Surgery  Before surgery, you can play an important role.  Because skin is not sterile, your skin needs  to be as free of germs as possible.  You can reduce the number of germs on you skin by washing with CHG (chlorahexidine gluconate) soap before surgery.  CHG is an antiseptic cleaner which kills germs and bonds with the skin to continue killing germs even after washing.  Please DO NOT use if you have an allergy to CHG or antibacterial soaps.  If your skin becomes reddened/irritated stop using the CHG and inform your nurse when you arrive at Short Stay.  Do not shave (including legs and underarms) for at least 48 hours prior to the first CHG shower.  You may shave your face.  Please follow these instructions carefully:   1.  Shower with CHG Soap the night before surgery and the  morning of Surgery.  2.  If you choose to wash your hair, wash your hair first as usual with your  normal shampoo.  3.  After you shampoo, rinse your hair and body thoroughly to remove the  Shampoo.  4.  Use CHG as you would any other liquid soap.  You can apply chg directly to the skin and wash gently with scrungie or a clean washcloth.  5.  Apply the CHG Soap to your body ONLY FROM THE NECK DOWN.    Do not use on open wounds or open sores.  Avoid contact with your eyes, ears, mouth  and genitals (private parts).  Wash genitals (private parts)   with your normal soap.  6.  Wash thoroughly, paying special attention to the area where your surgery will be performed.  7.  Thoroughly rinse your body with warm water from the neck down.  8.  DO NOT shower/wash with your normal soap after using and rinsing off   the CHG Soap.  9.  Pat yourself dry with a clean towel.            10.  Wear clean pajamas.            11.  Place clean sheets on your bed the night of your first shower and do not sleep with pets.  Day of Surgery  Do not apply any lotions/deodorants the morning of surgery.  Please wear clean clothes to the hospital/surgery center.  Please read over the following fact sheets that you were given. Pain Booklet, Coughing  and Deep Breathing, Blood Transfusion Information, Total Joint Packet, MRSA Information and Surgical Site Infection Prevention

## 2015-10-02 NOTE — Progress Notes (Signed)
Lumbar fusion 06/2015, had cardiac review prior to that surgery, cleared for surgery at that time. Pt. Denies all chest concerns. DM- is no longer an issue, states she is always below the normal,  she remarks that she eats sweets all the time & the PCP checked her BS when she was seen this week & it was ok.

## 2015-10-05 ENCOUNTER — Other Ambulatory Visit (HOSPITAL_COMMUNITY): Payer: Medicare Other

## 2015-10-05 NOTE — H&P (Signed)
TOTAL KNEE ADMISSION H&P  Patient is being admitted for left total knee arthroplasty.  Subjective:  Chief Complaint:left knee pain.  HPI: [name] [name], 82 y.o. female, has a history of pain and functional disability in the left knee due to arthritis and has failed non-surgical conservative treatments for greater than 12 weeks to includeNSAID's and/or analgesics, corticosteriod injections, viscosupplementation injections, flexibility and strengthening excercises, use of assistive devices, weight reduction as appropriate and activity modification.  Onset of symptoms was gradual, starting 5 years ago with gradually worsening course since that time. The patient noted prior procedures on the knee to include  arthroscopy on the left knee(s).  Patient currently rates pain in the left knee(s) at 10 out of 10 with activity. Patient has night pain, worsening of pain with activity and weight bearing, pain that interferes with activities of daily living, crepitus and joint swelling.  Patient has evidence of subchondral cysts, subchondral sclerosis, periarticular osteophytes and joint space narrowing by imaging studies. There is no active infection.  Patient Active Problem List   Diagnosis Date Noted  . Radiculopathy 07/08/2015  . 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 Preoperative clearance 03/20/2013  . DIASTOLIC HEART FAILURE, CHRONIC 07/03/2009  . Aortic valve disorder 06/17/2009  . UNSPECIFIED HYPOTHYROIDISM 07/28/2008  . Hyperlipidemia 07/28/2008  . Essential hypertension 07/28/2008  . CAD, NATIVE VESSEL 07/28/2008   Past Medical History  Diagnosis Date  . Hypercholesterolemia   . CAD (coronary artery disease)     The patient had a left heart catheterization in August 2006 showed an EF of 70%, a 70% mid LAD lesion and 80% distal LAD ledsion, as well as 50% ostial first diagonal lesion.  The patient did have a drug-eluting stent placed in her mid LAD at that time.    . Diastolic heart  failure     Echo 11/10 with EF 55-60%, moderate diastolic dysfunction, no regional WMAs, mild to moderate TR, moderate LAE, mild AI  . Hypertension   . Hypothyroidism   . Bronchitis     hx of  . Mental disorder   . Depression   . Frequent urination at night   . Heart murmur     "small"  . Anginal pain ([place])   . Type II diabetes mellitus ([place])     "controlled w/diet and exercise"  . GERD (gastroesophageal reflux disease)     "gone since gallbladder took out"  . Anxiety   . Family history of adverse reaction to anesthesia   . Sleep disturbance     sleep study done- told that there wasn't any apnea concerns, states she is up & down for use of bathroom several times per night   . Arthritis     "little in my thumbs"back & knees   . Breast cancer ([place])     R breast, treated with mastectomy/tomoxifen in early 2000s    Past Surgical History  Procedure Laterality Date  . Adenosine myoview      9/09: EF 69% with normal perfusion images suggesting no ischemia or infarction.    Marland Kitchen Ulnar nerve repair      left arm  . Plantar fascia release  1990's    left  . Colonoscopy    . Pars plana vitrectomy  04/03/2012    right  . Tonsillectomy and adenoidectomy  1972  . Appendectomy  1954  . Cholecystectomy  ~ 2004  . Abdominal hysterectomy  1964  . Dilation and curettage of uterus  1960's    "I  had 3"  . Neuroplasty / transposition median nerve at carpal tunnel bilateral  1990's  . Cataract extraction w/ intraocular lens  implant, bilateral  ~ 2000  . Mastectomy  2001    right breast  . Breast biopsy  2001    right  . Cardiac catheterization    . Coronary angioplasty    . Coronary angioplasty with stent placement  2000's    "1"  . Pars plana vitrectomy  04/03/2012    Procedure: PARS PLANA VITRECTOMY WITH 25 GAUGE;  Surgeon: Hayden Pedro, MD;  Location: [facility];  Service: Ophthalmology;  Laterality: Right;  Silicone Oil, Perfluron, Repair Complex Traction Retinal Detachment  . 25 gauge  pars plana vitrectomy with 20 gauge mvr port Right 06/04/2013    Procedure: 25 GAUGE PARS PLANA VITRECTOMY WITH 20 GAUGE MVR PORT / REMOVAL SILICONE OIL RIGHT EYE. With Endolaser;  Surgeon: Hayden Pedro, MD;  Location: [facility];  Service: Ophthalmology;  Laterality: Right;  . Membrane peel Right 06/04/2013    Procedure: MEMBRANE PEEL;  Surgeon: Hayden Pedro, MD;  Location: [facility];  Service: Ophthalmology;  Laterality: Right;  . Gas/fluid exchange Right 06/04/2013    Procedure: GAS/FLUID EXCHANGE;  Surgeon: Hayden Pedro, MD;  Location: [facility];  Service: Ophthalmology;  Laterality: Right;  . Eye surgery      /iol    following cataract removal  . Back surgery  06/2015    fusion-lumbar    No prescriptions prior to admission   No Known Allergies  Social History  Substance Use Topics  . Smoking status: Never Smoker   . Smokeless tobacco: Never Used  . Alcohol Use: No    No family history on file.   Review of Systems  Musculoskeletal: Positive for joint pain.       Left knee  All other systems reviewed and are negative.   Objective:  Physical Exam  Constitutional: She is oriented to person, place, and time. She appears well-developed and well-nourished.  HENT:  Head: Normocephalic and atraumatic.  Eyes: Pupils are equal, round, and reactive to light.  Neck: Normal range of motion.  Cardiovascular: Normal rate and regular rhythm.   Respiratory: Effort normal.  GI: Soft.  Musculoskeletal:  Left knee motion is 0-105. There is no effusion. She has some healed scope portals and a lateral 2"scar as well. She has pain along the medial joint line and lateral joint line. There is crepitation towards full extension. Hip motion is full and straight leg raise is negative. She has palpable pulses in her feet and intact sensation and motor function on both sides.   Neurological: She is alert and oriented to person, place, and time.  Skin: Skin is warm and dry.  Psychiatric: She has a  normal mood and affect. Her behavior is normal. Judgment and thought content normal.    Vital signs in last 24 hours:    Labs:   Estimated body mass index is 29.77 kg/(m^2) as calculated from the following:   Height as of 07/30/15: 5\' 3"  (1.6 m).   Weight as of 07/30/15: 76.204 kg (168 lb).   Imaging Review Plain radiographs demonstrate severe degenerative joint disease of the left knee(s). The overall alignment isneutral. The bone quality appears to be good for age and reported activity level.  Assessment/Plan:  End stage Primary arthritis, left knee   The patient history, physical examination, clinical judgment of the provider and imaging studies are consistent with end stage degenerative joint disease  of the left knee(s) and total knee arthroplasty is deemed medically necessary. The treatment options including medical management, injection therapy arthroscopy and arthroplasty were discussed at length. The risks and benefits of total knee arthroplasty were presented and reviewed. The risks due to aseptic loosening, infection, stiffness, patella tracking problems, thromboembolic complications and other imponderables were discussed. The patient acknowledged the explanation, agreed to proceed with the plan and consent was signed. Patient is being admitted for inpatient treatment for surgery, pain control, PT, OT, prophylactic antibiotics, VTE prophylaxis, progressive ambulation and ADL's and discharge planning. The patient is planning to be discharged home with home health services

## 2015-10-06 NOTE — Progress Notes (Signed)
Anesthesia Chart Review:  Pt is an 82 year old female scheduled for L total knee arthroplasty on 10/13/2015 with Dr. Rhona Raider.   Cardiologist is Dr. Loralie Champagne; last office visit 06/16/14 with Ermalinda Barrios, PA. PCP is Lita Mains, NP (care everywhere).   PMH includes: CAD (DES to LAD 123456), diastolic heart failure, HTN, hyperlipidemia, hypothyroidism, heart murmur, DM, breast cancer, GERD. Never smoker. BMI 30. S/p lumbar fusion 07/08/15. S/p R pars plana vitrectomy 06/04/13 and 04/03/12.   Medications include: ASA, vytorin, hctz, imdur, levothyroxine, losartan, metoprolol.  Preoperative labs reviewed. HgbA1c was 5.5 10/01/15 (care everywhere).   Chest x-ray 06/25/15 reviewed. No change in hyperaeration and mild cardiomegaly. No active lung disease.   EKG 07/30/15: NSR.   Echo 03/22/13:  - Left ventricle: The cavity size was normal. Wall thickness was normal. Systolic function was normal. The estimated ejection fraction was in the range of 60% to 65%. Wall motion was normal; there were no regional wall motion abnormalities. Features are consistent with a pseudonormal left ventricular filling pattern, with concomitant abnormal relaxation and increased filling pressure (grade 2 diastolic dysfunction). Doppler parameters are consistent with high ventricular filling pressure. - Aortic valve: Mild regurgitation. - Mitral valve: Severely calcified annulus. - Left atrium: The atrium was mildly dilated. - Pulmonary arteries: Systolic pressure was mildly increased. PA peak pressure: 43mm Hg (S).  Cardiac cath 06/26/09:  - LAD stent is patent with minimal in-stent restenosis - 50% mid LAD stenosis of the site of the takeoff of the 3rd diagonal - Stable compared with prior cath in 2006  Pt tolerated lumbar fusion 07/08/15 without issue. If no changes, I anticipate pt can proceed with surgery as scheduled.   Willeen Cass, FNP-BC [facility]/Anesthesiology Phone:  [phone] 10/06/2015 1:51 PM

## 2015-10-12 MED ORDER — CHLORHEXIDINE GLUCONATE 4 % EX LIQD: 60.0000 mL | Freq: Once | CUTANEOUS | Status: DC

## 2015-10-12 MED ORDER — LACTATED RINGERS IV SOLN
INTRAVENOUS | Status: DC
  Administered 2015-10-13 (×2): via INTRAVENOUS

## 2015-10-12 MED ORDER — CEFAZOLIN SODIUM-DEXTROSE 2-3 GM-% IV SOLR
2.0000 g | INTRAVENOUS | Status: AC
Start: 2015-10-13 — End: 2015-10-13
  Administered 2015-10-13: 2 g via INTRAVENOUS
  Filled 2015-10-12: qty 50

## 2015-10-13 ENCOUNTER — Encounter (HOSPITAL_COMMUNITY): Payer: Self-pay | Admitting: Anesthesiology

## 2015-10-13 ENCOUNTER — Encounter (HOSPITAL_COMMUNITY)
Admission: RE | Disposition: A | Discharge status: Home Health | Payer: Self-pay | Source: Ambulatory Visit | Attending: Orthopaedic Surgery

## 2015-10-13 ENCOUNTER — Inpatient Hospital Stay (HOSPITAL_COMMUNITY): Payer: Medicare Other | Admitting: Emergency Medicine

## 2015-10-13 ENCOUNTER — Inpatient Hospital Stay (HOSPITAL_COMMUNITY): Payer: Medicare Other | Admitting: Anesthesiology

## 2015-10-13 ENCOUNTER — Inpatient Hospital Stay (HOSPITAL_COMMUNITY)
Admission: RE | Admit: 2015-10-13 | Discharge: 2015-10-15 | DRG: 470 | Disposition: A | Discharge status: Home Health | Payer: Medicare Other | Source: Ambulatory Visit | Attending: Orthopaedic Surgery | Admitting: Orthopaedic Surgery

## 2015-10-13 DIAGNOSIS — E7800 Pure hypercholesterolemia, unspecified: Secondary | ICD-10-CM | POA: Diagnosis present

## 2015-10-13 DIAGNOSIS — I10 Essential (primary) hypertension: Secondary | ICD-10-CM | POA: Diagnosis present

## 2015-10-13 DIAGNOSIS — I25119 Atherosclerotic heart disease of native coronary artery with unspecified angina pectoris: Secondary | ICD-10-CM | POA: Diagnosis present

## 2015-10-13 DIAGNOSIS — K219 Gastro-esophageal reflux disease without esophagitis: Secondary | ICD-10-CM | POA: Diagnosis present

## 2015-10-13 DIAGNOSIS — F419 Anxiety disorder, unspecified: Secondary | ICD-10-CM | POA: Diagnosis present

## 2015-10-13 DIAGNOSIS — E119 Type 2 diabetes mellitus without complications: Secondary | ICD-10-CM | POA: Diagnosis present

## 2015-10-13 DIAGNOSIS — I359 Nonrheumatic aortic valve disorder, unspecified: Secondary | ICD-10-CM | POA: Diagnosis present

## 2015-10-13 DIAGNOSIS — M25562 Pain in left knee: Secondary | ICD-10-CM | POA: Diagnosis present

## 2015-10-13 DIAGNOSIS — I5032 Chronic diastolic (congestive) heart failure: Secondary | ICD-10-CM | POA: Diagnosis present

## 2015-10-13 DIAGNOSIS — F329 Major depressive disorder, single episode, unspecified: Secondary | ICD-10-CM | POA: Diagnosis present

## 2015-10-13 DIAGNOSIS — Z955 Presence of coronary angioplasty implant and graft: Secondary | ICD-10-CM

## 2015-10-13 DIAGNOSIS — E785 Hyperlipidemia, unspecified: Secondary | ICD-10-CM | POA: Diagnosis present

## 2015-10-13 DIAGNOSIS — M1712 Unilateral primary osteoarthritis, left knee: Secondary | ICD-10-CM | POA: Diagnosis present

## 2015-10-13 DIAGNOSIS — E039 Hypothyroidism, unspecified: Secondary | ICD-10-CM | POA: Diagnosis present

## 2015-10-13 DIAGNOSIS — Z981 Arthrodesis status: Secondary | ICD-10-CM

## 2015-10-13 DIAGNOSIS — Z853 Personal history of malignant neoplasm of breast: Secondary | ICD-10-CM | POA: Diagnosis not present

## 2015-10-13 HISTORY — PX: TOTAL KNEE ARTHROPLASTY: SHX125

## 2015-10-13 LAB — GLUCOSE, CAPILLARY
Glucose-Capillary: 92 mg/dL (ref 65–99)
Glucose-Capillary: 90 mg/dL (ref 65–99)

## 2015-10-13 SURGERY — ARTHROPLASTY, KNEE, TOTAL
Anesthesia: General | Laterality: Left

## 2015-10-13 MED ORDER — METOCLOPRAMIDE HCL 5 MG/ML IJ SOLN: 5.0000 mg | Freq: Three times a day (TID) | INTRAMUSCULAR | Status: DC | PRN

## 2015-10-13 MED ORDER — ONDANSETRON HCL 4 MG/2ML IJ SOLN: 4.0000 mg | Freq: Four times a day (QID) | INTRAMUSCULAR | Status: DC | PRN

## 2015-10-13 MED ORDER — DIPHENHYDRAMINE HCL 50 MG/ML IJ SOLN
12.5000 mg | Freq: Once | INTRAMUSCULAR | Status: AC
  Administered 2015-10-13: 12.5 mg via INTRAVENOUS

## 2015-10-13 MED ORDER — ACETAMINOPHEN 325 MG PO TABS: 650.0000 mg | ORAL_TABLET | Freq: Four times a day (QID) | ORAL | Status: DC | PRN

## 2015-10-13 MED ORDER — ONDANSETRON HCL 4 MG/2ML IJ SOLN
INTRAMUSCULAR | Status: AC
  Filled 2015-10-13: qty 2

## 2015-10-13 MED ORDER — ONDANSETRON HCL 4 MG/2ML IJ SOLN: 4.0000 mg | Freq: Once | INTRAMUSCULAR | Status: DC | PRN

## 2015-10-13 MED ORDER — ISOSORBIDE MONONITRATE ER 60 MG PO TB24
120.0000 mg | ORAL_TABLET | Freq: Every day | ORAL | Status: DC
  Administered 2015-10-14 – 2015-10-15 (×2): 120 mg via ORAL
  Filled 2015-10-13 (×2): qty 2

## 2015-10-13 MED ORDER — LACTATED RINGERS IV SOLN: INTRAVENOUS | Status: DC

## 2015-10-13 MED ORDER — HYDROCODONE-ACETAMINOPHEN 5-325 MG PO TABS
1.0000 | ORAL_TABLET | ORAL | Status: DC | PRN
  Administered 2015-10-13 – 2015-10-15 (×7): 2 via ORAL
  Filled 2015-10-13 (×6): qty 2

## 2015-10-13 MED ORDER — METHOCARBAMOL 500 MG PO TABS
ORAL_TABLET | ORAL | Status: AC
  Filled 2015-10-13: qty 1

## 2015-10-13 MED ORDER — TRANEXAMIC ACID 1000 MG/10ML IV SOLN
2000.0000 mg | INTRAVENOUS | Status: DC | PRN
  Administered 2015-10-13: 2000 mg via INTRAVENOUS

## 2015-10-13 MED ORDER — EPHEDRINE SULFATE 50 MG/ML IJ SOLN
INTRAMUSCULAR | Status: AC
  Filled 2015-10-13: qty 1

## 2015-10-13 MED ORDER — PROPOFOL 10 MG/ML IV BOLUS
INTRAVENOUS | Status: AC
  Filled 2015-10-13: qty 20

## 2015-10-13 MED ORDER — ACETAMINOPHEN 650 MG RE SUPP: 650.0000 mg | Freq: Four times a day (QID) | RECTAL | Status: DC | PRN

## 2015-10-13 MED ORDER — GLYCOPYRROLATE 0.2 MG/ML IJ SOLN
INTRAMUSCULAR | Status: AC
  Filled 2015-10-13: qty 2

## 2015-10-13 MED ORDER — HYDROMORPHONE HCL 1 MG/ML IJ SOLN
0.5000 mg | INTRAMUSCULAR | Status: DC | PRN
  Administered 2015-10-13: 0.5 mg via INTRAVENOUS
  Filled 2015-10-13: qty 1

## 2015-10-13 MED ORDER — DIPHENHYDRAMINE HCL 12.5 MG/5ML PO ELIX: 12.5000 mg | ORAL_SOLUTION | ORAL | Status: DC | PRN

## 2015-10-13 MED ORDER — ALUM & MAG HYDROXIDE-SIMETH 200-200-20 MG/5ML PO SUSP: 30.0000 mL | ORAL | Status: DC | PRN

## 2015-10-13 MED ORDER — TRAZODONE HCL 50 MG PO TABS
150.0000 mg | ORAL_TABLET | Freq: Every day | ORAL | Status: DC
  Administered 2015-10-13 – 2015-10-14 (×2): 150 mg via ORAL
  Filled 2015-10-13 (×2): qty 1

## 2015-10-13 MED ORDER — TRANEXAMIC ACID 1000 MG/10ML IV SOLN
1000.0000 mg | INTRAVENOUS | Status: DC
  Filled 2015-10-13: qty 10

## 2015-10-13 MED ORDER — GLYCOPYRROLATE 0.2 MG/ML IJ SOLN
INTRAMUSCULAR | Status: DC | PRN
  Administered 2015-10-13: 0.6 mg via INTRAVENOUS
  Administered 2015-10-13: 0.2 mg via INTRAVENOUS

## 2015-10-13 MED ORDER — FENTANYL CITRATE (PF) 250 MCG/5ML IJ SOLN
INTRAMUSCULAR | Status: AC
  Filled 2015-10-13: qty 5

## 2015-10-13 MED ORDER — TRANEXAMIC ACID 1000 MG/10ML IV SOLN
2000.0000 mg | INTRAVENOUS | Status: DC
  Filled 2015-10-13: qty 20

## 2015-10-13 MED ORDER — LACTATED RINGERS IV SOLN
INTRAVENOUS | Status: DC
Start: 2015-10-13 — End: 2015-10-13

## 2015-10-13 MED ORDER — ROCURONIUM BROMIDE 50 MG/5ML IV SOLN
INTRAVENOUS | Status: AC
  Filled 2015-10-13: qty 1

## 2015-10-13 MED ORDER — METOCLOPRAMIDE HCL 5 MG PO TABS: 5.0000 mg | ORAL_TABLET | Freq: Three times a day (TID) | ORAL | Status: DC | PRN

## 2015-10-13 MED ORDER — MENTHOL 3 MG MT LOZG: 1.0000 | LOZENGE | OROMUCOSAL | Status: DC | PRN

## 2015-10-13 MED ORDER — MEPERIDINE HCL 25 MG/ML IJ SOLN: 6.2500 mg | INTRAMUSCULAR | Status: DC | PRN

## 2015-10-13 MED ORDER — HYDROCODONE-ACETAMINOPHEN 5-325 MG PO TABS
ORAL_TABLET | ORAL | Status: AC
  Filled 2015-10-13: qty 2

## 2015-10-13 MED ORDER — LIDOCAINE HCL (CARDIAC) 20 MG/ML IV SOLN
INTRAVENOUS | Status: AC
  Filled 2015-10-13: qty 5

## 2015-10-13 MED ORDER — ROCURONIUM BROMIDE 100 MG/10ML IV SOLN
INTRAVENOUS | Status: DC | PRN
  Administered 2015-10-13: 40 mg via INTRAVENOUS

## 2015-10-13 MED ORDER — NEOSTIGMINE METHYLSULFATE 10 MG/10ML IV SOLN
INTRAVENOUS | Status: AC
  Filled 2015-10-13: qty 1

## 2015-10-13 MED ORDER — DOCUSATE SODIUM 100 MG PO CAPS
100.0000 mg | ORAL_CAPSULE | Freq: Two times a day (BID) | ORAL | Status: DC
  Administered 2015-10-13 – 2015-10-15 (×5): 100 mg via ORAL
  Filled 2015-10-13 (×5): qty 1

## 2015-10-13 MED ORDER — PHENOL 1.4 % MT LIQD: 1.0000 | OROMUCOSAL | Status: DC | PRN

## 2015-10-13 MED ORDER — METOPROLOL SUCCINATE ER 50 MG PO TB24
50.0000 mg | ORAL_TABLET | Freq: Every day | ORAL | Status: DC
  Administered 2015-10-14 – 2015-10-15 (×2): 50 mg via ORAL
  Filled 2015-10-13 (×2): qty 1

## 2015-10-13 MED ORDER — FENTANYL CITRATE (PF) 100 MCG/2ML IJ SOLN
INTRAMUSCULAR | Status: AC
  Filled 2015-10-13: qty 2

## 2015-10-13 MED ORDER — DIPHENHYDRAMINE HCL 50 MG/ML IJ SOLN
INTRAMUSCULAR | Status: AC
  Filled 2015-10-13: qty 1

## 2015-10-13 MED ORDER — SODIUM CHLORIDE 0.9 % IR SOLN
Status: DC | PRN
  Administered 2015-10-13: 1000 mL

## 2015-10-13 MED ORDER — BUPIVACAINE LIPOSOME 1.3 % IJ SUSP
20.0000 mL | INTRAMUSCULAR | Status: AC
  Administered 2015-10-13: 20 mL
  Filled 2015-10-13: qty 20

## 2015-10-13 MED ORDER — LIDOCAINE HCL (CARDIAC) 20 MG/ML IV SOLN
INTRAVENOUS | Status: DC | PRN
  Administered 2015-10-13: 50 mg via INTRAVENOUS

## 2015-10-13 MED ORDER — ASPIRIN EC 325 MG PO TBEC
325.0000 mg | DELAYED_RELEASE_TABLET | Freq: Two times a day (BID) | ORAL | Status: DC
  Administered 2015-10-14 – 2015-10-15 (×3): 325 mg via ORAL
  Filled 2015-10-13 (×3): qty 1

## 2015-10-13 MED ORDER — METHOCARBAMOL 500 MG PO TABS
500.0000 mg | ORAL_TABLET | Freq: Four times a day (QID) | ORAL | Status: DC | PRN
  Administered 2015-10-13 – 2015-10-15 (×5): 500 mg via ORAL
  Filled 2015-10-13 (×4): qty 1

## 2015-10-13 MED ORDER — FENTANYL CITRATE (PF) 100 MCG/2ML IJ SOLN
INTRAMUSCULAR | Status: AC
  Administered 2015-10-13: 50 ug via INTRAVENOUS
  Filled 2015-10-13: qty 2

## 2015-10-13 MED ORDER — EZETIMIBE-SIMVASTATIN 10-40 MG PO TABS
1.0000 | ORAL_TABLET | Freq: Every day | ORAL | Status: DC
  Administered 2015-10-13 – 2015-10-14 (×2): 1 via ORAL
  Filled 2015-10-13 (×3): qty 1

## 2015-10-13 MED ORDER — TRANEXAMIC ACID 1000 MG/10ML IV SOLN
1000.0000 mg | INTRAVENOUS | Status: DC | PRN
  Administered 2015-10-13: 1000 mg via INTRAVENOUS

## 2015-10-13 MED ORDER — BUPIVACAINE-EPINEPHRINE (PF) 0.5% -1:200000 IJ SOLN
INTRAMUSCULAR | Status: AC
  Filled 2015-10-13: qty 30

## 2015-10-13 MED ORDER — 0.9 % SODIUM CHLORIDE (POUR BTL) OPTIME
TOPICAL | Status: DC | PRN
  Administered 2015-10-13: 1000 mL

## 2015-10-13 MED ORDER — FENTANYL CITRATE (PF) 100 MCG/2ML IJ SOLN
INTRAMUSCULAR | Status: DC | PRN
  Administered 2015-10-13 (×3): 50 ug via INTRAVENOUS
  Administered 2015-10-13: 100 ug via INTRAVENOUS
  Administered 2015-10-13: 50 ug via INTRAVENOUS

## 2015-10-13 MED ORDER — BUPIVACAINE-EPINEPHRINE (PF) 0.5% -1:200000 IJ SOLN
INTRAMUSCULAR | Status: DC | PRN
  Administered 2015-10-13: 20 mL via PERINEURAL

## 2015-10-13 MED ORDER — NEOSTIGMINE METHYLSULFATE 10 MG/10ML IV SOLN
INTRAVENOUS | Status: DC | PRN
  Administered 2015-10-13: 1 mg via INTRAVENOUS
  Administered 2015-10-13: 3 mg via INTRAVENOUS

## 2015-10-13 MED ORDER — HYDROCHLOROTHIAZIDE 12.5 MG PO CAPS
12.5000 mg | ORAL_CAPSULE | Freq: Every day | ORAL | Status: DC
  Administered 2015-10-13 – 2015-10-15 (×3): 12.5 mg via ORAL
  Filled 2015-10-13 (×3): qty 1

## 2015-10-13 MED ORDER — VENLAFAXINE HCL ER 150 MG PO CP24
150.0000 mg | ORAL_CAPSULE | Freq: Two times a day (BID) | ORAL | Status: DC
  Administered 2015-10-13 – 2015-10-15 (×4): 150 mg via ORAL
  Filled 2015-10-13 (×4): qty 1

## 2015-10-13 MED ORDER — ONDANSETRON HCL 4 MG PO TABS: 4.0000 mg | ORAL_TABLET | Freq: Four times a day (QID) | ORAL | Status: DC | PRN

## 2015-10-13 MED ORDER — LOSARTAN POTASSIUM 50 MG PO TABS
100.0000 mg | ORAL_TABLET | Freq: Every day | ORAL | Status: DC
  Administered 2015-10-13 – 2015-10-15 (×3): 100 mg via ORAL
  Filled 2015-10-13 (×3): qty 2

## 2015-10-13 MED ORDER — BISACODYL 5 MG PO TBEC
5.0000 mg | DELAYED_RELEASE_TABLET | Freq: Every day | ORAL | Status: DC | PRN
  Administered 2015-10-14: 5 mg via ORAL
  Filled 2015-10-13: qty 1

## 2015-10-13 MED ORDER — CEFAZOLIN SODIUM-DEXTROSE 2-3 GM-% IV SOLR
2.0000 g | Freq: Four times a day (QID) | INTRAVENOUS | Status: AC
  Administered 2015-10-13 (×2): 2 g via INTRAVENOUS
  Filled 2015-10-13 (×3): qty 50

## 2015-10-13 MED ORDER — ONDANSETRON HCL 4 MG/2ML IJ SOLN
INTRAMUSCULAR | Status: DC | PRN
  Administered 2015-10-13: 4 mg via INTRAVENOUS

## 2015-10-13 MED ORDER — LEVOTHYROXINE SODIUM 75 MCG PO TABS
75.0000 ug | ORAL_TABLET | Freq: Every day | ORAL | Status: DC
  Administered 2015-10-14 – 2015-10-15 (×2): 75 ug via ORAL
  Filled 2015-10-13 (×2): qty 1

## 2015-10-13 MED ORDER — FENTANYL CITRATE (PF) 100 MCG/2ML IJ SOLN
25.0000 ug | INTRAMUSCULAR | Status: DC | PRN
  Administered 2015-10-13 (×2): 50 ug via INTRAVENOUS
  Administered 2015-10-13 (×2): 25 ug via INTRAVENOUS

## 2015-10-13 MED ORDER — METHOCARBAMOL 1000 MG/10ML IJ SOLN
500.0000 mg | Freq: Four times a day (QID) | INTRAMUSCULAR | Status: DC | PRN
Start: 2015-10-13 — End: 2015-10-15
  Filled 2015-10-13: qty 5

## 2015-10-13 MED ORDER — PROPOFOL 10 MG/ML IV BOLUS
INTRAVENOUS | Status: DC | PRN
  Administered 2015-10-13: 50 mg via INTRAVENOUS
  Administered 2015-10-13: 100 mg via INTRAVENOUS

## 2015-10-13 MED ORDER — DEXAMETHASONE SODIUM PHOSPHATE 10 MG/ML IJ SOLN
INTRAMUSCULAR | Status: DC | PRN
Start: 2015-10-13 — End: 2015-10-13
  Administered 2015-10-13: 10 mg via INTRAVENOUS

## 2015-10-13 MED ORDER — SUCCINYLCHOLINE CHLORIDE 20 MG/ML IJ SOLN
INTRAMUSCULAR | Status: AC
  Filled 2015-10-13: qty 1

## 2015-10-13 MED ORDER — GLYCOPYRROLATE 0.2 MG/ML IJ SOLN
INTRAMUSCULAR | Status: AC
  Filled 2015-10-13: qty 1

## 2015-10-13 SURGICAL SUPPLY — 64 items
BAG DECANTER FOR FLEXI CONT (MISCELLANEOUS) IMPLANT
BANDAGE ELASTIC 4 VELCRO ST LF (GAUZE/BANDAGES/DRESSINGS) ×3 IMPLANT
BANDAGE ESMARK 6X9 LF (GAUZE/BANDAGES/DRESSINGS) ×1 IMPLANT
BLADE SAGITTAL 25.0X1.19X90 (BLADE) IMPLANT
BLADE SAGITTAL 25.0X1.19X90MM (BLADE)
BLADE SAW SGTL 13.0X1.19X90.0M (BLADE) IMPLANT
BLADE SURG ROTATE 9660 (MISCELLANEOUS) IMPLANT
BNDG ELASTIC 6X10 VLCR STRL LF (GAUZE/BANDAGES/DRESSINGS) ×3 IMPLANT
BNDG ESMARK 6X9 LF (GAUZE/BANDAGES/DRESSINGS) ×3
BNDG GAUZE ELAST 4 BULKY (GAUZE/BANDAGES/DRESSINGS) ×3 IMPLANT
BOWL SMART MIX CTS (DISPOSABLE) ×3 IMPLANT
CAP KNEE TOTAL 3 SIGMA ×3 IMPLANT
CEMENT HV SMART SET (Cement) ×6 IMPLANT
CLOSURE WOUND 1/2 X4 (GAUZE/BANDAGES/DRESSINGS) ×1
COVER SURGICAL LIGHT HANDLE (MISCELLANEOUS) ×3 IMPLANT
CUFF TOURNIQUET SINGLE 34IN LL (TOURNIQUET CUFF) ×3 IMPLANT
DECANTER SPIKE VIAL GLASS SM (MISCELLANEOUS) ×3 IMPLANT
DRAPE EXTREMITY T 121X128X90 (DRAPE) ×3 IMPLANT
DRAPE PROXIMA HALF (DRAPES) ×3 IMPLANT
DRAPE U-SHAPE 47X51 STRL (DRAPES) ×3 IMPLANT
DRSG ADAPTIC 3X8 NADH LF (GAUZE/BANDAGES/DRESSINGS) ×3 IMPLANT
DRSG PAD ABDOMINAL 8X10 ST (GAUZE/BANDAGES/DRESSINGS) ×3 IMPLANT
DURAPREP 26ML APPLICATOR (WOUND CARE) ×3 IMPLANT
ELECT REM PT RETURN 9FT ADLT (ELECTROSURGICAL) ×3
ELECTRODE REM PT RTRN 9FT ADLT (ELECTROSURGICAL) ×1 IMPLANT
GAUZE SPONGE 4X4 12PLY STRL (GAUZE/BANDAGES/DRESSINGS) ×3 IMPLANT
GLOVE BIO SURGEON STRL SZ8 (GLOVE) ×6 IMPLANT
GLOVE BIOGEL PI IND STRL 7.5 (GLOVE) ×1 IMPLANT
GLOVE BIOGEL PI IND STRL 8 (GLOVE) ×3 IMPLANT
GLOVE BIOGEL PI INDICATOR 7.5 (GLOVE) ×2
GLOVE BIOGEL PI INDICATOR 8 (GLOVE) ×6
GLOVE SURG SS PI 7.0 STRL IVOR (GLOVE) ×3 IMPLANT
GLOVE SURG SS PI 7.5 STRL IVOR (GLOVE) ×3 IMPLANT
GOWN STRL REUS W/ TWL LRG LVL3 (GOWN DISPOSABLE) ×1 IMPLANT
GOWN STRL REUS W/ TWL XL LVL3 (GOWN DISPOSABLE) ×3 IMPLANT
GOWN STRL REUS W/TWL LRG LVL3 (GOWN DISPOSABLE) ×2
GOWN STRL REUS W/TWL XL LVL3 (GOWN DISPOSABLE) ×6
HANDPIECE INTERPULSE COAX TIP (DISPOSABLE) ×2
HOOD PEEL AWAY FACE SHEILD DIS (HOOD) ×6 IMPLANT
IMMOBILIZER KNEE 22 UNIV (SOFTGOODS) ×3 IMPLANT
KIT BASIN OR (CUSTOM PROCEDURE TRAY) ×3 IMPLANT
KIT ROOM TURNOVER OR (KITS) ×3 IMPLANT
MANIFOLD NEPTUNE II (INSTRUMENTS) ×3 IMPLANT
NEEDLE HYPO 21X1 ECLIPSE (NEEDLE) ×3 IMPLANT
NS IRRIG 1000ML POUR BTL (IV SOLUTION) ×3 IMPLANT
PACK TOTAL JOINT (CUSTOM PROCEDURE TRAY) ×3 IMPLANT
PACK UNIVERSAL I (CUSTOM PROCEDURE TRAY) ×3 IMPLANT
PAD ARMBOARD 7.5X6 YLW CONV (MISCELLANEOUS) ×6 IMPLANT
SET HNDPC FAN SPRY TIP SCT (DISPOSABLE) ×1 IMPLANT
STAPLER VISISTAT 35W (STAPLE) IMPLANT
STRIP CLOSURE SKIN 1/2X4 (GAUZE/BANDAGES/DRESSINGS) ×2 IMPLANT
SUCTION FRAZIER HANDLE 10FR (MISCELLANEOUS)
SUCTION TUBE FRAZIER 10FR DISP (MISCELLANEOUS) IMPLANT
SUT MNCRL AB 3-0 PS2 18 (SUTURE) IMPLANT
SUT VIC AB 0 CT1 27 (SUTURE) ×4
SUT VIC AB 0 CT1 27XBRD ANBCTR (SUTURE) ×2 IMPLANT
SUT VIC AB 2-0 CT1 27 (SUTURE) ×4
SUT VIC AB 2-0 CT1 TAPERPNT 27 (SUTURE) ×2 IMPLANT
SUT VLOC 180 0 24IN GS25 (SUTURE) ×3 IMPLANT
SYR 50ML LL SCALE MARK (SYRINGE) ×3 IMPLANT
TOWEL OR 17X24 6PK STRL BLUE (TOWEL DISPOSABLE) ×3 IMPLANT
TOWEL OR 17X26 10 PK STRL BLUE (TOWEL DISPOSABLE) ×3 IMPLANT
TRAY FOLEY CATH 14FR (SET/KITS/TRAYS/PACK) IMPLANT
WATER STERILE IRR 1000ML POUR (IV SOLUTION) ×6 IMPLANT

## 2015-10-13 NOTE — Anesthesia Postprocedure Evaluation (Signed)
Anesthesia Post Note  Patient: [name] [name]  Procedure(s) Performed: Procedure(s) (LRB): TOTAL KNEE ARTHROPLASTY (Left)  Patient location during evaluation: PACU Anesthesia Type: General Level of consciousness: awake and alert Pain management: pain level controlled Vital Signs Assessment: post-procedure vital signs reviewed and stable Respiratory status: spontaneous breathing, nonlabored ventilation, respiratory function stable and patient connected to nasal cannula oxygen Cardiovascular status: blood pressure returned to baseline and stable Postop Assessment: no signs of nausea or vomiting Anesthetic complications: no Comments: Had some pruritis in PACU, given benadryl IV.    Last Vitals:  Filed Vitals:   10/13/15 1400 10/13/15 1443  BP: 125/64 124/66  Pulse: 63 57  Temp: 36.7 C 37.1 C  Resp: 17 18    Last Pain:  Filed Vitals:   10/13/15 1444  PainSc: 3                  Prem Coykendall A.

## 2015-10-13 NOTE — Clinical Social Work Note (Signed)
CSW received referral for SNF.  Case discussed with case manager, and plan is to discharge home.  CSW to sign off please re-consult if social work needs arise.  Loy Mccartt R. Derrick Orris, MSW, LCSWA [phone]  

## 2015-10-13 NOTE — Interval H&P Note (Signed)
OK for surgery PD 

## 2015-10-13 NOTE — Transfer of Care (Signed)
Immediate Anesthesia Transfer of Care Note  Patient: [name] [name]  Procedure(s) Performed: Procedure(s): TOTAL KNEE ARTHROPLASTY (Left)  Patient Location: PACU  Anesthesia Type:General  Level of Consciousness: awake  Airway & Oxygen Therapy: Patient Spontanous Breathing and Patient connected to nasal cannula oxygen  Post-op Assessment: Report given to RN and Post -op Vital signs reviewed and stable  Post vital signs: stable  Last Vitals:  Filed Vitals:   10/13/15 0836  BP: 131/72  Pulse: 54  Temp: [id number] C    Complications: No apparent anesthesia complications

## 2015-10-13 NOTE — Progress Notes (Signed)
Utilization review completed.  

## 2015-10-13 NOTE — Anesthesia Preprocedure Evaluation (Addendum)
Anesthesia Evaluation  Patient identified by MRN, date of birth, ID band Patient awake    Reviewed: Allergy & Precautions, NPO status , Patient's Chart, lab work & pertinent test results, reviewed documented beta blocker date and time   History of Anesthesia Complications (+) Family history of anesthesia reaction  Airway Mallampati: II  TM Distance: >3 FB Neck ROM: Full    Dental  (+) Upper Dentures   Pulmonary neg pulmonary ROS,    Pulmonary exam normal breath sounds clear to auscultation       Cardiovascular hypertension, Pt. on medications and Pt. on home beta blockers + angina with exertion + CAD and + Cardiac Stents  Normal cardiovascular exam+ Valvular Problems/Murmurs AI  Rhythm:Regular Rate:Normal  LVEF 60-65% on last Echo Stent mid LAD   Neuro/Psych PSYCHIATRIC DISORDERS Anxiety Depression  Neuromuscular disease    GI/Hepatic Neg liver ROS, GERD  Controlled and Medicated,  Endo/Other  diabetes, Well Controlled, Type 2Hypothyroidism   Renal/GU negative Renal ROS  negative genitourinary   Musculoskeletal  (+) Arthritis , Osteoarthritis,    Abdominal   Peds  Hematology   Anesthesia Other Findings   Reproductive/Obstetrics                           Anesthesia Physical Anesthesia Plan  ASA: III  Anesthesia Plan: General   Post-op Pain Management:    Induction:   Airway Management Planned: Oral ETT  Additional Equipment:   Intra-op Plan:   Post-operative Plan:   Informed Consent: I have reviewed the patients History and Physical, chart, labs and discussed the procedure including the risks, benefits and alternatives for the proposed anesthesia with the patient or authorized representative who has indicated his/her understanding and acceptance.   Dental advisory given  Plan Discussed with: CRNA, Anesthesiologist and Surgeon  Anesthesia Plan Comments: (Patient declines RA.)        Anesthesia Quick Evaluation

## 2015-10-13 NOTE — Evaluation (Signed)
Physical Therapy Evaluation Patient Details Name: [name] [name] MRN: [id number] DOB: Mar 17, 1934 Today's Date: 10/13/2015   History of Present Illness  Patient is a 82 y/o female with hx of spine surgery, diastolic heart failure, frequent urination and breast ca s/p left TKA.  Clinical Impression  Patient presents with pain and post surgical deficits LLE s/p left TKA. Education on exercises and knee precautions. Tolerated short distance ambulation with Min A for balance/safety. Pt has support from children and spouse at home. Really wants to be able to return home if safely able to do so. Will follow acutely to maximize independence and mobility prior to return home.    Follow Up Recommendations Home health PT;Supervision/Assistance - 24 hour    Equipment Recommendations  None recommended by PT    Recommendations for Other Services       Precautions / Restrictions Precautions Precautions: Knee Precaution Booklet Issued: No Precaution Comments: Reviewed no pillow under knee and precautions. Restrictions Weight Bearing Restrictions: Yes LLE Weight Bearing: Weight bearing as tolerated      Mobility  Bed Mobility Overal bed mobility: Needs Assistance Bed Mobility: Supine to Sit     Supine to sit: Min guard;HOB elevated     General bed mobility comments: Increased time. No assist needed. Use of rail.   Transfers Overall transfer level: Needs assistance Equipment used: Rolling walker (2 wheeled) Transfers: Sit to/from Stand Sit to Stand: Min assist         General transfer comment: Min A to boost to standing position with cues for hand placement/technique.   Ambulation/Gait Ambulation/Gait assistance: Min assist Ambulation Distance (Feet): 25 Feet Assistive device: Rolling walker (2 wheeled) Gait Pattern/deviations: Step-to pattern;Decreased stance time - left;Decreased step length - right;Trunk flexed Gait velocity: decreased Gait velocity interpretation: <1.8  ft/sec, indicative of risk for recurrent falls General Gait Details: Increased WB through BUEs due to pain with WB through LLE.   Stairs            Wheelchair Mobility    Modified Rankin (Stroke Patients Only)       Balance Overall balance assessment: Needs assistance Sitting-balance support: Feet supported;No upper extremity supported Sitting balance-Leahy Scale: Good     Standing balance support: During functional activity;Bilateral upper extremity supported Standing balance-Leahy Scale: Poor Standing balance comment: Reliant on RW for support.                              Pertinent Vitals/Pain Pain Assessment: Faces Faces Pain Scale: Hurts a little bit Pain Location: left quad Pain Descriptors / Indicators: Aching Pain Intervention(s): Monitored during session;Repositioned;Patient requesting pain meds-RN notified    Home Living Family/patient expects to be discharged to:: Private residence Living Arrangements: Spouse/significant other Available Help at Discharge: Family;Available 24 hours/day Type of Home: House Home Access: Stairs to enter Entrance Stairs-Rails: None Entrance Stairs-Number of Steps: 1 threshold Home Layout: One level Home Equipment: Walker - 2 wheels;Bedside commode;Grab bars - tub/shower;Shower seat;Other (comment) (lift chair)      Prior Function Level of Independence: Independent               Hand Dominance        Extremity/Trunk Assessment   Upper Extremity Assessment: Defer to OT evaluation           Lower Extremity Assessment: LLE deficits/detail   LLE Deficits / Details: Limited AROM/strength secondary to pain and post op     Communication  Communication: No difficulties  Cognition Arousal/Alertness: Awake/alert Behavior During Therapy: WFL for tasks assessed/performed Overall Cognitive Status: Within Functional Limits for tasks assessed                      General Comments General  comments (skin integrity, edema, etc.): Family present in room during session.    Exercises Total Joint Exercises Ankle Circles/Pumps: Both;10 reps;Seated Quad Sets: Both;10 reps;Seated Gluteal Sets: Both;10 reps;Seated      Assessment/Plan    PT Assessment Patient needs continued PT services  PT Diagnosis Difficulty walking;Acute pain   PT Problem List Decreased strength;Pain;Decreased balance;Decreased mobility;Decreased knowledge of precautions;Decreased activity tolerance;Decreased range of motion  PT Treatment Interventions Balance training;Gait training;Functional mobility training;Therapeutic activities;Therapeutic exercise;Patient/family education;Stair training   PT Goals (Current goals can be found in the Care Plan section) Acute Rehab PT Goals Patient Stated Goal: to be able to take care of myself PT Goal Formulation: With patient Time For Goal Achievement: 10/27/15 Potential to Achieve Goals: Good    Frequency 7X/week   Barriers to discharge        Co-evaluation               End of Session Equipment Utilized During Treatment: Gait belt Activity Tolerance: Patient tolerated treatment well Patient left: in chair;with call bell/phone within reach;with family/visitor present Nurse Communication: Mobility status         Time: [id number] PT Time Calculation (min) (ACUTE ONLY): 30 min   Charges:   PT Evaluation $PT Eval Moderate Complexity: 1 Procedure PT Treatments $Therapeutic Activity: 8-22 mins   PT G Codes:        Kendle Turbin A Donie Lemelin 10/13/2015, 4:07 PM  Wray Kearns, [place], DPT [phone]

## 2015-10-13 NOTE — Op Note (Signed)
PREOP DIAGNOSIS: DJD LEFT KNEE POSTOP DIAGNOSIS:  same PROCEDURE: LEFT TKR ANESTHESIA: General ATTENDING SURGEON: Mialynn Shelvin G ASSISTANTLoni Dolly PA  INDICATIONS FOR PROCEDURE: [name] [name] is a 82 y.o. female who has struggled for a long time with pain due to degenerative arthritis of the left knee.  The patient has failed many conservative non-operative measures and at this point has pain which limits the ability to sleep and walk.  The patient is offered total knee replacement.  Informed operative consent was obtained after discussion of possible risks of anesthesia, infection, neurovascular injury, DVT, and death.  The importance of the post-operative rehabilitation protocol to optimize result was stressed extensively with the patient.  SUMMARY OF FINDINGS AND PROCEDURE:  [name] was taken to the operative suite where under the above anesthesia a left knee replacement was performed.  There were advanced degenerative changes and the bone quality was fair.  We used the DePuyLCS system and placed size standard femur, 3 tibia, 35 mm all polyethylene patella, and a size 12.5 mm spacer.  Loni Dolly PA-C assisted throughout and was invaluable to the completion of the case in that he helped retract and maintain exposure while I placed the components.  He also helped close thereby minimizing OR time.  The patient was admitted for appropriate post-op care to include perioperative antibiotics and mechanical and pharmacologic measures for DVT prophylaxis.  DESCRIPTION OF PROCEDURE:  [name] was taken to the operative suite where the above anesthesia was applied.  The patient was positioned supine and prepped and draped in normal sterile fashion.  An appropriate time out was performed.  After the administration of kefzol pre-op antibiotic the leg was elevated and exsanguinated and a tourniquet inflated.  A standard longitudinal incision was made on the anterior knee.  Dissection was carried down  to the extensor mechanism.  All appropriate anti-infective measures were used including the pre-operative antibiotic, betadine impregnated drape, and closed hooded exhaust systems for each member of the surgical team.  A medial parapatellar incision was made in the extensor mechanism and the knee cap flipped and the knee flexed.  Some residual meniscal tissues were removed along with any remaining ACL/PCL tissue.  A guide was placed on the tibia and a flat cut was made on it's superior surface.  An intramedullary guide was placed in the femur and was utilized to make anterior and posterior cuts creating an appropriate flexion gap.  A second intramedullary guide was placed in the femur to make a distal cut properly balancing the knee with an extension gap equal to the flexion gap.  The three bones sized to the above mentioned sizes and the appropriate guides were placed and utilized.  A trial reduction was done and the knee easily came to full extension and the patella tracked well on flexion.  The trial components were removed and all bones were cleaned with pulsatile lavage and then dried thoroughly.  Cement was mixed and was pressurized onto the bones followed by placement of the aforementioned components.  Excess cement was trimmed and pressure was held on the components until the cement had hardened.  The tourniquet was deflated and a small amount of bleeding was controlled with cautery and pressure.  The knee was irrigated thoroughly.  The extensor mechanism was re-approximated with V-loc suture in running fashion.  The knee was flexed and the repair was solid.  The subcutaneous tissues were re-approximated with #0 and #2-0 vicryl and the skin closed with a  subcuticular stitch and steristrips.  A sterile dressing was applied.  Intraoperative fluids, EBL, and tourniquet time can be obtained from anesthesia records.  DISPOSITION:  The patient was taken to recovery room in stable condition and admitted for  appropriate post-op care to include peri-operative antibiotic and DVT prophylaxis with mechanical and pharmacologic measures.  Iraida Cragin G 10/13/2015, 11:49 AM

## 2015-10-13 NOTE — Progress Notes (Signed)
Orthopedic Tech Progress Note Patient Details:  [name] [name] 03-15-1934 [id number]  CPM Left Knee CPM Left Knee: On Left Knee Flexion (Degrees): 90 Left Knee Extension (Degrees): 0 Additional Comments: Trapeze bar and foot roll   [name] 10/13/2015, 1:16 PM

## 2015-10-13 NOTE — Anesthesia Procedure Notes (Signed)
Procedure Name: Intubation Date/Time: 10/13/2015 10:32 AM Performed by: Rejeana Brock L Pre-anesthesia Checklist: Patient identified, Emergency Drugs available, Suction available, Patient being monitored and Timeout performed Patient Re-evaluated:Patient Re-evaluated prior to inductionOxygen Delivery Method: Circle system utilized Preoxygenation: Pre-oxygenation with 100% oxygen Intubation Type: IV induction Ventilation: Mask ventilation without difficulty Laryngoscope Size: Mac and 3 Grade View: Grade I Tube type: Oral Tube size: 7.0 mm Number of attempts: 1 Airway Equipment and Method: Stylet Placement Confirmation: ETT inserted through vocal cords under direct vision,  positive ETCO2 and breath sounds checked- equal and bilateral Secured at: 21 cm Tube secured with: Tape Dental Injury: Teeth and Oropharynx as per pre-operative assessment

## 2015-10-14 ENCOUNTER — Encounter (HOSPITAL_COMMUNITY): Payer: Self-pay | Admitting: Orthopaedic Surgery

## 2015-10-14 NOTE — Progress Notes (Signed)
Occupational Therapy Evaluation Patient Details Name: [name] [name] MRN: [id number] DOB: May 09, 1934 Today's Date: 10/14/2015    History of Present Illness Patient is a 82 y/o female with hx of spine surgery, diastolic heart failure, frequent urination and breast ca s/p left TKA.   Clinical Impression   PTA, pt independent with ADL and mobility. Pt currently requires min A with mobility and Mod A wit LB ADL. Began education regarding compensatory techniques for ADL. Pt voicing concern that she wants to be as independent as possible. Feel pt would benefit from [facility] after D/C. Will follow acutely to facilitate safe D/C home with 24/7 S initially.    Follow Up Recommendations  Supervision/Assistance - 24 hour;Home health OT    Equipment Recommendations  None recommended by OT    Recommendations for Other Services       Precautions / Restrictions Precautions Precautions: Knee Restrictions LLE Weight Bearing: Weight bearing as tolerated      Mobility Bed Mobility Overal bed mobility: Needs Assistance Bed Mobility: Supine to Sit;Sit to Supine     Supine to sit: Min assist Sit to supine: Min assist  Educated on using RLE under LLE to lift off bed     Transfers Overall transfer level: Needs assistance Equipment used: Rolling walker (2 wheeled) Transfers: Sit to/from Stand Sit to Stand: Min guard Stand pivot transfers: Min guard       General transfer comment: min guard for safety; cues for hand placement    Balance Overall balance assessment: Needs assistance   Sitting balance-Leahy Scale: Good     Standing balance support: During functional activity Standing balance-Leahy Scale: Fair                              ADL Overall ADL's : Needs assistance/impaired         Upper Body Bathing: Set up;Supervision/ safety;Sitting   Lower Body Bathing: Moderate assistance;Sit to/from stand   Upper Body Dressing : Supervision/safety;Set up;Sitting    Lower Body Dressing: Moderate assistance;Sit to/from stand   Toilet Transfer: Minimal assistance;BSC;RW   Toileting- Clothing Manipulation and Hygiene: Minimal assistance       Functional mobility during ADLs: Min guard;Rolling walker       Vision     Perception     Praxis      Pertinent Vitals/Pain Pain Assessment: Faces Pain Score: 7  Faces Pain Scale: Hurts little more Pain Location: L knee Pain Descriptors / Indicators: Sore Pain Intervention(s): Limited activity within patient's tolerance;Monitored during session;Premedicated before session;Repositioned     Hand Dominance     Extremity/Trunk Assessment Upper Extremity Assessment Upper Extremity Assessment: Overall WFL for tasks assessed   Lower Extremity Assessment Lower Extremity Assessment: Defer to PT evaluation   Cervical / Trunk Assessment Cervical / Trunk Assessment: Normal   Communication Communication Communication: No difficulties   Cognition Arousal/Alertness: Awake/alert Behavior During Therapy: WFL for tasks assessed/performed Overall Cognitive Status: Within Functional Limits for tasks assessed                     General Comments       Exercises Exercises: Total Joint     Shoulder Instructions      Home Living Family/patient expects to be discharged to:: Private residence Living Arrangements: Spouse/significant other Available Help at Discharge: Family;Available 24 hours/day Type of Home: House Home Access: Stairs to enter [employer] of Steps: 1 threshold Entrance Stairs-Rails: None Home Layout:  One level     Bathroom Shower/Tub: Tub/shower unit Shower/tub characteristics: Door Biochemist, clinical: Standard Bathroom Accessibility: Yes How Accessible: Accessible via walker Home Equipment: Walker - 2 wheels;Bedside commode;Grab bars - tub/shower;Shower seat;Other (comment) (lift chair)          Prior Functioning/Environment Level of Independence:  Independent             OT Diagnosis: Generalized weakness;Acute pain   OT Problem List: Decreased strength;Decreased activity tolerance;Decreased knowledge of use of DME or AE;Pain   OT Treatment/Interventions: Self-care/ADL training;DME and/or AE instruction;Therapeutic activities;Patient/family education    OT Goals(Current goals can be found in the care plan section) Acute Rehab OT Goals Patient Stated Goal: to be able to take care of myself OT Goal Formulation: With patient Time For Goal Achievement: 10/21/15 Potential to Achieve Goals: Good ADL Goals Pt Will Perform Lower Body Bathing: with min assist;with adaptive equipment;sit to/from stand Pt Will Perform Lower Body Dressing: with adaptive equipment;sit to/from stand;with min assist Pt Will Transfer to Toilet: with supervision;ambulating;bedside commode Pt Will Perform Toileting - Clothing Manipulation and hygiene: with modified independence;sit to/from stand  OT Frequency: Min 2X/week   Barriers to D/C:            Co-evaluation              End of Session CPM Left Knee CPM Left Knee: On Left Knee Flexion (Degrees): 90 Left Knee Extension (Degrees): 0  Activity Tolerance:   Patient left:     Time: [id number] OT Time Calculation (min): 19 min Charges:  OT General Charges $OT Visit: 1 Procedure OT Evaluation $OT Eval Moderate Complexity: 1 Procedure G-Codes:    Juliani Laduke,HILLARY 10-19-15, 3:09 PM   [place], OTR/L  [phone] 10/19/15

## 2015-10-14 NOTE — Care Management Note (Addendum)
Case Management Note  Patient Details  Name: [name] [name] MRN: [id number] Date of Birth: 07/31/1934  Subjective/Objective:       S/p left total knee arthroplasty             Action/Plan: Set up with [facility] for HHPT, [facility] and aide by [place] office. Spoke with patient, no change in discharge plan.Patient stated that her husband and her daughter will be assisting her after discharge. [employer] will deliver CPM to home, patient has rolling walker and 3N1 from previous surgery. Contacted Helene Kelp at [facility] office, they are set up for [place], [facility] and aide. Faxed HH order, face to face, H and P, op note and facesheet  To [phone] and received confirmation.   OT recommended HHOT. Contacted Teresa at [place] and added [facility] to patient's services. Faxed updated [place] order to [phone] and received confirmation.    Expected Discharge Date:                  Expected Discharge Plan:  [place]  In-House Referral:  NA  Discharge planning Services  CM Consult  Post Acute Care Choice:  Durable Medical Equipment, Home Health Choice offered to:  Patient  DME Arranged:  CPM DME Agency:  [employer]  HH Arranged:  RN, PT, Nurse's Aide [place] Agency:  [place]  Status of Service:  Completed, signed off  Medicare Important Message Given:    Date Medicare IM Given:    Medicare IM give by:    Date Additional Medicare IM Given:    Additional Medicare Important Message give by:     If discussed at [place] of Stay Meetings, dates discussed:    Additional Comments:  Nila Nephew, RN 10/14/2015, 2:28 PM

## 2015-10-14 NOTE — Progress Notes (Signed)
Orthopedic Tech Progress Note Patient Details:  [name] [name] [birth year] [id number]  Patient ID: [name], female   DOB: May 18, 1934, 82 y.o.   MRN: [id number] Pt. Already in cpm  Karolee Stamps 10/14/2015, 7:20 PM

## 2015-10-14 NOTE — Progress Notes (Signed)
Subjective: 1 Day Post-Op Procedure(s) (LRB): TOTAL KNEE ARTHROPLASTY (Left)  Activity level:  wbat Diet tolerance:  ok Voiding:  ok Patient reports pain as mild.    Objective: Vital signs in last 24 hours: Temp:  [97.4 F (36.3 C)-98.8 F (37.1 C)] 98.6 F (37 C) (03/01 0400) Pulse Rate:  [54-70] 70 (03/01 0400) Resp:  [14-22] 17 (03/01 0400) BP: (121-171)/(64-83) 145/83 mmHg (03/01 0400) SpO2:  [96 %-100 %] 98 % (03/01 0400)  Labs: No results for input(s): HGB in the last 72 hours. No results for input(s): WBC, RBC, HCT, PLT in the last 72 hours. No results for input(s): NA, K, CL, CO2, BUN, CREATININE, GLUCOSE, CALCIUM in the last 72 hours. No results for input(s): LABPT, INR in the last 72 hours.  Physical Exam:  Neurologically intact ABD soft Neurovascular intact Sensation intact distally Intact pulses distally Dorsiflexion/Plantar flexion intact Incision: dressing C/D/I and no drainage No cellulitis present Compartment soft  Assessment/Plan:  1 Day Post-Op Procedure(s) (LRB): TOTAL KNEE ARTHROPLASTY (Left) Advance diet Up with therapy D/C IV fluids Plan for discharge tomorrow Discharge home with home health if doing well and cleared by PT. I will change dressing to aquacel tomorrow. Follow up in office 2 weeks post op. Continue on ASA 325mg  BID x 2 weeks post op.  [name] [name], Larwance Sachs 10/14/2015, 7:43 AM

## 2015-10-14 NOTE — Progress Notes (Signed)
Physical Therapy Treatment Patient Details Name: [name] [name] MRN: [id number] DOB: 1934/04/16 Today's Date: 10/14/2015    History of Present Illness Patient is a 82 y/o female with hx of spine surgery, diastolic heart failure, frequent urination and breast ca s/p left TKA.    PT Comments    Patient continues to do well with PT. Limited by pain with most difficulty performing L knee flexion. Continue to progress as tolerated with anticipated d/c home with HHPT.   Follow Up Recommendations  Home health PT;Supervision/Assistance - 24 hour     Equipment Recommendations  None recommended by PT    Recommendations for Other Services       Precautions / Restrictions Precautions Precautions: Knee Restrictions LLE Weight Bearing: Weight bearing as tolerated    Mobility  Bed Mobility Overal bed mobility: Needs Assistance Bed Mobility: Supine to Sit;Sit to Supine     Supine to sit: Min guard Sit to supine: Min guard   General bed mobility comments: min guard for safety; pt using "hook" technique to elevate L LE into bed with R LE; pt able to scoot up in bed without assist   Transfers Overall transfer level: Needs assistance Equipment used: Rolling walker (2 wheeled) Transfers: Sit to/from Stand Sit to Stand: Supervision Stand pivot transfers: Min guard       General transfer comment: from [facility] and EOB; supervision for safety with cues for safe hand placement and use of AD  Ambulation/Gait Ambulation/Gait assistance: Supervision;Min guard Ambulation Distance (Feet): 150 Feet Assistive device: Rolling walker (2 wheeled) Gait Pattern/deviations: Step-through pattern;Decreased stride length;Decreased stance time - left;Decreased step length - right;Antalgic;Trunk flexed     General Gait Details: cues to increase L knee flexion during swing phase; pt able to correct with cues; cues for posture and position of RW; pt c/o sharp pain X 3 while ambulating but no c/o increased pain  post ambulation   Stairs            Wheelchair Mobility    Modified Rankin (Stroke Patients Only)       Balance Overall balance assessment: Needs assistance   Sitting balance-Leahy Scale: Good     Standing balance support: During functional activity Standing balance-Leahy Scale: Fair                      Cognition Arousal/Alertness: Awake/alert Behavior During Therapy: WFL for tasks assessed/performed Overall Cognitive Status: Within Functional Limits for tasks assessed                      Exercises Total Joint Exercises Quad Sets: AROM;Both;10 reps;Seated Short Arc Quad: AROM;AAROM;Left;10 reps;Supine Heel Slides: AAROM;Left;10 reps;Seated Straight Leg Raises: AROM;Left;10 reps;Supine Goniometric ROM: 0-40    General Comments        Pertinent Vitals/Pain Pain Assessment: 0-10 Pain Score: 8  Faces Pain Scale: Hurts little more Pain Location: L knee Pain Descriptors / Indicators: Sore;Sharp Pain Intervention(s): Limited activity within patient's tolerance;Monitored during session;Premedicated before session;Repositioned    Home Living Family/patient expects to be discharged to:: Private residence Living Arrangements: Spouse/significant other Available Help at Discharge: Family;Available 24 hours/day Type of Home: House Home Access: Stairs to enter Entrance Stairs-Rails: None Home Layout: One level Home Equipment: Environmental consultant - 2 wheels;Bedside commode;Grab bars - tub/shower;Shower seat;Other (comment) (lift chair)      Prior Function Level of Independence: Independent          PT Goals (current goals can now be found in the care  plan section) Acute Rehab PT Goals Patient Stated Goal: to be able to take care of myself Progress towards PT goals: Progressing toward goals    Frequency  7X/week    PT Plan Current plan remains appropriate    Co-evaluation             End of Session Equipment Utilized During Treatment: Gait  belt Activity Tolerance: Patient tolerated treatment well Patient left: with call bell/phone within reach;in bed;in CPM     Time: [id number] PT Time Calculation (min) (ACUTE ONLY): 32 min  Charges:  $Gait Training: 8-22 mins $Therapeutic Exercise: 8-22 mins                    G Codes:      Salina April, PTA Pager: [phone]   10/14/2015, 3:47 PM

## 2015-10-14 NOTE — Progress Notes (Signed)
Physical Therapy Treatment Patient Details Name: [name] [name] MRN: [id number] DOB: 12/02/1933 Today's Date: 10/14/2015    History of Present Illness Patient is a 82 y/o female with hx of spine surgery, diastolic heart failure, frequent urination and breast ca s/p left TKA.    PT Comments    Patient is progressing toward mobility goals but with limited ROM. Continue to progress as tolerated. Current plan remains appropriate.   Follow Up Recommendations  Home health PT;Supervision/Assistance - 24 hour     Equipment Recommendations  None recommended by PT    Recommendations for Other Services       Precautions / Restrictions Precautions Precautions: Knee Restrictions LLE Weight Bearing: Weight bearing as tolerated    Mobility  Bed Mobility Overal bed mobility: Needs Assistance Bed Mobility: Supine to Sit;Sit to Supine     Supine to sit: Min guard Sit to supine: Min assist   General bed mobility comments: pt received on 3 in 1  Transfers Overall transfer level: Needs assistance Equipment used: Rolling walker (2 wheeled) Transfers: Sit to/from Stand Sit to Stand: Min guard Stand pivot transfers: Min guard       General transfer comment: min guard for safety; cues for hand placement  Ambulation/Gait Ambulation/Gait assistance: Supervision Ambulation Distance (Feet): 150 Feet Assistive device: Rolling walker (2 wheeled) Gait Pattern/deviations: Step-through pattern;Decreased stance time - left;Decreased stride length;Trunk flexed     General Gait Details: cues for position of RW and posture   Stairs            Wheelchair Mobility    Modified Rankin (Stroke Patients Only)       Balance Overall balance assessment: Needs assistance   Sitting balance-Leahy Scale: Good     Standing balance support: During functional activity Standing balance-Leahy Scale: Fair                      Cognition Arousal/Alertness: Awake/alert Behavior During  Therapy: WFL for tasks assessed/performed Overall Cognitive Status: Within Functional Limits for tasks assessed                      Exercises Total Joint Exercises Quad Sets: AROM;Both;10 reps;Seated Heel Slides: AAROM;Left;10 reps;Seated Goniometric ROM: 0-40    General Comments        Pertinent Vitals/Pain Pain Assessment: Faces Pain Score: 7  Faces Pain Scale: Hurts little more Pain Location: L knee Pain Descriptors / Indicators: Sore Pain Intervention(s): Limited activity within patient's tolerance;Monitored during session;Premedicated before session;Repositioned    Home Living Family/patient expects to be discharged to:: Private residence Living Arrangements: Spouse/significant other Available Help at Discharge: Family;Available 24 hours/day Type of Home: House Home Access: Stairs to enter Entrance Stairs-Rails: None Home Layout: One level Home Equipment: Environmental consultant - 2 wheels;Bedside commode;Grab bars - tub/shower;Shower seat;Other (comment) (lift chair)      Prior Function Level of Independence: Independent          PT Goals (current goals can now be found in the care plan section) Acute Rehab PT Goals Patient Stated Goal: to be able to take care of myself Progress towards PT goals: Progressing toward goals    Frequency  7X/week    PT Plan Current plan remains appropriate    Co-evaluation             End of Session Equipment Utilized During Treatment: Gait belt Activity Tolerance: Patient tolerated treatment well Patient left: in chair;with call bell/phone within reach;with family/visitor present  Time: [id number] PT Time Calculation (min) (ACUTE ONLY): 29 min  Charges:  $Gait Training: 8-22 mins $Therapeutic Exercise: 8-22 mins                    G Codes:      Salina April, PTA Pager: [phone]    10/14/2015, 3:06 PM

## 2015-10-15 MED ORDER — HYDROCODONE-ACETAMINOPHEN 5-325 MG PO TABS: 1.0000 | ORAL_TABLET | ORAL | Status: DC | PRN

## 2015-10-15 MED ORDER — METHOCARBAMOL 500 MG PO TABS: 500.0000 mg | ORAL_TABLET | Freq: Four times a day (QID) | ORAL | Status: DC | PRN

## 2015-10-15 MED ORDER — ASPIRIN 325 MG PO TBEC: 325.0000 mg | DELAYED_RELEASE_TABLET | Freq: Two times a day (BID) | ORAL | Status: DC

## 2015-10-15 NOTE — Progress Notes (Signed)
Reviewed discharge instructions and medication with Mrs Sferrazza with total understanding

## 2015-10-15 NOTE — Discharge Summary (Signed)
Patient ID: [name] [name] MRN: [id number] DOB/AGE: 80-Mar-1935 80 y.o.  Admit date: 10/13/2015 Discharge date: 10/15/2015  Admission Diagnoses:  Principal Problem:   Primary osteoarthritis of left knee   Discharge Diagnoses:  Same  Past Medical History  Diagnosis Date  . Hypercholesterolemia   . CAD (coronary artery disease)     The patient had a left heart catheterization in August 2006 showed an EF of 70%, a 70% mid LAD lesion and 80% distal LAD ledsion, as well as 50% ostial first diagonal lesion.  The patient did have a drug-eluting stent placed in her mid LAD at that time.    . Diastolic heart failure     Echo 11/10 with EF 55-60%, moderate diastolic dysfunction, no regional WMAs, mild to moderate TR, moderate LAE, mild AI  . Hypertension   . Hypothyroidism   . Bronchitis     hx of  . Mental disorder   . Depression   . Frequent urination at night   . Heart murmur     "small"  . Anginal pain ([place])   . Type II diabetes mellitus ([place])     "controlled w/diet and exercise"  . GERD (gastroesophageal reflux disease)     "gone since gallbladder took out"  . Anxiety   . Family history of adverse reaction to anesthesia   . Sleep disturbance     sleep study done- told that there wasn't any apnea concerns, states she is up & down for use of bathroom several times per night   . Arthritis     "little in my thumbs"back & knees   . Breast cancer ([place])     R breast, treated with mastectomy/tomoxifen in early 2000s    Surgeries: Procedure(s): TOTAL KNEE ARTHROPLASTY on 10/13/2015   Consultants:    Discharged Condition: Improved  Hospital Course: [name] [name] is an 82 y.o. female who was admitted 10/13/2015 for operative treatment ofPrimary osteoarthritis of left knee. Patient has severe unremitting pain that affects sleep, daily activities, and work/hobbies. After pre-op clearance the patient was taken to the operating room on 10/13/2015 and underwent  Procedure(s): TOTAL KNEE  ARTHROPLASTY.    Patient was given perioperative antibiotics: Anti-infectives    Start     Dose/Rate Route Frequency Ordered Stop   10/13/15 1630  ceFAZolin (ANCEF) IVPB 2 g/50 mL premix     2 g 100 mL/hr over 30 Minutes Intravenous Every 6 hours 10/13/15 1439 10/13/15 2210   10/13/15 0700  ceFAZolin (ANCEF) IVPB 2 g/50 mL premix     2 g 100 mL/hr over 30 Minutes Intravenous To [facility]Stay [facility] 10/12/15 1236 10/13/15 1043       Patient was given sequential compression devices, early ambulation, and chemoprophylaxis to prevent DVT.  Patient benefited maximally from hospital stay and there were no complications.    Recent vital signs: Patient Vitals for the past 24 hrs:  BP Temp Temp src Pulse Resp SpO2  10/15/15 1253 118/65 mmHg 98.1 F (36.7 C) Oral 77 18 99 %  10/15/15 1020 (!) 124/54 mmHg - - 62 - -  10/15/15 0507 (!) 124/54 mmHg 98.3 F (36.8 C) Oral 62 18 96 %  10/14/15 2020 128/77 mmHg 99.5 F (37.5 C) Oral 66 - 95 %     Recent laboratory studies: No results for input(s): WBC, HGB, HCT, PLT, NA, K, CL, CO2, BUN, CREATININE, GLUCOSE, INR, CALCIUM in the last 72 hours.  Invalid input(s): PT, 2   Discharge Medications:  Medication List    STOP taking these medications        aspirin 81 MG tablet  Replaced by:  aspirin 325 MG EC tablet      TAKE these medications        aspirin 325 MG EC tablet  Take 1 tablet (325 mg total) by mouth 2 (two) times daily after a meal.     calcium carbonate 600 MG Tabs tablet  Commonly known as:  OS-CAL  Take 600 mg by mouth daily with breakfast.     ezetimibe-simvastatin 10-40 MG tablet  Commonly known as:  VYTORIN  Take 1 tablet by mouth at bedtime.     hydrochlorothiazide 12.5 MG capsule  Commonly known as:  MICROZIDE  Take 1 capsule (12.5 mg total) by mouth daily.     HYDROcodone-acetaminophen 5-325 MG tablet  Commonly known as:  NORCO/VICODIN  Take 1-2 tablets by mouth every 4 (four) hours as needed (breakthrough  pain).     isosorbide mononitrate 120 MG 24 hr tablet  Commonly known as:  IMDUR  Take 120 mg by mouth Daily.     levothyroxine 75 MCG tablet  Commonly known as:  SYNTHROID, LEVOTHROID  Take 75 mcg by mouth daily.     losartan 100 MG tablet  Commonly known as:  COZAAR  Take 1 tablet (100 mg total) by mouth daily.     methocarbamol 500 MG tablet  Commonly known as:  ROBAXIN  Take 1 tablet (500 mg total) by mouth every 6 (six) hours as needed for muscle spasms.     metoprolol succinate 50 MG 24 hr tablet  Commonly known as:  TOPROL-XL  Take 50 mg by mouth daily.     traZODone 150 MG tablet  Commonly known as:  DESYREL  Take 150 mg by mouth at bedtime.     venlafaxine XR 150 MG 24 hr capsule  Commonly known as:  EFFEXOR-XR  Take 150 mg by mouth 2 (two) times daily.        Diagnostic Studies: No results found.  Disposition: 01-Home or Self Care      Discharge Instructions    Call MD / Call 911    Complete by:  As directed   If you experience chest pain or shortness of breath, CALL 911 and be transported to the hospital emergency room.  If you develope a fever above 101 F, pus (white drainage) or increased drainage or redness at the wound, or calf pain, call your surgeon's office.     Constipation Prevention    Complete by:  As directed   Drink plenty of fluids.  Prune juice may be helpful.  You may use a stool softener, such as Colace (over the counter) 100 mg twice a day.  Use MiraLax (over the counter) for constipation as needed.     Diet - low sodium heart healthy    Complete by:  As directed      Discharge instructions    Complete by:  As directed   INSTRUCTIONS AFTER JOINT REPLACEMENT   Remove items at home which could result in a fall. This includes throw rugs or furniture in walking pathways ICE to the affected joint every three hours while awake for 30 minutes at a time, for at least the first 3-5 days, and then as needed for pain and swelling.  Continue to use  ice for pain and swelling. You may notice swelling that will progress down to the foot and ankle.  This is  normal after surgery.  Elevate your leg when you are not up walking on it.   Continue to use the breathing machine you got in the hospital (incentive spirometer) which will help keep your temperature down.  It is common for your temperature to cycle up and down following surgery, especially at night when you are not up moving around and exerting yourself.  The breathing machine keeps your lungs expanded and your temperature down.   DIET:  As you were doing prior to hospitalization, we recommend a well-balanced diet.  DRESSING / WOUND CARE / SHOWERING  You may shower 3 days after surgery, but keep the wounds dry during showering.  You may use an occlusive plastic wrap (Press'n Seal for example), NO SOAKING/SUBMERGING IN THE BATHTUB.  If the bandage gets wet, change with a clean dry gauze.  If the incision gets wet, pat the wound dry with a clean towel.  ACTIVITY  Increase activity slowly as tolerated, but follow the weight bearing instructions below.   No driving for 6 weeks or until further direction given by your physician.  You cannot drive while taking narcotics.  No lifting or carrying greater than 10 lbs. until further directed by your surgeon. Avoid periods of inactivity such as sitting longer than an hour when not asleep. This helps prevent blood clots.  You may return to work once you are authorized by your doctor.     WEIGHT BEARING   Weight bearing as tolerated with assist device (walker, cane, etc) as directed, use it as long as suggested by your surgeon or therapist, typically at least 4-6 weeks.   EXERCISES  Results after joint replacement surgery are often greatly improved when you follow the exercise, range of motion and muscle strengthening exercises prescribed by your doctor. Safety measures are also important to protect the joint from further injury. Any time any of  these exercises cause you to have increased pain or swelling, decrease what you are doing until you are comfortable again and then slowly increase them. If you have problems or questions, call your caregiver or physical therapist for advice.   Rehabilitation is important following a joint replacement. After just a few days of immobilization, the muscles of the leg can become weakened and shrink (atrophy).  These exercises are designed to build up the tone and strength of the thigh and leg muscles and to improve motion. Often times heat used for twenty to thirty minutes before working out will loosen up your tissues and help with improving the range of motion but do not use heat for the first two weeks following surgery (sometimes heat can increase post-operative swelling).   These exercises can be done on a training (exercise) mat, on the floor, on a table or on a bed. Use whatever works the best and is most comfortable for you.    Use music or television while you are exercising so that the exercises are a pleasant break in your day. This will make your life better with the exercises acting as a break in your routine that you can look forward to.   Perform all exercises about fifteen times, three times per day or as directed.  You should exercise both the operative leg and the other leg as well.   Exercises include:   Quad Sets - Tighten up the muscle on the front of the thigh (Quad) and hold for 5-10 seconds.   Straight Leg Raises - With your knee straight (if you were given a  brace, keep it on), lift the leg to 60 degrees, hold for 3 seconds, and slowly lower the leg.  Perform this exercise against resistance later as your leg gets stronger.  Leg Slides: Lying on your back, slowly slide your foot toward your buttocks, bending your knee up off the floor (only go as far as is comfortable). Then slowly slide your foot back down until your leg is flat on the floor again.  Angel Wings: Lying on your back  spread your legs to the side as far apart as you can without causing discomfort.  Hamstring Strength:  Lying on your back, push your heel against the floor with your leg straight by tightening up the muscles of your buttocks.  Repeat, but this time bend your knee to a comfortable angle, and push your heel against the floor.  You may put a pillow under the heel to make it more comfortable if necessary.   A rehabilitation program following joint replacement surgery can speed recovery and prevent re-injury in the future due to weakened muscles. Contact your doctor or a physical therapist for more information on knee rehabilitation.    CONSTIPATION  Constipation is defined medically as fewer than three stools per week and severe constipation as less than one stool per week.  Even if you have a regular bowel pattern at home, your normal regimen is likely to be disrupted due to multiple reasons following surgery.  Combination of anesthesia, postoperative narcotics, change in appetite and fluid intake all can affect your bowels.   YOU MUST use at least one of the following options; they are listed in order of increasing strength to get the job done.  They are all available over the counter, and you may need to use some, POSSIBLY even all of these options:    Drink plenty of fluids (prune juice may be helpful) and high fiber foods Colace 100 mg by mouth twice a day  Senokot for constipation as directed and as needed Dulcolax (bisacodyl), take with full glass of water  Miralax (polyethylene glycol) once or twice a day as needed.  If you have tried all these things and are unable to have a bowel movement in the first 3-4 days after surgery call either your surgeon or your primary doctor.    If you experience loose stools or diarrhea, hold the medications until you stool forms back up.  If your symptoms do not get better within 1 week or if they get worse, check with your doctor.  If you experience "the  worst abdominal pain ever" or develop nausea or vomiting, please contact the office immediately for further recommendations for treatment.   ITCHING:  If you experience itching with your medications, try taking only a single pain pill, or even half a pain pill at a time.  You can also use Benadryl over the counter for itching or also to help with sleep.   TED HOSE STOCKINGS:  Use stockings on both legs until for at least 2 weeks or as directed by physician office. They may be removed at night for sleeping.  MEDICATIONS:  See your medication summary on the "After Visit Summary" that nursing will review with you.  You may have some home medications which will be placed on hold until you complete the course of blood thinner medication.  It is important for you to complete the blood thinner medication as prescribed.  PRECAUTIONS:  If you experience chest pain or shortness of breath - call 911 immediately  for transfer to the hospital emergency department.   If you develop a fever greater that 101 F, purulent drainage from wound, increased redness or drainage from wound, foul odor from the wound/dressing, or calf pain - CONTACT YOUR SURGEON.                                                   FOLLOW-UP APPOINTMENTS:  If you do not already have a post-op appointment, please call the office for an appointment to be seen by your surgeon.  Guidelines for how soon to be seen are listed in your "After Visit Summary", but are typically between 1-4 weeks after surgery.  OTHER INSTRUCTIONS:   Knee Replacement:  Do not place pillow under knee, focus on keeping the knee straight while resting. CPM instructions: 0-90 degrees, 2 hours in the morning, 2 hours in the afternoon, and 2 hours in the evening. Place foam block, curve side up under heel at all times except when in CPM or when walking.  DO NOT modify, tear, cut, or change the foam block in any way.  MAKE SURE YOU:  Understand these instructions.  Get help  right away if you are not doing well or get worse.    Thank you for letting us be a part of your medical care team.  It is a privilege we respect greatly.  We hope these instructions will help you stay on track for a fast and full recovery!     Increase activity slowly as tolerated    Complete by:  As directed            Follow-up Information    Follow up with Hessie Dibble, MD. Schedule an appointment as soon as possible for a visit in 2 weeks.   Specialty:  Orthopedic Surgery   Contact information:   [address] [address] [phone]       Follow up with [facility]   Why:  They will contact you to schedule home therapy visits.   Contact information:   tel# [phone]       Signed: Rich Fuchs 10/15/2015, 1:56 PM

## 2015-10-15 NOTE — Progress Notes (Signed)
Orthopedic Tech Progress Note Patient Details:  [name] [name] 1934/07/09 [id number]  Patient ID: [name] [name], female   DOB: April 08, 1934, 82 y.o.   MRN: [id number] Pt on cpm 0-70  [name] [name] 10/15/2015, 5:40 AM

## 2015-10-15 NOTE — Progress Notes (Signed)
Physical Therapy Treatment Patient Details Name: [name] [name] MRN: [id number] DOB: 1933-09-29 Today's Date: 10/15/2015    History of Present Illness Patient is a 82 y/o female with hx of spine surgery, diastolic heart failure, frequent urination and breast ca s/p left TKA.    PT Comments    Patient continues to progress toward mobility goals with improved gait mechanics and ability to WB on L LE. Current plan remains appropriate.   Follow Up Recommendations  Home health PT;Supervision/Assistance - 24 hour     Equipment Recommendations  None recommended by PT    Recommendations for Other Services       Precautions / Restrictions Precautions Precautions: Knee Restrictions LLE Weight Bearing: Weight bearing as tolerated    Mobility  Bed Mobility               General bed mobility comments: OOB in chair upon arrival  Transfers Overall transfer level: Needs assistance Equipment used: Rolling walker (2 wheeled) Transfers: Sit to/from Stand Sit to Stand: Supervision         General transfer comment: X 2 from recliner chair and BSC; cues for hand placement and technique  Ambulation/Gait Ambulation/Gait assistance: Supervision Ambulation Distance (Feet): 175 Feet Assistive device: Rolling walker (2 wheeled) Gait Pattern/deviations: Step-through pattern;Decreased stride length;Decreased stance time - left;Decreased step length - right;Antalgic;Trunk flexed     General Gait Details: cues for L heel strike and encouragement to increase WB on L LE for decreased reliance on RW; cues for sequencing, posture, and position in RW; pt demonstrated improved gait mechanics and weightbearing   Stairs            Wheelchair Mobility    Modified Rankin (Stroke Patients Only)       Balance     Sitting balance-Leahy Scale: Good       Standing balance-Leahy Scale: Fair                      Cognition Arousal/Alertness: Awake/alert Behavior During  Therapy: WFL for tasks assessed/performed Overall Cognitive Status: Within Functional Limits for tasks assessed                      Exercises Total Joint Exercises Quad Sets: AROM;Both;10 reps;Seated Short Arc Quad: AROM;Left;10 reps;Seated Heel Slides: AAROM;Left;10 reps;Seated Hip ABduction/ADduction: AROM;AAROM;Left;10 reps;Seated Goniometric ROM: 0-35    General Comments        Pertinent Vitals/Pain Pain Assessment: 0-10 Pain Score: 7  Pain Location: L knee Pain Descriptors / Indicators: Sore Pain Intervention(s): Limited activity within patient's tolerance;Monitored during session;Premedicated before session;Repositioned    Home Living                      Prior Function            PT Goals (current goals can now be found in the care plan section) Acute Rehab PT Goals Patient Stated Goal: to be able to take care of myself Progress towards PT goals: Progressing toward goals    Frequency  7X/week    PT Plan Current plan remains appropriate    Co-evaluation             End of Session Equipment Utilized During Treatment: Gait belt Activity Tolerance: Patient tolerated treatment well Patient left: with call bell/phone within reach;in chair     Time: [id number] PT Time Calculation (min) (ACUTE ONLY): 32 min  Charges:  $Gait Training: 8-22 mins $Therapeutic Exercise: 8-22 mins  G Codes:      [place] April, PTA Pager: [phone]   10/15/2015, 12:38 PM

## 2015-10-15 NOTE — Progress Notes (Signed)
Occupational Therapy Treatment/Discharge Patient Details Name: [name] [name] MRN: [id number] DOB: [birth year] Today's Date: 10/15/2015    History of present illness Patient is a 82 y/o female with hx of spine surgery, diastolic heart failure, frequent urination and breast ca s/p left TKA.   OT comments  Pt making good progress towards occupational therapy goals. Pt completed ADLs with min-mod assist and functional transfers with min guard assist. Educated pt on use of AE for LB ADLs, energy conservation, and fall prevention strategies. All education has been completed and pt has no further questions. OT signing off.    Follow Up Recommendations  Home health OT;Supervision/Assistance - 24 hour    Equipment Recommendations  None recommended by OT    Recommendations for Other Services      Precautions / Restrictions Precautions Precautions: Knee Precaution Booklet Issued: No Precaution Comments: Reviewed no pillow, ice pack or other object underneath knee Restrictions Weight Bearing Restrictions: Yes LLE Weight Bearing: Weight bearing as tolerated       Mobility Bed Mobility               General bed mobility comments: Pt up on BSC on OT arrival  Transfers Overall transfer level: Needs assistance Equipment used: Rolling walker (2 wheeled) Transfers: Sit to/from Stand Sit to Stand: Min guard         General transfer comment: Min guard assist for safety and balance. Verbal cues for safe hand placement and proper AD use.     Balance Overall balance assessment: Needs assistance Sitting-balance support: No upper extremity supported;Feet supported Sitting balance-Leahy Scale: Good     Standing balance support: Bilateral upper extremity supported;During functional activity Standing balance-Leahy Scale: Fair                     ADL Overall ADL's : Needs assistance/impaired     Grooming: Wash/dry hands;Min guard;Standing           Upper Body Dressing :  Set up;Sitting;With caregiver independent assisting   Lower Body Dressing: Minimal assistance;Sit to/from stand;Cueing for compensatory techniques;With adaptive equipment Lower Body Dressing Details (indicate cue type and reason): Unable to reach bilateral feet, cues to dress LLE first and undress it last.  Practiced with AE (reacher) and pt has several at home that she will use Toilet Transfer: Min guard;BSC;Ambulation;Cueing for Office manager Details (indicate cue type and reason): BSC over toilet, cues for hand placement Toileting- Clothing Manipulation and Hygiene: Min guard;Sit to/from stand       Functional mobility during ADLs: Min guard;Rolling walker General ADL Comments: Completed AE education and pt agreed reacher was most useful piece and has several at home. Reviewed energy conservation and fall prevention strategies.      Vision                     Perception     Praxis      Cognition   Behavior During Therapy: [facility] for tasks assessed/performed Overall Cognitive Status: Within Functional Limits for tasks assessed                       Extremity/Trunk Assessment               Exercises Total Joint Exercises Quad Sets: AROM;Both;10 reps;Seated Short Arc Quad: AROM;Left;10 reps;Seated Heel Slides: AAROM;Left;10 reps;Seated Hip ABduction/ADduction: AROM;AAROM;Left;10 reps;Seated Straight Leg Raises: AROM;Left;5 reps;Seated Long Arc Quad: AAROM;Left;10 reps;Seated Knee Flexion: AROM;Left;10 reps;Seated Goniometric ROM: 0-35  Shoulder Instructions       General Comments      Pertinent Vitals/ Pain       Pain Assessment: Faces Pain Score: 7  Faces Pain Scale: Hurts little more Pain Location: L knee Pain Descriptors / Indicators: Burning;Sore Pain Intervention(s): Limited activity within patient's tolerance;Monitored during session;Repositioned  Home Living                                          Prior  Functioning/Environment              Frequency       Progress Toward Goals  OT Goals(current goals can now be found in the care plan section)  Progress towards OT goals: Goals met/education completed, patient discharged from OT  Acute Rehab OT Goals Patient Stated Goal: to be able to take care of myself OT Goal Formulation: With patient Time For Goal Achievement: 10/21/15 Potential to Achieve Goals: Good ADL Goals Pt Will Perform Lower Body Bathing: with min assist;with adaptive equipment;sit to/from stand Pt Will Perform Lower Body Dressing: with adaptive equipment;sit to/from stand;with min assist Pt Will Transfer to Toilet: with supervision;ambulating;bedside commode Pt Will Perform Toileting - Clothing Manipulation and hygiene: with modified independence;sit to/from stand  Plan All goals met and education completed, patient discharged from OT services    Co-evaluation                 End of Session Equipment Utilized During Treatment: Gait belt;Rolling walker   Activity Tolerance Patient tolerated treatment well   Patient Left in chair;with call bell/phone within reach;with family/visitor present   Nurse Communication Mobility status        Time: 6484-7207 OT Time Calculation (min): 16 min  Charges: OT General Charges $OT Visit: 1 Procedure OT Treatments $Self Care/Home Management : 8-22 mins  Redmond Baseman, OTR/L Pager: [phone] 10/15/2015, 3:31 PM

## 2015-10-15 NOTE — Progress Notes (Signed)
Physical Therapy Treatment Patient Details Name: [name] [name] MRN: [id number] DOB: Jul 17, 1934 Today's Date: 10/15/2015    History of Present Illness Patient is a 82 y/o female with hx of spine surgery, diastolic heart failure, frequent urination and breast ca s/p left TKA.    PT Comments    Patient is making good progress with PT.  From a mobility standpoint anticipate patient will be ready for DC home when medically ready.     Follow Up Recommendations  Home health PT;Supervision/Assistance - 24 hour     Equipment Recommendations  None recommended by PT    Recommendations for Other Services       Precautions / Restrictions Precautions Precautions: Knee Restrictions LLE Weight Bearing: Weight bearing as tolerated    Mobility  Bed Mobility               General bed mobility comments: OOB in chair upon arrival  Transfers Overall transfer level: Needs assistance Equipment used: Rolling walker (2 wheeled) Transfers: Sit to/from Stand Sit to Stand: Supervision         General transfer comment: carry over of hand placement; cues for technique and use of AD  Ambulation/Gait Ambulation/Gait assistance: Supervision Ambulation Distance (Feet): 120 Feet Assistive device: Rolling walker (2 wheeled) Gait Pattern/deviations: Step-through pattern;Decreased dorsiflexion - left;Trunk flexed;Decreased stance time - left     General Gait Details: cues for posture and position of RW; pt demonstrated carry over of cues for L heel strike and sequencing   Stairs Stairs: Yes Stairs assistance: Min guard Stair Management: No rails;Forwards;With walker Number of Stairs: 1 General stair comments: educated pt on technique and sequencing; no knee buckling or instability noted  Wheelchair Mobility    Modified Rankin (Stroke Patients Only)       Balance     Sitting balance-Leahy Scale: Good       Standing balance-Leahy Scale: Fair                       Cognition Arousal/Alertness: Awake/alert Behavior During Therapy: WFL for tasks assessed/performed Overall Cognitive Status: Within Functional Limits for tasks assessed                      Exercises Total Joint Exercises Quad Sets: AROM;Both;10 reps;Seated Short Arc Quad: AROM;Left;10 reps;Seated Heel Slides: AAROM;Left;10 reps;Seated Hip ABduction/ADduction: AROM;AAROM;Left;10 reps;Seated Straight Leg Raises: AROM;Left;5 reps;Seated Long Arc Quad: AAROM;Left;10 reps;Seated Knee Flexion: AROM;Left;10 reps;Seated Goniometric ROM: 0-35    General Comments General comments (skin integrity, edema, etc.): reviewed HEP handout and use of ice, CPM, and zero degree foam      Pertinent Vitals/Pain Pain Assessment: Faces Pain Score: 7  Faces Pain Scale: Hurts little more Pain Location: L knee with mobility Pain Descriptors / Indicators: Burning;Sore Pain Intervention(s): Limited activity within patient's tolerance;Monitored during session;Premedicated before session;Repositioned    Home Living                      Prior Function            PT Goals (current goals can now be found in the care plan section) Acute Rehab PT Goals Patient Stated Goal: to be able to take care of myself Progress towards PT goals: Progressing toward goals    Frequency  7X/week    PT Plan Current plan remains appropriate    Co-evaluation             End of Session Equipment Utilized  During Treatment: Gait belt Activity Tolerance: Patient tolerated treatment well Patient left: with call bell/phone within reach;in chair     Time: 1421-1441 PT Time Calculation (min) (ACUTE ONLY): 20 min  Charges:  $Gait Training: 8-22 mins $Therapeutic Exercise: 8-22 mins                    G Codes:      Salina April, PTA Pager: [phone]   10/15/2015, 2:48 PM

## 2015-10-21 ENCOUNTER — Ambulatory Visit (INDEPENDENT_AMBULATORY_CARE_PROVIDER_SITE_OTHER): Payer: Medicare Other | Admitting: Ophthalmology

## 2015-11-12 ENCOUNTER — Ambulatory Visit (INDEPENDENT_AMBULATORY_CARE_PROVIDER_SITE_OTHER): Payer: Medicare Other | Admitting: Ophthalmology

## 2015-11-12 DIAGNOSIS — I10 Essential (primary) hypertension: Secondary | ICD-10-CM

## 2015-11-12 DIAGNOSIS — E113293 Type 2 diabetes mellitus with mild nonproliferative diabetic retinopathy without macular edema, bilateral: Secondary | ICD-10-CM

## 2015-11-12 DIAGNOSIS — H338 Other retinal detachments: Secondary | ICD-10-CM

## 2015-11-12 DIAGNOSIS — E11319 Type 2 diabetes mellitus with unspecified diabetic retinopathy without macular edema: Secondary | ICD-10-CM | POA: Diagnosis not present

## 2015-11-12 DIAGNOSIS — H35033 Hypertensive retinopathy, bilateral: Secondary | ICD-10-CM | POA: Diagnosis not present

## 2015-11-12 DIAGNOSIS — H353131 Nonexudative age-related macular degeneration, bilateral, early dry stage: Secondary | ICD-10-CM | POA: Diagnosis not present

## 2015-11-12 DIAGNOSIS — H43813 Vitreous degeneration, bilateral: Secondary | ICD-10-CM | POA: Diagnosis not present

## 2016-09-07 NOTE — H&P (Signed)
TOTAL KNEE ADMISSION H&P  Patient is being admitted for right total knee arthroplasty.  Subjective:  Chief Complaint:right knee pain.  HPI: [name] [name], 83 y.o. female, has a history of pain and functional disability in the right knee due to arthritis and has failed non-surgical conservative treatments for greater than 12 weeks to includeNSAID's and/or analgesics, corticosteriod injections, viscosupplementation injections, flexibility and strengthening excercises, use of assistive devices, weight reduction as appropriate and activity modification.  Onset of symptoms was gradual, starting 5 years ago with gradually worsening course since that time. The patient noted no past surgery on the right knee(s).  Patient currently rates pain in the right knee(s) at 10 out of 10 with activity. Patient has night pain, worsening of pain with activity and weight bearing, pain that interferes with activities of daily living, crepitus and joint swelling.  Patient has evidence of subchondral cysts, subchondral sclerosis, periarticular osteophytes and joint space narrowing by imaging studies.  There is no active infection.  Patient Active Problem List   Diagnosis Date Noted  . Primary osteoarthritis of left knee 10/13/2015  . Radiculopathy 07/08/2015  . 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 Preoperative clearance 03/20/2013  . DIASTOLIC HEART FAILURE, CHRONIC 07/03/2009  . Aortic valve disorder 06/17/2009  . UNSPECIFIED HYPOTHYROIDISM 07/28/2008  . Hyperlipidemia 07/28/2008  . Essential hypertension 07/28/2008  . CAD, NATIVE VESSEL 07/28/2008   Past Medical History:  Diagnosis Date  . Anginal pain ([place])   . Anxiety   . Arthritis    "little in my thumbs"back & knees   . Breast cancer ([place])    R breast, treated with mastectomy/tomoxifen in early 2000s  . Bronchitis    hx of  . CAD (coronary artery disease)    The patient had a left heart catheterization in August 2006 showed an EF of 70%, a 70%  mid LAD lesion and 80% distal LAD ledsion, as well as 50% ostial first diagonal lesion.  The patient did have a drug-eluting stent placed in her mid LAD at that time.    . Depression   . Diastolic heart failure    Echo 11/10 with EF 55-60%, moderate diastolic dysfunction, no regional WMAs, mild to moderate TR, moderate LAE, mild AI  . Family history of adverse reaction to anesthesia   . Frequent urination at night   . GERD (gastroesophageal reflux disease)    "gone since gallbladder took out"  . Heart murmur    "small"  . Hypercholesterolemia   . Hypertension   . Hypothyroidism   . Mental disorder   . Sleep disturbance    sleep study done- told that there wasn't any apnea concerns, states she is up & down for use of bathroom several times per night   . Type II diabetes mellitus ([place])    "controlled w/diet and exercise"    Past Surgical History:  Procedure Laterality Date  . [place] VITRECTOMY WITH 20 GAUGE MVR PORT Right 06/04/2013   Procedure: 25 GAUGE PARS PLANA VITRECTOMY WITH 20 GAUGE MVR PORT / REMOVAL SILICONE OIL RIGHT EYE. With Endolaser;  Surgeon: Hayden Pedro, MD;  Location: [facility];  Service: Ophthalmology;  Laterality: Right;  . ABDOMINAL HYSTERECTOMY  1964  . Adenosine Myoview     9/09: EF 69% with normal perfusion images suggesting no ischemia or infarction.    . APPENDECTOMY  1954  . BACK SURGERY  06/2015   fusion-lumbar  . BREAST BIOPSY  2001   right  . CARDIAC  CATHETERIZATION    . CATARACT EXTRACTION W/ INTRAOCULAR LENS  IMPLANT, BILATERAL  ~ 2000  . CHOLECYSTECTOMY  ~ 2004  . COLONOSCOPY    . CORONARY ANGIOPLASTY    . CORONARY ANGIOPLASTY WITH STENT PLACEMENT  2000's   "1"  . DILATION AND CURETTAGE OF UTERUS  1960's   "I had 3"  . EYE SURGERY     /iol    following cataract removal  . GAS/FLUID EXCHANGE Right 06/04/2013   Procedure: GAS/FLUID EXCHANGE;  Surgeon: Hayden Pedro, MD;  Location: [facility];  Service: Ophthalmology;  Laterality: Right;   . MASTECTOMY  2001   right breast  . MEMBRANE PEEL Right 06/04/2013   Procedure: MEMBRANE PEEL;  Surgeon: Hayden Pedro, MD;  Location: [facility];  Service: Ophthalmology;  Laterality: Right;  . NEUROPLASTY / TRANSPOSITION MEDIAN NERVE AT CARPAL TUNNEL BILATERAL  1990's  . PARS PLANA VITRECTOMY  04/03/2012   right  . PARS PLANA VITRECTOMY  04/03/2012   Procedure: PARS PLANA VITRECTOMY WITH 25 GAUGE;  Surgeon: Hayden Pedro, MD;  Location: [facility];  Service: Ophthalmology;  Laterality: Right;  Silicone Oil, Perfluron, Repair Complex Traction Retinal Detachment  . PLANTAR FASCIA RELEASE  1990's   left  . TONSILLECTOMY AND ADENOIDECTOMY  1972  . TOTAL KNEE ARTHROPLASTY Left 10/13/2015   Procedure: TOTAL KNEE ARTHROPLASTY;  Surgeon: Melrose Nakayama, MD;  Location: [facility];  Service: Orthopedics;  Laterality: Left;  . ULNAR NERVE REPAIR     left arm    No prescriptions prior to admission.   No Known Allergies  Social History  Substance Use Topics  . Smoking status: Never Smoker  . Smokeless tobacco: Never Used  . Alcohol use No    No family history on file.   Review of Systems  Musculoskeletal: Positive for joint pain.       Right knee  All other systems reviewed and are negative.   Objective:  Physical Exam  Constitutional: She is oriented to person, place, and time. She appears well-developed and well-nourished.  HENT:  Head: Normocephalic and atraumatic.  Eyes: Pupils are equal, round, and reactive to light.  Neck: Normal range of motion.  Cardiovascular: Normal rate and regular rhythm.   Respiratory: Effort normal.  GI: Soft.  Musculoskeletal:  Right knee motion is about 5-110.  She has medial joint line pain and crepitation.  There is no effusion.   Hip motion is full and pain free and SLR is negative on both sides.  There is no palpable LAD behind either knee.  Sensation and motor function are intact on both sides and there are palpable pulses on both sides.     Neurological: She is alert and oriented to person, place, and time.  Skin: Skin is warm and dry.  Psychiatric: She has a normal mood and affect. Her behavior is normal. Judgment and thought content normal.    Vital signs in last 24 hours:    Labs:   Estimated body mass index is 29.25 kg/m as calculated from the following:   Height as of 10/02/15: 5\' 3"  (1.6 m).   Weight as of 10/02/15: 74.9 kg (165 lb 1.6 oz).   Imaging Review Plain radiographs demonstrate severe degenerative joint disease of the right knee(s). The overall alignment isneutral. The bone quality appears to be good for age and reported activity level.  Assessment/Plan:  End stage primary arthritis, right knee   The patient history, physical examination, clinical judgment of the provider and imaging studies  are consistent with end stage degenerative joint disease of the right knee(s) and total knee arthroplasty is deemed medically necessary. The treatment options including medical management, injection therapy arthroscopy and arthroplasty were discussed at length. The risks and benefits of total knee arthroplasty were presented and reviewed. The risks due to aseptic loosening, infection, stiffness, patella tracking problems, thromboembolic complications and other imponderables were discussed. The patient acknowledged the explanation, agreed to proceed with the plan and consent was signed. Patient is being admitted for inpatient treatment for surgery, pain control, PT, OT, prophylactic antibiotics, VTE prophylaxis, progressive ambulation and ADL's and discharge planning. The patient is planning to be discharged home with home health services

## 2016-09-08 ENCOUNTER — Other Ambulatory Visit: Payer: Self-pay | Admitting: Orthopaedic Surgery

## 2016-09-13 ENCOUNTER — Ambulatory Visit (HOSPITAL_COMMUNITY)
Admission: RE | Admit: 2016-09-13 | Discharge: 2016-09-13 | Disposition: A | Discharge status: Home / Self Care | Payer: Medicare Other | Source: Ambulatory Visit | Attending: Orthopaedic Surgery | Admitting: Orthopaedic Surgery

## 2016-09-13 ENCOUNTER — Encounter (HOSPITAL_COMMUNITY): Payer: Self-pay

## 2016-09-13 ENCOUNTER — Encounter (HOSPITAL_COMMUNITY)
Admission: RE | Admit: 2016-09-13 | Discharge: 2016-09-13 | Disposition: A | Discharge status: Home / Self Care | Payer: Medicare Other | Source: Ambulatory Visit | Attending: Orthopaedic Surgery | Admitting: Orthopaedic Surgery

## 2016-09-13 ENCOUNTER — Other Ambulatory Visit (HOSPITAL_COMMUNITY): Payer: Self-pay | Admitting: *Deleted

## 2016-09-13 DIAGNOSIS — Z01818 Encounter for other preprocedural examination: Secondary | ICD-10-CM | POA: Insufficient documentation

## 2016-09-13 DIAGNOSIS — M1711 Unilateral primary osteoarthritis, right knee: Secondary | ICD-10-CM | POA: Diagnosis not present

## 2016-09-13 HISTORY — DX: Unspecified urinary incontinence: R32

## 2016-09-13 LAB — URINALYSIS, ROUTINE W REFLEX MICROSCOPIC
Bacteria, UA: NONE SEEN
Bilirubin Urine: NEGATIVE
Glucose, UA: NEGATIVE mg/dL
Ketones, ur: NEGATIVE mg/dL
Leukocytes, UA: NEGATIVE
Nitrite: NEGATIVE
Protein, ur: NEGATIVE mg/dL
Specific Gravity, Urine: 1.017 (ref 1.005–1.030)
pH: 5 (ref 5.0–8.0)

## 2016-09-13 LAB — BASIC METABOLIC PANEL
Anion gap: 10 (ref 5–15)
BUN: 12 mg/dL (ref 6–20)
CO2: 30 mmol/L (ref 22–32)
Calcium: 9.7 mg/dL (ref 8.9–10.3)
Chloride: 100 mmol/L — ABNORMAL LOW (ref 101–111)
Creatinine, Ser: 0.8 mg/dL (ref 0.44–1.00)
GFR calc Af Amer: >60 mL/min (ref >60)
GFR calc non Af Amer: >60 mL/min (ref >60)
Glucose, Bld: 81 mg/dL (ref 65–99)
Potassium: 3.7 mmol/L (ref 3.5–5.1)
Sodium: 140 mmol/L (ref 135–145)

## 2016-09-13 LAB — CBC WITH DIFFERENTIAL/PLATELET
Basophils Absolute: 0 10*3/uL (ref 0.0–0.1)
Basophils Relative: 0 %
Eosinophils Absolute: 0.2 10*3/uL (ref 0.0–0.7)
Eosinophils Relative: 3 %
HCT: 40.7 % (ref 36.0–46.0)
Hemoglobin: 13.6 g/dL (ref 12.0–15.0)
Lymphocytes Relative: 27 %
Lymphs Abs: 1.9 10*3/uL (ref 0.7–4.0)
MCH: 30.4 pg (ref 26.0–34.0)
MCHC: 33.4 g/dL (ref 30.0–36.0)
MCV: 90.8 fL (ref 78.0–100.0)
Monocytes Absolute: 0.5 10*3/uL (ref 0.1–1.0)
Monocytes Relative: 6 %
Neutro Abs: 4.7 10*3/uL (ref 1.7–7.7)
Neutrophils Relative %: 64 %
Platelets: 238 10*3/uL (ref 150–400)
RBC: 4.48 MIL/uL (ref 3.87–5.11)
RDW: 13.7 % (ref 11.5–15.5)
WBC: 7.3 10*3/uL (ref 4.0–10.5)

## 2016-09-13 LAB — TYPE AND SCREEN
ABO/RH(D): O NEG
Antibody Screen: NEGATIVE

## 2016-09-13 LAB — SURGICAL PCR SCREEN
MRSA, PCR: NEGATIVE
Staphylococcus aureus: NEGATIVE

## 2016-09-13 LAB — PROTIME-INR
INR: 0.96
Prothrombin Time: 12.8 seconds (ref 11.4–15.2)

## 2016-09-13 LAB — APTT: aPTT: 28 seconds (ref 24–36)

## 2016-09-13 NOTE — Pre-Procedure Instructions (Signed)
    [name] [name]  09/13/2016      [place], [place] - [address] [place] [address] Phone: [phone] Fax: [facility] - [place], [place] [address] Phone: [phone] Fax: [phone]  [facility] ORDER) [place], [place] [address] Phone: [phone] Fax: [phone] Service - [place], [address] AT [place] [phone] [place] [address] Phone: [phone] Fax: [phone]    Your procedure is scheduled on 09-20-2016   Tuesday .  Report to [facility] Admitting at 10:45 A.M.   Call this number if you have problems the morning of surgery:  [phone]   Remember:  Do not eat food or drink liquids after midnight.   Take these medicines the morning of surgery with A SIP OF WATER Imdur,Synthroid(Levothyroxine),metoprolol (Toprol XL),venlafaxine(Effexor)           STOP ASPIRIN,ANTIINFLAMATORIES (IBUPROFEN,ALEVE,MOTRIN,ADVIL,GOODY'S POWDERS),HERBAL SUPPLEMENTS,FISH OIL,AND VITAMINS 5-7 DAYS PRIOR TO SURGERY   Do not wear jewelry, make-up or nail polish.  Do not wear lotions, powders, or perfumes, or deoderant.  Do not shave 48 hours prior to surgery.     Do not bring valuables to the hospital.  [facility] is not responsible for any belongings or valuables.  Contacts, dentures or bridgework may not be worn into surgery.  Leave your suitcase in the car.  After surgery it may be brought to your room.  For patients admitted to the hospital, discharge time will be determined by your treatment team.  Patients discharged the day of surgery will not be allowed to drive home.    Special instructions:  See Attached Sheet for instructions on CHG showers

## 2016-09-14 LAB — HEMOGLOBIN A1C
Hgb A1c MFr Bld: 5.4 % (ref 4.8–5.6)
Mean Plasma Glucose: 108 mg/dL

## 2016-09-20 ENCOUNTER — Encounter (HOSPITAL_COMMUNITY): Payer: Self-pay | Admitting: Anesthesiology

## 2016-09-20 ENCOUNTER — Inpatient Hospital Stay (HOSPITAL_COMMUNITY): Payer: Medicare Other | Admitting: Anesthesiology

## 2016-09-20 ENCOUNTER — Encounter (HOSPITAL_COMMUNITY)
Admission: RE | Disposition: A | Discharge status: Skilled Nursing Facility | Payer: Self-pay | Source: Ambulatory Visit | Attending: Orthopaedic Surgery

## 2016-09-20 ENCOUNTER — Inpatient Hospital Stay (HOSPITAL_COMMUNITY)
Admission: RE | Admit: 2016-09-20 | Discharge: 2016-09-23 | DRG: 470 | Disposition: A | Discharge status: Skilled Nursing Facility | Payer: Medicare Other | Source: Ambulatory Visit | Attending: Orthopaedic Surgery | Admitting: Orthopaedic Surgery

## 2016-09-20 DIAGNOSIS — F419 Anxiety disorder, unspecified: Secondary | ICD-10-CM | POA: Diagnosis present

## 2016-09-20 DIAGNOSIS — Z961 Presence of intraocular lens: Secondary | ICD-10-CM | POA: Diagnosis present

## 2016-09-20 DIAGNOSIS — E119 Type 2 diabetes mellitus without complications: Secondary | ICD-10-CM | POA: Diagnosis present

## 2016-09-20 DIAGNOSIS — Z853 Personal history of malignant neoplasm of breast: Secondary | ICD-10-CM

## 2016-09-20 DIAGNOSIS — Z96652 Presence of left artificial knee joint: Secondary | ICD-10-CM | POA: Diagnosis present

## 2016-09-20 DIAGNOSIS — M1711 Unilateral primary osteoarthritis, right knee: Principal | ICD-10-CM | POA: Diagnosis present

## 2016-09-20 DIAGNOSIS — Z981 Arthrodesis status: Secondary | ICD-10-CM | POA: Diagnosis not present

## 2016-09-20 DIAGNOSIS — E039 Hypothyroidism, unspecified: Secondary | ICD-10-CM | POA: Diagnosis present

## 2016-09-20 DIAGNOSIS — I359 Nonrheumatic aortic valve disorder, unspecified: Secondary | ICD-10-CM | POA: Diagnosis present

## 2016-09-20 DIAGNOSIS — E785 Hyperlipidemia, unspecified: Secondary | ICD-10-CM | POA: Diagnosis present

## 2016-09-20 DIAGNOSIS — F329 Major depressive disorder, single episode, unspecified: Secondary | ICD-10-CM | POA: Diagnosis present

## 2016-09-20 DIAGNOSIS — I2510 Atherosclerotic heart disease of native coronary artery without angina pectoris: Secondary | ICD-10-CM | POA: Diagnosis present

## 2016-09-20 DIAGNOSIS — Z9011 Acquired absence of right breast and nipple: Secondary | ICD-10-CM | POA: Diagnosis not present

## 2016-09-20 DIAGNOSIS — I5032 Chronic diastolic (congestive) heart failure: Secondary | ICD-10-CM | POA: Diagnosis present

## 2016-09-20 DIAGNOSIS — Z90710 Acquired absence of both cervix and uterus: Secondary | ICD-10-CM

## 2016-09-20 DIAGNOSIS — Z955 Presence of coronary angioplasty implant and graft: Secondary | ICD-10-CM

## 2016-09-20 DIAGNOSIS — M25561 Pain in right knee: Secondary | ICD-10-CM | POA: Diagnosis present

## 2016-09-20 DIAGNOSIS — I110 Hypertensive heart disease with heart failure: Secondary | ICD-10-CM | POA: Diagnosis present

## 2016-09-20 HISTORY — PX: TOTAL KNEE ARTHROPLASTY: SHX125

## 2016-09-20 LAB — GLUCOSE, CAPILLARY: Glucose-Capillary: 98 mg/dL (ref 65–99)

## 2016-09-20 SURGERY — ARTHROPLASTY, KNEE, TOTAL
Anesthesia: General | Laterality: Right

## 2016-09-20 MED ORDER — HYDROCODONE-ACETAMINOPHEN 5-325 MG PO TABS
1.0000 | ORAL_TABLET | ORAL | Status: DC | PRN
Start: 2016-09-20 — End: 2016-09-23
  Administered 2016-09-20 – 2016-09-21 (×5): 2 via ORAL
  Administered 2016-09-21: 1 via ORAL
  Administered 2016-09-22: 2 via ORAL
  Administered 2016-09-22: 1 via ORAL
  Filled 2016-09-20: qty 1
  Filled 2016-09-20: qty 2
  Filled 2016-09-20: qty 1
  Filled 2016-09-20 (×4): qty 2

## 2016-09-20 MED ORDER — CEFAZOLIN SODIUM-DEXTROSE 2-4 GM/100ML-% IV SOLN
2.0000 g | INTRAVENOUS | Status: AC
  Administered 2016-09-20: 2 g via INTRAVENOUS

## 2016-09-20 MED ORDER — SUCCINYLCHOLINE CHLORIDE 200 MG/10ML IV SOSY
PREFILLED_SYRINGE | INTRAVENOUS | Status: AC
  Filled 2016-09-20: qty 10

## 2016-09-20 MED ORDER — DOCUSATE SODIUM 100 MG PO CAPS
100.0000 mg | ORAL_CAPSULE | Freq: Two times a day (BID) | ORAL | Status: DC
  Administered 2016-09-20 – 2016-09-23 (×6): 100 mg via ORAL
  Filled 2016-09-20 (×6): qty 1

## 2016-09-20 MED ORDER — LACTATED RINGERS IV SOLN
INTRAVENOUS | Status: DC
  Administered 2016-09-20 (×2): via INTRAVENOUS

## 2016-09-20 MED ORDER — MEPERIDINE HCL 25 MG/ML IJ SOLN: 6.2500 mg | INTRAMUSCULAR | Status: DC | PRN

## 2016-09-20 MED ORDER — ASPIRIN EC 325 MG PO TBEC
325.0000 mg | DELAYED_RELEASE_TABLET | Freq: Two times a day (BID) | ORAL | Status: DC
  Administered 2016-09-21 – 2016-09-23 (×4): 325 mg via ORAL
  Filled 2016-09-20 (×4): qty 1

## 2016-09-20 MED ORDER — LACTATED RINGERS IV SOLN: INTRAVENOUS | Status: DC

## 2016-09-20 MED ORDER — HYDROMORPHONE HCL 1 MG/ML IJ SOLN
INTRAMUSCULAR | Status: AC
  Filled 2016-09-20: qty 0.5

## 2016-09-20 MED ORDER — MIDAZOLAM HCL 2 MG/2ML IJ SOLN
INTRAMUSCULAR | Status: AC
  Filled 2016-09-20: qty 2

## 2016-09-20 MED ORDER — FENTANYL CITRATE (PF) 100 MCG/2ML IJ SOLN
INTRAMUSCULAR | Status: AC
  Administered 2016-09-20: 50 ug via INTRAVENOUS
  Filled 2016-09-20: qty 2

## 2016-09-20 MED ORDER — ACETAMINOPHEN 650 MG RE SUPP: 650.0000 mg | Freq: Four times a day (QID) | RECTAL | Status: DC | PRN

## 2016-09-20 MED ORDER — TRANEXAMIC ACID 1000 MG/10ML IV SOLN
1000.0000 mg | INTRAVENOUS | Status: AC
  Administered 2016-09-20: 1000 mg via INTRAVENOUS
  Filled 2016-09-20: qty 10

## 2016-09-20 MED ORDER — METHOCARBAMOL 500 MG PO TABS
ORAL_TABLET | ORAL | Status: AC
  Administered 2016-09-20: 500 mg via ORAL
  Filled 2016-09-20: qty 1

## 2016-09-20 MED ORDER — PROMETHAZINE HCL 25 MG/ML IJ SOLN: 6.2500 mg | INTRAMUSCULAR | Status: DC | PRN

## 2016-09-20 MED ORDER — PHENYLEPHRINE HCL 10 MG/ML IJ SOLN
INTRAMUSCULAR | Status: DC | PRN
  Administered 2016-09-20 (×2): 120 ug via INTRAVENOUS
  Administered 2016-09-20: 160 ug via INTRAVENOUS

## 2016-09-20 MED ORDER — METOPROLOL SUCCINATE ER 50 MG PO TB24
50.0000 mg | ORAL_TABLET | Freq: Every day | ORAL | Status: DC
  Administered 2016-09-20 – 2016-09-23 (×4): 50 mg via ORAL
  Filled 2016-09-20 (×4): qty 1

## 2016-09-20 MED ORDER — VENLAFAXINE HCL ER 150 MG PO CP24
150.0000 mg | ORAL_CAPSULE | Freq: Two times a day (BID) | ORAL | Status: DC
  Administered 2016-09-20 – 2016-09-23 (×6): 150 mg via ORAL
  Filled 2016-09-20 (×6): qty 1

## 2016-09-20 MED ORDER — BISACODYL 5 MG PO TBEC: 5.0000 mg | DELAYED_RELEASE_TABLET | Freq: Every day | ORAL | Status: DC | PRN

## 2016-09-20 MED ORDER — SODIUM CHLORIDE 0.9 % IJ SOLN
INTRAMUSCULAR | Status: DC | PRN
  Administered 2016-09-20: 40 mL via INTRAVENOUS

## 2016-09-20 MED ORDER — HYDROCHLOROTHIAZIDE 12.5 MG PO CAPS
12.5000 mg | ORAL_CAPSULE | Freq: Every day | ORAL | Status: DC
  Administered 2016-09-20 – 2016-09-23 (×4): 12.5 mg via ORAL
  Filled 2016-09-20 (×4): qty 1

## 2016-09-20 MED ORDER — CHLORHEXIDINE GLUCONATE 4 % EX LIQD: 60.0000 mL | Freq: Once | CUTANEOUS | Status: DC

## 2016-09-20 MED ORDER — CEFAZOLIN SODIUM-DEXTROSE 2-4 GM/100ML-% IV SOLN
2.0000 g | Freq: Four times a day (QID) | INTRAVENOUS | Status: AC
  Administered 2016-09-20: 2 g via INTRAVENOUS
  Filled 2016-09-20 (×2): qty 100

## 2016-09-20 MED ORDER — SODIUM CHLORIDE 0.9 % IV SOLN
2000.0000 mg | Freq: Once | INTRAVENOUS | Status: DC
  Filled 2016-09-20: qty 20

## 2016-09-20 MED ORDER — CEFAZOLIN SODIUM-DEXTROSE 2-4 GM/100ML-% IV SOLN
INTRAVENOUS | Status: AC
  Filled 2016-09-20: qty 100

## 2016-09-20 MED ORDER — ONDANSETRON HCL 4 MG/2ML IJ SOLN
INTRAMUSCULAR | Status: DC | PRN
  Administered 2016-09-20: 4 mg via INTRAVENOUS

## 2016-09-20 MED ORDER — FENTANYL CITRATE (PF) 100 MCG/2ML IJ SOLN
INTRAMUSCULAR | Status: AC
  Filled 2016-09-20: qty 2

## 2016-09-20 MED ORDER — ONDANSETRON HCL 4 MG PO TABS: 4.0000 mg | ORAL_TABLET | Freq: Four times a day (QID) | ORAL | Status: DC | PRN

## 2016-09-20 MED ORDER — METOCLOPRAMIDE HCL 5 MG PO TABS: 5.0000 mg | ORAL_TABLET | Freq: Three times a day (TID) | ORAL | Status: DC | PRN

## 2016-09-20 MED ORDER — ACETAMINOPHEN 325 MG PO TABS: 650.0000 mg | ORAL_TABLET | Freq: Four times a day (QID) | ORAL | Status: DC | PRN

## 2016-09-20 MED ORDER — PHENYLEPHRINE 40 MCG/ML (10ML) SYRINGE FOR IV PUSH (FOR BLOOD PRESSURE SUPPORT)
PREFILLED_SYRINGE | INTRAVENOUS | Status: AC
  Filled 2016-09-20: qty 10

## 2016-09-20 MED ORDER — MENTHOL 3 MG MT LOZG: 1.0000 | LOZENGE | OROMUCOSAL | Status: DC | PRN

## 2016-09-20 MED ORDER — FENTANYL CITRATE (PF) 100 MCG/2ML IJ SOLN
25.0000 ug | INTRAMUSCULAR | Status: DC | PRN
  Administered 2016-09-20 (×2): 50 ug via INTRAVENOUS

## 2016-09-20 MED ORDER — ROPIVACAINE HCL 7.5 MG/ML IJ SOLN
INTRAMUSCULAR | Status: DC | PRN
  Administered 2016-09-20: 20 mL via PERINEURAL

## 2016-09-20 MED ORDER — EPHEDRINE SULFATE 50 MG/ML IJ SOLN
INTRAMUSCULAR | Status: DC | PRN
  Administered 2016-09-20: 15 mg via INTRAVENOUS
  Administered 2016-09-20: 10 mg via INTRAVENOUS
  Administered 2016-09-20: 15 mg via INTRAVENOUS
  Administered 2016-09-20: 10 mg via INTRAVENOUS

## 2016-09-20 MED ORDER — 0.9 % SODIUM CHLORIDE (POUR BTL) OPTIME
TOPICAL | Status: DC | PRN
  Administered 2016-09-20: 1000 mL

## 2016-09-20 MED ORDER — METOPROLOL SUCCINATE ER 50 MG PO TB24: 50.0000 mg | ORAL_TABLET | Freq: Every day | ORAL | Status: DC

## 2016-09-20 MED ORDER — DIPHENHYDRAMINE HCL 12.5 MG/5ML PO ELIX: 12.5000 mg | ORAL_SOLUTION | ORAL | Status: DC | PRN

## 2016-09-20 MED ORDER — PHENOL 1.4 % MT LIQD: 1.0000 | OROMUCOSAL | Status: DC | PRN

## 2016-09-20 MED ORDER — DEXTROSE 5 % IV SOLN
500.0000 mg | Freq: Four times a day (QID) | INTRAVENOUS | Status: DC | PRN
  Filled 2016-09-20: qty 5

## 2016-09-20 MED ORDER — FENTANYL CITRATE (PF) 100 MCG/2ML IJ SOLN
INTRAMUSCULAR | Status: DC | PRN
  Administered 2016-09-20 (×2): 50 ug via INTRAVENOUS

## 2016-09-20 MED ORDER — LIDOCAINE HCL (CARDIAC) 20 MG/ML IV SOLN
INTRAVENOUS | Status: DC | PRN
  Administered 2016-09-20: 60 mg via INTRAVENOUS

## 2016-09-20 MED ORDER — BUPIVACAINE LIPOSOME 1.3 % IJ SUSP
20.0000 mL | INTRAMUSCULAR | Status: DC
  Filled 2016-09-20: qty 20

## 2016-09-20 MED ORDER — METOPROLOL TARTRATE 50 MG PO TABS
ORAL_TABLET | ORAL | Status: AC
  Administered 2016-09-20: 50 mg
  Filled 2016-09-20: qty 1

## 2016-09-20 MED ORDER — PROPOFOL 10 MG/ML IV BOLUS
INTRAVENOUS | Status: DC | PRN
Start: 2016-09-20 — End: 2016-09-20
  Administered 2016-09-20: 20 mg via INTRAVENOUS
  Administered 2016-09-20: 10 mg via INTRAVENOUS
  Administered 2016-09-20: 20 mg via INTRAVENOUS
  Administered 2016-09-20: 130 mg via INTRAVENOUS
  Administered 2016-09-20: 20 mg via INTRAVENOUS

## 2016-09-20 MED ORDER — ALUM & MAG HYDROXIDE-SIMETH 200-200-20 MG/5ML PO SUSP: 30.0000 mL | ORAL | Status: DC | PRN

## 2016-09-20 MED ORDER — HYDROMORPHONE HCL 1 MG/ML IJ SOLN
0.5000 mg | INTRAMUSCULAR | Status: DC | PRN
  Administered 2016-09-20: 0.5 mg via INTRAVENOUS

## 2016-09-20 MED ORDER — BUPIVACAINE-EPINEPHRINE (PF) 0.5% -1:200000 IJ SOLN
INTRAMUSCULAR | Status: DC | PRN
  Administered 2016-09-20: 20 mL via PERINEURAL

## 2016-09-20 MED ORDER — LEVOTHYROXINE SODIUM 50 MCG PO TABS
50.0000 ug | ORAL_TABLET | Freq: Every day | ORAL | Status: DC
  Administered 2016-09-21 – 2016-09-23 (×4): 50 ug via ORAL
  Filled 2016-09-20 (×3): qty 1

## 2016-09-20 MED ORDER — LIDOCAINE 2% (20 MG/ML) 5 ML SYRINGE
INTRAMUSCULAR | Status: AC
  Filled 2016-09-20: qty 5

## 2016-09-20 MED ORDER — TRANEXAMIC ACID 1000 MG/10ML IV SOLN
1000.0000 mg | Freq: Once | INTRAVENOUS | Status: AC
  Administered 2016-09-20: 1000 mg via INTRAVENOUS
  Filled 2016-09-20: qty 10

## 2016-09-20 MED ORDER — HYDROMORPHONE HCL 2 MG/ML IJ SOLN
0.5000 mg | INTRAMUSCULAR | Status: DC | PRN
  Administered 2016-09-20 – 2016-09-21 (×3): 1 mg via INTRAVENOUS
  Filled 2016-09-20 (×3): qty 1

## 2016-09-20 MED ORDER — EPHEDRINE 5 MG/ML INJ
INTRAVENOUS | Status: AC
  Filled 2016-09-20: qty 10

## 2016-09-20 MED ORDER — TRAZODONE HCL 50 MG PO TABS
225.0000 mg | ORAL_TABLET | Freq: Every day | ORAL | Status: DC
  Administered 2016-09-20 – 2016-09-22 (×3): 225 mg via ORAL
  Filled 2016-09-20 (×3): qty 2

## 2016-09-20 MED ORDER — ONDANSETRON HCL 4 MG/2ML IJ SOLN: 4.0000 mg | Freq: Four times a day (QID) | INTRAMUSCULAR | Status: DC | PRN

## 2016-09-20 MED ORDER — FENTANYL CITRATE (PF) 100 MCG/2ML IJ SOLN
50.0000 ug | Freq: Once | INTRAMUSCULAR | Status: AC
  Administered 2016-09-20: 50 ug via INTRAVENOUS
  Filled 2016-09-20: qty 1

## 2016-09-20 MED ORDER — LOSARTAN POTASSIUM 50 MG PO TABS
100.0000 mg | ORAL_TABLET | Freq: Every day | ORAL | Status: DC
  Administered 2016-09-20 – 2016-09-23 (×4): 100 mg via ORAL
  Filled 2016-09-20 (×4): qty 2

## 2016-09-20 MED ORDER — ISOSORBIDE MONONITRATE ER 60 MG PO TB24
120.0000 mg | ORAL_TABLET | Freq: Every day | ORAL | Status: DC
  Administered 2016-09-21 – 2016-09-23 (×3): 120 mg via ORAL
  Filled 2016-09-20 (×3): qty 2

## 2016-09-20 MED ORDER — METOCLOPRAMIDE HCL 5 MG/ML IJ SOLN: 5.0000 mg | Freq: Three times a day (TID) | INTRAMUSCULAR | Status: DC | PRN

## 2016-09-20 MED ORDER — METHOCARBAMOL 500 MG PO TABS
500.0000 mg | ORAL_TABLET | Freq: Four times a day (QID) | ORAL | Status: DC | PRN
  Administered 2016-09-20 – 2016-09-21 (×2): 500 mg via ORAL
  Filled 2016-09-20: qty 1

## 2016-09-20 MED ORDER — HYDROCODONE-ACETAMINOPHEN 5-325 MG PO TABS
ORAL_TABLET | ORAL | Status: AC
  Administered 2016-09-20: 2 via ORAL
  Filled 2016-09-20: qty 2

## 2016-09-20 MED ORDER — EZETIMIBE-SIMVASTATIN 10-40 MG PO TABS
1.0000 | ORAL_TABLET | Freq: Every day | ORAL | Status: DC
  Administered 2016-09-20 – 2016-09-22 (×3): 1 via ORAL
  Filled 2016-09-20 (×4): qty 1

## 2016-09-20 MED ORDER — BUPIVACAINE LIPOSOME 1.3 % IJ SUSP
INTRAMUSCULAR | Status: DC | PRN
  Administered 2016-09-20: 20 mL

## 2016-09-20 SURGICAL SUPPLY — 58 items
BAG DECANTER FOR FLEXI CONT (MISCELLANEOUS) ×3 IMPLANT
BANDAGE ACE 4X5 VEL STRL LF (GAUZE/BANDAGES/DRESSINGS) IMPLANT
BANDAGE ESMARK 6X9 LF (GAUZE/BANDAGES/DRESSINGS) ×1 IMPLANT
BLADE SAGITTAL 25.0X1.19X90 (BLADE) ×2 IMPLANT
BLADE SAGITTAL 25.0X1.19X90MM (BLADE) ×1
BLADE SAW SGTL 13.0X1.19X90.0M (BLADE) IMPLANT
BNDG ELASTIC 6X10 VLCR STRL LF (GAUZE/BANDAGES/DRESSINGS) ×3 IMPLANT
BNDG ESMARK 6X9 LF (GAUZE/BANDAGES/DRESSINGS) ×3
BNDG GAUZE ELAST 4 BULKY (GAUZE/BANDAGES/DRESSINGS) ×6 IMPLANT
BOWL SMART MIX CTS (DISPOSABLE) ×3 IMPLANT
CAP KNEE TOTAL 3 SIGMA ×3 IMPLANT
CEMENT HV SMART SET (Cement) ×6 IMPLANT
CLOSURE WOUND 1/2 X4 (GAUZE/BANDAGES/DRESSINGS) ×1
COVER SURGICAL LIGHT HANDLE (MISCELLANEOUS) ×3 IMPLANT
CUFF TOURNIQUET SINGLE 34IN LL (TOURNIQUET CUFF) ×3 IMPLANT
CUFF TOURNIQUET SINGLE 44IN (TOURNIQUET CUFF) IMPLANT
DECANTER SPIKE VIAL GLASS SM (MISCELLANEOUS) ×3 IMPLANT
DRAPE EXTREMITY T 121X128X90 (DRAPE) ×3 IMPLANT
DRAPE PROXIMA HALF (DRAPES) ×6 IMPLANT
DRAPE U-SHAPE 47X51 STRL (DRAPES) ×3 IMPLANT
DRSG ADAPTIC 3X8 NADH LF (GAUZE/BANDAGES/DRESSINGS) ×3 IMPLANT
DRSG PAD ABDOMINAL 8X10 ST (GAUZE/BANDAGES/DRESSINGS) ×6 IMPLANT
DURAPREP 26ML APPLICATOR (WOUND CARE) ×3 IMPLANT
ELECT REM PT RETURN 9FT ADLT (ELECTROSURGICAL) ×3
ELECTRODE REM PT RTRN 9FT ADLT (ELECTROSURGICAL) ×1 IMPLANT
GAUZE SPONGE 4X4 12PLY STRL (GAUZE/BANDAGES/DRESSINGS) ×3 IMPLANT
GLOVE BIO SURGEON STRL SZ8 (GLOVE) ×6 IMPLANT
GLOVE BIOGEL PI IND STRL 8 (GLOVE) ×2 IMPLANT
GLOVE BIOGEL PI INDICATOR 8 (GLOVE) ×4
GOWN STRL REUS W/ TWL LRG LVL3 (GOWN DISPOSABLE) ×1 IMPLANT
GOWN STRL REUS W/ TWL XL LVL3 (GOWN DISPOSABLE) ×2 IMPLANT
GOWN STRL REUS W/TWL LRG LVL3 (GOWN DISPOSABLE) ×2
GOWN STRL REUS W/TWL XL LVL3 (GOWN DISPOSABLE) ×4
HANDPIECE INTERPULSE COAX TIP (DISPOSABLE) ×2
HOOD PEEL AWAY FACE SHEILD DIS (HOOD) ×6 IMPLANT
IMMOBILIZER KNEE 22 UNIV (SOFTGOODS) ×3 IMPLANT
KIT BASIN OR (CUSTOM PROCEDURE TRAY) ×3 IMPLANT
KIT ROOM TURNOVER OR (KITS) ×3 IMPLANT
MANIFOLD NEPTUNE II (INSTRUMENTS) ×3 IMPLANT
NEEDLE HYPO 21X1 ECLIPSE (NEEDLE) ×3 IMPLANT
NS IRRIG 1000ML POUR BTL (IV SOLUTION) ×3 IMPLANT
PACK TOTAL JOINT (CUSTOM PROCEDURE TRAY) ×3 IMPLANT
PAD ARMBOARD 7.5X6 YLW CONV (MISCELLANEOUS) ×6 IMPLANT
SET HNDPC FAN SPRY TIP SCT (DISPOSABLE) ×1 IMPLANT
STRIP CLOSURE SKIN 1/2X4 (GAUZE/BANDAGES/DRESSINGS) ×2 IMPLANT
SUT MNCRL AB 3-0 PS2 18 (SUTURE) ×3 IMPLANT
SUT STRATAFIX 0 PDS 27 VIOLET (SUTURE) ×3
SUT VIC AB 0 CT1 27 (SUTURE) ×4
SUT VIC AB 0 CT1 27XBRD ANBCTR (SUTURE) ×2 IMPLANT
SUT VIC AB 2-0 CT1 27 (SUTURE) ×4
SUT VIC AB 2-0 CT1 TAPERPNT 27 (SUTURE) ×2 IMPLANT
SUT VLOC 180 0 24IN GS25 (SUTURE) ×3 IMPLANT
SUTURE STRATFX 0 PDS 27 VIOLET (SUTURE) ×1 IMPLANT
SYR 50ML LL SCALE MARK (SYRINGE) ×3 IMPLANT
TOWEL OR 17X24 6PK STRL BLUE (TOWEL DISPOSABLE) ×3 IMPLANT
TOWEL OR 17X26 10 PK STRL BLUE (TOWEL DISPOSABLE) ×3 IMPLANT
TRAY CATH 16FR W/PLASTIC CATH (SET/KITS/TRAYS/PACK) IMPLANT
TRAY FOLEY BAG SILVER LF 16FR (SET/KITS/TRAYS/PACK) ×3 IMPLANT

## 2016-09-20 NOTE — Anesthesia Preprocedure Evaluation (Addendum)
Anesthesia Evaluation  Patient identified by MRN, date of birth, ID band Patient awake    Reviewed: Allergy & Precautions, NPO status , Patient's Chart, lab work & pertinent test results, reviewed documented beta blocker date and time   Airway Mallampati: I       Dental  (+) Upper Dentures, Dental Advisory Given   Pulmonary neg pulmonary ROS,    breath sounds clear to auscultation       Cardiovascular hypertension, Pt. on medications and Pt. on home beta blockers + angina + CAD and + Cardiac Stents   Rhythm:Regular Rate:Normal     Neuro/Psych PSYCHIATRIC DISORDERS Anxiety Depression  Neuromuscular disease    GI/Hepatic Neg liver ROS, GERD  ,  Endo/Other  diabetes, Type 2Hypothyroidism   Renal/GU negative Renal ROS  negative genitourinary   Musculoskeletal  (+) Arthritis , Osteoarthritis,    Abdominal   Peds negative pediatric ROS (+)  Hematology negative hematology ROS (+)   Anesthesia Other Findings - HLD  Reproductive/Obstetrics negative OB ROS                            Anesthesia Physical Anesthesia Plan  ASA: III  Anesthesia Plan: General   Post-op Pain Management:  Regional for Post-op pain   Induction: Intravenous  Airway Management Planned: LMA  Additional Equipment:   Intra-op Plan:   Post-operative Plan: Extubation in OR  Informed Consent: I have reviewed the patients History and Physical, chart, labs and discussed the procedure including the risks, benefits and alternatives for the proposed anesthesia with the patient or authorized representative who has indicated his/her understanding and acceptance.   Dental advisory given  Plan Discussed with: CRNA  Anesthesia Plan Comments:        Anesthesia Quick Evaluation

## 2016-09-20 NOTE — Anesthesia Postprocedure Evaluation (Addendum)
Anesthesia Post Note  Patient: [name] [name]  Procedure(s) Performed: Procedure(s) (LRB): TOTAL KNEE ARTHROPLASTY (Right)  Patient location during evaluation: PACU Anesthesia Type: General Level of consciousness: awake and alert Pain management: pain level controlled Vital Signs Assessment: post-procedure vital signs reviewed and stable Respiratory status: spontaneous breathing, nonlabored ventilation, respiratory function stable and patient connected to nasal cannula oxygen Cardiovascular status: blood pressure returned to baseline and stable Postop Assessment: no signs of nausea or vomiting Anesthetic complications: no Comments: Pain medication administered.        Last Vitals:  Vitals:   09/20/16 1455 09/20/16 1505  BP: (!) 128/57 120/68  Pulse: 63 62  Resp: 16 14  Temp:      Last Pain:  Vitals:   09/20/16 1310  TempSrc:   PainSc: [place]

## 2016-09-20 NOTE — Transfer of Care (Signed)
Immediate Anesthesia Transfer of Care Note  Patient: [name] [name]  Procedure(s) Performed: Procedure(s): TOTAL KNEE ARTHROPLASTY (Right)  Patient Location: PACU  Anesthesia Type:General  Level of Consciousness: awake, alert , oriented and sedated  Airway & Oxygen Therapy: Patient Spontanous Breathing and Patient connected to nasal cannula oxygen  Post-op Assessment: Report given to RN, Post -op Vital signs reviewed and stable and Patient moving all extremities  Post vital signs: Reviewed and stable  Last Vitals:  Vitals:   09/20/16 0940 09/20/16 0945  BP: (!) 142/78 (!) 137/119  Pulse: (!) 47 65  Resp: 15 17  Temp:      Last Pain:  Vitals:   09/20/16 0843  TempSrc:   PainSc: 0-No pain      Patients Stated Pain Goal: 3 ([id number])  Complications: No apparent anesthesia complications

## 2016-09-20 NOTE — Interval H&P Note (Signed)
History and Physical Interval Note:  09/20/2016 9:27 AM  [name] [name]  has presented today for surgery, with the diagnosis of RIGHT KNEE DEGENERATIVE JOINT DISEASE  The various methods of treatment have been discussed with the patient and family. After consideration of risks, benefits and other options for treatment, the patient has consented to  Procedure(s): TOTAL KNEE ARTHROPLASTY (Right) as a surgical intervention .  The patient's history has been reviewed, patient examined, no change in status, stable for surgery.  I have reviewed the patient's chart and labs.  Questions were answered to the patient's satisfaction.     [name]

## 2016-09-20 NOTE — Interval H&P Note (Signed)
History and Physical Interval Note:  09/20/2016 12:14 PM  [name] [name]  has presented today for surgery, with the diagnosis of RIGHT KNEE DEGENERATIVE JOINT DISEASE  The various methods of treatment have been discussed with the patient and family. After consideration of risks, benefits and other options for treatment, the patient has consented to  Procedure(s): TOTAL KNEE ARTHROPLASTY (Right) as a surgical intervention .  The patient's history has been reviewed, patient examined, no change in status, stable for surgery.  I have reviewed the patient's chart and labs.  Questions were answered to the patient's satisfaction.     Donoven Pett G

## 2016-09-20 NOTE — Anesthesia Procedure Notes (Signed)
Procedure Name: LMA Insertion Date/Time: 09/20/2016 10:47 AM Performed by: Scheryl Darter Pre-anesthesia Checklist: Patient identified, Emergency Drugs available, Suction available and Patient being monitored Patient Re-evaluated:Patient Re-evaluated prior to inductionOxygen Delivery Method: Circle System Utilized Preoxygenation: Pre-oxygenation with 100% oxygen Intubation Type: IV induction Ventilation: Mask ventilation without difficulty LMA: LMA inserted LMA Size: 4.0 Number of attempts: 1 Airway Equipment and Method: Bite block Placement Confirmation: positive ETCO2 Tube secured with: Tape Dental Injury: Teeth and Oropharynx as per pre-operative assessment

## 2016-09-20 NOTE — Op Note (Signed)
PREOP DIAGNOSIS: DJD RIGHT KNEE POSTOP DIAGNOSIS: same PROCEDURE: RIGHT TKR ANESTHESIA: General and Block and MAC ATTENDING SURGEON: [name] [name] ASSISTANTLoni Dolly PA  INDICATIONS FOR PROCEDURE: [name] [name] is a 83 y.o. female who has struggled for a long time with pain due to degenerative arthritis of the right knee.  The patient has failed many conservative non-operative measures and at this point has pain which limits the ability to sleep and walk.  The patient is offered total knee replacement.  Informed operative consent was obtained after discussion of possible risks of anesthesia, infection, neurovascular injury, DVT, and death.  The importance of the post-operative rehabilitation protocol to optimize result was stressed extensively with the patient.  SUMMARY OF FINDINGS AND PROCEDURE:  [name] [name] was taken to the operative suite where under the above anesthesia a right knee replacement was performed.  There were advanced degenerative changes and the bone quality was good.  We used the DePuy LCS system and placed size standard femur, 3 tibia, 35 mm all polyethylene patella, and a size 10 mm spacer.  [name] [name] assisted throughout and was invaluable to the completion of the case in that he helped retract and maintain exposure while I placed components.  He also helped close thereby minimizing OR time.  The patient was admitted for appropriate post-op care to include perioperative antibiotics and mechanical and pharmacologic measures for DVT prophylaxis.  DESCRIPTION OF PROCEDURE:  [name] [name] was taken to the operative suite where the above anesthesia was applied.  The patient was positioned supine and prepped and draped in normal sterile fashion.  An appropriate time out was performed.  After the administration of kefzol pre-op antibiotic the leg was elevated and exsanguinated and a tourniquet inflated. A standard longitudinal incision was made on the anterior knee.  Dissection  was carried down to the extensor mechanism.  All appropriate anti-infective measures were used including the pre-operative antibiotic, betadine impregnated drape, and closed hooded exhaust systems for each member of the surgical team.  A medial parapatellar incision was made in the extensor mechanism and the knee cap flipped and the knee flexed.  Some residual meniscal tissues were removed along with any remaining ACL/PCL tissue.  A guide was placed on the tibia and a flat cut was made on it's superior surface.  An intramedullary guide was placed in the femur and was utilized to make anterior and posterior cuts creating an appropriate flexion gap.  A second intramedullary guide was placed in the femur to make a distal cut properly balancing the knee with an extension gap equal to the flexion gap.  The three bones sized to the above mentioned sizes and the appropriate guides were placed and utilized.  A trial reduction was done and the knee easily came to full extension and the patella tracked well on flexion.  The trial components were removed and all bones were cleaned with pulsatile lavage and then dried thoroughly.  Cement was mixed and was pressurized onto the bones followed by placement of the aforementioned components.  Excess cement was trimmed and pressure was held on the components until the cement had hardened.  The tourniquet was deflated and a small amount of bleeding was controlled with cautery and pressure.  The knee was irrigated thoroughly.  The extensor mechanism was re-approximated with V-loc suture in running fashion.  The knee was flexed and the repair was solid.  The subcutaneous tissues were re-approximated with #0 and #2-0 vicryl and the skin closed  with a subcuticular stitch and steristrips.  A sterile dressing was applied.  Intraoperative fluids, EBL, and tourniquet time can be obtained from anesthesia records.  DISPOSITION:  The patient was taken to recovery room in stable condition and  admitted for appropriate post-op care to include peri-operative antibiotic and DVT prophylaxis with mechanical and pharmacologic measures.  Khaleah Duer [name] 09/20/2016, 3:12 PM

## 2016-09-20 NOTE — Anesthesia Procedure Notes (Addendum)
Anesthesia Regional Block:  Adductor canal block  Pre-Anesthetic Checklist: ,, timeout performed, Correct Patient, Correct Site, Correct Laterality, Correct Procedure, Correct Position, site marked, Risks and benefits discussed,  Surgical consent,  Pre-op evaluation,  At surgeon's request and post-op pain management  Laterality: Right  Prep: chloraprep       Needles:  Injection technique: Single-shot  Needle Type: Echogenic Needle     Needle Length: 9cm 9 cm Needle Gauge: 21 and 21 G    Additional Needles:  Procedures: ultrasound guided (picture in chart) Adductor canal block Narrative:  Start time: 09/20/2016 9:40 AM End time: 09/20/2016 9:45 AM Injection made incrementally with aspirations every 5 mL.  Performed by: Personally  Anesthesiologist: Suella Broad D  Additional Notes: Pt tolerated well.

## 2016-09-20 NOTE — Progress Notes (Signed)
Orthopedic Tech Progress Note Patient Details:  [name] [name] November 06, 1933 [id number]  CPM Right Knee CPM Right Knee: On Right Knee Flexion (Degrees): 90 Right Knee Extension (Degrees): 0 Additional Comments: foot roll   [name] 09/20/2016, 1:49 PM

## 2016-09-21 ENCOUNTER — Encounter (HOSPITAL_COMMUNITY): Payer: Self-pay | Admitting: Orthopaedic Surgery

## 2016-09-21 MED ORDER — METHOCARBAMOL 750 MG PO TABS
750.0000 mg | ORAL_TABLET | Freq: Four times a day (QID) | ORAL | Status: DC | PRN
  Administered 2016-09-21 – 2016-09-22 (×3): 750 mg via ORAL
  Filled 2016-09-21 (×3): qty 1

## 2016-09-21 NOTE — Progress Notes (Signed)
Subjective: 1 Day Post-Op Procedure(s) (LRB): TOTAL KNEE ARTHROPLASTY (Right)  Activity level:  wbat Diet tolerance:  ok Voiding:  Foley out last night Patient reports pain as moderate.    Objective: Vital signs in last 24 hours: Temp:  [97.3 F (36.3 C)-98.3 F (36.8 C)] 97.4 F (36.3 C) (02/06 1808) Pulse Rate:  [47-74] 66 (02/06 2020) Resp:  [14-20] 18 (02/06 1808) BP: (103-148)/(57-119) 146/84 (02/06 2020) SpO2:  [95 %-100 %] 98 % (02/06 1808)  Labs: No results for input(s): HGB in the last 72 hours. No results for input(s): WBC, RBC, HCT, PLT in the last 72 hours. No results for input(s): NA, K, CL, CO2, BUN, CREATININE, GLUCOSE, CALCIUM in the last 72 hours. No results for input(s): LABPT, INR in the last 72 hours.  Physical Exam:  Neurologically intact ABD soft Neurovascular intact Sensation intact distally Intact pulses distally Dorsiflexion/Plantar flexion intact Incision: dressing C/D/I and no drainage No cellulitis present Compartment soft  Assessment/Plan:  1 Day Post-Op Procedure(s) (LRB): TOTAL KNEE ARTHROPLASTY (Right) Advance diet Up with therapy D/C IV fluids Plan for discharge tomorrow Discharge home with home health if doing well with PT. Follow up in office 2 weeks post op. I will change dressing to aquacel tomorrow. Continue on ASA 325mg  BID x 2 weeks post op. We increased robaxin to 750mg  to help with spasm.   Tysin Salada, Larwance Sachs 09/21/2016, 7:48 AM

## 2016-09-21 NOTE — Progress Notes (Signed)
Physical Therapy Treatment Patient Details Name: [name] [name] MRN: [id number] DOB: Aug 28, 1933 Today's Date: 09/21/2016    History of Present Illness Patient is an 83 yo female admitted 09/21/16 now s/p Rt TKA.    PMH:  OA, CHF, HTN, HLD, CAD, anxiety, depression, DM, Lt TKA 09/2015    PT Comments    Patient making slow progress with mobility, primarily limited by pain.  Able to move to EOB this pm.  Continue to recommend SNF at this time.  Follow Up Recommendations  SNF;Supervision/Assistance - 24 hour     Equipment Recommendations  None recommended by PT    Recommendations for Other Services       Precautions / Restrictions Precautions Precautions: Knee Precaution Booklet Issued: Yes (comment) Precaution Comments: Reviewed knee precautions Required Braces or Orthoses: Knee Immobilizer - Right Knee Immobilizer - Right: On when out of bed or walking Restrictions Weight Bearing Restrictions: Yes RLE Weight Bearing: Weight bearing as tolerated    Mobility  Bed Mobility Overal bed mobility: Needs Assistance Bed Mobility: Supine to Sit;Sit to Supine Rolling: Mod assist   Supine to sit: Max assist;HOB elevated Sit to supine: Max assist;HOB elevated   General bed mobility comments: Instructed patient and family on donning KI on RLE.  Verbal cues for bed mobility technique.  Required max assist to manage RLE to move off of bed, and to raise trunk to sitting.  Patient required increased time for mobility due to pain.  Patient sat EOB x 8 minutes with occasional min assist for balance.  Returned to supine with max assist to manage trunk and LE's.  Transfers                 General transfer comment: NT - patient unable to attempt stance  Ambulation/Gait                 Stairs            Wheelchair Mobility    Modified Rankin (Stroke Patients Only)       Balance Overall balance assessment: Needs assistance Sitting-balance support: Single extremity  supported;Feet supported Sitting balance-Leahy Scale: Poor Sitting balance - Comments: Patient required UE support and occasional physical assist for balance in sitting. Postural control: Posterior lean                          Cognition Arousal/Alertness: Awake/alert Behavior During Therapy: WFL for tasks assessed/performed;Anxious Overall Cognitive Status: Within Functional Limits for tasks assessed                 General Comments: Less anxious this pm.  Less yelling.    Exercises Total Joint Exercises Ankle Circles/Pumps: AROM;Both;10 reps;Supine Quad Sets: AROM;Both;5 reps;Supine Heel Slides: AAROM;Right;5 reps;Supine    General Comments        Pertinent Vitals/Pain Pain Assessment: 0-10 Pain Score: 8  Pain Location: Rt knee Pain Descriptors / Indicators: Stabbing;Moaning;Operative site guarding Pain Intervention(s): Limited activity within patient's tolerance;Monitored during session;Premedicated before session;Repositioned    Home Living Family/patient expects to be discharged to:: Private residence Living Arrangements: Spouse/significant other Available Help at Discharge: Family;Available 24 hours/day Type of Home: House Home Access: Stairs to enter Entrance Stairs-Rails: None Home Layout: One level Home Equipment: Environmental consultant - 2 wheels;Bedside commode;Grab bars - tub/shower;Shower seat;Other (comment);Wheelchair - manual (Teacher, English as a foreign language)      Prior Function Level of Independence: Independent with assistive device(s)      Comments: Patient reports she uses her RW  at times ("when my Lt knee is acting up")   PT Goals (current goals can now be found in the care plan section) Acute Rehab PT Goals Patient Stated Goal: Decrease pain PT Goal Formulation: With patient Time For Goal Achievement: 09/28/16 Potential to Achieve Goals: Good Progress towards PT goals: Progressing toward goals    Frequency    7X/week      PT Plan Current plan remains  appropriate    Co-evaluation             End of Session   Activity Tolerance: Patient limited by pain Patient left: in bed;with call bell/phone within reach;with family/visitor present     Time: [id number] PT Time Calculation (min) (ACUTE ONLY): 21 min  Charges:  $Therapeutic Activity: 8-22 mins                    G Codes:      Despina Pole 09/28/16, 3:40 PM Carita Pian. Sanjuana Kava, [place] Pager [phone]

## 2016-09-21 NOTE — Evaluation (Signed)
Physical Therapy Evaluation Patient Details Name: [name] [name] MRN: [id number] DOB: 1933-10-30 Today's Date: 09/21/2016   History of Present Illness  Patient is an 83 yo female admitted 09/21/16 now s/p Rt TKA.    PMH:  OA, CHF, HTN, HLD, CAD, anxiety, depression, DM, Lt TKA 09/2015  Clinical Impression  Patient presents with problems listed below.  Will benefit from acute PT to maximize functional mobility prior to discharge.  Patient with severe pain this am - RN provided pain meds, and then patient very lethargic.  Patient able to perform bed mobility and a few RLE exercises. At this point, would recommend ST-SNF for continued therapy at d/c with goal to return home.  Will continue to assess - if patient improves to min assist with mobility, may be able to go home with HHPT.    Follow Up Recommendations SNF;Supervision/Assistance - 24 hour    Equipment Recommendations  None recommended by PT    Recommendations for Other Services       Precautions / Restrictions Precautions Precautions: Knee Precaution Booklet Issued: Yes (comment) Precaution Comments: Reviewed knee precautions Required Braces or Orthoses: Knee Immobilizer - Right Knee Immobilizer - Right: On when out of bed or walking Restrictions Weight Bearing Restrictions: Yes RLE Weight Bearing: Weight bearing as tolerated      Mobility  Bed Mobility Overal bed mobility: Needs Assistance Bed Mobility: Rolling Rolling: Mod assist         General bed mobility comments: Patient requesting bedpan.  Cues to roll to right side.  Patient with use of bedrail, required mod assist to roll to Rt.  Patient yelling with positioning on bedpan.  RN to room for pain meds.  Transfers                 General transfer comment: NT  Ambulation/Gait                Stairs            Wheelchair Mobility    Modified Rankin (Stroke Patients Only)       Balance                                              Pertinent Vitals/Pain Pain Assessment: 0-10 Pain Score: 10-Worst pain ever Pain Location: Rt knee Pain Descriptors / Indicators: Stabbing;Moaning;Operative site guarding (Patient initially yelling with any movement of Rt LE) Pain Intervention(s): Limited activity within patient's tolerance;Monitored during session;Repositioned;Patient requesting pain meds-RN notified;RN gave pain meds during session    [place] expects to be discharged to:: Private residence Living Arrangements: Spouse/significant other Available Help at Discharge: Family;Available 24 hours/day Type of Home: House Home Access: Stairs to enter Entrance Stairs-Rails: None Entrance Stairs-Number of Steps: 1 threshold Home Layout: One level Home Equipment: Walker - 2 wheels;Bedside commode;Grab bars - tub/shower;Shower seat;Other (comment);Wheelchair - manual (Teacher, English as a foreign language)      Prior Function Level of Independence: Independent with assistive device(s)         Comments: Patient reports she uses her RW at times ("when my Lt knee is acting up")     Hand Dominance        Extremity/Trunk Assessment   Upper Extremity Assessment Upper Extremity Assessment: Overall WFL for tasks assessed    Lower Extremity Assessment Lower Extremity Assessment: RLE deficits/detail RLE Deficits / Details: Decreased strength and ROM post-op.  RLE: Unable to fully assess due to pain       Communication   Communication: No difficulties  Cognition Arousal/Alertness: Awake/alert;Lethargic;Suspect due to medications Behavior During Therapy: Anxious Overall Cognitive Status: Within Functional Limits for tasks assessed                 General Comments: Patient very painful on first attempt to work with her.  Moved RLE off of footsie roll and patient yelling.  Encouraged deep breathing and relaxation.  RN provided pain meds.  On second attempt, patient very lethargic assumed from pain meds.     General Comments      Exercises Total Joint Exercises Ankle Circles/Pumps: AROM;Both;10 reps;Supine Quad Sets: AROM;Both;5 reps;Supine Heel Slides: AAROM;Right;5 reps;Supine   Assessment/Plan    PT Assessment Patient needs continued PT services  PT Problem List Decreased strength;Decreased range of motion;Decreased activity tolerance;Decreased balance;Decreased mobility;Decreased knowledge of use of DME;Decreased knowledge of precautions;Pain          PT Treatment Interventions DME instruction;Gait training;Functional mobility training;Therapeutic activities;Therapeutic exercise;Patient/family education    PT Goals (Current goals can be found in the Care Plan section)  Acute Rehab PT Goals Patient Stated Goal: Decrease pain PT Goal Formulation: With patient Time For Goal Achievement: 09/28/16 Potential to Achieve Goals: Good    Frequency 7X/week   Barriers to discharge        Co-evaluation               End of Session   Activity Tolerance: Patient limited by pain;Patient limited by lethargy Patient left: in bed;with call bell/phone within reach Nurse Communication: Patient requests pain meds         Time: [id number]  (RN with patient 11:17-11:39) PT Time Calculation (min) (ACUTE ONLY): 43 min - 24 min = 19 min total   Charges:   PT Evaluation $PT Eval Moderate Complexity: 1 Procedure     PT G Codes:        Despina Pole 10/08/16, 1:55 PM Carita Pian. Sanjuana Kava, [place] Pager [phone]

## 2016-09-21 NOTE — Progress Notes (Signed)
OT Cancellation Note  Patient Details Name: [name] [name] MRN: [id number] DOB: 01-15-1934   Cancelled Treatment:    Reason Eval/Treat Not Completed: Pain limiting ability to participate;Fatigue/lethargy limiting ability to participate  Britt Bottom 09/21/2016, 1:13 PM

## 2016-09-22 NOTE — Progress Notes (Signed)
Orthopedic Tech Progress Note Patient Details:  [name] [name] [birth year] [id number]  Patient ID: [name] [name], female   DOB: Feb 16, 1934, 83 y.o.   MRN: [id number] Applied cpm 0-40. This is all pt could handle. pt already had pain meds.  Karolee Stamps 09/22/2016, 5:53 AM

## 2016-09-22 NOTE — Progress Notes (Signed)
Orthopedic Tech Progress Note Patient Details:  [name] [name] 11/20/1933 [id number] On cpm at 1830 Patient ID: [name] [name], female   DOB: [birth year], 83 y.o.   MRN: [id number]   [name] [name] 09/22/2016, 6:30 PM

## 2016-09-22 NOTE — NC FL2 (Signed)
[place] MEDICAID FL2 LEVEL OF CARE SCREENING TOOL     IDENTIFICATION  Patient Name: [name] [name] Birthdate: 1933/09/05 Sex: female Admission Date (Current Location): 09/20/2016  [facility] and [place] Number:  Herbalist and Address:  The . [facility], [place] [address], [place], [place] [address]      Provider Number: [id number]  Attending Physician Name and Address:  Melrose Nakayama, MD  Relative Name and Phone Number:       Current Level of Care: Hospital Recommended Level of Care: [place] Prior Approval Number:    Date Approved/Denied: 09/22/16 PASRR Number: [id number]  Discharge Plan: SNF    Current Diagnoses: Patient Active Problem List   Diagnosis Date Noted  . Primary localized osteoarthritis of right knee 09/20/2016  . Primary osteoarthritis of right knee 09/20/2016  . Primary osteoarthritis of left knee 10/13/2015  . Radiculopathy 07/08/2015  . 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 Preoperative clearance 03/20/2013  . DIASTOLIC HEART FAILURE, CHRONIC 07/03/2009  . Aortic valve disorder 06/17/2009  . UNSPECIFIED HYPOTHYROIDISM 07/28/2008  . Hyperlipidemia 07/28/2008  . Essential hypertension 07/28/2008  . CAD, NATIVE VESSEL 07/28/2008    Orientation RESPIRATION BLADDER Height & Weight     Self, Time, Situation, Place  Normal Incontinent    Ht: 5\' 3"  (1.6 m)  Wt: 176 lb 14.4 oz (80.2 kg)     BEHAVIORAL SYMPTOMS/MOOD NEUROLOGICAL BOWEL NUTRITION STATUS      Continent  (Please see d/c summary)  AMBULATORY STATUS COMMUNICATION OF NEEDS Skin   Extensive Assist Verbally Surgical wounds (Closed incision right knee, compression wrap)                       Personal Care Assistance Level of Assistance  Bathing, Feeding, Dressing Bathing Assistance: Limited assistance Feeding assistance: Independent Dressing Assistance: Limited assistance     Functional Limitations Info  Sight, Hearing, Speech  Sight Info: Adequate Hearing Info: Adequate Speech Info: Adequate    SPECIAL CARE FACTORS FREQUENCY  OT (By licensed OT), PT (By licensed PT)     PT Frequency: 7x week OT Frequency: 7x week            Contractures Contractures Info: Not present    Additional Factors Info  Code Status, Allergies Code Status Info: Full Code Allergies Info: No known allergies           Current Medications (09/22/2016):  This is the current hospital active medication list Current Facility-Administered Medications  Medication Dose Route Frequency Provider Last Rate Last Dose  . acetaminophen (TYLENOL) tablet 650 mg  650 mg Oral Q6H PRN Loni Dolly, PA-C       Or  . acetaminophen (TYLENOL) suppository 650 mg  650 mg Rectal Q6H PRN Loni Dolly, PA-C      . alum & mag hydroxide-simeth (MAALOX/MYLANTA) 200-200-20 MG/5ML suspension 30 mL  30 mL Oral Q4H PRN Loni Dolly, PA-C      . aspirin EC tablet 325 mg  325 mg Oral BID PC Loni Dolly, PA-C   325 mg at 09/21/16 1736  . bisacodyl (DULCOLAX) EC tablet 5 mg  5 mg Oral Daily PRN Loni Dolly, PA-C      . diphenhydrAMINE (BENADRYL) 12.5 MG/5ML elixir 12.5-25 mg  12.5-25 mg Oral Q4H PRN Loni Dolly, PA-C      . docusate sodium (COLACE) capsule 100 mg  100 mg Oral BID Loni Dolly, PA-C   100 mg at 09/21/16 2201  .  ezetimibe-simvastatin (VYTORIN) 10-40 MG per tablet 1 tablet  1 tablet Oral QHS Loni Dolly, PA-C   1 tablet at 09/21/16 2202  . hydrochlorothiazide (MICROZIDE) capsule 12.5 mg  12.5 mg Oral Daily Loni Dolly, PA-C   12.5 mg at 09/21/16 0910  . HYDROcodone-acetaminophen (NORCO/VICODIN) 5-325 MG per tablet 1-2 tablet  1-2 tablet Oral Q4H PRN Loni Dolly, PA-C   1 tablet at 09/22/16 0526  . HYDROmorphone (DILAUDID) injection 0.5-1 mg  0.5-1 mg Intravenous Q3H PRN Theone Murdoch Hammons, RPH   1 mg at 09/21/16 1117  . isosorbide mononitrate (IMDUR) 24 hr tablet 120 mg  120 mg Oral Daily Loni Dolly, PA-C   120 mg at 09/21/16 0911  . lactated ringers  infusion   Intravenous Continuous Loni Dolly, PA-C   Stopped at 09/20/16 1815  . levothyroxine (SYNTHROID, LEVOTHROID) tablet 50 mcg  50 mcg Oral QAC breakfast Loni Dolly, PA-C   50 mcg at 09/22/16 0526  . losartan (COZAAR) tablet 100 mg  100 mg Oral Daily Loni Dolly, PA-C   100 mg at 09/21/16 0910  . menthol-cetylpyridinium (CEPACOL) lozenge 3 mg  1 lozenge Oral PRN Loni Dolly, PA-C       Or  . phenol (CHLORASEPTIC) mouth spray 1 spray  1 spray Mouth/Throat PRN Loni Dolly, PA-C      . methocarbamol (ROBAXIN) tablet 750 mg  750 mg Oral Q6H PRN Loni Dolly, PA-C   750 mg at 09/22/16 0526  . metoCLOPramide (REGLAN) tablet 5-10 mg  5-10 mg Oral Q8H PRN Loni Dolly, PA-C       Or  . metoCLOPramide (REGLAN) injection 5-10 mg  5-10 mg Intravenous Q8H PRN Loni Dolly, PA-C      . metoprolol succinate (TOPROL-XL) 24 hr tablet 50 mg  50 mg Oral Daily Loni Dolly, PA-C   50 mg at 09/21/16 0911  . ondansetron (ZOFRAN) tablet 4 mg  4 mg Oral Q6H PRN Loni Dolly, PA-C       Or  . ondansetron [facility]) injection 4 mg  4 mg Intravenous Q6H PRN Loni Dolly, PA-C      . traZODone (DESYREL) tablet 225 mg  225 mg Oral QHS Loni Dolly, PA-C   225 mg at 09/21/16 2201  . venlafaxine XR (EFFEXOR-XR) 24 hr capsule 150 mg  150 mg Oral BID Loni Dolly, PA-C   150 mg at 09/21/16 2201     Discharge Medications: Please see discharge summary for a list of discharge medications.  Relevant Imaging Results:  Relevant Lab Results:   Additional Information ssn: [id number]  Alla German, LCSW

## 2016-09-22 NOTE — Progress Notes (Signed)
Subjective: 2 Days Post-Op Procedure(s) (LRB): TOTAL KNEE ARTHROPLASTY (Right)  Activity level:  wbat Diet tolerance:  ok Voiding:  ok Patient reports pain as mild.    Objective: Vital signs in last 24 hours: Temp:  [98.1 F (36.7 C)-99.1 F (37.3 C)] 98.1 F (36.7 C) (02/08 0515) Pulse Rate:  [64-70] 65 (02/08 0515) Resp:  [16-18] 16 (02/08 0515) BP: (100-141)/(54-68) 121/54 (02/08 0515) SpO2:  [92 %-99 %] 92 % (02/08 0515)  Labs: No results for input(s): HGB in the last 72 hours. No results for input(s): WBC, RBC, HCT, PLT in the last 72 hours. No results for input(s): NA, K, CL, CO2, BUN, CREATININE, GLUCOSE, CALCIUM in the last 72 hours. No results for input(s): LABPT, INR in the last 72 hours.  Physical Exam:  Neurologically intact ABD soft Neurovascular intact Sensation intact distally Intact pulses distally Dorsiflexion/Plantar flexion intact Incision: dressing C/D/I and no drainage No cellulitis present Compartment soft  Assessment/Plan:  2 Days Post-Op Procedure(s) (LRB): TOTAL KNEE ARTHROPLASTY (Right) Advance diet Up with therapy Plan for discharge tomorrow Discharge to SNF if doing well and cleared by PT. I changed dressing to aquacel today. I would like to try and cut back on patients pain medication as she seems a little sedated and pleasantly confused this morning. Continue on ASA 325mg  BID x 2 weeks post op. Follow up in office 2 weeks post op.    Sanjana Folz, Larwance Sachs 09/22/2016, 7:01 AM

## 2016-09-22 NOTE — Progress Notes (Signed)
Physical Therapy Treatment Patient Details Name: [name] [name] MRN: [id number] DOB: [birth year] Today's Date: 09/22/2016    History of Present Illness Patient is an 83 yo female admitted 09/21/16 now s/p Rt TKA.    PMH:  OA, CHF, HTN, HLD, CAD, anxiety, depression, DM, Lt TKA 09/2015    PT Comments    Pt is POD 2 and moving better with therapy this session. Performed mobility to EOB with Min A required this session and patient has decreasec c/o pain. Pt is assisted on a off the bed pan this session as she didn't feel able to get to [facility]. Pt was assisted to recliner and OOB for first time since surgery with decreased c/o discomfort this session.    Follow Up Recommendations  SNF;Supervision/Assistance - 24 hour     Equipment Recommendations  None recommended by PT    Recommendations for Other Services       Precautions / Restrictions Precautions Precautions: Knee Precaution Booklet Issued: Yes (comment) Precaution Comments: Reviewed knee precautions Required Braces or Orthoses: Knee Immobilizer - Right Knee Immobilizer - Right: On when out of bed or walking Restrictions Weight Bearing Restrictions: Yes RLE Weight Bearing: Weight bearing as tolerated    Mobility  Bed Mobility Overal bed mobility: Needs Assistance Bed Mobility: Supine to Sit     Supine to sit: Min assist     General bed mobility comments: Min A to bring RLE EOB, HOB elevated and assistance at pad to bring pt EOB  Transfers Overall transfer level: Needs assistance Equipment used: Rolling walker (2 wheeled) Transfers: Sit to/from [facility] Sit to Stand: Min assist;From elevated surface Stand pivot transfers: Min assist       General transfer comment: MIn A from EOB with cues for hand placement. MIn A with cues for sequencing for sitting in recliner. Went out right side of bed this session.   Ambulation/Gait                 Stairs            Wheelchair Mobility     Modified Rankin (Stroke Patients Only)       Balance Overall balance assessment: Needs assistance Sitting-balance support: No upper extremity supported;Feet supported Sitting balance-Leahy Scale: Fair Sitting balance - Comments: Pt able to sit EOB without assistance   Standing balance support: Bilateral upper extremity supported;During functional activity Standing balance-Leahy Scale: Poor Standing balance comment: relies on RW for stability                    Cognition Arousal/Alertness: Awake/alert Behavior During Therapy: [facility] for tasks assessed/performed;Anxious Overall Cognitive Status: Within Functional Limits for tasks assessed                      Exercises      General Comments        Pertinent Vitals/Pain Pain Assessment: 0-10 Pain Score: 6  Pain Location: Rt knee Pain Descriptors / Indicators: Stabbing;Moaning;Operative site guarding Pain Intervention(s): Monitored during session;Limited activity within patient's tolerance;Repositioned;Ice applied    Home Living                      Prior Function            PT Goals (current goals can now be found in the care plan section) Acute Rehab PT Goals Patient Stated Goal: Decrease pain Progress towards PT goals: Progressing toward goals    Frequency  7X/week      PT Plan Current plan remains appropriate    Co-evaluation             End of Session Equipment Utilized During Treatment: Gait belt;Right knee immobilizer Activity Tolerance: Patient tolerated treatment well;Patient limited by pain Patient left: in chair;with call bell/phone within reach     Time: 0831-0910 PT Time Calculation (min) (ACUTE ONLY): 39 min  Charges:  $Therapeutic Activity: 38-52 mins                    G Codes:      Scheryl Marten PT, DPT  [phone]  09/22/2016, 9:16 AM

## 2016-09-22 NOTE — Evaluation (Signed)
Occupational Therapy Evaluation Patient Details Name: [name] [name] MRN: [id number] DOB: [birth year] Today's Date: 09/22/2016    History of Present Illness Patient is an 83 yo female admitted 09/21/16 now s/p Rt TKA.    PMH:  OA, CHF, HTN, HLD, CAD, anxiety, depression, DM, Lt TKA 09/2015   Clinical Impression   Pt admitted as above, currently demonstrating deficits in her ability to perform ADL and self care tasks as well as functional transfers (See OT problem list below). Pt should benefit from acute OT followed by ST Rehab to assist w/ maximizing overall independence prior to anticipated d/c home with spouse. Pt was with noted confusion this date, no family present for baseline level.    Follow Up Recommendations  SNF;Supervision/Assistance - 24 hour    Equipment Recommendations  Other (comment) (Has DME; defer to next venue)    Recommendations for Other Services       Precautions / Restrictions Precautions Precautions: Knee;Fall Precaution Booklet Issued: Yes (comment) Precaution Comments: Reviewed knee precautions Required Braces or Orthoses: Knee Immobilizer - Right Knee Immobilizer - Right: On when out of bed or walking Restrictions Weight Bearing Restrictions: Yes RLE Weight Bearing: Weight bearing as tolerated      Mobility Bed Mobility Overal bed mobility:  (Pt up in chair upon OT arrival) Bed Mobility: Supine to Sit     Supine to sit: Min assist     General bed mobility comments: Min A to bring RLE EOB, HOB elevated and assistance at pad to bring pt EOB  Transfers Overall transfer level: Needs assistance Equipment used: Rolling walker (2 wheeled) Transfers: Sit to/from [facility] Sit to Stand: Mod assist Stand pivot transfers: Min assist       General transfer comment: VC's nad tc's for hand placement, safety and sequencing with RW this date.    Balance Overall balance assessment: Needs assistance Sitting-balance support: No upper  extremity supported;Feet supported Sitting balance-Leahy Scale: Fair Sitting balance - Comments: Pt able to sit EOB without assistance   Standing balance support: Bilateral upper extremity supported;During functional activity Standing balance-Leahy Scale: Poor Standing balance comment: Relies on RW heavily for stability, leaned forward several times on forearms "I'm tired" vc's for safety and to stand tall w/ RW & pt able to follow commands.                            ADL Overall ADL's : Needs assistance/impaired     Grooming: Wash/dry hands;Set up;Sitting   Upper Body Bathing: Set up;Sitting   Lower Body Bathing: Moderate assistance;Sit to/from stand   Upper Body Dressing : Set up;Sitting   Lower Body Dressing: Maximal assistance;Sit to/from stand   Toilet Transfer: Moderate assistance;Ambulation;RW;BSC (Simulated transfer from recliner 2-3 steps and back to recliner - see below) Toilet Transfer Details (indicate cue type and reason): Attempted toilet transfer this am, pt w/o 3:1 in room, nurse tech is aware and RN ordered. Pt assisted to standing from chair but only able to take 2-3 steps (w/ 2 rest breaks) before stating, "I can't do this" and urinating some on floor. Recliner was brought behind pt and NT placed bedpan and pt finished urinating. Pt was confused during this. Toileting- Clothing Manipulation and Hygiene: Maximal assistance;Sit to/from stand;+2 for physical assistance       Functional mobility during ADLs: Minimal assistance;Moderate assistance;Rolling walker (Pt with fluctuating assist required as she would fatigues and lean forward on RW, decreased safety awareness)  General ADL Comments: Pt seen for OT assessment followed by ADL retraining session with focus on functional moblity and transfers, toileting. Pt was noted to be confused at times, no family present for assist with baseline (may be medication related). Moves slowly, is anxious and ocassionally  yells out during session. She should benefit from acute OT followed by [facility]. Will follow.     Vision  Wears glasses at all times.   Perception     Praxis      Pertinent Vitals/Pain Pain Assessment: Faces Pain Score: 6  Faces Pain Scale: Hurts little more Pain Location: Rt knee Pain Descriptors / Indicators: Operative site guarding;Sore;Grimacing;Guarding Pain Intervention(s): Limited activity within patient's tolerance;Monitored during session;Repositioned;Ice applied     Hand Dominance Right   Extremity/Trunk Assessment Upper Extremity Assessment Upper Extremity Assessment: Overall WFL for tasks assessed   Lower Extremity Assessment Lower Extremity Assessment: Defer to PT evaluation       Communication Communication Communication: No difficulties   Cognition Arousal/Alertness: Awake/alert Behavior During Therapy: WFL for tasks assessed/performed;Anxious Overall Cognitive Status: No family/caregiver present to determine baseline cognitive functioning;decreased safety awareness, confused at times (?may be medication related).                 General Comments: Anxious, confused at times, ocassional yelling   General Comments       Exercises       Shoulder Instructions      Home Living Family/patient expects to be discharged to:: Private residence Living Arrangements: Spouse/significant other Available Help at Discharge: Family;Available 24 hours/day Type of Home: House Home Access: Stairs to enter [employer] of Steps: 1 threshold Entrance Stairs-Rails: None Home Layout: One level     Bathroom Shower/Tub: Tub/shower unit Shower/tub characteristics: Door Biochemist, clinical: Standard Bathroom Accessibility: Yes How Accessible: Accessible via walker Home Equipment: Walker - 2 wheels;Bedside commode;Grab bars - tub/shower;Shower seat;Other (comment);Wheelchair - manual (Lift chair)          Prior Functioning/Environment Level of  Independence: Independent with assistive device(s)        Comments: Patient reports she uses her RW at times ("when my Lt knee is acting up")        OT Problem List: Decreased strength;Decreased cognition;Decreased knowledge of precautions;Pain;Decreased safety awareness;Decreased activity tolerance;Decreased knowledge of use of DME or AE   OT Treatment/Interventions: Self-care/ADL training;DME and/or AE instruction;Therapeutic activities;Therapeutic exercise;Energy conservation;Patient/family education    OT Goals(Current goals can be found in the care plan section) Acute Rehab OT Goals Patient Stated Goal: Decreased pain; Rehab before going home Time For Goal Achievement: 10/06/16 Potential to Achieve Goals: Good  OT Frequency: Min 2X/week   Barriers to D/C:            Co-evaluation              End of Session Equipment Utilized During Treatment: Gait belt;Rolling walker;Right knee immobilizer Nurse Communication: Other (comment) (Pt needs 3:1 in room)  Activity Tolerance: Patient tolerated treatment well Patient left: in chair;with call bell/phone within reach;with nursing/sitter in room   Time: 1111-1136 OT Time Calculation (min): 25 min Charges:  OT General Charges $OT Visit: 1 Procedure OT Evaluation $OT Eval Low Complexity: 1 Procedure OT Treatments $Self Care/Home Management : 8-22 mins G-Codes:    Josephine Igo Dixon, OTR/L 09/22/2016, 12:02 PM

## 2016-09-22 NOTE — Clinical Social Work Note (Signed)
Clinical Social Work Assessment  Patient Details  Name: [name] [name] MRN: [id number] Date of Birth: 1934-03-30  Date of referral:  09/22/16               Reason for consult:  Facility Placement                Permission sought to share information with:    Permission granted to share information::     Name::        Agency::     Relationship::     Contact Information:     Housing/Transportation Living arrangements for the past 2 months:  Single Family Home Source of Information:  Patient Patient Interpreter Needed:  None Criminal Activity/Legal Involvement Pertinent to Current Situation/Hospitalization:  No - Comment as needed Significant Relationships:  Spouse Lives with:  Spouse Do you feel safe going back to the place where you live?    Need for family participation in patient care:     Care giving concerns:  No family or friends present at bedside during initial assessment. Pt's spouse resides with pt and serves as a support.   Social Worker assessment / plan:  CSW spoke with pt at bedside to complete initial assessment. Pt lives at home with spouse. Pt reports her husband is hard of hearing and has bad eye sight. Pt is agreeable to SNF placement at this time. Pt is agreeable to [facility]. CSW will follow up with facility determining b/o and present to pt once available.   Employment status:  Retired Nurse, adult PT Recommendations:  [place] / Referral to community resources:  [place]  Patient/Family's Response to care:  Pt verbalized understanding of CSW role and expressed appreciation for support. Pt denies any concern regarding pt care at this time.   Patient/Family's Understanding of and Emotional Response to Diagnosis, Current Treatment, and Prognosis:  Pt understanding and realistic regarding physical limitations. Pt understands the need for SNF placement at d/c. Pt agreeable to SNF placement at  d/c, at this time. Pt's responses emotionally appropriate during conversation with CSW. Pt denies any concern regarding treatment plan at this time. CSW will continue to provide support and facilitate d/c needs.   Emotional Assessment Appearance:  Appears stated age Attitude/Demeanor/Rapport:   (Patient was appropriate.) Affect (typically observed):  Accepting, Appropriate, Calm Orientation:  Oriented to Self, Oriented to Place, Oriented to  Time, Oriented to Situation Alcohol / Substance use:  Not Applicable Psych involvement (Current and /or in the community):  No (Comment)  Discharge Needs  Concerns to be addressed:  No discharge needs identified Readmission within the last 30 days:  No Current discharge risk:  Dependent with Mobility Barriers to Discharge:  Continued Medical Work up   [employer], LCSW 09/22/2016, 1:53 PM

## 2016-09-23 MED ORDER — TRAMADOL HCL 50 MG PO TABS: 50.0000 mg | ORAL_TABLET | Freq: Four times a day (QID) | ORAL | 0 refills | Status: DC | PRN

## 2016-09-23 MED ORDER — ASPIRIN 325 MG PO TBEC: 325.0000 mg | DELAYED_RELEASE_TABLET | Freq: Two times a day (BID) | ORAL | 0 refills | Status: DC

## 2016-09-23 MED ORDER — METHOCARBAMOL 500 MG PO TABS: 500.0000 mg | ORAL_TABLET | Freq: Four times a day (QID) | ORAL | 0 refills | Status: DC | PRN

## 2016-09-23 NOTE — Plan of Care (Signed)
Problem: Pain Managment: Goal: General experience of comfort will improve Outcome: Progressing Medicated once for pain with full relief  Problem: Tissue Perfusion: Goal: Risk factors for ineffective tissue perfusion will decrease Outcome: Progressing Denies S/S of DVT  Problem: Activity: Goal: Risk for activity intolerance will decrease Outcome: Progressing Able to turn from side to side with moderate assistance  Problem: Bowel/Gastric: Goal: Will not experience complications related to bowel motility Outcome: Progressing No gastric or bowel issues reported

## 2016-09-23 NOTE — Clinical Social Work Note (Addendum)
Clinical Social Worker facilitated patient discharge including contacting patient family and facility to confirm patient discharge plans.  Clinical information faxed to facility and family agreeable with plan.  CSW arranged ambulance transport via [facility] (4:00) to [place] .  RN to call [phone] for report prior to discharge. Patient is going to room 612.  Clinical Social Worker will sign off for now as social work intervention is no longer needed. Please consult [place] again if new need arises.  [address] [place]

## 2016-09-23 NOTE — Progress Notes (Signed)
Physical Therapy Treatment Patient Details Name: [name] [name] MRN: [id number] DOB: 1933-09-22 Today's Date: 09/23/2016    History of Present Illness Patient is an 83 yo female admitted 09/21/16 now s/p Rt TKA.    PMH:  OA, CHF, HTN, HLD, CAD, anxiety, depression, DM, Lt TKA 09/2015    PT Comments    Patient is progressing toward mobility goals and able to ambulate 85ft with min A. Pt continues to be confused and with urinary incontinence X2 during session. Current plan remains appropriate.   Follow Up Recommendations  SNF;Supervision/Assistance - 24 hour     Equipment Recommendations  None recommended by PT    Recommendations for Other Services       Precautions / Restrictions Precautions Precautions: Knee Precaution Booklet Issued: Yes (comment) Precaution Comments: Reviewed knee precautions Required Braces or Orthoses: Knee Immobilizer - Right Knee Immobilizer - Right: On when out of bed or walking Restrictions Weight Bearing Restrictions: Yes RLE Weight Bearing: Weight bearing as tolerated    Mobility  Bed Mobility Overal bed mobility: Needs Assistance Bed Mobility: Supine to Sit     Supine to sit: Min guard     General bed mobility comments: min guard for safety; use of rails and HOB elevated  Transfers Overall transfer level: Needs assistance Equipment used: Rolling walker (2 wheeled) Transfers: Sit to/from Stand Sit to Stand: Min assist;Mod assist         General transfer comment: min A from EOB and mod A from commode with grab bar; cues for safe hand placement adn technique; assist to power up into standing  Ambulation/Gait Ambulation/Gait assistance: Min assist Ambulation Distance (Feet): 30 Feet Assistive device: Rolling walker (2 wheeled) Gait Pattern/deviations: Step-to pattern;Decreased stance time - right;Step-through pattern;Decreased step length - right;Decreased step length - left;Decreased weight shift to right Gait velocity: decreased    General Gait Details: pt with step to pattern initially but able to step through with max cues for sequencing and step length; cues for posture as well   Stairs            Wheelchair Mobility    Modified Rankin (Stroke Patients Only)       Balance Overall balance assessment: Needs assistance Sitting-balance support: No upper extremity supported;Feet supported Sitting balance-Leahy Scale: Fair Sitting balance - Comments: Pt able to sit EOB without assistance   Standing balance support: Bilateral upper extremity supported;During functional activity Standing balance-Leahy Scale: Poor Standing balance comment: relies on RW for stability                    Cognition Arousal/Alertness: Awake/alert Behavior During Therapy: WFL for tasks assessed/performed Overall Cognitive Status: Impaired/Different from baseline (husband reported pt has been forgetful lately-per RN) Area of Impairment: Memory;Safety/judgement;Problem solving     Memory: Decreased short-term memory   Safety/Judgement: Decreased awareness of safety   Problem Solving: Difficulty sequencing;Requires verbal cues General Comments: pt still confused at times and reminded several times that she is going to rehab and not home and pt would respond "oh yea, yea that's right"; pt also believed that I had helped her with exercises prior to session beginning    Exercises Total Joint Exercises Goniometric ROM: ~85 degrees flexion in sitting    General Comments General comments (skin integrity, edema, etc.): pt incontinent of urine X2 during session      Pertinent Vitals/Pain Pain Assessment: Faces Faces Pain Scale: Hurts little more Pain Location: Rt knee Pain Descriptors / Indicators: Grimacing;Guarding;Sore Pain Intervention(s): Limited activity  within patient's tolerance;Monitored during session;Premedicated before session;Repositioned    Home Living                      Prior Function             PT Goals (current goals can now be found in the care plan section) Acute Rehab PT Goals Patient Stated Goal: none stated Progress towards PT goals: Progressing toward goals    Frequency    7X/week      PT Plan Current plan remains appropriate    Co-evaluation             End of Session Equipment Utilized During Treatment: Gait belt Activity Tolerance: Patient tolerated treatment well;Other (comment) (limited by urinary incontinence) Patient left: in chair;with call bell/phone within reach     Time: [id number] PT Time Calculation (min) (ACUTE ONLY): 39 min  Charges:  $Gait Training: 8-22 mins $Therapeutic Activity: 23-37 mins                    G Codes:      Salina April, PTA Pager: [phone]   09/23/2016, 1:26 PM

## 2016-09-23 NOTE — Progress Notes (Signed)
Subjective: 3 Days Post-Op Procedure(s) (LRB): TOTAL KNEE ARTHROPLASTY (Right)   Patient seems less confused today and states that her pain is improving.  Activity level:  wbat Diet tolerance:  ok Voiding:  ok Patient reports pain as mild.    Objective: Vital signs in last 24 hours: Temp:  [98.2 F (36.8 C)-98.6 F (37 C)] 98.2 F (36.8 C) (02/09 0443) Pulse Rate:  [63-71] 63 (02/09 0443) Resp:  [16-18] 16 (02/09 0443) BP: (112-139)/(63-73) 112/63 (02/09 0443) SpO2:  [93 %-97 %] 96 % (02/09 0443)  Labs: No results for input(s): HGB in the last 72 hours. No results for input(s): WBC, RBC, HCT, PLT in the last 72 hours. No results for input(s): NA, K, CL, CO2, BUN, CREATININE, GLUCOSE, CALCIUM in the last 72 hours. No results for input(s): LABPT, INR in the last 72 hours.  Physical Exam:  Neurologically intact ABD soft Neurovascular intact Sensation intact distally Intact pulses distally Dorsiflexion/Plantar flexion intact Incision: dressing C/D/I and no drainage No cellulitis present Compartment soft  Assessment/Plan:  3 Days Post-Op Procedure(s) (LRB): TOTAL KNEE ARTHROPLASTY (Right) Advance diet Up with therapy Discharge to SNF [facility] today. Continue on ASA 325mg  BID x 2 weeks post op. Follow up in office 2 weeks post op. We will switch her from norco to tramadol for pain to decrease confusion.    Oluwafemi Villella, Larwance Sachs 09/23/2016, 8:01 AM

## 2016-09-23 NOTE — Discharge Summary (Signed)
Patient ID: [name] [name] MRN: [id number] DOB/AGE: 04/17/1934 83 y.o.  Admit date: 09/20/2016 Discharge date: 09/23/2016  Admission Diagnoses:  Principal Problem:   Primary localized osteoarthritis of right knee Active Problems:   Primary osteoarthritis of right knee   Discharge Diagnoses:  Same  Past Medical History:  Diagnosis Date  . Anginal pain ([place])   . Anxiety   . Arthritis    "little in my thumbs"back & knees   . Breast cancer ([place])    R breast, treated with mastectomy/tomoxifen in early 2000s  . Bronchitis    hx of  . CAD (coronary artery disease)    The patient had a left heart catheterization in August 2006 showed an EF of 70%, a 70% mid LAD lesion and 80% distal LAD ledsion, as well as 50% ostial first diagonal lesion.  The patient did have a drug-eluting stent placed in her mid LAD at that time.    . Depression   . Diastolic heart failure    Echo 11/10 with EF 55-60%, moderate diastolic dysfunction, no regional WMAs, mild to moderate TR, moderate LAE, mild AI  . Frequent urination at night   . GERD (gastroesophageal reflux disease)    "gone since gallbladder took out"  . Heart murmur    "small"  . Hypercholesterolemia   . Hypertension   . Hypothyroidism   . Mental disorder   . Sleep disturbance    sleep study done- told that there wasn't any apnea concerns, states she is up & down for use of bathroom several times per night   . Type II diabetes mellitus ([place])    "controlled w/diet and exercise"  . Urinary incontinence     Surgeries: Procedure(s): TOTAL KNEE ARTHROPLASTY on 09/20/2016   Consultants:   Discharged Condition: Improved  Hospital Course: ELLYCE VESPER is an 83 y.o. female who was admitted 09/20/2016 for operative treatment ofPrimary localized osteoarthritis of right knee. Patient has severe unremitting pain that affects sleep, daily activities, and work/hobbies. After pre-op clearance the patient was taken to the operating room on 09/20/2016 and  underwent  Procedure(s): TOTAL KNEE ARTHROPLASTY.    Patient was given perioperative antibiotics: Anti-infectives    Start     Dose/Rate Route Frequency Ordered Stop   09/20/16 1545  ceFAZolin (ANCEF) IVPB 2g/100 mL premix     2 g 200 mL/hr over 30 Minutes Intravenous Every 6 hours 09/20/16 1535 09/21/16 0344   09/20/16 0828  ceFAZolin (ANCEF) IVPB 2g/100 mL premix     2 g 200 mL/hr over 30 Minutes Intravenous On call to O.R. 09/20/16 0828 09/20/16 1057   09/20/16 0827  ceFAZolin (ANCEF) 2-4 GM/100ML-% IVPB    Comments:  Merryl Hacker   : cabinet override      09/20/16 0827 09/20/16 1057       Patient was given sequential compression devices, early ambulation, and chemoprophylaxis to prevent DVT.  Patient benefited maximally from hospital stay and there were no complications.    Recent vital signs: Patient Vitals for the past 24 hrs:  BP Temp Temp src Pulse Resp SpO2  09/23/16 0443 112/63 98.2 F (36.8 C) Oral 63 16 96 %  09/22/16 2126 137/66 98.2 F (36.8 C) Oral 71 18 93 %  09/22/16 1428 139/73 98.6 F (37 C) Oral 67 16 97 %     Recent laboratory studies: No results for input(s): WBC, HGB, HCT, PLT, NA, K, CL, CO2, BUN, CREATININE, GLUCOSE, INR, CALCIUM in the last 72 hours.  Invalid  input(s): PT, 2   Discharge Medications:   Allergies as of 09/23/2016      Reactions   No Known Allergies       Medication List    TAKE these medications   aspirin 325 MG EC tablet Take 1 tablet (325 mg total) by mouth 2 (two) times daily after a meal. What changed:  medication strength  how much to take  when to take this   calcium carbonate 600 MG Tabs tablet Commonly known as:  OS-CAL Take 600 mg by mouth daily with breakfast.   ezetimibe-simvastatin 10-40 MG tablet Commonly known as:  VYTORIN Take 1 tablet by mouth at bedtime.   hydrochlorothiazide 12.5 MG capsule Commonly known as:  MICROZIDE Take 1 capsule (12.5 mg total) by mouth daily.   isosorbide mononitrate  120 MG 24 hr tablet Commonly known as:  IMDUR Take 120 mg by mouth Daily.   levothyroxine 50 MCG tablet Commonly known as:  SYNTHROID, LEVOTHROID Take 50 mcg by mouth daily before breakfast.   losartan 100 MG tablet Commonly known as:  COZAAR Take 1 tablet (100 mg total) by mouth daily.   methocarbamol 500 MG tablet Commonly known as:  ROBAXIN Take 1 tablet (500 mg total) by mouth every 6 (six) hours as needed for muscle spasms.   metoprolol succinate 50 MG 24 hr tablet Commonly known as:  TOPROL-XL Take 50 mg by mouth daily.   traMADol 50 MG tablet Commonly known as:  ULTRAM Take 1 tablet (50 mg total) by mouth every 6 (six) hours as needed.   traZODone 150 MG tablet Commonly known as:  DESYREL Take 225 mg by mouth at bedtime.   venlafaxine XR 150 MG 24 hr capsule Commonly known as:  EFFEXOR-XR Take 150 mg by mouth 2 (two) times daily.            Durable Medical Equipment        Start     Ordered   09/20/16 1806  DME Walker rolling  Once    Question:  Patient needs a walker to treat with the following condition  Answer:  Primary osteoarthritis of right knee   09/20/16 1805   09/20/16 1806  DME 3 n 1  Once     09/20/16 1805   09/20/16 1806  DME Bedside commode  Once    Question:  Patient needs a bedside commode to treat with the following condition  Answer:  Primary osteoarthritis of right knee   09/20/16 1805      Diagnostic Studies: Dg Chest 2 View  Result Date: 09/13/2016 CLINICAL DATA:  Preop for knee replacement. EXAM: CHEST  2 VIEW COMPARISON:  Radiographs of June 25, 2015. FINDINGS: The heart size and mediastinal contours are within normal limits. Both lungs are clear. The visualized skeletal structures are unremarkable. IMPRESSION: No active cardiopulmonary disease. Electronically Signed   By: Marijo Conception, M.D.   On: 09/13/2016 15:40    Disposition: 06-Home-Health Care Svc  Discharge Instructions    Call MD / Call 911    Complete by:  As  directed    If you experience chest pain or shortness of breath, CALL 911 and be transported to the hospital emergency room.  If you develope a fever above 101 F, pus (white drainage) or increased drainage or redness at the wound, or calf pain, call your surgeon's office.   Constipation Prevention    Complete by:  As directed    Drink plenty of fluids.  Prune  juice may be helpful.  You may use a stool softener, such as Colace (over the counter) 100 mg twice a day.  Use MiraLax (over the counter) for constipation as needed.   Diet - low sodium heart healthy    Complete by:  As directed    Discharge instructions    Complete by:  As directed    INSTRUCTIONS AFTER JOINT REPLACEMENT   Remove items at home which could result in a fall. This includes throw rugs or furniture in walking pathways ICE to the affected joint every three hours while awake for 30 minutes at a time, for at least the first 3-5 days, and then as needed for pain and swelling.  Continue to use ice for pain and swelling. You may notice swelling that will progress down to the foot and ankle.  This is normal after surgery.  Elevate your leg when you are not up walking on it.   Continue to use the breathing machine you got in the hospital (incentive spirometer) which will help keep your temperature down.  It is common for your temperature to cycle up and down following surgery, especially at night when you are not up moving around and exerting yourself.  The breathing machine keeps your lungs expanded and your temperature down.   DIET:  As you were doing prior to hospitalization, we recommend a well-balanced diet.  DRESSING / WOUND CARE / SHOWERING  You may shower 3 days after surgery, but keep the wounds dry during showering.  You may use an occlusive plastic wrap (Press'n Seal for example), NO SOAKING/SUBMERGING IN THE BATHTUB.  If the bandage gets wet, change with a clean dry gauze.  If the incision gets wet, pat the wound dry with  a clean towel.  ACTIVITY  Increase activity slowly as tolerated, but follow the weight bearing instructions below.   No driving for 6 weeks or until further direction given by your physician.  You cannot drive while taking narcotics.  No lifting or carrying greater than 10 lbs. until further directed by your surgeon. Avoid periods of inactivity such as sitting longer than an hour when not asleep. This helps prevent blood clots.  You may return to work once you are authorized by your doctor.     WEIGHT BEARING   Weight bearing as tolerated with assist device (walker, cane, etc) as directed, use it as long as suggested by your surgeon or therapist, typically at least 4-6 weeks.   EXERCISES  Results after joint replacement surgery are often greatly improved when you follow the exercise, range of motion and muscle strengthening exercises prescribed by your doctor. Safety measures are also important to protect the joint from further injury. Any time any of these exercises cause you to have increased pain or swelling, decrease what you are doing until you are comfortable again and then slowly increase them. If you have problems or questions, call your caregiver or physical therapist for advice.   Rehabilitation is important following a joint replacement. After just a few days of immobilization, the muscles of the leg can become weakened and shrink (atrophy).  These exercises are designed to build up the tone and strength of the thigh and leg muscles and to improve motion. Often times heat used for twenty to thirty minutes before working out will loosen up your tissues and help with improving the range of motion but do not use heat for the first two weeks following surgery (sometimes heat can increase post-operative swelling).  These exercises can be done on a training (exercise) mat, on the floor, on a table or on a bed. Use whatever works the best and is most comfortable for you.    Use music or  television while you are exercising so that the exercises are a pleasant break in your day. This will make your life better with the exercises acting as a break in your routine that you can look forward to.   Perform all exercises about fifteen times, three times per day or as directed.  You should exercise both the operative leg and the other leg as well.   Exercises include:   Quad Sets - Tighten up the muscle on the front of the thigh (Quad) and hold for 5-10 seconds.   Straight Leg Raises - With your knee straight (if you were given a brace, keep it on), lift the leg to 60 degrees, hold for 3 seconds, and slowly lower the leg.  Perform this exercise against resistance later as your leg gets stronger.  Leg Slides: Lying on your back, slowly slide your foot toward your buttocks, bending your knee up off the floor (only go as far as is comfortable). Then slowly slide your foot back down until your leg is flat on the floor again.  Angel Wings: Lying on your back spread your legs to the side as far apart as you can without causing discomfort.  Hamstring Strength:  Lying on your back, push your heel against the floor with your leg straight by tightening up the muscles of your buttocks.  Repeat, but this time bend your knee to a comfortable angle, and push your heel against the floor.  You may put a pillow under the heel to make it more comfortable if necessary.   A rehabilitation program following joint replacement surgery can speed recovery and prevent re-injury in the future due to weakened muscles. Contact your doctor or a physical therapist for more information on knee rehabilitation.    CONSTIPATION  Constipation is defined medically as fewer than three stools per week and severe constipation as less than one stool per week.  Even if you have a regular bowel pattern at home, your normal regimen is likely to be disrupted due to multiple reasons following surgery.  Combination of anesthesia,  postoperative narcotics, change in appetite and fluid intake all can affect your bowels.   YOU MUST use at least one of the following options; they are listed in order of increasing strength to get the job done.  They are all available over the counter, and you may need to use some, POSSIBLY even all of these options:    Drink plenty of fluids (prune juice may be helpful) and high fiber foods Colace 100 mg by mouth twice a day  Senokot for constipation as directed and as needed Dulcolax (bisacodyl), take with full glass of water  Miralax (polyethylene glycol) once or twice a day as needed.  If you have tried all these things and are unable to have a bowel movement in the first 3-4 days after surgery call either your surgeon or your primary doctor.    If you experience loose stools or diarrhea, hold the medications until you stool forms back up.  If your symptoms do not get better within 1 week or if they get worse, check with your doctor.  If you experience "the worst abdominal pain ever" or develop nausea or vomiting, please contact the office immediately for further recommendations for treatment.  ITCHING:  If you experience itching with your medications, try taking only a single pain pill, or even half a pain pill at a time.  You can also use Benadryl over the counter for itching or also to help with sleep.   TED HOSE STOCKINGS:  Use stockings on both legs until for at least 2 weeks or as directed by physician office. They may be removed at night for sleeping.  MEDICATIONS:  See your medication summary on the "After Visit Summary" that nursing will review with you.  You may have some home medications which will be placed on hold until you complete the course of blood thinner medication.  It is important for you to complete the blood thinner medication as prescribed.  PRECAUTIONS:  If you experience chest pain or shortness of breath - call 911 immediately for transfer to the hospital emergency  department.   If you develop a fever greater that 101 F, purulent drainage from wound, increased redness or drainage from wound, foul odor from the wound/dressing, or calf pain - CONTACT YOUR SURGEON.                                                   FOLLOW-UP APPOINTMENTS:  If you do not already have a post-op appointment, please call the office for an appointment to be seen by your surgeon.  Guidelines for how soon to be seen are listed in your "After Visit Summary", but are typically between 1-4 weeks after surgery.  OTHER INSTRUCTIONS:   Knee Replacement:  Do not place pillow under knee, focus on keeping the knee straight while resting. CPM instructions: 0-90 degrees, 2 hours in the morning, 2 hours in the afternoon, and 2 hours in the evening. Place foam block, curve side up under heel at all times except when in CPM or when walking.  DO NOT modify, tear, cut, or change the foam block in any way.  MAKE SURE YOU:  Understand these instructions.  Get help right away if you are not doing well or get worse.    Thank you for letting us be a part of your medical care team.  It is a privilege we respect greatly.  We hope these instructions will help you stay on track for a fast and full recovery!   Increase activity slowly as tolerated    Complete by:  As directed       Follow-up Information    DALLDORF,PETER G, MD. Schedule an appointment as soon as possible for a visit in 2 weeks.   Specialty:  Orthopedic Surgery Contact information: [place] [address] [phone]            Signed: Rich Fuchs 09/23/2016, 8:05 AM

## 2016-09-23 NOTE — Care Management Important Message (Signed)
Important Message  Patient Details  Name: [name] [name] MRN: [id number] Date of Birth: Feb 06, 1934   Medicare Important Message Given:  Yes    Orbie Pyo 09/23/2016, 2:48 PM

## 2016-11-16 ENCOUNTER — Ambulatory Visit (INDEPENDENT_AMBULATORY_CARE_PROVIDER_SITE_OTHER): Payer: Medicare Other | Admitting: Ophthalmology

## 2016-12-08 ENCOUNTER — Ambulatory Visit (INDEPENDENT_AMBULATORY_CARE_PROVIDER_SITE_OTHER): Payer: Medicare Other | Admitting: Ophthalmology

## 2016-12-08 DIAGNOSIS — H353111 Nonexudative age-related macular degeneration, right eye, early dry stage: Secondary | ICD-10-CM | POA: Diagnosis not present

## 2016-12-08 DIAGNOSIS — H353122 Nonexudative age-related macular degeneration, left eye, intermediate dry stage: Secondary | ICD-10-CM | POA: Diagnosis not present

## 2016-12-08 DIAGNOSIS — E113293 Type 2 diabetes mellitus with mild nonproliferative diabetic retinopathy without macular edema, bilateral: Secondary | ICD-10-CM | POA: Diagnosis not present

## 2016-12-08 DIAGNOSIS — H43812 Vitreous degeneration, left eye: Secondary | ICD-10-CM

## 2016-12-08 DIAGNOSIS — I10 Essential (primary) hypertension: Secondary | ICD-10-CM

## 2016-12-08 DIAGNOSIS — E11319 Type 2 diabetes mellitus with unspecified diabetic retinopathy without macular edema: Secondary | ICD-10-CM | POA: Diagnosis not present

## 2016-12-08 DIAGNOSIS — H35033 Hypertensive retinopathy, bilateral: Secondary | ICD-10-CM

## 2017-01-13 NOTE — Addendum Note (Signed)
Addendum  created 01/13/17 1008 by Effie Berkshire, MD   Sign clinical note

## 2017-01-28 IMAGING — CR DG CHEST 2V
2 series · 2 of 2 positions shown · non-contrast
Comparison: Chest x-ray of 06/04/2013

CLINICAL DATA: For posterior lumbar spine fusion

EXAM:
CHEST  2 VIEW

[w chest pa]
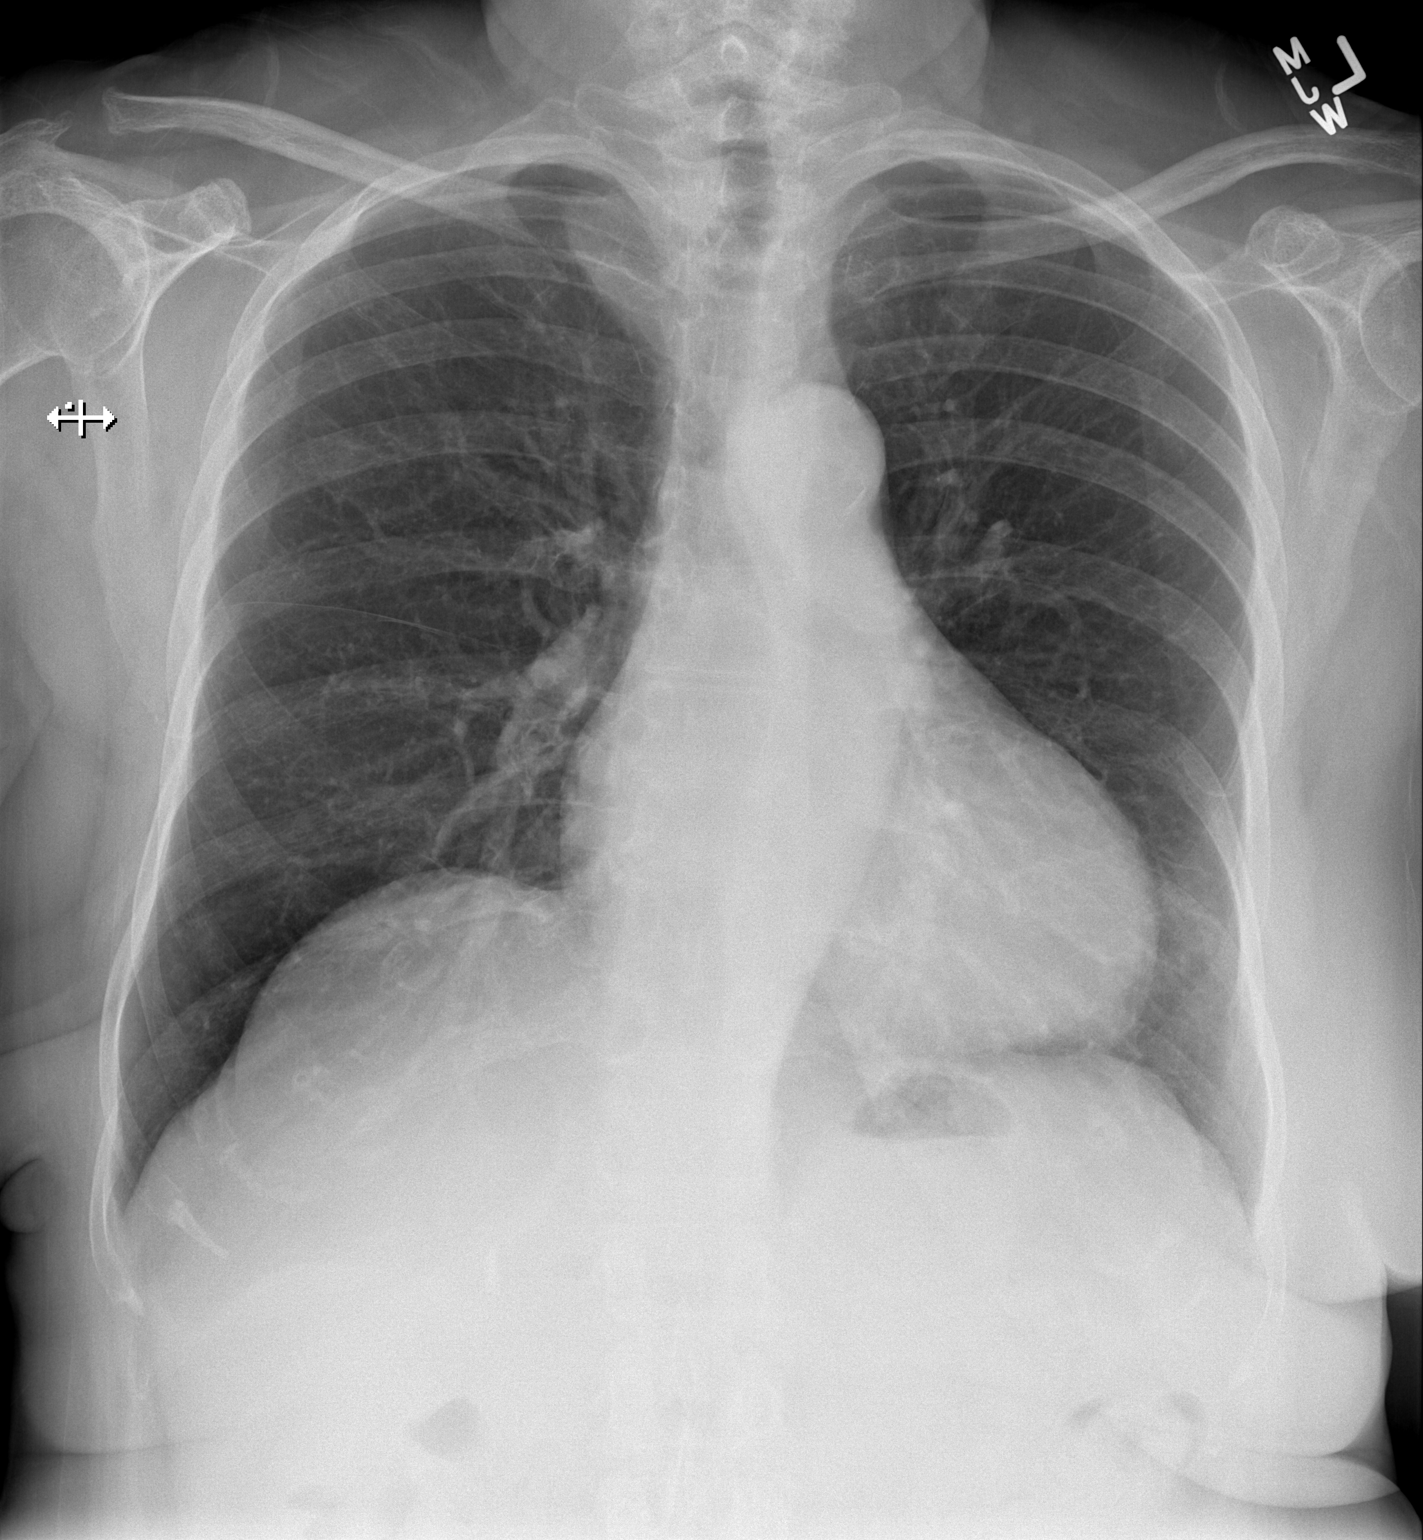

[w chest lat]
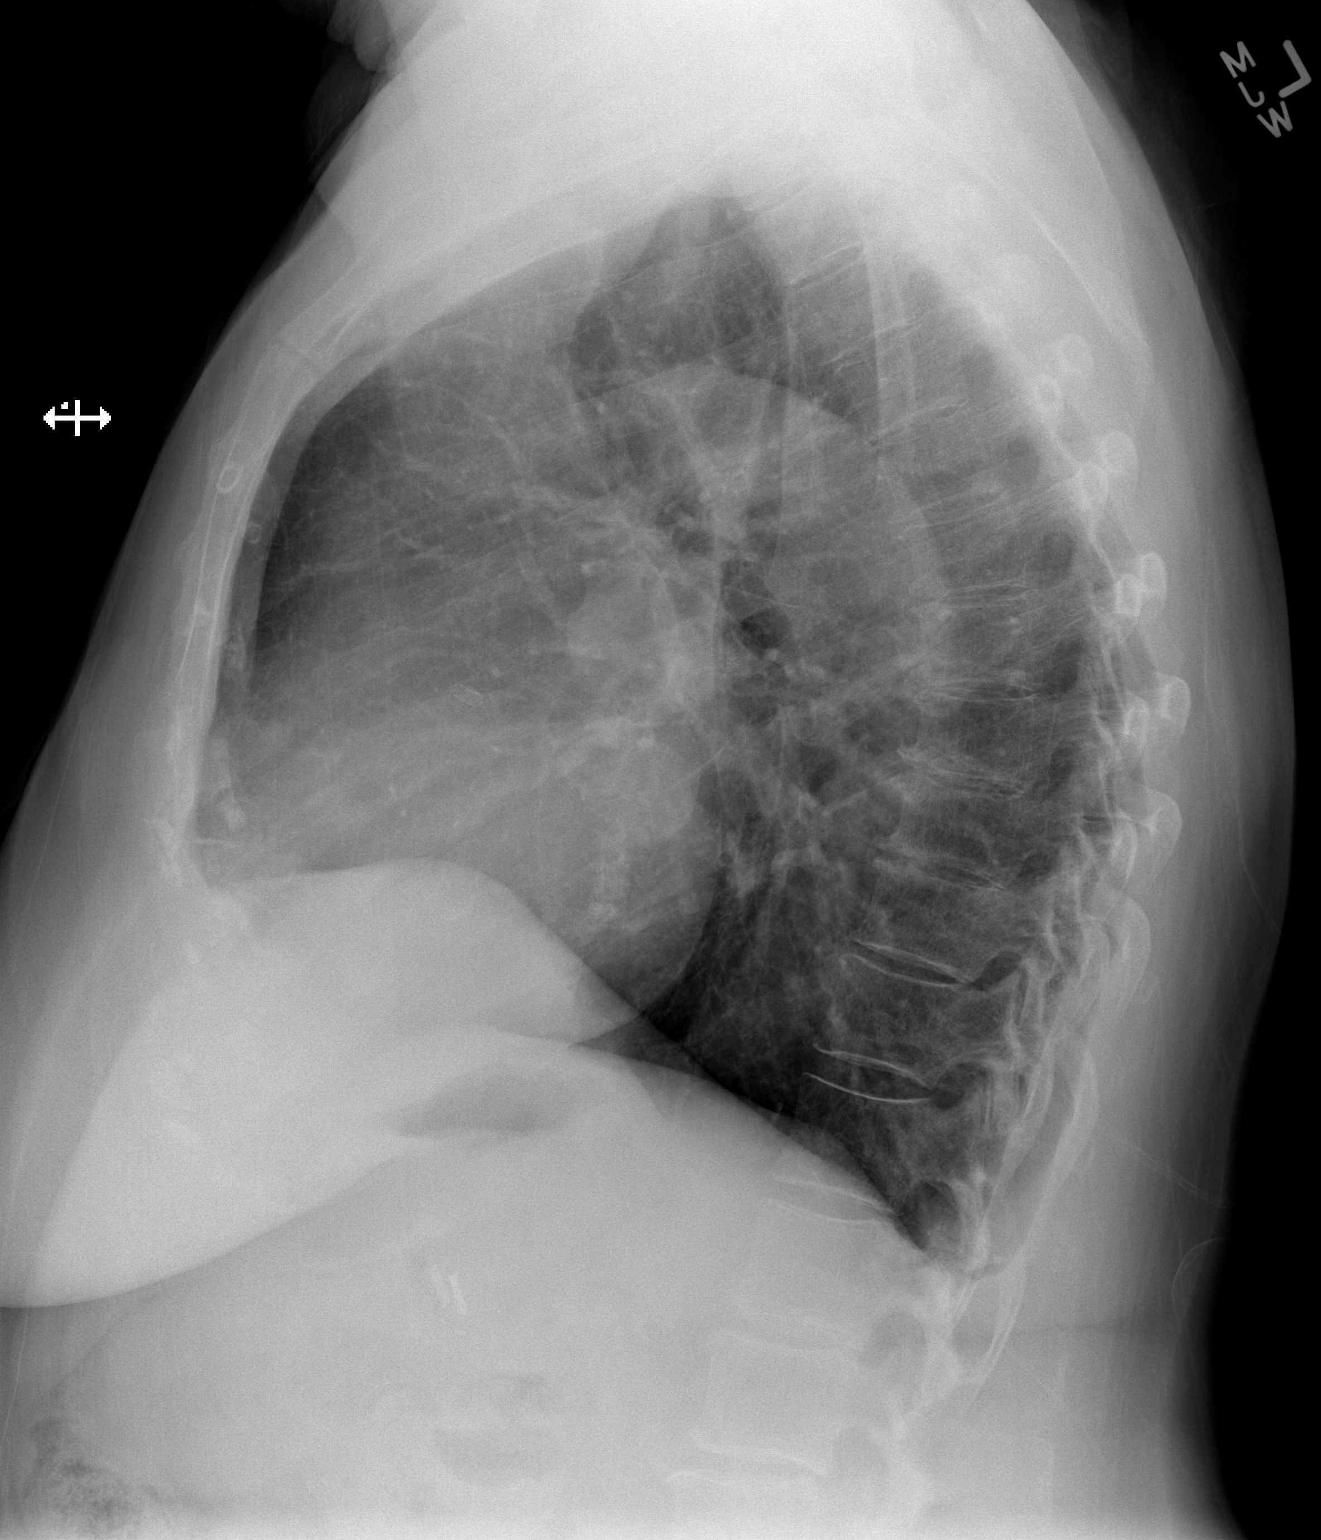

[2 of 2 positions shown; findings below may reference images not displayed]

FINDINGS: The lungs remain clear but somewhat hyperaerated. Mediastinal and
hilar contours are is unchanged. The heart is mildly enlarged and
stable. No bony abnormality is seen.
IMPRESSION: No change in hyper aeration and mild cardiomegaly. No active lung
disease.

## 2017-12-12 ENCOUNTER — Ambulatory Visit (INDEPENDENT_AMBULATORY_CARE_PROVIDER_SITE_OTHER): Payer: Medicare Other | Admitting: Ophthalmology

## 2018-04-19 IMAGING — CR DG CHEST 2V
2 series · 2 of 2 positions shown · non-contrast
Comparison: Radiographs June 25, 2015.

CLINICAL DATA: Preop for knee replacement.

EXAM:
CHEST  2 VIEW

[w chest pa]
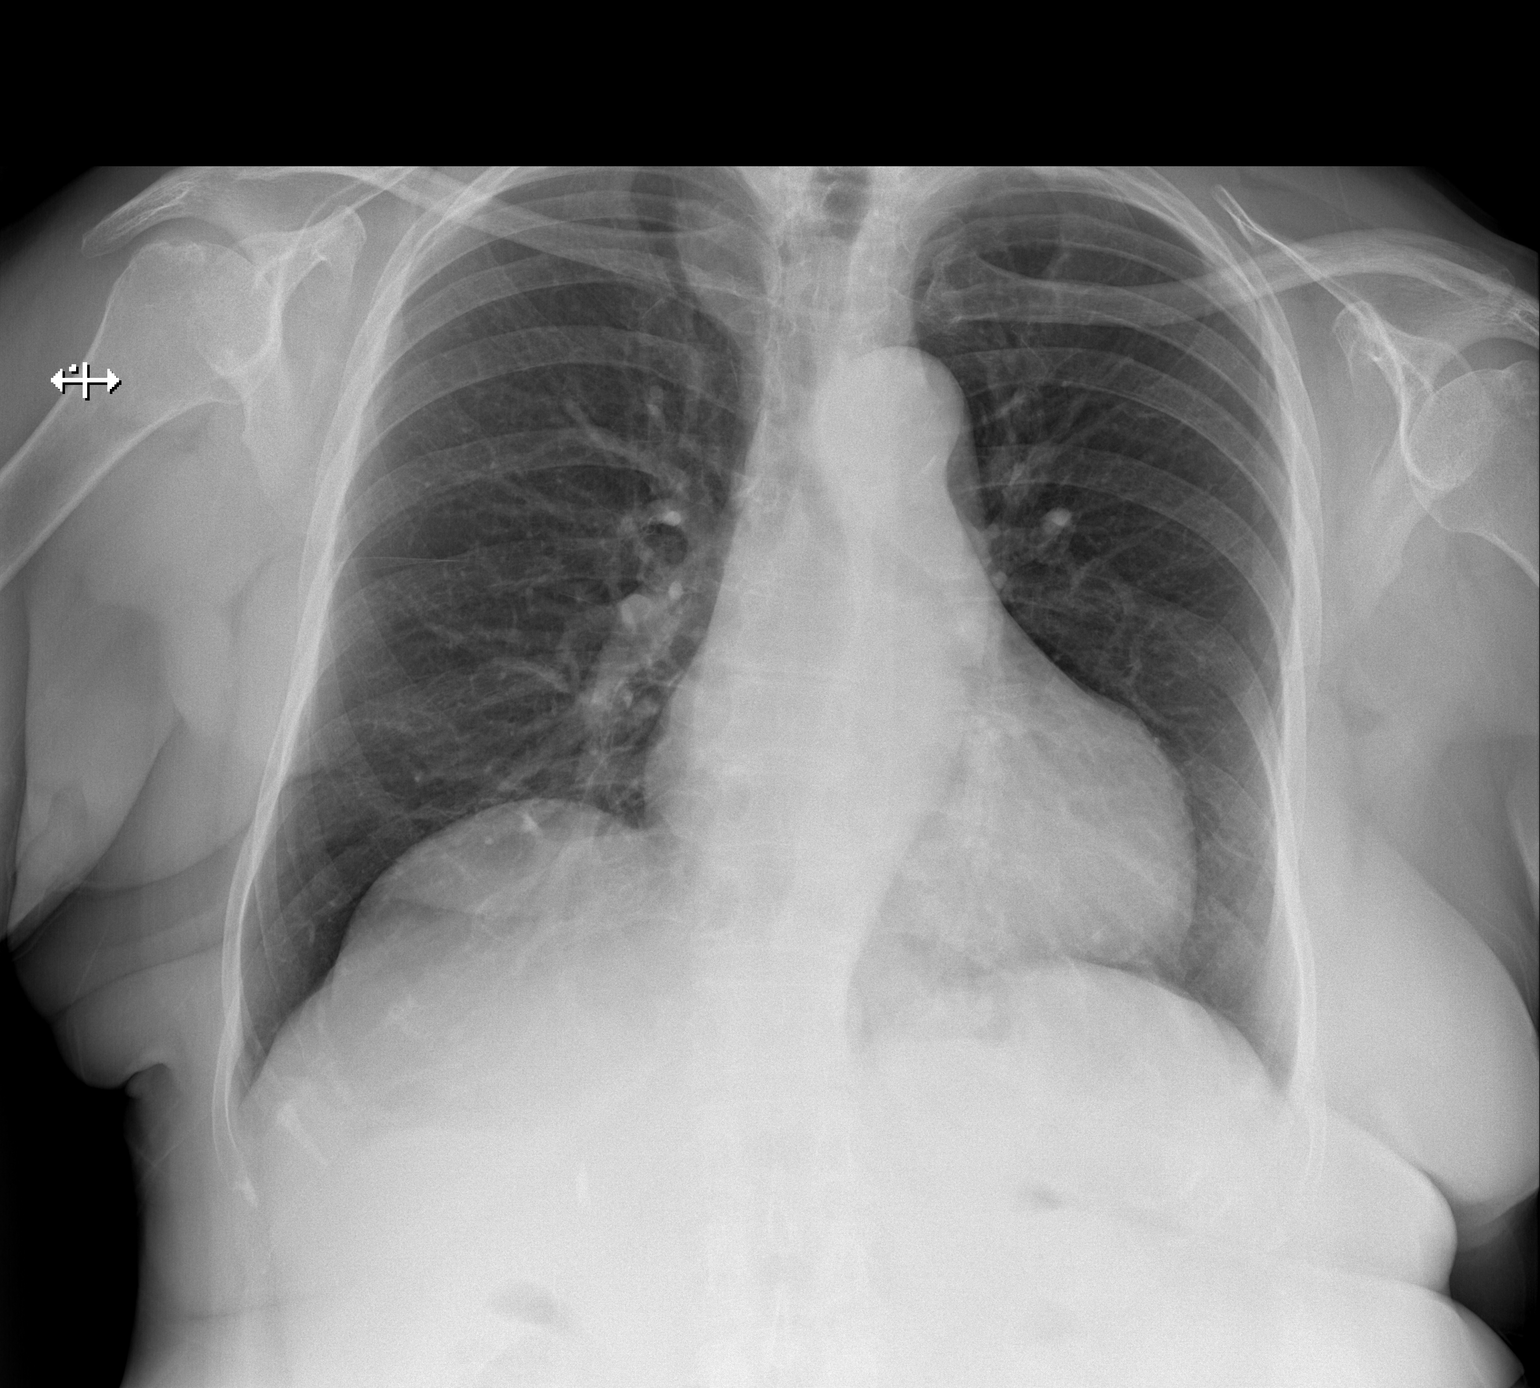

[w chest lat]
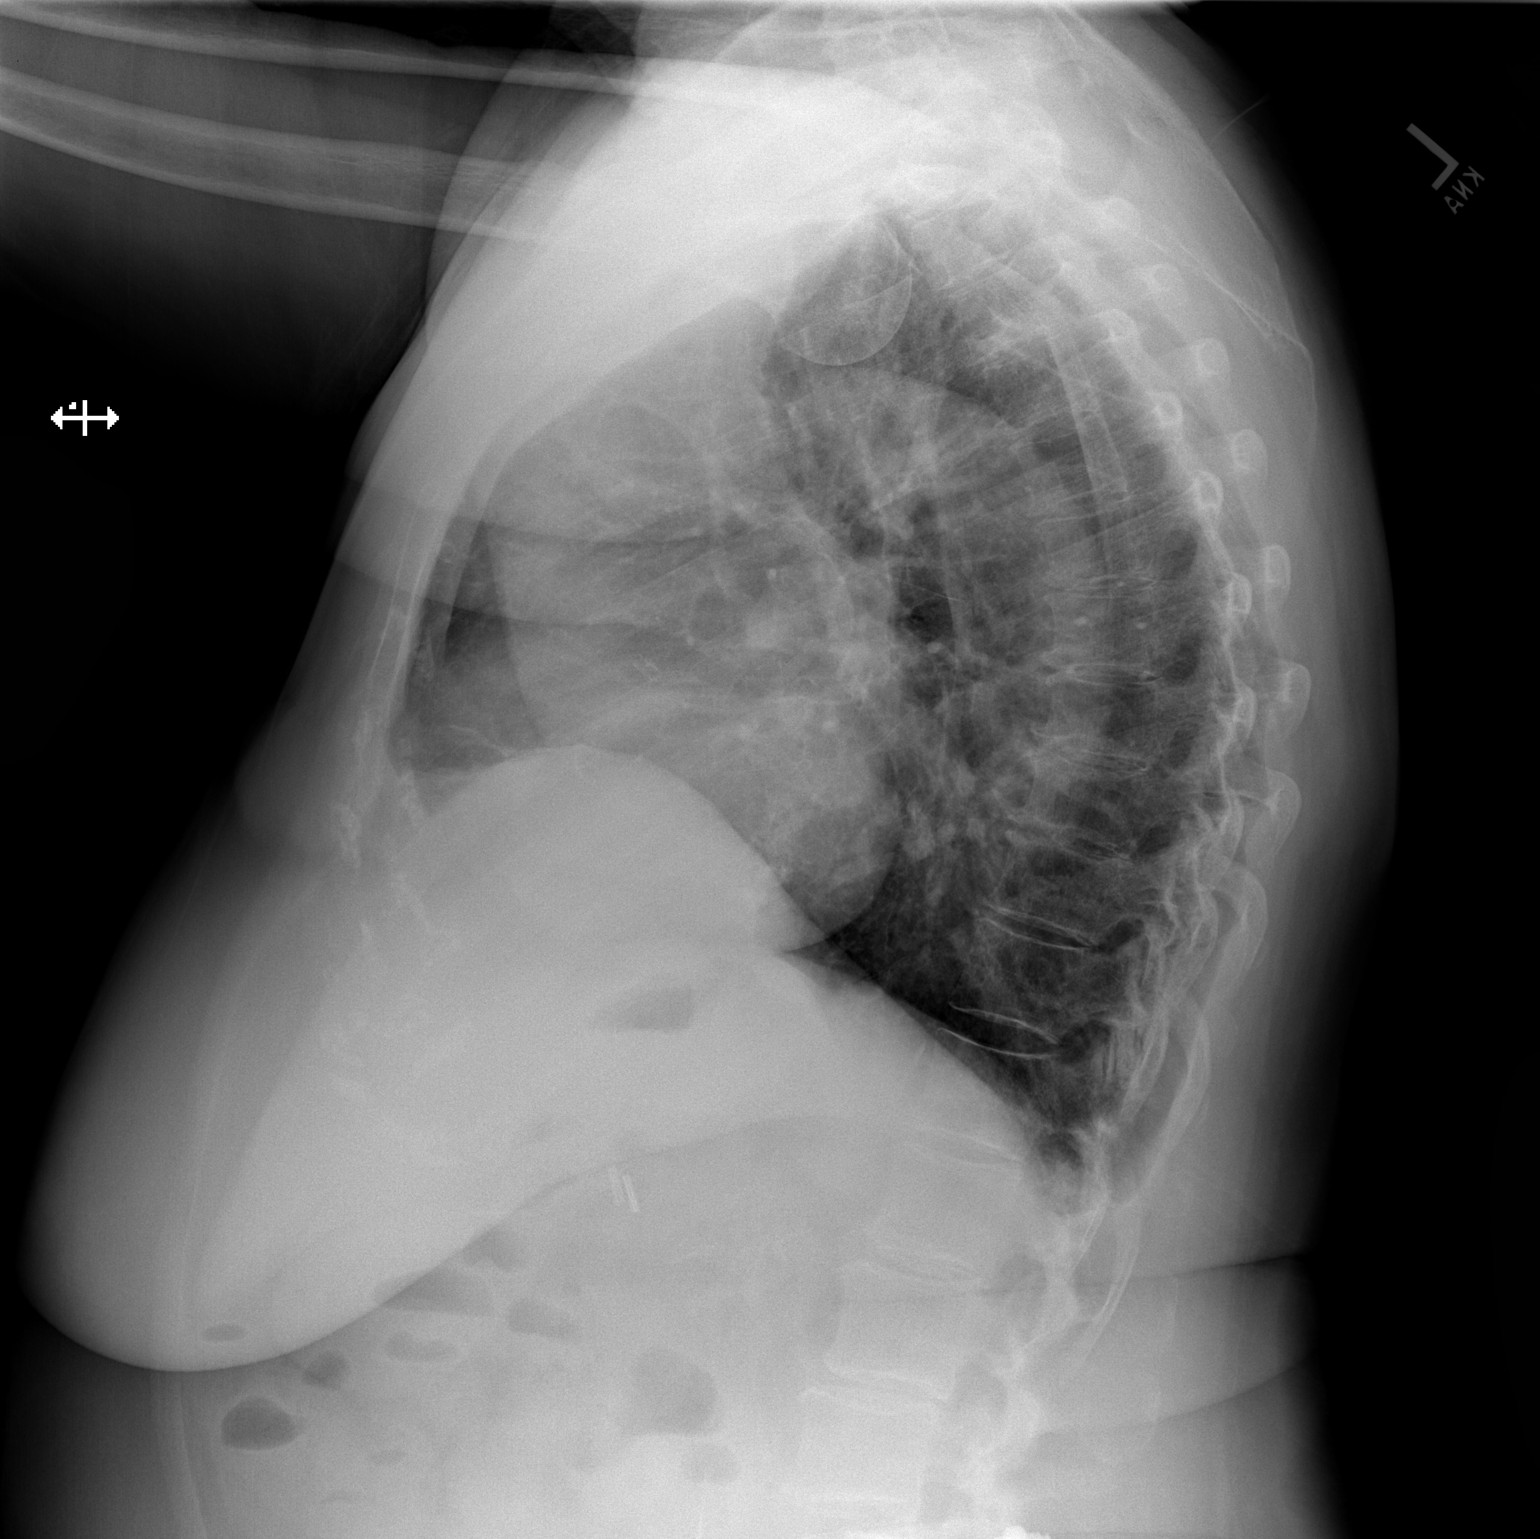

[2 of 2 positions shown; findings below may reference images not displayed]

FINDINGS: The heart size and mediastinal contours are within normal limits.
Both lungs are clear. The visualized skeletal structures are
unremarkable.
IMPRESSION: No active cardiopulmonary disease.

## 2018-06-01 ENCOUNTER — Other Ambulatory Visit: Payer: Self-pay | Admitting: Orthopedic Surgery

## 2018-06-01 DIAGNOSIS — M545 Low back pain, unspecified: Secondary | ICD-10-CM

## 2018-06-10 ENCOUNTER — Ambulatory Visit
Admission: RE | Admit: 2018-06-10 | Discharge: 2018-06-10 | Disposition: A | Discharge status: Home / Self Care | Payer: Medicare Other | Source: Ambulatory Visit | Attending: Orthopedic Surgery | Admitting: Orthopedic Surgery

## 2018-06-10 DIAGNOSIS — M545 Low back pain, unspecified: Secondary | ICD-10-CM

## 2018-12-19 ENCOUNTER — Telehealth: Payer: Self-pay

## 2018-12-19 ENCOUNTER — Telehealth (INDEPENDENT_AMBULATORY_CARE_PROVIDER_SITE_OTHER): Payer: Medicare Other | Admitting: Cardiovascular Disease

## 2018-12-19 VITALS — Ht 63.0 in | Wt 180.0 lb

## 2018-12-19 DIAGNOSIS — I5033 Acute on chronic diastolic (congestive) heart failure: Secondary | ICD-10-CM | POA: Diagnosis not present

## 2018-12-19 DIAGNOSIS — I2510 Atherosclerotic heart disease of native coronary artery without angina pectoris: Secondary | ICD-10-CM

## 2018-12-19 DIAGNOSIS — I359 Nonrheumatic aortic valve disorder, unspecified: Secondary | ICD-10-CM

## 2018-12-19 DIAGNOSIS — I10 Essential (primary) hypertension: Secondary | ICD-10-CM | POA: Diagnosis not present

## 2018-12-19 DIAGNOSIS — I4891 Unspecified atrial fibrillation: Secondary | ICD-10-CM

## 2018-12-19 DIAGNOSIS — E669 Obesity, unspecified: Secondary | ICD-10-CM

## 2018-12-19 DIAGNOSIS — G4733 Obstructive sleep apnea (adult) (pediatric): Secondary | ICD-10-CM

## 2018-12-19 DIAGNOSIS — E039 Hypothyroidism, unspecified: Secondary | ICD-10-CM

## 2018-12-19 DIAGNOSIS — E7800 Pure hypercholesterolemia, unspecified: Secondary | ICD-10-CM

## 2018-12-19 MED ORDER — FUROSEMIDE 40 MG PO TABS: 40.0000 mg | ORAL_TABLET | Freq: Every day | ORAL | 3 refills | Status: DC

## 2018-12-19 NOTE — Patient Instructions (Addendum)
Medication Instructions:  Your physician has recommended you make the following change in your medication:   [place] (MICROZIDE).  STOP TAKING YOUR ASPIRIN.  START FUROSEMIDE (LASIX) 40 MG, ONE TABLET BY MOUTH DAILY.  CONTINUE TAKING YOUR APIXABAN (ELIQUIS) 5 MG, ONE TABLET BY MOUTH TWICE A DAY.  If you need a refill on your cardiac medications before your next appointment, please call your pharmacy.   Lab work: NONE If you have labs (blood work) drawn today and your tests are completely normal, you will receive your results only by: Marland Kitchen MyChart Message (if you have MyChart) OR . A paper copy in the mail If you have any lab test that is abnormal or we need to change your treatment, we will call you to review the results.  Testing/Procedures: Your physician has recommended that you wear a 3 DAY ZIO-PATCH monitor. The Zio patch cardiac monitor continuously records heart rhythm data for up to 14 days, this is for patients being evaluated for multiple types heart rhythms. For the first 24 hours post application, please avoid getting the Zio monitor wet in the shower or by excessive sweating during exercise. After that, feel free to carry on with regular activities. Keep soaps and lotions away from the ZIO XT Patch.  This will be placed at our [facility] location - [address].         Follow-Up: At [facility], you and your health needs are our priority.  As part of our continuing mission to provide you with exceptional heart care, we have created designated Provider Care Teams.  These Care Teams include your primary Cardiologist (physician) and Advanced Practice Providers (APPs -  Physician Assistants and Nurse Practitioners) who all work together to provide you with the care you need, when you need it. You will have an in-office follow up appointment tentatively set up for Dec 27, 2018.  Any Other Special Instructions Will Be Listed Below (If  Applicable). PLEASE PURCHASE A BLOOD PRESSURE MONITOR AS SOON AS POSSIBLE AND CONTACT OUR OFFICE WITH YOUR UPDATED BLOOD PRESSURE AND HEART RATE READINGS.

## 2018-12-19 NOTE — Telephone Encounter (Signed)
lmtcb and review AVS instructions  Letter including After Visit Summary and any other necessary documents to be mailed to the patient's address on file.

## 2018-12-19 NOTE — Progress Notes (Signed)
Virtual Visit via Telephone Note   This visit type was conducted due to national recommendations for restrictions regarding the COVID-19 Pandemic (e.g. social distancing) in an effort to limit this patient's exposure and mitigate transmission in our community.  Due to her co-morbid illnesses, this patient is at least at moderate risk for complications without adequate follow up.  This format is felt to be most appropriate for this patient at this time.  The patient did not have access to video technology/had technical difficulties with video requiring transitioning to audio format only (telephone).  All issues noted in this document were discussed and addressed.  No physical exam could be performed with this format.  Please refer to the patient's chart for her  consent to telehealth for [facility].   Date:  12/20/2018   ID:  [name] [name], DOB [birth year], MRN [id number]  Patient Location: Home Provider Location: Home  PCP:  [facility] [place]  Cardiologist:  New (Emrys Mceachron) Electrophysiologist:  None   Evaluation Performed:  New Patient Evaluation  Chief Complaint:  Dyspnea, atrial fibrillation   History of Present Illness:    [name] [name] is a 85 y.o. female with previous history of coronary artery disease with placement of a drug-eluting stent to the LAD artery in 2006, hypertension, type 2 diabetes mellitus, obesity, mild obstructive sleep apnea, recently diagnosed with atrial fibrillation.  She was previously cared for by Dr. Loralie Champagne but has not seen him since December 2016.  She did not have any cardiology follow-up until a few months ago when she was discovered to have an irregular heart rhythm and was evaluated by Dr. Dwyane Dee at [facility].  At that visit a heart rate of 127 bpm was documented.  Her blood pressure was normal at 132/80 and she weighed 183 pounds.  It is not clear to me whether her dose of beta-blocker was increased.  Electrocardiogram was performed which showed  atrial fibrillation with rapid ventricular response, but this is not immediately available for review.  Anticoagulation with Eliquis was started;  was also continued on aspirin.  She complains of exertional dyspnea simply walking at a slow pace towards her mailbox.  She also describes orthopnea.  She feels very uncomfortable when she first lays down in bed at night, but after while she feels that her breathing calms down.  However, she often gets up in the middle the night and has to take several deep breaths and sit up for a few minutes.  She denies leg edema and her weight has not changed recently.  She denies any problems with angina either at rest or with activity.  Dr. Claris Gladden note from the past state that she never did have angina pectoris, but interestingly she is on a high dose of isosorbide mononitrate which she has been taking for several years.    After her initial stent placement in 2006 she underwent repeat cardiac catheterization in 2010 that did not show any change in anatomy.  She had a patent stent in the proximal LAD artery, a 50% stenosis in the mid LAD artery and an 80% very distal stenosis.  There was also a 50% lesion in the diagonal artery no significant disease in the other 2 major coronary territories.  Had a normal nuclear stress test in the year 2009.  She reports undergoing a sleep study in [place] in the last 3 months.  I can see a study performed on November 08, 2018, but cannot access the data or  the report.  Additional medical problems include breast cancer with surgery in the year 2000 followed by treatment with tamoxifen, lumbar spine surgery and rotator cuff surgery.  The patient does not have symptoms concerning for COVID-19 infection (fever, chills, cough, or new shortness of breath).    Past Medical History:  Diagnosis Date  . Anginal pain ([place])   . Anxiety   . Arthritis    "little in my thumbs"back & knees   . Breast cancer ([place])    R breast, treated with  mastectomy/tomoxifen in early 2000s  . Bronchitis    hx of  . CAD (coronary artery disease)    The patient had a left heart catheterization in August 2006 showed an EF of 70%, a 70% mid LAD lesion and 80% distal LAD ledsion, as well as 50% ostial first diagonal lesion.  The patient did have a drug-eluting stent placed in her mid LAD at that time.    . Depression   . Diastolic heart failure    Echo 11/10 with EF 55-60%, moderate diastolic dysfunction, no regional WMAs, mild to moderate TR, moderate LAE, mild AI  . Frequent urination at night   . GERD (gastroesophageal reflux disease)    "gone since gallbladder took out"  . Heart murmur    "small"  . Hypercholesterolemia   . Hypertension   . Hypothyroidism   . Mental disorder   . Sleep disturbance    sleep study done- told that there wasn't any apnea concerns, states she is up & down for use of bathroom several times per night   . Type II diabetes mellitus ([place])    "controlled w/diet and exercise"  . Urinary incontinence    Past Surgical History:  Procedure Laterality Date  . [place] VITRECTOMY WITH 20 GAUGE MVR PORT Right 06/04/2013   Procedure: 25 GAUGE PARS PLANA VITRECTOMY WITH 20 GAUGE MVR PORT / REMOVAL SILICONE OIL RIGHT EYE. With Endolaser;  Surgeon: Hayden Pedro, MD;  Location: [facility];  Service: Ophthalmology;  Laterality: Right;  . ABDOMINAL HYSTERECTOMY  1964  . Adenosine Myoview     9/09: EF 69% with normal perfusion images suggesting no ischemia or infarction.    . APPENDECTOMY  1954  . BACK SURGERY  06/2015   fusion-lumbar  . BREAST BIOPSY  2001   right  . CARDIAC CATHETERIZATION    . CATARACT EXTRACTION W/ INTRAOCULAR LENS  IMPLANT, BILATERAL  ~ 2000  . CHOLECYSTECTOMY  ~ 2004  . COLONOSCOPY    . CORONARY ANGIOPLASTY    . CORONARY ANGIOPLASTY WITH STENT PLACEMENT  2000's   "1"  . DILATION AND CURETTAGE OF UTERUS  1960's   "I had 3"  . EYE SURGERY     /iol    following cataract removal  .  GAS/FLUID EXCHANGE Right 06/04/2013   Procedure: GAS/FLUID EXCHANGE;  Surgeon: Hayden Pedro, MD;  Location: [facility];  Service: Ophthalmology;  Laterality: Right;  . MASTECTOMY  2001   right breast  . MEMBRANE PEEL Right 06/04/2013   Procedure: MEMBRANE PEEL;  Surgeon: Hayden Pedro, MD;  Location: [facility];  Service: Ophthalmology;  Laterality: Right;  . NEUROPLASTY / TRANSPOSITION MEDIAN NERVE AT CARPAL TUNNEL BILATERAL  1990's  . PARS PLANA VITRECTOMY  04/03/2012   right  . PARS PLANA VITRECTOMY  04/03/2012   Procedure: PARS PLANA VITRECTOMY WITH 25 GAUGE;  Surgeon: Hayden Pedro, MD;  Location: [facility];  Service: Ophthalmology;  Laterality: Right;  Silicone  Oil, Perfluron, Repair Complex Traction Retinal Detachment  . PLANTAR FASCIA RELEASE  1990's   left  . TONSILLECTOMY AND ADENOIDECTOMY  1972  . TOTAL KNEE ARTHROPLASTY Left 10/13/2015   Procedure: TOTAL KNEE ARTHROPLASTY;  Surgeon: Melrose Nakayama, MD;  Location: [facility];  Service: Orthopedics;  Laterality: Left;  . TOTAL KNEE ARTHROPLASTY Right 09/20/2016   Procedure: TOTAL KNEE ARTHROPLASTY;  Surgeon: Melrose Nakayama, MD;  Location: [facility];  Service: Orthopedics;  Laterality: Right;  . ULNAR NERVE REPAIR     left arm     Current Meds  Medication Sig  . apixaban (ELIQUIS) 5 MG TABS tablet Take 1 tablet by mouth 2 (two) times a day.  . calcium carbonate (OS-CAL) 600 MG TABS tablet Take 600 mg by mouth daily with breakfast.  . isosorbide mononitrate (IMDUR) 120 MG 24 hr tablet Take 120 mg by mouth Daily.   Marland Kitchen levothyroxine (SYNTHROID, LEVOTHROID) 50 MCG tablet Take 50 mcg by mouth daily before breakfast.  . losartan (COZAAR) 100 MG tablet Take 1 tablet (100 mg total) by mouth daily.  . methocarbamol (ROBAXIN) 500 MG tablet Take 1 tablet (500 mg total) by mouth every 6 (six) hours as needed for muscle spasms.  . metoprolol (TOPROL-XL) 50 MG 24 hr tablet Take 50 mg by mouth daily.    . traMADol (ULTRAM) 50 MG tablet Take 1 tablet (50 mg total)  by mouth every 6 (six) hours as needed.  . traZODone (DESYREL) 150 MG tablet Take 225 mg by mouth at bedtime.   Marland Kitchen venlafaxine XR (EFFEXOR-XR) 150 MG 24 hr capsule Take 150 mg by mouth 2 (two) times daily.  . [DISCONTINUED] aspirin EC 81 MG tablet Take 81 mg by mouth daily.  . [DISCONTINUED] hydrochlorothiazide (MICROZIDE) 12.5 MG capsule Take 1 capsule (12.5 mg total) by mouth daily.     Allergies:   No known allergies   Social History   Tobacco Use  . Smoking status: Never Smoker  . Smokeless tobacco: Never Used  Substance Use Topics  . Alcohol use: No  . Drug use: No     Family Hx: The patient's family history is not on file.  ROS:   Please see the history of present illness.     All other systems reviewed and are negative.   Prior CV studies:   The following studies were reviewed today:  Report of 11/11/2018 echocardiogram at [facility] showing normal left ventricular systolic function with an ejection fraction of 60-65%, mild left ventricular hypertrophy, grade 2 diastolic dysfunction" (unclear whether this diagnosis was performed while the patient was in atrial fibrillation or sinus rhythm), dilated left atrium 4.5 cm, no significant valvular abnormalities.  Similar findings were described on an echocardiogram from 2014 (EF 60-65%, pseudo-normal filling, systolic PA pressure 35 mmHg, mild aortic insufficiency).  Report of electrocardiogram performed October 17, 2018 showing atrial fibrillation rapid ventricular response, right bundle branch block, left anterior fascicular block.  The EKG is not available for review.  ECG from September 13, 2016 is found in electronic medical record and reviewed personally it shows normal sinus rhythm with nonspecific ST-T segment changes with a QTC of 468 ms.  Another ECG from July 30, 2015 also shows normal sinus rhythm with a QTC of 448 ms.  Labs/Other Tests and Data Reviewed:    EKG:  ECG from September 13, 2016 is found in electronic  medical record (and reviewed personally) it shows normal sinus rhythm with nonspecific ST-T segment changes with a QTC of 468  ms.  Another ECG from July 30, 2015 also shows normal sinus rhythm with a QTC of 448 ms.   Recent Labs: Labs from care everywhere October 17, 2018 show hemoglobin A1c 5.5%, Hemoglobin 15.2, creatinine 0.8, potassium 4.7, normal liver function tests, borderline TSH 3.85  Recent Lipid Panel Lab Results  Component Value Date/Time   CHOL 146 07/30/2015 04:33 PM   TRIG 123 07/30/2015 04:33 PM   HDL 66 07/30/2015 04:33 PM   CHOLHDL 2.2 07/30/2015 04:33 PM   LDLCALC 55 07/30/2015 04:33 PM    Wt Readings from Last 3 Encounters:  12/20/18 180 lb (81.6 kg)  09/13/16 176 lb 14.4 oz (80.2 kg)  10/02/15 165 lb 1.6 oz (74.9 kg)     Objective:    Vital Signs:  Ht 5\' 3"  (1.6 m)   Wt 180 lb (81.6 kg)   BMI 31.89 kg/m    VITAL SIGNS:  reviewed Unable to examine  ASSESSMENT & PLAN:    1. CHF: She has clear-cut symptoms of acute diastolic heart failure including orthopnea and paroxysmal nocturnal dyspnea.  Will transition from hydrochlorothiazide to furosemide.  However, at least per her report she does not have clear-cut signs of hypervolemia and her weight is not meaningfully changed since last month.  Rather than hypervolemia, decompensation may be related to atrial fibrillation, possibly tachycardia related cardiomyopathy, if she is not well rate controlled. Recent echocardiogram shows normal left ventricular systolic function. 2. Afib: She is unable to check her heart rate at this time.  Encouraged her to purchase or borrow a automatic blood pressure cuff which at least will give [place] some idea about her heart rate.  We will also order a 3-day arrhythmia monitor to assess ventricular rate, but it will probably be close to a week until he get those results on the meantime I have asked her to get me a report of her heart rate and blood pressure as soon as possible.  No  indication of acute coronary insufficiency.  Recommend stopping aspirin now that she is taking a direct oral anticoagulant.  Might need cardioversion, but first need to make sure that she has uninterrupted anticoagulation for a minimum of 4 weeks.  Odds of long-term maintenance of sinus rhythm are moderately reduced due to the left atrial dilation and patient's age.  In addition, it is apparent that she may have had atrial fibrillation for the last 3-4 months, based on symptom onset.  May not maintain sinus rhythm without antiarrhythmic therapy. 3. CAD: She does not have angina pectoris.  Unable to review ECG at this time, but there is no mention of ECG repolarization changes on the March 20 study performed at [facility].  May need to reevaluate for ischemia with a Lexiscan Myoview, but this will be a difficult proposition until the coronavirus restrictions are lifted.  I am not sure that she is really benefiting from isosorbide therapy so she does not have angina pectoris. 4. DM 2: well controlled. 5. HLP: on statin, LDL was 61 when last checked in 2016. 6. Hypothyroidism: On supplement with very mild elevation in TSH. 7. HTN: Blood pressure was well controlled at a recent office visit.  Unable to check today. 8. Mild obesity, possible mild OSA   COVID-19 Education: The signs and symptoms of COVID-19 were discussed with the patient and how to seek care for testing (follow up with PCP or arrange E-visit).  The importance of social distancing was discussed today.  Time:   Today, I have spent  40 minutes with the patient with telehealth technology discussing the above problems.     Medication Adjustments/Labs and Tests Ordered: Current medicines are reviewed at length with the patient today.  Concerns regarding medicines are outlined above.   Tests Ordered: Orders Placed This Encounter  Procedures  . LONG TERM MONITOR (3-14 DAYS)    Medication Changes: Meds ordered this encounter  Medications  .  furosemide (LASIX) 40 MG tablet    Sig: Take 1 tablet (40 mg total) by mouth daily.    Dispense:  90 tablet    Refill:  3  Stop aspirin, stop hydrochlorothiazide  Disposition:  Follow up 1 month  Signed, [name], MD  12/20/2018 2:31 PM    [place]

## 2018-12-20 DIAGNOSIS — I4891 Unspecified atrial fibrillation: Secondary | ICD-10-CM | POA: Insufficient documentation

## 2018-12-20 DIAGNOSIS — I480 Paroxysmal atrial fibrillation: Secondary | ICD-10-CM | POA: Insufficient documentation

## 2018-12-24 ENCOUNTER — Telehealth: Payer: Medicare Other | Admitting: Cardiovascular Disease

## 2018-12-25 ENCOUNTER — Telehealth: Payer: Self-pay | Admitting: *Deleted

## 2018-12-25 NOTE — Telephone Encounter (Signed)
[employer] to mail 3 day ZIO XT long term holter monitor to her home.  Instructions reviewe briefly as they are included in the monitor kit.

## 2019-01-04 ENCOUNTER — Ambulatory Visit (INDEPENDENT_AMBULATORY_CARE_PROVIDER_SITE_OTHER): Payer: Medicare Other

## 2019-01-04 DIAGNOSIS — I4891 Unspecified atrial fibrillation: Secondary | ICD-10-CM | POA: Diagnosis not present

## 2019-01-29 ENCOUNTER — Telehealth: Payer: Self-pay | Admitting: *Deleted

## 2019-01-29 NOTE — Telephone Encounter (Signed)
Attempted to reach the patient but the voicemail was full. She has not mailed back her Zio monitor.

## 2019-01-31 ENCOUNTER — Other Ambulatory Visit: Payer: Self-pay

## 2019-02-01 NOTE — Telephone Encounter (Signed)
Attempted to reach the patient. Voicemail was full.  

## 2019-02-02 NOTE — Telephone Encounter (Signed)
Monitor has been received.

## 2019-02-04 ENCOUNTER — Telehealth: Payer: Self-pay | Admitting: *Deleted

## 2019-02-04 NOTE — Telephone Encounter (Addendum)
Spoke with pt, Patient voiced understanding of medication change. Most recent labs available in care everywhere. Tsh=4.480. will make dr c aware.   ----- Message from [name], MD sent at 02/01/2019  7:09 PM EDT ----- AFib with frequent RVR on monitor. Please increase the metoprolol to 75 mg (one and a half tabs every AM). Please also ask if she has recently had her TSH checked.

## 2019-02-04 NOTE — Telephone Encounter (Signed)
Thanks

## 2019-03-12 ENCOUNTER — Encounter (HOSPITAL_COMMUNITY): Payer: Self-pay | Admitting: Emergency Medicine

## 2019-03-12 ENCOUNTER — Emergency Department (HOSPITAL_COMMUNITY): Payer: Medicare Other

## 2019-03-12 ENCOUNTER — Other Ambulatory Visit: Payer: Self-pay

## 2019-03-12 ENCOUNTER — Telehealth: Payer: Self-pay | Admitting: Cardiology

## 2019-03-12 ENCOUNTER — Inpatient Hospital Stay (HOSPITAL_COMMUNITY)
Admission: EM | Admit: 2019-03-12 | Discharge: 2019-03-16 | DRG: 286 | Disposition: A | Discharge status: Home / Self Care | Payer: Medicare Other | Attending: Internal Medicine | Admitting: Internal Medicine

## 2019-03-12 DIAGNOSIS — R509 Fever, unspecified: Secondary | ICD-10-CM | POA: Diagnosis not present

## 2019-03-12 DIAGNOSIS — I2510 Atherosclerotic heart disease of native coronary artery without angina pectoris: Secondary | ICD-10-CM | POA: Diagnosis present

## 2019-03-12 DIAGNOSIS — Z853 Personal history of malignant neoplasm of breast: Secondary | ICD-10-CM

## 2019-03-12 DIAGNOSIS — Z7982 Long term (current) use of aspirin: Secondary | ICD-10-CM

## 2019-03-12 DIAGNOSIS — I480 Paroxysmal atrial fibrillation: Secondary | ICD-10-CM | POA: Diagnosis present

## 2019-03-12 DIAGNOSIS — I5033 Acute on chronic diastolic (congestive) heart failure: Secondary | ICD-10-CM | POA: Diagnosis present

## 2019-03-12 DIAGNOSIS — I4891 Unspecified atrial fibrillation: Secondary | ICD-10-CM | POA: Diagnosis not present

## 2019-03-12 DIAGNOSIS — I4510 Unspecified right bundle-branch block: Secondary | ICD-10-CM | POA: Diagnosis present

## 2019-03-12 DIAGNOSIS — Z7901 Long term (current) use of anticoagulants: Secondary | ICD-10-CM

## 2019-03-12 DIAGNOSIS — I10 Essential (primary) hypertension: Secondary | ICD-10-CM | POA: Diagnosis not present

## 2019-03-12 DIAGNOSIS — E669 Obesity, unspecified: Secondary | ICD-10-CM | POA: Diagnosis present

## 2019-03-12 DIAGNOSIS — E119 Type 2 diabetes mellitus without complications: Secondary | ICD-10-CM | POA: Diagnosis present

## 2019-03-12 DIAGNOSIS — I5032 Chronic diastolic (congestive) heart failure: Secondary | ICD-10-CM

## 2019-03-12 DIAGNOSIS — E785 Hyperlipidemia, unspecified: Secondary | ICD-10-CM | POA: Diagnosis present

## 2019-03-12 DIAGNOSIS — E039 Hypothyroidism, unspecified: Secondary | ICD-10-CM | POA: Diagnosis present

## 2019-03-12 DIAGNOSIS — Y831 Surgical operation with implant of artificial internal device as the cause of abnormal reaction of the patient, or of later complication, without mention of misadventure at the time of the procedure: Secondary | ICD-10-CM | POA: Diagnosis present

## 2019-03-12 DIAGNOSIS — E876 Hypokalemia: Secondary | ICD-10-CM | POA: Diagnosis not present

## 2019-03-12 DIAGNOSIS — R0789 Other chest pain: Secondary | ICD-10-CM

## 2019-03-12 DIAGNOSIS — Z6832 Body mass index (BMI) 32.0-32.9, adult: Secondary | ICD-10-CM

## 2019-03-12 DIAGNOSIS — I110 Hypertensive heart disease with heart failure: Secondary | ICD-10-CM | POA: Diagnosis present

## 2019-03-12 DIAGNOSIS — T82855A Stenosis of coronary artery stent, initial encounter: Secondary | ICD-10-CM | POA: Diagnosis present

## 2019-03-12 DIAGNOSIS — I4819 Other persistent atrial fibrillation: Principal | ICD-10-CM | POA: Diagnosis present

## 2019-03-12 DIAGNOSIS — I25110 Atherosclerotic heart disease of native coronary artery with unstable angina pectoris: Secondary | ICD-10-CM | POA: Diagnosis present

## 2019-03-12 DIAGNOSIS — Z20828 Contact with and (suspected) exposure to other viral communicable diseases: Secondary | ICD-10-CM | POA: Diagnosis present

## 2019-03-12 DIAGNOSIS — Z96653 Presence of artificial knee joint, bilateral: Secondary | ICD-10-CM | POA: Diagnosis present

## 2019-03-12 DIAGNOSIS — I248 Other forms of acute ischemic heart disease: Secondary | ICD-10-CM | POA: Diagnosis present

## 2019-03-12 LAB — URINALYSIS, ROUTINE W REFLEX MICROSCOPIC
Bilirubin Urine: NEGATIVE
Glucose, UA: NEGATIVE mg/dL
Ketones, ur: NEGATIVE mg/dL
Nitrite: NEGATIVE
Protein, ur: 30 mg/dL — AB
Specific Gravity, Urine: 1.017 (ref 1.005–1.030)
pH: 5 (ref 5.0–8.0)

## 2019-03-12 LAB — MAGNESIUM: Magnesium: 2.2 mg/dL (ref 1.7–2.4)

## 2019-03-12 LAB — TSH: TSH: 7.938 u[IU]/mL — ABNORMAL HIGH (ref 0.350–4.500)

## 2019-03-12 LAB — CBC
HCT: 50.1 % — ABNORMAL HIGH (ref 36.0–46.0)
Hemoglobin: 16.8 g/dL — ABNORMAL HIGH (ref 12.0–15.0)
MCH: 31.2 pg (ref 26.0–34.0)
MCHC: 33.5 g/dL (ref 30.0–36.0)
MCV: 92.9 fL (ref 80.0–100.0)
Platelets: 274 10*3/uL (ref 150–400)
RBC: 5.39 MIL/uL — ABNORMAL HIGH (ref 3.87–5.11)
RDW: 13.3 % (ref 11.5–15.5)
WBC: 7.3 10*3/uL (ref 4.0–10.5)
nRBC: 0 % (ref 0.0–0.2)

## 2019-03-12 LAB — TROPONIN I (HIGH SENSITIVITY)
Troponin I (High Sensitivity): 4 ng/L (ref <18)
Troponin I (High Sensitivity): 4 ng/L (ref <18)

## 2019-03-12 LAB — BASIC METABOLIC PANEL
Anion gap: 14 (ref 5–15)
BUN: 12 mg/dL (ref 8–23)
CO2: 24 mmol/L (ref 22–32)
Calcium: 10.2 mg/dL (ref 8.9–10.3)
Chloride: 101 mmol/L (ref 98–111)
Creatinine, Ser: 0.88 mg/dL (ref 0.44–1.00)
GFR calc Af Amer: >60 mL/min (ref >60)
GFR calc non Af Amer: >60 mL/min (ref >60)
Glucose, Bld: 94 mg/dL (ref 70–99)
Potassium: 3.9 mmol/L (ref 3.5–5.1)
Sodium: 139 mmol/L (ref 135–145)

## 2019-03-12 LAB — LACTIC ACID, PLASMA: Lactic Acid, Venous: 1.6 mmol/L (ref 0.5–1.9)

## 2019-03-12 LAB — SARS CORONAVIRUS 2 BY RT PCR (HOSPITAL ORDER, PERFORMED IN ~~LOC~~ HOSPITAL LAB): SARS Coronavirus 2: NEGATIVE

## 2019-03-12 MED ORDER — METOPROLOL SUCCINATE ER 50 MG PO TB24
50.0000 mg | ORAL_TABLET | Freq: Every day | ORAL | Status: DC
  Administered 2019-03-12 – 2019-03-16 (×5): 50 mg via ORAL
  Filled 2019-03-12 (×2): qty 1
  Filled 2019-03-12 (×2): qty 2
  Filled 2019-03-12: qty 1

## 2019-03-12 MED ORDER — ACETAMINOPHEN 325 MG PO TABS
650.0000 mg | ORAL_TABLET | Freq: Once | ORAL | Status: AC
  Administered 2019-03-12: 650 mg via ORAL
  Filled 2019-03-12: qty 2

## 2019-03-12 MED ORDER — IOHEXOL 350 MG/ML SOLN
75.0000 mL | Freq: Once | INTRAVENOUS | Status: AC | PRN
  Administered 2019-03-12: 75 mL via INTRAVENOUS

## 2019-03-12 MED ORDER — SODIUM CHLORIDE 0.9% FLUSH
3.0000 mL | Freq: Once | INTRAVENOUS | Status: AC
  Administered 2019-03-12: 3 mL via INTRAVENOUS

## 2019-03-12 MED ORDER — SODIUM CHLORIDE 0.9 % IV BOLUS
1000.0000 mL | Freq: Once | INTRAVENOUS | Status: AC
  Administered 2019-03-12: 1000 mL via INTRAVENOUS

## 2019-03-12 NOTE — Telephone Encounter (Signed)
Patient reports shortness of breath for 1 week, denies chest pain. Patient's daughter got on phone and adds that pt does report chest pressure, feeling like an elephant is sitting on her chest and is clammy. Instructed to go to the ER for evaluation. Daughter verbalizes understanding.

## 2019-03-12 NOTE — ED Triage Notes (Signed)
Pt arrives with daughter from home with c/o of chest heaviness and periods of breaking out into a sweat.

## 2019-03-12 NOTE — ED Provider Notes (Signed)
[facility] Provider Note   CSN: [id number] Arrival date & time: 03/12/19  1605    History   Chief Complaint Chief Complaint  Patient presents with   Chest Pain    HPI [name] [name] is a 85 y.o. female.     HPI   [name] [name] is a 85 y.o. female, with a history of CAD, diastolic heart failure, A. fib, GERD, HTN, hyperlipidemia, hypothyroidism, DM, presenting to the ED with chest discomfort beginning two days ago.  "Feels like an elephant standing on the chest," waxing and waning, central chest, nonradiating, comes on at rest, will have spikes in discomfort "that takes my breath" lasting less than a minutes. Feels hotter than normal over the same time period. She has not had this issue before.  Later in the ED course, she notes she notes she took her Eliquis this morning, however, did not take any of her other medications. Denies known fever, abdominal pain, syncope, cough, urinary symptoms, N/V/D, or any other complaints.      Past Medical History:  Diagnosis Date   Anginal pain ([place])    Anxiety    Arthritis    "little in my thumbs"back & knees    Breast cancer ([place])    R breast, treated with mastectomy/tomoxifen in early 2000s   Bronchitis    hx of   CAD (coronary artery disease)    The patient had a left heart catheterization in August 2006 showed an EF of 70%, a 70% mid LAD lesion and 80% distal LAD ledsion, as well as 50% ostial first diagonal lesion.  The patient did have a drug-eluting stent placed in her mid LAD at that time.     Depression    Diastolic heart failure    Echo 11/10 with EF 55-60%, moderate diastolic dysfunction, no regional WMAs, mild to moderate TR, moderate LAE, mild AI   Frequent urination at night    GERD (gastroesophageal reflux disease)    "gone since gallbladder took out"   Heart murmur    "small"   Hypercholesterolemia    Hypertension    Hypothyroidism    Mental disorder     Sleep disturbance    sleep study done- told that there wasn't any apnea concerns, states she is up & down for use of bathroom several times per night    Type II diabetes mellitus ([place])    "controlled w/diet and exercise"   Urinary incontinence     Patient Active Problem List   Diagnosis Date Noted   Atrial fibrillation with rapid ventricular response ([place]) 12/20/2018   Primary localized osteoarthritis of right knee 09/20/2016   Primary osteoarthritis of right knee 09/20/2016   Primary osteoarthritis of left knee 10/13/2015   Radiculopathy 07/08/2015   Preretinal fibrosis, 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   Preoperative clearance 03/20/2013   Acute on chronic diastolic heart failure ([place]) 07/03/2009   Aortic valve disorder 06/17/2009   UNSPECIFIED HYPOTHYROIDISM 07/28/2008   Hyperlipidemia 07/28/2008   Essential hypertension 07/28/2008   CAD, NATIVE VESSEL 07/28/2008    Past Surgical History:  Procedure Laterality Date   25 GAUGE PARS PLANA VITRECTOMY WITH 20 GAUGE MVR PORT Right 06/04/2013   Procedure: 25 GAUGE PARS PLANA VITRECTOMY WITH 20 GAUGE MVR PORT / REMOVAL SILICONE OIL RIGHT EYE. With Endolaser;  Surgeon: Hayden Pedro, MD;  Location: [facility];  Service: Ophthalmology;  Laterality: Right;   ABDOMINAL HYSTERECTOMY  1964   Adenosine Myoview     9/09:  EF 69% with normal perfusion images suggesting no ischemia or infarction.     [place] SURGERY  06/2015   fusion-lumbar   BREAST BIOPSY  2001   right   CARDIAC CATHETERIZATION     CATARACT EXTRACTION W/ INTRAOCULAR LENS  IMPLANT, BILATERAL  ~ 2000   CHOLECYSTECTOMY  ~ 2004   COLONOSCOPY     CORONARY ANGIOPLASTY     CORONARY ANGIOPLASTY WITH STENT PLACEMENT  2000's   "1"   DILATION AND CURETTAGE OF UTERUS  1960's   "I had 3"   EYE SURGERY     /iol    following cataract removal   GAS/FLUID EXCHANGE Right 06/04/2013   Procedure: GAS/FLUID EXCHANGE;  Surgeon: Hayden Pedro, MD;   Location: [facility];  Service: Ophthalmology;  Laterality: Right;   MASTECTOMY  2001   right breast   MEMBRANE PEEL Right 06/04/2013   Procedure: MEMBRANE PEEL;  Surgeon: Hayden Pedro, MD;  Location: [facility];  Service: Ophthalmology;  Laterality: Right;   NEUROPLASTY / TRANSPOSITION MEDIAN NERVE AT CARPAL TUNNEL BILATERAL  1990's   PARS PLANA VITRECTOMY  04/03/2012   right   PARS PLANA VITRECTOMY  04/03/2012   Procedure: PARS PLANA VITRECTOMY WITH 25 GAUGE;  Surgeon: Hayden Pedro, MD;  Location: [facility];  Service: Ophthalmology;  Laterality: Right;  Silicone Oil, Perfluron, Repair Complex Traction Retinal Detachment   PLANTAR FASCIA RELEASE  1990's   left   TONSILLECTOMY AND ADENOIDECTOMY  1972   TOTAL KNEE ARTHROPLASTY Left 10/13/2015   Procedure: TOTAL KNEE ARTHROPLASTY;  Surgeon: Melrose Nakayama, MD;  Location: [facility];  Service: Orthopedics;  Laterality: Left;   TOTAL KNEE ARTHROPLASTY Right 09/20/2016   Procedure: TOTAL KNEE ARTHROPLASTY;  Surgeon: Melrose Nakayama, MD;  Location: [facility];  Service: Orthopedics;  Laterality: Right;   ULNAR NERVE REPAIR     left arm     OB History   No obstetric history on file.      Home Medications    Prior to Admission medications   Medication Sig Start Date End Date Taking? Authorizing Provider  apixaban (ELIQUIS) 5 MG TABS tablet Take 5 mg by mouth 2 (two) times daily.  10/17/18  Yes [provider]  aspirin EC 81 MG tablet Take 81 mg by mouth daily.   Yes [provider]  calcium carbonate (OS-CAL) 600 MG TABS tablet Take 600 mg by mouth daily with breakfast.   Yes [provider]  furosemide (LASIX) 40 MG tablet Take 1 tablet (40 mg total) by mouth daily. 12/19/18 03/19/19 Yes Croitoru, Mihai, MD  hydrochlorothiazide (MICROZIDE) 12.5 MG capsule Take 12.5 mg by mouth daily.   Yes [provider]  isosorbide mononitrate (IMDUR) 120 MG 24 hr tablet Take 120 mg by mouth Daily.  04/19/11  Yes [provider]   levothyroxine (SYNTHROID) 75 MCG tablet Take 75 mcg by mouth daily. 01/25/19  Yes [provider]  losartan (COZAAR) 100 MG tablet Take 1 tablet (100 mg total) by mouth daily. 07/01/11  Yes Larey Dresser, MD  metoprolol tartrate (LOPRESSOR) 50 MG tablet Take 50 mg by mouth 2 (two) times a day. 11/17/18  Yes [provider]  traZODone (DESYREL) 150 MG tablet Take 225 mg by mouth at bedtime.    Yes [provider]  venlafaxine XR (EFFEXOR-XR) 150 MG 24 hr capsule Take 150 mg by mouth daily with breakfast.   Yes [provider]  methocarbamol (ROBAXIN) 500 MG tablet  Take 1 tablet (500 mg total) by mouth every 6 (six) hours as needed for muscle spasms. Patient not taking: Reported on 03/12/2019 09/23/16   Loni Dolly, PA-C  traMADol (ULTRAM) 50 MG tablet Take 1 tablet (50 mg total) by mouth every 6 (six) hours as needed. Patient not taking: Reported on 03/12/2019 09/23/16   Loni Dolly, PA-C    Family History No family history on file.  Social History Social History   Tobacco Use   Smoking status: Never Smoker   Smokeless tobacco: Never Used  Substance Use Topics   Alcohol use: No   Drug use: No     Allergies   No known allergies   Review of Systems Review of Systems  Constitutional: Negative for chills, diaphoresis and fever.  Respiratory: Positive for shortness of breath. Negative for cough.   Cardiovascular: Positive for chest pain. Negative for leg swelling.  Gastrointestinal: Negative for abdominal pain, blood in stool, diarrhea, nausea and vomiting.  Neurological: Negative for dizziness, syncope and light-headedness.  All other systems reviewed and are negative.    Physical Exam Updated Vital Signs BP 109/60 (BP Location: Right Arm)    Pulse (!) 112    Temp 98.2 F (36.8 C) (Oral)    Resp 16    SpO2 99%   Physical Exam Vitals signs and nursing note reviewed.  Constitutional:      General: She is not in acute distress.     Appearance: She is well-developed. She is not diaphoretic.  HENT:     Head: Normocephalic and atraumatic.     Mouth/Throat:     Mouth: Mucous membranes are moist.     Pharynx: Oropharynx is clear.  Eyes:     Conjunctiva/sclera: Conjunctivae normal.  Neck:     Musculoskeletal: Neck supple.  Cardiovascular:     Rate and Rhythm: Tachycardia present. Rhythm irregularly irregular.     Pulses: Normal pulses.          Radial pulses are 2+ on the right side and 2+ on the left side.       Posterior tibial pulses are 2+ on the right side and 2+ on the left side.     Heart sounds: Normal heart sounds.     Comments: Tactile temperature in the extremities appropriate and equal bilaterally. Pulmonary:     Effort: Pulmonary effort is normal. No respiratory distress.     Breath sounds: Normal breath sounds.  Abdominal:     Palpations: Abdomen is soft.     Tenderness: There is no abdominal tenderness. There is no guarding.  Musculoskeletal:     Right lower leg: No edema.     Left lower leg: No edema.  Lymphadenopathy:     Cervical: No cervical adenopathy.  Skin:    General: Skin is warm and dry.  Neurological:     Mental Status: She is alert.  Psychiatric:        Mood and Affect: Mood and affect normal.        Speech: Speech normal.        Behavior: Behavior normal.      ED Treatments / Results  Labs (all labs ordered are listed, but only abnormal results are displayed) Labs Reviewed  CBC - Abnormal; Notable for the following components:      Result Value   RBC 5.39 (*)    Hemoglobin 16.8 (*)    HCT 50.1 (*)    All other components within normal limits  URINALYSIS, ROUTINE W REFLEX  MICROSCOPIC - Abnormal; Notable for the following components:   Hgb urine dipstick MODERATE (*)    Protein, ur 30 (*)    Leukocytes,Ua SMALL (*)    Bacteria, UA RARE (*)    All other components within normal limits  TSH - Abnormal; Notable for the following components:   TSH 7.938 (*)    All other  components within normal limits  SARS CORONAVIRUS 2 (HOSPITAL ORDER, [facility])  CULTURE, BLOOD (ROUTINE X 2)  CULTURE, BLOOD (ROUTINE X 2)  URINE CULTURE  BASIC METABOLIC PANEL  LACTIC ACID, PLASMA  MAGNESIUM  TROPONIN I (HIGH SENSITIVITY)  TROPONIN I (HIGH SENSITIVITY)    EKG EKG Interpretation  Date/Time:  Tuesday March 12 2019 16:19:07 EDT Ventricular Rate:  112 PR Interval:    QRS Duration: 126 QT Interval:  326 QTC Calculation: 444 R Axis:   -71 Text Interpretation:  Atrial fibrillation with rapid ventricular response Left axis deviation Right bundle branch block Abnormal ECG Confirmed by Pattricia Boss [phone]) on 03/12/2019 5:01:53 PM   Radiology Ct Angio Chest Pe W And/or Wo Contrast  Result Date: 03/12/2019 CLINICAL DATA:  Chest heaviness and diaphoresis EXAM: CT ANGIOGRAPHY CHEST WITH CONTRAST TECHNIQUE: Multidetector CT imaging of the chest was performed using the standard protocol during bolus administration of intravenous contrast. Multiplanar CT image reconstructions and MIPs were obtained to evaluate the vascular anatomy. CONTRAST:  61mL OMNIPAQUE IOHEXOL 350 MG/ML SOLN COMPARISON:  03/12/2019 radiograph FINDINGS: Cardiovascular: Satisfactory opacification of the pulmonary arteries to the segmental level. No evidence of pulmonary embolism. Nonaneurysmal aorta. Mild aortic atherosclerosis. Coronary vascular calcification. Mild cardiomegaly. No pericardial effusion. Mediastinum/Nodes: Midline trachea. No thyroid mass. No significant adenopathy. Small distal esophageal hiatal hernia Lungs/Pleura: Lungs are clear. No pleural effusion or pneumothorax. Upper Abdomen: No acute abnormality. Musculoskeletal: Degenerative changes. Incompletely visualized superior endplate fracture at T65. Review of the MIP images confirms the above findings. IMPRESSION: 1. Negative for acute pulmonary embolus. 2. Clear lung fields Aortic Atherosclerosis (ICD10-I70.0).  Electronically Signed   By: Donavan Foil M.D.   On: 03/12/2019 21:46   Dg Chest Portable 1 View  Result Date: 03/12/2019 CLINICAL DATA:  Chest discomfort EXAM: PORTABLE CHEST 1 VIEW COMPARISON:  September 13, 2016 FINDINGS: There is no edema or consolidation. Heart is mildly enlarged with pulmonary vascularity normal. Soft tissue prominence right paratracheal region is stable and felt to represent great vessel prominence. No adenopathy. No pneumothorax. No bone lesions. IMPRESSION: No edema or consolidation. Stable cardiac silhouette. No evident adenopathy. Electronically Signed   By: Lowella Grip III M.D.   On: 03/12/2019 18:25    Procedures Procedures (including critical care time)  Medications Ordered in ED Medications  metoprolol succinate (TOPROL-XL) 24 hr tablet 50 mg (50 mg Oral Given 03/12/19 1856)  sodium chloride flush (NS) 0.9 % injection 3 mL (3 mLs Intravenous Given 03/12/19 1857)  sodium chloride 0.9 % bolus 1,000 mL (0 mLs Intravenous Stopped 03/12/19 1930)  acetaminophen (TYLENOL) tablet 650 mg (650 mg Oral Given 03/12/19 1856)  iohexol (OMNIPAQUE) 350 MG/ML injection 75 mL (75 mLs Intravenous Contrast Given 03/12/19 2114)     Initial Impression / Assessment and Plan / ED Course  I have reviewed the triage vital signs and the nursing notes.  Pertinent labs & imaging results that were available during my care of the patient were reviewed by me and considered in my medical decision making (see chart for details).  Clinical Course as of Mar 11 2341  Tue  Mar 12, 2019  2225 Discussed CT results with the patient and her daughter at the bedside.  Patient's chest pressure has completely resolved.   [SJ]  2253 Spoke with Dr. Emilio Aspen, Cardiology Fellow.  They will see the the patient tonight or tomorrow morning.  Goals of care will be better rate control at baseline.  No further recommendations at this time.   [SJ]  2313 Spoke with Dr. Maudie Mercury, hospitalist.  Agrees to admit the  patient.   [SJ]    Clinical Course User Index [SJ] Yordi Krager C, PA-C       Patient presents with complaint of chest pressure recurrent over the last 2 days. Patient is nontoxic appearing, not tachypneic, not hypotensive, maintains excellent SPO2 on room air, and is in no apparent distress.  She is mildly tachycardic with EKG showing A. fib RVR.  Her temperature is mildly elevated to 100.1 F.  I have not been able to identify a source of infection for the patient. No acute abnormalities on chest x-ray or chest CT.  Delta troponins negative. Per patient, she has a distant history of cardiac cath, however, we do not have these records available in the system. Patient admitted for further management.  Findings and plan of care discussed with Pattricia Boss, MD. Dr. Jeanell Sparrow personally evaluated and examined this patient.  Vitals:   03/12/19 2030 03/12/19 2100 03/12/19 2300 03/12/19 2331  BP: (!) 143/132 (!) 156/106 (!) 155/113 (!) 179/149  Pulse: (!) 111 (!) 102 (!) 55   Resp: (!) 23 17    Temp:      TempSrc:      SpO2: 99% 98% 97%      Final Clinical Impressions(s) / ED Diagnoses   Final diagnoses:  Chest pressure    ED Discharge Orders    None       Layla Maw 03/12/19 2351    Pattricia Boss, MD 03/14/19 [phone]

## 2019-03-12 NOTE — H&P (Addendum)
TRH H&P    Patient Demographics:    [name] [name], is a 85 y.o. female  MRN: [id number]  DOB - 22-Dec-1933  Admit Date - 03/12/2019  Referring MD/NP/PA:  Arlean Hopping  Outpatient Primary MD for the patient is [facility] [place] - cardiology  Patient coming from:  home  Chief complaint-  Chest pain    HPI:    [name] [name]  is a 85 y.o. female, w remote hx of breast cancer s/p mastectomy,  hypertension, hyperlipidemia, dm2, hypothyroidism, CAD s/p PCI to mid LAD, chronic diastolic CHF (EF 77-41%), presents with intermittent chest pressure since Saturday associated with sob.  Pt denies radiation of the pain.    In ED,  T 98.2, P 112 R 16 Bp 109/60  Pox 99% RA  CTA chest Musculoskeletal: Degenerative changes. Incompletely visualized superior endplate fracture at O87.  IMPRESSION: 1. Negative for acute pulmonary embolus. 2. Clear lung fields  Wbc 7.3, Hgb 16.8, Plt 274 Na 139, k 3.9, Bun 12, Creatinine 0.88 Trop I 4 TSH 7.938 Magnesium 2.2  covid -19 negative  Ekg-> afib at 115, LAD, RBBB  Pt treated with metoprolol in Ed.   Pt will be admitted for afib with RVR and chest pain      Review of systems:    In addition to the HPI above,  No Fever-chills, No Headache, No changes with Vision or hearing, No problems swallowing food or Liquids, No Cough No Abdominal pain, No Nausea or Vomiting, bowel movements are regular, No Blood in stool or Urine, No dysuria, No new skin rashes or bruises, No new joints pains-aches,  No new weakness, tingling, numbness in any extremity, No recent weight gain or loss, No polyuria, polydypsia or polyphagia, No significant Mental Stressors.  All other systems reviewed and are negative.    Past History of the following :    Past Medical History:  Diagnosis Date  . Anginal pain ([place])   . Anxiety   . Arthritis    "little in my  thumbs"back & knees   . Breast cancer ([place])    R breast, treated with mastectomy/tomoxifen in early 2000s  . Bronchitis    hx of  . CAD (coronary artery disease)    The patient had a left heart catheterization in August 2006 showed an EF of 70%, a 70% mid LAD lesion and 80% distal LAD ledsion, as well as 50% ostial first diagonal lesion.  The patient did have a drug-eluting stent placed in her mid LAD at that time.    . Depression   . Diastolic heart failure    Echo 11/10 with EF 55-60%, moderate diastolic dysfunction, no regional WMAs, mild to moderate TR, moderate LAE, mild AI  . Frequent urination at night   . GERD (gastroesophageal reflux disease)    "gone since gallbladder took out"  . Heart murmur    "small"  . Hypercholesterolemia   . Hypertension   . Hypothyroidism   . Mental disorder   . Sleep disturbance    sleep  study done- told that there wasn't any apnea concerns, states she is up & down for use of bathroom several times per night   . Type II diabetes mellitus ([place])    "controlled w/diet and exercise"  . Urinary incontinence       Past Surgical History:  Procedure Laterality Date  . [place] VITRECTOMY WITH 20 GAUGE MVR PORT Right 06/04/2013   Procedure: 25 GAUGE PARS PLANA VITRECTOMY WITH 20 GAUGE MVR PORT / REMOVAL SILICONE OIL RIGHT EYE. With Endolaser;  Surgeon: Hayden Pedro, MD;  Location: [facility];  Service: Ophthalmology;  Laterality: Right;  . ABDOMINAL HYSTERECTOMY  1964  . Adenosine Myoview     9/09: EF 69% with normal perfusion images suggesting no ischemia or infarction.    . APPENDECTOMY  1954  . BACK SURGERY  06/2015   fusion-lumbar  . BREAST BIOPSY  2001   right  . CARDIAC CATHETERIZATION    . CATARACT EXTRACTION W/ INTRAOCULAR LENS  IMPLANT, BILATERAL  ~ 2000  . CHOLECYSTECTOMY  ~ 2004  . COLONOSCOPY    . CORONARY ANGIOPLASTY    . CORONARY ANGIOPLASTY WITH STENT PLACEMENT  2000's   "1"  . DILATION AND CURETTAGE OF UTERUS  1960's    "I had 3"  . EYE SURGERY     /iol    following cataract removal  . GAS/FLUID EXCHANGE Right 06/04/2013   Procedure: GAS/FLUID EXCHANGE;  Surgeon: Hayden Pedro, MD;  Location: [employer] [facility];  Service: Ophthalmology;  Laterality: Right;  . MASTECTOMY  2001   right breast  . MEMBRANE PEEL Right 06/04/2013   Procedure: MEMBRANE PEEL;  Surgeon: Hayden Pedro, MD;  Location: [facility];  Service: Ophthalmology;  Laterality: Right;  . NEUROPLASTY / TRANSPOSITION MEDIAN NERVE AT CARPAL TUNNEL BILATERAL  1990's  . PARS PLANA VITRECTOMY  04/03/2012   right  . PARS PLANA VITRECTOMY  04/03/2012   Procedure: PARS PLANA VITRECTOMY WITH 25 GAUGE;  Surgeon: Hayden Pedro, MD;  Location: [facility];  Service: Ophthalmology;  Laterality: Right;  Silicone Oil, Perfluron, Repair Complex Traction Retinal Detachment  . PLANTAR FASCIA RELEASE  1990's   left  . TONSILLECTOMY AND ADENOIDECTOMY  1972  . TOTAL KNEE ARTHROPLASTY Left 10/13/2015   Procedure: TOTAL KNEE ARTHROPLASTY;  Surgeon: Melrose Nakayama, MD;  Location: [facility];  Service: Orthopedics;  Laterality: Left;  . TOTAL KNEE ARTHROPLASTY Right 09/20/2016   Procedure: TOTAL KNEE ARTHROPLASTY;  Surgeon: Melrose Nakayama, MD;  Location: [facility];  Service: Orthopedics;  Laterality: Right;  . ULNAR NERVE REPAIR     left arm      Social History:      Social History   Tobacco Use  . Smoking status: Never Smoker  . Smokeless tobacco: Never Used  Substance Use Topics  . Alcohol use: No       Family History :     Family History  Problem Relation Age of Onset  . Cancer Mother   . Alcoholism Father   . Cancer Brother        Home Medications:   Prior to Admission medications   Medication Sig Start Date End Date Taking? Authorizing Provider  apixaban (ELIQUIS) 5 MG TABS tablet Take 5 mg by mouth 2 (two) times daily.  10/17/18  Yes [provider]  aspirin EC 81 MG tablet Take 81 mg by mouth daily.   Yes [provider]  calcium carbonate (OS-CAL)  600 MG TABS tablet Take 600 mg by  mouth daily with breakfast.   Yes [provider]  furosemide (LASIX) 40 MG tablet Take 1 tablet (40 mg total) by mouth daily. 12/19/18 03/19/19 Yes Croitoru, Mihai, MD  hydrochlorothiazide (MICROZIDE) 12.5 MG capsule Take 12.5 mg by mouth daily.   Yes [provider]  isosorbide mononitrate (IMDUR) 120 MG 24 hr tablet Take 120 mg by mouth Daily.  04/19/11  Yes [provider]  levothyroxine (SYNTHROID) 75 MCG tablet Take 75 mcg by mouth daily. 01/25/19  Yes [provider]  losartan (COZAAR) 100 MG tablet Take 1 tablet (100 mg total) by mouth daily. 07/01/11  Yes Larey Dresser, MD  metoprolol tartrate (LOPRESSOR) 50 MG tablet Take 50 mg by mouth 2 (two) times a day. 11/17/18  Yes [provider]  traZODone (DESYREL) 150 MG tablet Take 225 mg by mouth at bedtime.    Yes [provider]  venlafaxine XR (EFFEXOR-XR) 150 MG 24 hr capsule Take 150 mg by mouth 2 (two) times a day.    Yes [provider]  methocarbamol (ROBAXIN) 500 MG tablet Take 1 tablet (500 mg total) by mouth every 6 (six) hours as needed for muscle spasms. Patient not taking: Reported on 03/12/2019 09/23/16   Loni Dolly, PA-C  traMADol (ULTRAM) 50 MG tablet Take 1 tablet (50 mg total) by mouth every 6 (six) hours as needed. Patient not taking: Reported on 03/12/2019 09/23/16   Loni Dolly, PA-C     Allergies:     Allergies  Allergen Reactions  . No Known Allergies      Physical Exam:   Vitals  Blood pressure (!) 179/149, pulse (!) 55, temperature 100.1 F (37.8 C), temperature source Rectal, resp. rate 17, SpO2 97 %.  1.  General: axoxo3  2. Psychiatric: euthymic  3. Neurologic: cn2-12 intact, reflexes 2+ symmetric, diffuse with no clonus, motor 5/5 in all 4 ext  4. HEENMT:  Anicteric, pupils 1.77mm symmetric, direct, consensual, near intact Neck: no jvd,   5. Respiratory : CTAB  6. Cardiovascular : rrr s1, s2,   7.  Gastrointestinal:  Abd: soft, nt, nd, +bs  8. Skin:  Ext: no c/c/e, no rash  9.Musculoskeletal:  Good ROM,  No adenopathy      Data Review:    CBC Recent Labs  Lab 03/12/19 1614  WBC 7.3  HGB 16.8*  HCT 50.1*  PLT 274  MCV 92.9  MCH 31.2  MCHC 33.5  RDW 13.3   ------------------------------------------------------------------------------------------------------------------  Results for orders placed or performed during the hospital encounter of 03/12/19 (from the past 48 hour(s))  Basic metabolic panel     Status: None   Collection Time: 03/12/19  4:14 PM  Result Value Ref Range   Sodium 139 135 - 145 mmol/L   Potassium 3.9 3.5 - 5.1 mmol/L   Chloride 101 98 - 111 mmol/L   CO2 24 22 - 32 mmol/L   Glucose, Bld 94 70 - 99 mg/dL   BUN 12 8 - 23 mg/dL   Creatinine, Ser 0.88 0.44 - 1.00 mg/dL   Calcium 10.2 8.9 - 10.3 mg/dL   GFR calc non Af Amer >60 >60 mL/min   GFR calc Af Amer >60 >60 mL/min   Anion gap 14 5 - 15    Comment: Performed at [facility], [place] [address], [place], [place] [address]  CBC     Status: Abnormal   Collection Time: 03/12/19  4:14 PM  Result Value Ref Range   WBC 7.3  4.0 - 10.5 K/uL   RBC 5.39 (H) 3.87 - 5.11 MIL/uL   Hemoglobin 16.8 (H) 12.0 - 15.0 g/dL   HCT 50.1 (H) 36.0 - 46.0 %   MCV 92.9 80.0 - 100.0 fL   MCH 31.2 26.0 - 34.0 pg   MCHC 33.5 30.0 - 36.0 g/dL   RDW 13.3 11.5 - 15.5 %   Platelets 274 150 - 400 K/uL   nRBC 0.0 0.0 - 0.2 %    Comment: Performed at [facility] [address], [place], [place] [address]  Troponin I (High Sensitivity)     Status: None   Collection Time: 03/12/19  4:14 PM  Result Value Ref Range   Troponin I (High Sensitivity) 4 <18 ng/L    Comment: (NOTE) Elevated high sensitivity troponin I (hsTnI) values and significant  changes across serial measurements may suggest ACS but many other  chronic and acute conditions are known to elevate hsTnI results.  Refer to the "Links" section for  chest pain algorithms and additional  guidance. Performed at [facility], [place] [address], [place], [place] [address]   Troponin I (High Sensitivity)     Status: None   Collection Time: 03/12/19  6:25 PM  Result Value Ref Range   Troponin I (High Sensitivity) 4 <18 ng/L    Comment: (NOTE) Elevated high sensitivity troponin I (hsTnI) values and significant  changes across serial measurements may suggest ACS but many other  chronic and acute conditions are known to elevate hsTnI results.  Refer to the "Links" section for chest pain algorithms and additional  guidance. Performed at [facility], [place] [address], [place], [place] [address]   SARS Coronavirus 2 (CEPHEID- Performed in [facility] hospital lab), Hosp Order     Status: None   Collection Time: 03/12/19  6:25 PM   Specimen: Nasopharyngeal Swab  Result Value Ref Range   SARS Coronavirus 2 NEGATIVE NEGATIVE    Comment: (NOTE) If result is NEGATIVE SARS-CoV-2 target nucleic acids are NOT DETECTED. The SARS-CoV-2 RNA is generally detectable in upper and lower  respiratory specimens during the acute phase of infection. The lowest  concentration of SARS-CoV-2 viral copies this assay can detect is 250  copies / mL. A negative result does not preclude SARS-CoV-2 infection  and should not be used as the sole basis for treatment or other  patient management decisions.  A negative result may occur with  improper specimen collection / handling, submission of specimen other  than nasopharyngeal swab, presence of viral mutation(s) within the  areas targeted by this assay, and inadequate number of viral copies  (<250 copies / mL). A negative result must be combined with clinical  observations, patient history, and epidemiological information. If result is POSITIVE SARS-CoV-2 target nucleic acids are DETECTED. The SARS-CoV-2 RNA is generally detectable in upper and lower  respiratory specimens dur ing the acute phase of  infection.  Positive  results are indicative of active infection with SARS-CoV-2.  Clinical  correlation with patient history and other diagnostic information is  necessary to determine patient infection status.  Positive results do  not rule out bacterial infection or co-infection with other viruses. If result is PRESUMPTIVE POSTIVE SARS-CoV-2 nucleic acids MAY BE PRESENT.   A presumptive positive result was obtained on the submitted specimen  and confirmed on repeat testing.  While 2019 novel coronavirus  (SARS-CoV-2) nucleic acids may be present in the submitted sample  additional confirmatory testing may be  necessary for epidemiological  and / or clinical management purposes  to differentiate between  SARS-CoV-2 and other Sarbecovirus currently known to infect humans.  If clinically indicated additional testing with an alternate test  methodology [phone]) is advised. The SARS-CoV-2 RNA is generally  detectable in upper and lower respiratory sp ecimens during the acute  phase of infection. The expected result is Negative. Fact Sheet for Patients:  StrictlyIdeas.no Fact Sheet for Healthcare Providers: BankingDealers.co.za This test is not yet approved or cleared by the [place] FDA and has been authorized for detection and/or diagnosis of SARS-CoV-2 by FDA under an Emergency Use Authorization (EUA).  This EUA will remain in effect (meaning this test can be used) for the duration of the COVID-19 declaration under Section 564(b)(1) of the Act, 21 U.S.C. section 360bbb-3(b)(1), unless the authorization is terminated or revoked sooner. Performed at [facility], [place] [address] [place], [place] [address]   Lactic acid, plasma     Status: None   Collection Time: 03/12/19  6:25 PM  Result Value Ref Range   Lactic Acid, Venous 1.6 0.5 - 1.9 mmol/L    Comment: Performed at [facility] [address], [place], [place]  [address]  Magnesium     Status: None   Collection Time: 03/12/19  6:25 PM  Result Value Ref Range   Magnesium 2.2 1.7 - 2.4 mg/dL    Comment: Performed at [facility], [place] [address], [place], [place] [address]  Urinalysis, Routine w reflex microscopic     Status: Abnormal   Collection Time: 03/12/19  6:43 PM  Result Value Ref Range   Color, Urine YELLOW YELLOW   APPearance CLEAR CLEAR   Specific Gravity, Urine 1.017 1.005 - 1.030   pH 5.0 5.0 - 8.0   Glucose, UA NEGATIVE NEGATIVE mg/dL   Hgb urine dipstick MODERATE (A) NEGATIVE   Bilirubin Urine NEGATIVE NEGATIVE   Ketones, ur NEGATIVE NEGATIVE mg/dL   Protein, ur 30 (A) NEGATIVE mg/dL   Nitrite NEGATIVE NEGATIVE   Leukocytes,Ua SMALL (A) NEGATIVE   RBC / HPF 0-5 0 - 5 RBC/hpf   WBC, UA 0-5 0 - 5 WBC/hpf   Bacteria, UA RARE (A) NONE SEEN   Squamous Epithelial / LPF 0-5 0 - 5   Mucus PRESENT    Hyaline Casts, UA PRESENT     Comment: Performed at [facility], [address], [place], [place] [address]  TSH     Status: Abnormal   Collection Time: 03/12/19 10:32 PM  Result Value Ref Range   TSH 7.938 (H) 0.350 - 4.500 uIU/mL    Comment: Performed by a 3rd Generation assay with a functional sensitivity of <=0.01 uIU/mL. Performed at [facility], [place] [address] [place],  [address]     Chemistries  Recent Labs  Lab 03/12/19 1614 03/12/19 1825  NA 139  --   K 3.9  --   CL 101  --   CO2 24  --   GLUCOSE 94  --   BUN 12  --   CREATININE 0.88  --   CALCIUM 10.2  --   MG  --  2.2   ------------------------------------------------------------------------------------------------------------------  ------------------------------------------------------------------------------------------------------------------ GFR: CrCl cannot be calculated (Unknown ideal weight.). Liver Function Tests: No results for input(s): AST, ALT, ALKPHOS, BILITOT, PROT, ALBUMIN in the last 168 hours. No results for  input(s): LIPASE, AMYLASE in the last 168 hours. No results for input(s): AMMONIA in the last  168 hours. Coagulation Profile: No results for input(s): INR, PROTIME in the last 168 hours. Cardiac Enzymes: No results for input(s): CKTOTAL, CKMB, CKMBINDEX, TROPONINI in the last 168 hours. BNP (last 3 results) No results for input(s): PROBNP in the last 8760 hours. HbA1C: No results for input(s): HGBA1C in the last 72 hours. CBG: No results for input(s): GLUCAP in the last 168 hours. Lipid Profile: No results for input(s): CHOL, HDL, LDLCALC, TRIG, CHOLHDL, LDLDIRECT in the last 72 hours. Thyroid Function Tests: Recent Labs    03/12/19 2232  TSH 7.938*   Anemia Panel: No results for input(s): VITAMINB12, FOLATE, FERRITIN, TIBC, IRON, RETICCTPCT in the last 72 hours.  --------------------------------------------------------------------------------------------------------------- Urine analysis:    Component Value Date/Time   COLORURINE YELLOW 03/12/2019 1843   APPEARANCEUR CLEAR 03/12/2019 1843   LABSPEC 1.017 03/12/2019 1843   PHURINE 5.0 03/12/2019 1843   GLUCOSEU NEGATIVE 03/12/2019 1843   HGBUR MODERATE (A) 03/12/2019 1843   BILIRUBINUR NEGATIVE 03/12/2019 1843   KETONESUR NEGATIVE 03/12/2019 1843   PROTEINUR 30 (A) 03/12/2019 1843   UROBILINOGEN 1.0 06/25/2015 0932   NITRITE NEGATIVE 03/12/2019 1843   LEUKOCYTESUR SMALL (A) 03/12/2019 1843      Imaging Results:    Ct Angio Chest Pe W And/or Wo Contrast  Result Date: 03/12/2019 CLINICAL DATA:  Chest heaviness and diaphoresis EXAM: CT ANGIOGRAPHY CHEST WITH CONTRAST TECHNIQUE: Multidetector CT imaging of the chest was performed using the standard protocol during bolus administration of intravenous contrast. Multiplanar CT image reconstructions and MIPs were obtained to evaluate the vascular anatomy. CONTRAST:  38mL OMNIPAQUE IOHEXOL 350 MG/ML SOLN COMPARISON:  03/12/2019 radiograph FINDINGS: Cardiovascular: Satisfactory  opacification of the pulmonary arteries to the segmental level. No evidence of pulmonary embolism. Nonaneurysmal aorta. Mild aortic atherosclerosis. Coronary vascular calcification. Mild cardiomegaly. No pericardial effusion. Mediastinum/Nodes: Midline trachea. No thyroid mass. No significant adenopathy. Small distal esophageal hiatal hernia Lungs/Pleura: Lungs are clear. No pleural effusion or pneumothorax. Upper Abdomen: No acute abnormality. Musculoskeletal: Degenerative changes. Incompletely visualized superior endplate fracture at Q25. Review of the MIP images confirms the above findings. IMPRESSION: 1. Negative for acute pulmonary embolus. 2. Clear lung fields Aortic Atherosclerosis (ICD10-I70.0). Electronically Signed   By: Donavan Foil M.D.   On: 03/12/2019 21:46   Dg Chest Portable 1 View  Result Date: 03/12/2019 CLINICAL DATA:  Chest discomfort EXAM: PORTABLE CHEST 1 VIEW COMPARISON:  September 13, 2016 FINDINGS: There is no edema or consolidation. Heart is mildly enlarged with pulmonary vascularity normal. Soft tissue prominence right paratracheal region is stable and felt to represent great vessel prominence. No adenopathy. No pneumothorax. No bone lesions. IMPRESSION: No edema or consolidation. Stable cardiac silhouette. No evident adenopathy. Electronically Signed   By: Lowella Grip III M.D.   On: 03/12/2019 18:25       Assessment & Plan:    Principal Problem:   Atrial fibrillation with RVR ([place]) Active Problems:   Hyperlipidemia   Essential hypertension  Afib with RVR Tele Trop I q2h x2 Check cardiac echo Unclear how she is taking metoprolol, in computer metoprolol tartrate  50mg  po bid, but pt stated that she was taking only once daily.  Cont Toprol XL 50mg  po qday as started by ED DC Eliquis,  Heparin gtt while awaiting cardiology input Cardiology consulted by ED, appreciate input  Fever, low grade unclear source, doubt seritonin syndrome Blood culture x2 pending Add  LFT  Hypertension/ CAD s/p PCI / DES mid LAD Cont Losartan 100mg  po qday Cont Hydrochlorothazide 12.5mg  po  qday Cont Metoprolol XL 50mg  po qday Cont Lasix 40mg  po qday Cont Aspirin 81mg  po qday  Hypothyroidism Increase Levothyroxine to 88 micrograms po qday   Anxiety Cont Effexor XR 150mg  po qday Cont Trazodone 225mg  po qhs    DVT Prophylaxis-   Lovenox - SCDs   AM Labs Ordered, also please review Full Orders  Family Communication: Admission, patients condition and plan of care including tests being ordered have been discussed with the patient  who indicate understanding and agree with the plan and Code Status.  Code Status:  FULL CODE  Admission status: Observation : Based on patients clinical presentation and evaluation of above clinical data, I have made determination that patient meets observation criteria at this time.    Time spent in minutes : 70   Jani Gravel M.D on 03/13/2019 at 12:53 AM

## 2019-03-12 NOTE — Telephone Encounter (Signed)
New Message   Pt c/o Shortness Of Breath: STAT if SOB developed within the last 24 hours or pt is noticeably SOB on the phone  1. Are you currently SOB (can you hear that pt is SOB on the phone)? Yes  2. How long have you been experiencing SOB? A week  3. Are you SOB when sitting or when up moving around? Constantly, both when sitting and moving around   4. Are you currently experiencing any other symptoms? Chest Tightness

## 2019-03-13 ENCOUNTER — Observation Stay (HOSPITAL_COMMUNITY): Payer: Medicare Other

## 2019-03-13 ENCOUNTER — Encounter (HOSPITAL_COMMUNITY): Payer: Self-pay | Admitting: Internal Medicine

## 2019-03-13 DIAGNOSIS — I110 Hypertensive heart disease with heart failure: Secondary | ICD-10-CM | POA: Diagnosis present

## 2019-03-13 DIAGNOSIS — I4510 Unspecified right bundle-branch block: Secondary | ICD-10-CM | POA: Diagnosis present

## 2019-03-13 DIAGNOSIS — E785 Hyperlipidemia, unspecified: Secondary | ICD-10-CM | POA: Diagnosis present

## 2019-03-13 DIAGNOSIS — I25118 Atherosclerotic heart disease of native coronary artery with other forms of angina pectoris: Secondary | ICD-10-CM | POA: Diagnosis not present

## 2019-03-13 DIAGNOSIS — I4819 Other persistent atrial fibrillation: Secondary | ICD-10-CM | POA: Diagnosis present

## 2019-03-13 DIAGNOSIS — Z7901 Long term (current) use of anticoagulants: Secondary | ICD-10-CM | POA: Diagnosis not present

## 2019-03-13 DIAGNOSIS — R0789 Other chest pain: Secondary | ICD-10-CM

## 2019-03-13 DIAGNOSIS — Z20828 Contact with and (suspected) exposure to other viral communicable diseases: Secondary | ICD-10-CM | POA: Diagnosis present

## 2019-03-13 DIAGNOSIS — Z7982 Long term (current) use of aspirin: Secondary | ICD-10-CM | POA: Diagnosis not present

## 2019-03-13 DIAGNOSIS — I2510 Atherosclerotic heart disease of native coronary artery without angina pectoris: Secondary | ICD-10-CM | POA: Diagnosis not present

## 2019-03-13 DIAGNOSIS — Z9861 Coronary angioplasty status: Secondary | ICD-10-CM | POA: Insufficient documentation

## 2019-03-13 DIAGNOSIS — I4891 Unspecified atrial fibrillation: Secondary | ICD-10-CM

## 2019-03-13 DIAGNOSIS — E039 Hypothyroidism, unspecified: Secondary | ICD-10-CM | POA: Diagnosis present

## 2019-03-13 DIAGNOSIS — I10 Essential (primary) hypertension: Secondary | ICD-10-CM | POA: Diagnosis not present

## 2019-03-13 DIAGNOSIS — Z96653 Presence of artificial knee joint, bilateral: Secondary | ICD-10-CM | POA: Diagnosis present

## 2019-03-13 DIAGNOSIS — I248 Other forms of acute ischemic heart disease: Secondary | ICD-10-CM | POA: Diagnosis present

## 2019-03-13 DIAGNOSIS — R509 Fever, unspecified: Secondary | ICD-10-CM | POA: Diagnosis not present

## 2019-03-13 DIAGNOSIS — I5032 Chronic diastolic (congestive) heart failure: Secondary | ICD-10-CM | POA: Diagnosis not present

## 2019-03-13 DIAGNOSIS — I5033 Acute on chronic diastolic (congestive) heart failure: Secondary | ICD-10-CM | POA: Diagnosis present

## 2019-03-13 DIAGNOSIS — I25110 Atherosclerotic heart disease of native coronary artery with unstable angina pectoris: Secondary | ICD-10-CM | POA: Diagnosis present

## 2019-03-13 DIAGNOSIS — E876 Hypokalemia: Secondary | ICD-10-CM | POA: Diagnosis not present

## 2019-03-13 DIAGNOSIS — E669 Obesity, unspecified: Secondary | ICD-10-CM | POA: Diagnosis present

## 2019-03-13 DIAGNOSIS — E7800 Pure hypercholesterolemia, unspecified: Secondary | ICD-10-CM | POA: Diagnosis not present

## 2019-03-13 DIAGNOSIS — Z6832 Body mass index (BMI) 32.0-32.9, adult: Secondary | ICD-10-CM | POA: Diagnosis not present

## 2019-03-13 DIAGNOSIS — E119 Type 2 diabetes mellitus without complications: Secondary | ICD-10-CM | POA: Diagnosis present

## 2019-03-13 DIAGNOSIS — Y831 Surgical operation with implant of artificial internal device as the cause of abnormal reaction of the patient, or of later complication, without mention of misadventure at the time of the procedure: Secondary | ICD-10-CM | POA: Diagnosis present

## 2019-03-13 DIAGNOSIS — T82855A Stenosis of coronary artery stent, initial encounter: Secondary | ICD-10-CM | POA: Diagnosis present

## 2019-03-13 DIAGNOSIS — Z853 Personal history of malignant neoplasm of breast: Secondary | ICD-10-CM | POA: Diagnosis not present

## 2019-03-13 HISTORY — DX: Other chest pain: R07.89

## 2019-03-13 LAB — CBC
HCT: 47.8 % — ABNORMAL HIGH (ref 36.0–46.0)
Hemoglobin: 16.3 g/dL — ABNORMAL HIGH (ref 12.0–15.0)
MCH: 31 pg (ref 26.0–34.0)
MCHC: 34.1 g/dL (ref 30.0–36.0)
MCV: 90.9 fL (ref 80.0–100.0)
Platelets: 258 10*3/uL (ref 150–400)
RBC: 5.26 MIL/uL — ABNORMAL HIGH (ref 3.87–5.11)
RDW: 13.3 % (ref 11.5–15.5)
WBC: 8.2 10*3/uL (ref 4.0–10.5)
nRBC: 0 % (ref 0.0–0.2)

## 2019-03-13 LAB — GLUCOSE, CAPILLARY
Glucose-Capillary: 101 mg/dL — ABNORMAL HIGH (ref 70–99)
Glucose-Capillary: 123 mg/dL — ABNORMAL HIGH (ref 70–99)

## 2019-03-13 LAB — COMPREHENSIVE METABOLIC PANEL
ALT: 21 U/L (ref 0–44)
AST: 21 U/L (ref 15–41)
Albumin: 4 g/dL (ref 3.5–5.0)
Alkaline Phosphatase: 69 U/L (ref 38–126)
Anion gap: 10 (ref 5–15)
BUN: 9 mg/dL (ref 8–23)
CO2: 23 mmol/L (ref 22–32)
Calcium: 9.5 mg/dL (ref 8.9–10.3)
Chloride: 104 mmol/L (ref 98–111)
Creatinine, Ser: 0.9 mg/dL (ref 0.44–1.00)
GFR calc Af Amer: >60 mL/min (ref >60)
GFR calc non Af Amer: 59 mL/min — ABNORMAL LOW (ref >60)
Glucose, Bld: 117 mg/dL — ABNORMAL HIGH (ref 70–99)
Potassium: 3.2 mmol/L — ABNORMAL LOW (ref 3.5–5.1)
Sodium: 137 mmol/L (ref 135–145)
Total Bilirubin: 0.9 mg/dL (ref 0.3–1.2)
Total Protein: 7.2 g/dL (ref 6.5–8.1)

## 2019-03-13 LAB — HEPARIN LEVEL (UNFRACTIONATED): Heparin Unfractionated: 1.06 IU/mL — ABNORMAL HIGH (ref 0.30–0.70)

## 2019-03-13 LAB — BRAIN NATRIURETIC PEPTIDE: B Natriuretic Peptide: 635.2 pg/mL — ABNORMAL HIGH (ref 0.0–100.0)

## 2019-03-13 LAB — URINE CULTURE

## 2019-03-13 LAB — APTT
aPTT: 38 seconds — ABNORMAL HIGH (ref 24–36)
aPTT: 93 seconds — ABNORMAL HIGH (ref 24–36)

## 2019-03-13 LAB — CBG MONITORING, ED
Glucose-Capillary: 150 mg/dL — ABNORMAL HIGH (ref 70–99)
Glucose-Capillary: 94 mg/dL (ref 70–99)
Glucose-Capillary: 97 mg/dL (ref 70–99)

## 2019-03-13 MED ORDER — ACETAMINOPHEN 650 MG RE SUPP: 650.0000 mg | Freq: Four times a day (QID) | RECTAL | Status: DC | PRN

## 2019-03-13 MED ORDER — SODIUM CHLORIDE 0.9% FLUSH
3.0000 mL | Freq: Two times a day (BID) | INTRAVENOUS | Status: DC
  Administered 2019-03-13 – 2019-03-15 (×4): 3 mL via INTRAVENOUS

## 2019-03-13 MED ORDER — TRAZODONE HCL 150 MG PO TABS
225.0000 mg | ORAL_TABLET | Freq: Every day | ORAL | Status: DC
  Administered 2019-03-13 – 2019-03-15 (×3): 225 mg via ORAL
  Filled 2019-03-13 (×3): qty 2

## 2019-03-13 MED ORDER — HEPARIN (PORCINE) 25000 UT/250ML-% IV SOLN
1050.0000 [IU]/h | INTRAVENOUS | Status: DC
  Administered 2019-03-13: 1000 [IU]/h via INTRAVENOUS
  Administered 2019-03-14: 1200 [IU]/h via INTRAVENOUS
  Filled 2019-03-13: qty 250

## 2019-03-13 MED ORDER — INSULIN ASPART 100 UNIT/ML ~~LOC~~ SOLN
0.0000 [IU] | SUBCUTANEOUS | Status: DC
  Administered 2019-03-13: 1 [IU] via SUBCUTANEOUS

## 2019-03-13 MED ORDER — FUROSEMIDE 10 MG/ML IJ SOLN
20.0000 mg | Freq: Every day | INTRAMUSCULAR | Status: AC
  Administered 2019-03-14: 20 mg via INTRAVENOUS
  Filled 2019-03-13: qty 4
  Filled 2019-03-13: qty 2

## 2019-03-13 MED ORDER — POTASSIUM CHLORIDE CRYS ER 20 MEQ PO TBCR
40.0000 meq | EXTENDED_RELEASE_TABLET | Freq: Every day | ORAL | Status: DC
  Administered 2019-03-13 – 2019-03-16 (×4): 40 meq via ORAL
  Filled 2019-03-13 (×4): qty 2

## 2019-03-13 MED ORDER — HYDROCHLOROTHIAZIDE 12.5 MG PO CAPS
12.5000 mg | ORAL_CAPSULE | Freq: Every day | ORAL | Status: DC
  Administered 2019-03-13: 12.5 mg via ORAL
  Filled 2019-03-13: qty 1

## 2019-03-13 MED ORDER — DILTIAZEM LOAD VIA INFUSION
10.0000 mg | Freq: Once | INTRAVENOUS | Status: AC
  Administered 2019-03-13: 10 mg via INTRAVENOUS
  Filled 2019-03-13: qty 10

## 2019-03-13 MED ORDER — ACETAMINOPHEN 325 MG PO TABS: 650.0000 mg | ORAL_TABLET | Freq: Four times a day (QID) | ORAL | Status: DC | PRN

## 2019-03-13 MED ORDER — LOSARTAN POTASSIUM 50 MG PO TABS
100.0000 mg | ORAL_TABLET | Freq: Every day | ORAL | Status: DC
  Administered 2019-03-13: 100 mg via ORAL
  Filled 2019-03-13: qty 2

## 2019-03-13 MED ORDER — LEVOTHYROXINE SODIUM 100 MCG PO TABS
100.0000 ug | ORAL_TABLET | Freq: Every day | ORAL | Status: DC
  Administered 2019-03-15 – 2019-03-16 (×2): 100 ug via ORAL
  Filled 2019-03-13 (×2): qty 1

## 2019-03-13 MED ORDER — SODIUM CHLORIDE 0.9% FLUSH
3.0000 mL | Freq: Two times a day (BID) | INTRAVENOUS | Status: DC
  Administered 2019-03-13 – 2019-03-16 (×5): 3 mL via INTRAVENOUS

## 2019-03-13 MED ORDER — POTASSIUM CHLORIDE CRYS ER 20 MEQ PO TBCR
40.0000 meq | EXTENDED_RELEASE_TABLET | Freq: Once | ORAL | Status: AC
  Administered 2019-03-13: 40 meq via ORAL
  Filled 2019-03-13: qty 2

## 2019-03-13 MED ORDER — VENLAFAXINE HCL ER 75 MG PO CP24
150.0000 mg | ORAL_CAPSULE | Freq: Two times a day (BID) | ORAL | Status: DC
  Administered 2019-03-13 – 2019-03-16 (×7): 150 mg via ORAL
  Filled 2019-03-13 (×3): qty 2
  Filled 2019-03-13: qty 1
  Filled 2019-03-13: qty 2
  Filled 2019-03-13: qty 1
  Filled 2019-03-13 (×2): qty 2

## 2019-03-13 MED ORDER — SODIUM CHLORIDE 0.9% FLUSH: 3.0000 mL | INTRAVENOUS | Status: DC | PRN

## 2019-03-13 MED ORDER — DILTIAZEM HCL-DEXTROSE 100-5 MG/100ML-% IV SOLN (PREMIX)
5.0000 mg/h | INTRAVENOUS | Status: DC
  Administered 2019-03-14: 5 mg/h via INTRAVENOUS
  Filled 2019-03-13 (×2): qty 100

## 2019-03-13 MED ORDER — SODIUM CHLORIDE 0.9 % IV SOLN: 250.0000 mL | INTRAVENOUS | Status: DC | PRN

## 2019-03-13 MED ORDER — INSULIN ASPART 100 UNIT/ML ~~LOC~~ SOLN
0.0000 [IU] | SUBCUTANEOUS | Status: DC
  Administered 2019-03-13 – 2019-03-15 (×2): 1 [IU] via SUBCUTANEOUS

## 2019-03-13 MED ORDER — APIXABAN 5 MG PO TABS: 5.0000 mg | ORAL_TABLET | Freq: Two times a day (BID) | ORAL | Status: DC

## 2019-03-13 MED ORDER — ISOSORBIDE MONONITRATE ER 60 MG PO TB24
120.0000 mg | ORAL_TABLET | Freq: Every day | ORAL | Status: DC
  Administered 2019-03-13 – 2019-03-16 (×4): 120 mg via ORAL
  Filled 2019-03-13 (×3): qty 2
  Filled 2019-03-13: qty 4

## 2019-03-13 MED ORDER — LEVOTHYROXINE SODIUM 88 MCG PO TABS
88.0000 ug | ORAL_TABLET | Freq: Every day | ORAL | Status: DC
  Administered 2019-03-13: 88 ug via ORAL
  Filled 2019-03-13: qty 1

## 2019-03-13 MED ORDER — ASPIRIN EC 81 MG PO TBEC
81.0000 mg | DELAYED_RELEASE_TABLET | Freq: Every day | ORAL | Status: DC
  Administered 2019-03-13 – 2019-03-16 (×3): 81 mg via ORAL
  Filled 2019-03-13 (×5): qty 1

## 2019-03-13 NOTE — Progress Notes (Addendum)
ANTICOAGULATION CONSULT NOTE - Follow Up Consult  Pharmacy Consult for Heparin Indication: atrial fibrillation  Allergies  Allergen Reactions  . No Known Allergies     Patient Measurements: Height: 5\' 3"  (160 cm) Weight: 179 lb 14.3 oz (81.6 kg) IBW/kg (Calculated) : 52.4 Heparin Dosing Weight: 70.3 kg  Vital Signs: BP: 159/104 (07/29 0800) Pulse Rate: 87 (07/29 0800)  Labs: Recent Labs    03/12/19 1614 03/12/19 1825 03/13/19 0237 03/13/19 1018  HGB 16.8*  --  16.3*  --   HCT 50.1*  --  47.8*  --   PLT 274  --  258  --   APTT  --   --   --  38*  HEPARINUNFRC  --   --   --  1.06*  CREATININE 0.88  --  0.90  --   TROPONINIHS 4 4  --   --     Estimated Creatinine Clearance: 47.9 mL/min (by C-G formula based on SCr of 0.9 mg/dL).   Medications:  Scheduled:  . aspirin EC  81 mg Oral Daily  . diltiazem  10 mg Intravenous Once  . furosemide  20 mg Intravenous Daily  . insulin aspart  0-9 Units Subcutaneous Q4H  . isosorbide mononitrate  120 mg Oral Daily  . [START ON 03/14/2019] levothyroxine  100 mcg Oral Q breakfast  . metoprolol succinate  50 mg Oral Daily  . potassium chloride  40 mEq Oral Once  . potassium chloride  40 mEq Oral Daily  . sodium chloride flush  3 mL Intravenous Q12H  . traZODone  225 mg Oral QHS  . venlafaxine XR  150 mg Oral BID   Infusions:  . sodium chloride    . diltiazem (CARDIZEM) infusion    . heparin 1,000 Units/hr (03/13/19 0211)    Assessment: 89 yoF admitted with SOB on apixaban PTA for Afib. Pharmacy consulted to dose heparin while awaiting cardiology evaulation. CBC and Scr stable. No bleeding or issues with heparin infusion per RN.   HL 1.06 - remains elevated as expected APTT 38 secs - subtherapeutic - will continue to follow until Eliquis is no longer influencing heparin level  Goal of Therapy:  Heparin level 0.3-0.7 units/ml  APTT level 66-102 secs Monitor platelets by anticoagulation protocol: Yes   Plan:  Increase  Heparin to 1200 units/hr  Check 8hr aPTT Daily HL, aPTT and CBC Monitor for s/sx of bleeding  Lorel Monaco, PharmD PGY1 Ambulatory Care Resident [employer] # [phone]

## 2019-03-13 NOTE — Progress Notes (Addendum)
ANTICOAGULATION CONSULT NOTE - Initial Consult  Pharmacy Consult for Eliquis Indication: atrial fibrillation  Allergies  Allergen Reactions  . No Known Allergies     Patient Measurements:    Vital Signs: Temp: 100.1 F (37.8 C) (07/28 1759) Temp Source: Rectal (07/28 1759) BP: 179/149 (07/28 2331) Pulse Rate: 55 (07/28 2300)  Labs: Recent Labs    03/12/19 1614 03/12/19 1825  HGB 16.8*  --   HCT 50.1*  --   PLT 274  --   CREATININE 0.88  --   TROPONINIHS 4 4    CrCl cannot be calculated (Unknown ideal weight.).   Medical History: Past Medical History:  Diagnosis Date  . Anginal pain ([place])   . Anxiety   . Arthritis    "little in my thumbs"back & knees   . Breast cancer ([place])    R breast, treated with mastectomy/tomoxifen in early 2000s  . Bronchitis    hx of  . CAD (coronary artery disease)    The patient had a left heart catheterization in August 2006 showed an EF of 70%, a 70% mid LAD lesion and 80% distal LAD ledsion, as well as 50% ostial first diagonal lesion.  The patient did have a drug-eluting stent placed in her mid LAD at that time.    . Depression   . Diastolic heart failure    Echo 11/10 with EF 55-60%, moderate diastolic dysfunction, no regional WMAs, mild to moderate TR, moderate LAE, mild AI  . Frequent urination at night   . GERD (gastroesophageal reflux disease)    "gone since gallbladder took out"  . Heart murmur    "small"  . Hypercholesterolemia   . Hypertension   . Hypothyroidism   . Mental disorder   . Sleep disturbance    sleep study done- told that there wasn't any apnea concerns, states she is up & down for use of bathroom several times per night   . Type II diabetes mellitus ([place])    "controlled w/diet and exercise"  . Urinary incontinence      Assessment: 73 yoF admitted with SOB on apixaban. Pharmacy asked to resume, last dose 7/27 pm. Age >80 yr but SCr <1 and recent wt ~80kg.  Goal of Therapy:  Monitor platelets by  anticoagulation protocol: Yes   Plan:  -Resume apixaban 5mg  BID as per home dose -Pharmacy will sign off, reconsult if needed   ADDENDUM: Pt now with AFib RVR and CP, pharmacy to start heparin while awaiting cardiology evaluation.  Plan: -Hold apixaban -Heparin 1000units/hr with no bolus -Check 8hr aPTT and heparin level    Arrie Senate, PharmD, BCPS Clinical Pharmacist Please check AMION for all [place] numbers 03/13/2019

## 2019-03-13 NOTE — Consult Note (Addendum)
Cardiology Consultation:   Patient ID: [name] [name] MRN: [id number]; DOB: 13-Feb-1934  Admit date: 03/12/2019 Date of Consult: 03/13/2019  Primary Care Provider: [facility] Primary Cardiologist: [name], MD  Primary Electrophysiologist:  None    Patient Profile:   [name] [name] is a 85 y.o. female with a hx of CAD (DES to LAD in 2006), obesity, type 2 diabetes mellitus, mild sleep apnea, Chronic diastolic HF (EF 39-76%, B3AL 2020), recent diagnosis of paroxysmal atrial fibrillation on Eliquis who is being seen today for the evaluation of dyspnea on exertion and chest pressure at the request of Dr. Jacinta Shoe.  History of Present Illness:   Past history includes coronary artery disease with placement of a drug-eluting stent to the LAD artery in 2006. At that time she was being seen by Dr. Loralie Champagne. Repeat cardiac catheterization for chest pain in 2010 showed a patent proximal LAD stent and a 50% mid LAD stenosis similar to prior cath in 2006. Echo in 2014 showed LVEF 60-65% with G2DD, mild AR, mildly dilated left atrium, PA peak pressure 48mmhg. Patient followed with Dr. Aundra Dubin until December 2016 and then did not have any cardiology follow-up for a few years.  10/17/18 patient was seen at [facility] cardiology for an irregular heart rhythm and was evaluated by Dr. Dwyane Dee.  At that visit a heart rate of 127 bpm was documented.  Her blood pressure was normal at 132/80 and she weighed 183 pounds. Electrocardiogram was performed which showed atrial fibrillation with rapid ventricular response, but this is not immediately available for review.  Anticoagulation with Eliquis was started;  was also continued on aspirin. Metoprolol 50mg  BID was started as well. TSH at that visit was 3.850.   The last televisit with Dr. Sallyanne Kuster was 12/19/18 new patient visit for exertional dyspnea and atrial fibrillation.  Patient was positive for orthopnea but denied leg edema. Echo 11/11/18 @ [place] showed  LVEF 60 to 65%, normal right ventricular systolic function, mild left ventricular hypertrophy, G2DD (elevated filling pressures), mitral annular calcification, mildly dilated left atrium, aortic sclerosis. Negative for chest pain although had been on Imdur. Lexiscan Myoview was considered at that time. Patient was started on Lasix 40mg  daily. ASA and HCTZ were stopped. Possible plan was for CDDV after 4 weeks of uninterrupted a/c with likely addition of antiarrhythmic since patient might not likely stay in sinus due to age and dilated left atrium.  A Long term Monitor was placed which showed persistent atrial fibrillation with fair ventricular rate control, but with frequent rapid ventricular response during activity. Bradycardia is not seen. No significant ventricular arrhythmia.  [name] [name] presented to the ED 7/218 for dyspnea on exertion and constant chest pressure x 3 days. Pressure is central and non-radiating.The pressure has been constant with no alleviating factors. Not worse on exertion. She has never felt this before. She has associated SOB. Positive for orthopnea and paroxysmal nocturnal dyspnea. Denies diaphoresis, N/V, syncope, cough, or other recent illnesses. Has not missed any doses of Elliquis.  In the ED patient was noted to be in atrial fibrillation RVR 112 bpm. EKG also showed RBB, and LAD. T 98.2, B/P 109/60, 99% RA, RR16.  Labs showed potassium 3.2, Magnesium 2.2, Creatinine 0.9. BNP 635, HS troponin 4>4. WBC 8.2, HGB 16.3. TSH 7.938. COVID negative. CT angiogram was negative for PE. Patient was given Toprol 50mg  with no improvement in heart rate. Cardizem drip was started. Patient was switched from Eliquis to heparin and admitted for further  work-up.   Heart Pathway Score:     Past Medical History:  Diagnosis Date   Anginal pain ([place])    Anxiety    Arthritis    "little in my thumbs"back & knees    Breast cancer ([place])    R breast, treated with mastectomy/tomoxifen in early 2000s    Bronchitis    hx of   CAD (coronary artery disease)    The patient had a left heart catheterization in August 2006 showed an EF of 70%, a 70% mid LAD lesion and 80% distal LAD ledsion, as well as 50% ostial first diagonal lesion.  The patient did have a drug-eluting stent placed in her mid LAD at that time.     Depression    Diastolic heart failure    Echo 11/10 with EF 55-60%, moderate diastolic dysfunction, no regional WMAs, mild to moderate TR, moderate LAE, mild AI   Frequent urination at night    GERD (gastroesophageal reflux disease)    "gone since gallbladder took out"   Heart murmur    "small"   Hypercholesterolemia    Hypertension    Hypothyroidism    Mental disorder    Sleep disturbance    sleep study done- told that there wasn't any apnea concerns, states she is up & down for use of bathroom several times per night    Type II diabetes mellitus ([place])    "controlled w/diet and exercise"   Urinary incontinence     Past Surgical History:  Procedure Laterality Date   25 GAUGE PARS PLANA VITRECTOMY WITH 20 GAUGE MVR PORT Right 06/04/2013   Procedure: 25 GAUGE PARS PLANA VITRECTOMY WITH 20 GAUGE MVR PORT / REMOVAL SILICONE OIL RIGHT EYE. With Endolaser;  Surgeon: Hayden Pedro, MD;  Location: [place];  Service: Ophthalmology;  Laterality: Right;   ABDOMINAL HYSTERECTOMY  1964   Adenosine Myoview     9/09: EF 69% with normal perfusion images suggesting no ischemia or infarction.     [place] SURGERY  06/2015   fusion-lumbar   BREAST BIOPSY  2001   right   CARDIAC CATHETERIZATION     CATARACT EXTRACTION W/ INTRAOCULAR LENS  IMPLANT, BILATERAL  ~ 2000   CHOLECYSTECTOMY  ~ 2004   COLONOSCOPY     CORONARY ANGIOPLASTY     CORONARY ANGIOPLASTY WITH STENT PLACEMENT  2000's   "1"   DILATION AND CURETTAGE OF UTERUS  1960's   "I had 3"   EYE SURGERY     /iol    following cataract removal   GAS/FLUID EXCHANGE Right 06/04/2013    Procedure: GAS/FLUID EXCHANGE;  Surgeon: Hayden Pedro, MD;  Location: [place];  Service: Ophthalmology;  Laterality: Right;   MASTECTOMY  2001   right breast   MEMBRANE PEEL Right 06/04/2013   Procedure: MEMBRANE PEEL;  Surgeon: Hayden Pedro, MD;  Location: [place];  Service: Ophthalmology;  Laterality: Right;   NEUROPLASTY / TRANSPOSITION MEDIAN NERVE AT CARPAL TUNNEL BILATERAL  1990's   PARS PLANA VITRECTOMY  04/03/2012   right   PARS PLANA VITRECTOMY  04/03/2012   Procedure: PARS PLANA VITRECTOMY WITH 25 GAUGE;  Surgeon: Hayden Pedro, MD;  Location: [place];  Service: Ophthalmology;  Laterality: Right;  Silicone Oil, Perfluron, Repair Complex Traction Retinal Detachment   PLANTAR FASCIA RELEASE  1990's   left   TONSILLECTOMY AND ADENOIDECTOMY  1972   TOTAL KNEE ARTHROPLASTY Left 10/13/2015   Procedure: TOTAL KNEE ARTHROPLASTY;  Surgeon: Melrose Nakayama, MD;  Location: [place];  Service: Orthopedics;  Laterality: Left;   TOTAL KNEE ARTHROPLASTY Right 09/20/2016   Procedure: TOTAL KNEE ARTHROPLASTY;  Surgeon: Melrose Nakayama, MD;  Location: [place];  Service: Orthopedics;  Laterality: Right;   ULNAR NERVE REPAIR     left arm     Home Medications:  Prior to Admission medications   Medication Sig Start Date End Date Taking? Authorizing Provider  apixaban (ELIQUIS) 5 MG TABS tablet Take 5 mg by mouth 2 (two) times daily.  10/17/18  Yes [provider]  aspirin EC 81 MG tablet Take 81 mg by mouth daily.   Yes [provider]  calcium carbonate (OS-CAL) 600 MG TABS tablet Take 600 mg by mouth daily with breakfast.   Yes [provider]  furosemide (LASIX) 40 MG tablet Take 1 tablet (40 mg total) by mouth daily. 12/19/18 03/19/19 Yes Croitoru, Mihai, MD  hydrochlorothiazide (MICROZIDE) 12.5 MG capsule Take 12.5 mg by mouth daily.   Yes [provider]  isosorbide mononitrate (IMDUR) 120 MG 24 hr tablet Take 120 mg by mouth Daily.  04/19/11  Yes [provider]  levothyroxine (SYNTHROID) 75 MCG tablet Take 75 mcg by mouth daily. 01/25/19  Yes [provider]  losartan (COZAAR) 100 MG tablet Take 1 tablet (100 mg total) by mouth daily. 07/01/11  Yes Larey Dresser, MD  metoprolol tartrate (LOPRESSOR) 50 MG tablet Take 50 mg by mouth 2 (two) times a day. 11/17/18  Yes [provider]  traZODone (DESYREL) 150 MG tablet Take 225 mg by mouth at bedtime.    Yes [provider]  venlafaxine XR (EFFEXOR-XR) 150 MG 24 hr capsule Take 150 mg by mouth 2 (two) times a day.    Yes [provider]  methocarbamol (ROBAXIN) 500 MG tablet Take 1 tablet (500 mg total) by mouth every 6 (six) hours as needed for muscle spasms. Patient not taking: Reported on 03/12/2019 09/23/16   Loni Dolly, PA-C  traMADol (ULTRAM) 50 MG tablet Take 1 tablet (50 mg total) by mouth every 6 (six) hours as needed. Patient not taking: Reported on 03/12/2019 09/23/16   Loni Dolly, PA-C    Inpatient Medications: Scheduled Meds:  aspirin EC  81 mg Oral Daily   diltiazem  10 mg Intravenous Once   furosemide  20 mg Intravenous Daily   insulin aspart  0-9 Units Subcutaneous Q4H   isosorbide mononitrate  120 mg Oral Daily   [START ON 03/14/2019] levothyroxine  100 mcg Oral Q breakfast   metoprolol succinate  50 mg Oral Daily   potassium chloride  40 mEq Oral Once   potassium chloride  40 mEq Oral Daily   sodium chloride flush  3 mL Intravenous Q12H   traZODone  225 mg Oral QHS   venlafaxine XR  150 mg Oral BID   Continuous Infusions:  sodium chloride     diltiazem (CARDIZEM) infusion     heparin 1,000 Units/hr (03/13/19 0211)   PRN Meds: sodium chloride, acetaminophen **OR** acetaminophen, sodium chloride flush  Allergies:    Allergies  Allergen Reactions   No Known Allergies     Social History:   Social History   Socioeconomic History   Marital status: Married    Spouse name: Not on file   Number of children:  Not on file   Years of education: Not on file   Highest education level: Not on file  Occupational History   Occupation: Daycare provider  Employer: RETIRED    Comment: part-time  Scientist, product/process development strain: Not on file   Food insecurity    Worry: Not on file    Inability: Not on file   Transportation needs    Medical: Not on file    Non-medical: Not on file  Tobacco Use   Smoking status: Never Smoker   Smokeless tobacco: Never Used  Substance and Sexual Activity   Alcohol use: No   Drug use: No   Sexual activity: Never  Lifestyle   Physical activity    Days per week: Not on file    Minutes per session: Not on file   Stress: Not on file  Relationships   Social connections    Talks on phone: Not on file    Gets together: Not on file    Attends religious service: Not on file    Active member of club or organization: Not on file    Attends meetings of clubs or organizations: Not on file    Relationship status: Not on file   Intimate partner violence    Fear of current or ex partner: Not on file    Emotionally abused: Not on file    Physically abused: Not on file    Forced sexual activity: Not on file  Other Topics Concern   Not on file  Social History Narrative   Not on file    Family History:    Family History  Problem Relation Age of Onset   Cancer Mother    Alcoholism Father    Cancer Brother      ROS:  Please see the history of present illness.  All other ROS reviewed and negative.     Physical Exam/Data:   Vitals:   03/13/19 0200 03/13/19 0330 03/13/19 0500 03/13/19 0800  BP: (!) 144/113 (!) 161/95 (!) 143/98 (!) 159/104  Pulse: 90 97 93 87  Resp:    14  Temp:      TempSrc:      SpO2: 97% 96% 97% 97%  Weight:      Height:        Intake/Output Summary (Last 24 hours) at 03/13/2019 1128 Last data filed at 03/12/2019 1930 Gross per 24 hour  Intake 1000 ml  Output --  Net 1000 ml   Last 3 Weights 03/13/2019  12/20/2018 09/13/2016  Weight (lbs) 180 lb 180 lb 176 lb 14.4 oz  Weight (kg) 81.647 kg 81.647 kg 80.241 kg     Body mass index is 31.89 kg/m.  General:  Well nourished, well developed WF, in no acute distress HEENT: normal Neck: no JVD Endocrine:  No thryomegaly Vascular: No carotid bruits; FA pulses 2+ bilaterally without bruits  Cardiac:  normal S1, S2;Irregularly Irregular rhythym; no murmur  Lungs:  clear to auscultation bilaterally, no wheezing, rhonchi or rales  Abd: soft, nontender, no hepatomegaly  Ext: no edema Musculoskeletal:  No deformities, BUE and BLE strength normal and equal Skin: warm and dry  Neuro:  CNs 2-12 intact, no focal abnormalities noted Psych:  Normal affect   EKG:  The EKG was personally reviewed and demonstrates:  Atrial fibrillation with VR, 112bpm, LAD, RBBB, TWI III Telemetry:  Telemetry was personally reviewed and demonstrates:  Atrial fibrillation, heart rates 90s with some rates to 110s.   Relevant CV Studies:  Echo pending 03/13/19  Echo 11/11/18 @ [facility]  Normal left ventricular systolic function, ejection fraction 60 to 65%  Normal right ventricular systolic function  Left ventricular hypertrophy - mild  Diastolic dysfunction - grade II (elevated filling pressures)  Mitral annular calcification  Dilated left atrium - mild  Aortic sclerosis  Long term Monitor Interpretation 01/31/2019 Persistent atrial fibrillation with fair ventricular rate control, but with frequent rapid ventricular response during activity. Bradycardia is not seen. No significant ventricular arrhythmia.  Laboratory Data:  High Sensitivity Troponin:   Recent Labs  Lab 03/12/19 1614 03/12/19 1825  TROPONINIHS 4 4     Cardiac EnzymesNo results for input(s): TROPONINI in the last 168 hours. No results for input(s): TROPIPOC in the last 168 hours.  Chemistry Recent Labs  Lab 03/12/19 1614 03/13/19 0237  NA 139 137  K 3.9 3.2*  CL 101 104  CO2 24 23   GLUCOSE 94 117*  BUN 12 9  CREATININE 0.88 0.90  CALCIUM 10.2 9.5  GFRNONAA >60 59*  GFRAA >60 >60  ANIONGAP 14 10    Recent Labs  Lab 03/13/19 0237  PROT 7.2  ALBUMIN 4.0  AST 21  ALT 21  ALKPHOS 69  BILITOT 0.9   Hematology Recent Labs  Lab 03/12/19 1614 03/13/19 0237  WBC 7.3 8.2  RBC 5.39* 5.26*  HGB 16.8* 16.3*  HCT 50.1* 47.8*  MCV 92.9 90.9  MCH 31.2 31.0  MCHC 33.5 34.1  RDW 13.3 13.3  PLT 274 258   BNP Recent Labs  Lab 03/13/19 1018  BNP 635.2*    DDimer No results for input(s): DDIMER in the last 168 hours.   Radiology/Studies:  Ct Angio Chest Pe W And/or Wo Contrast  Result Date: 03/12/2019 CLINICAL DATA:  Chest heaviness and diaphoresis EXAM: CT ANGIOGRAPHY CHEST WITH CONTRAST TECHNIQUE: Multidetector CT imaging of the chest was performed using the standard protocol during bolus administration of intravenous contrast. Multiplanar CT image reconstructions and MIPs were obtained to evaluate the vascular anatomy. CONTRAST:  57mL OMNIPAQUE IOHEXOL 350 MG/ML SOLN COMPARISON:  03/12/2019 radiograph FINDINGS: Cardiovascular: Satisfactory opacification of the pulmonary arteries to the segmental level. No evidence of pulmonary embolism. Nonaneurysmal aorta. Mild aortic atherosclerosis. Coronary vascular calcification. Mild cardiomegaly. No pericardial effusion. Mediastinum/Nodes: Midline trachea. No thyroid mass. No significant adenopathy. Small distal esophageal hiatal hernia Lungs/Pleura: Lungs are clear. No pleural effusion or pneumothorax. Upper Abdomen: No acute abnormality. Musculoskeletal: Degenerative changes. Incompletely visualized superior endplate fracture at K48. Review of the MIP images confirms the above findings. IMPRESSION: 1. Negative for acute pulmonary embolus. 2. Clear lung fields Aortic Atherosclerosis (ICD10-I70.0). Electronically Signed   By: Donavan Foil M.D.   On: 03/12/2019 21:46   Dg Chest Portable 1 View  Result Date:  03/12/2019 CLINICAL DATA:  Chest discomfort EXAM: PORTABLE CHEST 1 VIEW COMPARISON:  September 13, 2016 FINDINGS: There is no edema or consolidation. Heart is mildly enlarged with pulmonary vascularity normal. Soft tissue prominence right paratracheal region is stable and felt to represent great vessel prominence. No adenopathy. No pneumothorax. No bone lesions. IMPRESSION: No edema or consolidation. Stable cardiac silhouette. No evident adenopathy. Electronically Signed   By: Lowella Grip III M.D.   On: 03/12/2019 18:25    Assessment and Plan:   1. Afib RVR -Patient with recent diagnosis (at the beginning of the year) of afib started on Eliquis presents to the ED with DOE and chest pressure. Was given her home dose of Toprol 50mg  for rate control. IV cardizem was added. Eliquis switched to heparin -EKG showed afib RVR 112 bpm, RBBB, LAD -HS Troponin 4>4 -Potassium 3.2 -TSH 7.938 -Last Echo 11/11/18 @ [facility] showedLVEF 60 to 65%, normal right ventricular systolic function, mild left ventricular hypertrophy, G2DD (elevated filling pressures), mitral annular calcification, mildly dilated left atrium, aortic sclerosis -Echo pending  -Appears rates improving, 90s with some still in the 100s. Monitor   -Will pursue cardiac cath at this time to rule out ischemia  2. Chest pressure/CAD s/p DES to LAD 2006, repeat cath in 2010 patent stent with 50% stenosis in mid LAD and 80% distally -Pressure/tighhtness possibly secondary to Afib RVR  Vs ischemia -EKG with no ischemic changes -HS troponin negative -Hx of CAD with DES to LAD in 2006, repeat heart cath in 2010 noted patent stent  -Imdur 175mcg.  -Toprol 50mg  daily -ASA 81mg  -Continue heparin -Will plan for Cardiac Cath tomorrow   3. Acute diastolic CHF -Patient was having lower leg edema and orthopnea and started on lasix 40mg  daily by cardiology in May. PCP however discontinued it. -Last couple days patient has had orthopnea and  nocturnal dyspnea, but no lower leg edema. -Last Echo 11/11/18 @ [place] 60 to 65%, normal right ventricular systolic function, mild left ventricular hypertrophy, G2DD (elevated filling pressures), mitral annular calcification, mildly dilated left atrium, aortic sclerosis -In the ER BNP 635. IV Lasix x 1 dose given and patient improved -Patient appears euvolemic on exam.  -IV Lasix 20mg  daily -Creatinine 0.9 -Strict I&Os -Daily BMET.   4. Hypothyroidism -TSH 7.938 -Patient was on Synthroid 35mcg. -per IM increased to 165mcg daily  5. HTN -Elevated on admission, improving -Toprol 50mg  daily and Losartan 100mg  daily, home meds. Valsartan held -IV Cardizem -Monitor  6. Hypokalemia -3.2 on admission -Repleat -Recheck BMET  For questions or updates, please contact [place] Please consult [email or web address] for contact info under     Signed, Cadence Ninfa Meeker, PA-C  03/13/2019 11:28 AM    Patient seen and examined. Agree with assessment and plan.  [name] [name] is an 85 year old female patient who is a former patient of Dr. Aundra Dubin.  She has known CAD and underwent DES stenting to her LAD in 2006.  Her last catheterization in 2010 showed a patent proximal LAD stent and 50% mid LAD stenosis in addition to 80% distal stenosis, and 50% stenosis in a diagonal vessel.  She has a history of prior breast cancer status post surgery in 2000 followed by treatment with tamoxifen, lumbar spine surgery rotator cuff surgery.  There also has a history of possible sleep apnea.  Over the last several months she has noticed more episodes of chest tightness but she denies specific chest pain.  In March 2020 she was evaluated by Dr. Dwyane Dee at [facility] and apparently was in atrial fibrillation.  Eliquis was initiated.  She was on aspirin and was also started on metoprolol.  She saw Dr. Recardo Evangelist in a telemedicine evaluation in May.  She states that she has taken Eliquis with excellent  compliance.  Recently she has noticed the development of exertional dyspnea but more often her symptoms seem to be first precipitated by a chest fullness and tightness.  Yesterday she experienced significant chest tightness like a weight sitting on her chest associated with increasing shortness of breath prompting her emergency room evaluation.  Her Eliquis was last given yesterday morning and last evening she was started on heparin therapy.  Presently she is pain-free.  She is in atrial fibrillation with a ventricular rate in the upper 80s to 90s on a Cardizem drip.  HEENT is unremarkable.  She did not have carotid bruits.  Her lungs were clear.  There was no chest wall tenderness.  Rhythm was irregular irregular in the upper 80s to 91Y with 1/6 systolic murmur.  Abdomen is soft and nontender.  Pulses were 2+.  There was no significant edema.  Neurologically she was nonfocal.  She had a normal affect and mood.  She is scheduled to undergo 2D echo Doppler study today which has not yet been performed.  In light of her progressive chest tightness associated with significant dyspnea with activity now that she is off Eliquis since yesterday morning on IV heparin recommend definitive diagnostic cardiac catheterization to assess for progressive CAD and ischemia mediated dyspnea.  A remote echo in earlier this year had shown an EF of 60 to 65% with grade 2 diastolic dysfunction.  Discussed the rationale to undergo definitive repeat catheterization.The risks and benefits of a cardiac catheterization including, but not limited to, death, stroke, MI, kidney damage and bleeding were discussed with the patient who indicates understanding and agrees to proceed.  We will plan for the catheterization study to be done tomorrow.  If she continues to be in atrial fibrillation she will be a candidate for cardioversion.  Labs are notable for normal renal function.  Initial potassium is 3.2, will replete to 4.Mg   BNP at 635 is  consistent with known diastolic heart failure with possible ischemia mediated additional LV dysfunction.   Troy Sine, MD, [facility] 03/13/2019 3:10 PM

## 2019-03-13 NOTE — Progress Notes (Addendum)
PROGRESS NOTE    [name] [name]  JEH:[id number] DOB: Feb 12, 1934 DOA: 03/12/2019 PCP: [facility]  Brief Narrative: Patient seen and examined, this is an 85/F with history of CAD, DES to LAD in 2006, obesity, type 2 diabetes mellitus, mild sleep apnea, recent diagnosis of paroxysmal atrial fibrillation on Eliquis. -Presented to the ED with complaints of ongoing dyspnea on exertion since Saturday, accompanied by chest pressure. -In the emergency room she was noted to be in atrial fibrillation with RVR, CT angiogram was negative for pulmonary embolism, EKG with RBBB, negative high-sensitivity troponin   Assessment & Plan:   Principal Problem:   Atrial fibrillation with RVR ([place]) -chads2vasc score >3, she is on Eliquis at baseline, this was switched to IV heparin by admitting MD in the event she needs an invasive work-up -Heart rate is uncontrolled, continued on Toprol-XL at 50 mg, add Cardizem drip this morning -Recheck echocardiogram  Chest pressure/unstable angina -Likely worsened in the setting of A. fib RVR, ongoing chest pressure since Saturday -EKG nonspecific, high-sensitivity troponin is negative -History of CAD with DES to LAD in 2006, repeat heart catheterization in 2010 noted patent stent and 50% stenosis in mid LAD and 80% distally -Will ask cardiology to consult to determine if further ischemia evaluation is indicated at this time -Continue aspirin, beta-blocker, statin  Mild acute diastolic CHF -Patient reports symptoms suggestive of orthopnea, leg swelling on the days she did not keep her legs elevated, was started on Lasix by cardiology in May, reports that her PCP stopped this -Improved after Lasix IV x1 last p.m., will give another dose of Lasix today -Recent Echo 3/20 at [place] -LVEF 60-65%, G2DD -Does not appear overtly volume overloaded at this time    Hypothyroidism -TSH is high suggesting hypothyroidism, will increase Synthroid dose to 100 mcg from 88,  given only slight increase in dose do not anticipate this affecting her heart rate much  Low-grade temp -Resolved, no signs or symptoms of infection -COVID-19 PCR is negative -No leukocytosis, monitor  Essential hypertension -Continue Toprol, Cardizem drip this morning, hold ARB given Cardizem drip and IV Lasix this morning  DVT prophylaxis: IV heparin Code Status: Full code Family Communication: No family at bedside, will update daughter later today Disposition Plan: To be determined  Consultants:   Cardiology   Procedures:   Antimicrobials:    Subjective: -Breathing better this morning, has been having chest pressure since Saturday, feels like an elephant is sitting on her chest, also ongoing orthopnea for 4 days, has intermittent lower extremity edema especially notices this at the end of the day when she does not keep her legs elevated  Objective: Vitals:   03/13/19 0200 03/13/19 0330 03/13/19 0500 03/13/19 0800  BP: (!) 144/113 (!) 161/95 (!) 143/98 (!) 159/104  Pulse: 90 97 93 87  Resp:    14  Temp:      TempSrc:      SpO2: 97% 96% 97% 97%  Weight:      Height:        Intake/Output Summary (Last 24 hours) at 03/13/2019 1023 Last data filed at 03/12/2019 1930 Gross per 24 hour  Intake 1000 ml  Output -  Net 1000 ml   Filed Weights   03/13/19 0127  Weight: 81.6 kg    Examination:  General exam: Elderly pleasant female sitting up in bed, AAO x3, no distress Respiratory system: Clear bilaterally Cardiovascular system: S1-S2/regular rate rhythm  gastrointestinal system: Abdomen is nondistended, soft and nontender.Normal bowel  sounds heard. Central nervous system: Alert and oriented. No focal neurological deficits. Extremities: No edema Skin: No rashes, lesions or ulcers Psychiatry: Judgement and insight appear normal. Mood & affect appropriate.     Data Reviewed:   CBC: Recent Labs  Lab 03/12/19 1614 03/13/19 0237  WBC 7.3 8.2  HGB 16.8* 16.3*   HCT 50.1* 47.8*  MCV 92.9 90.9  PLT 274 998   Basic Metabolic Panel: Recent Labs  Lab 03/12/19 1614 03/12/19 1825 03/13/19 0237  NA 139  --  137  K 3.9  --  3.2*  CL 101  --  104  CO2 24  --  23  GLUCOSE 94  --  117*  BUN 12  --  9  CREATININE 0.88  --  0.90  CALCIUM 10.2  --  9.5  MG  --  2.2  --    GFR: Estimated Creatinine Clearance: 47.9 mL/min (by C-G formula based on SCr of 0.9 mg/dL). Liver Function Tests: Recent Labs  Lab 03/13/19 0237  AST 21  ALT 21  ALKPHOS 69  BILITOT 0.9  PROT 7.2  ALBUMIN 4.0   No results for input(s): LIPASE, AMYLASE in the last 168 hours. No results for input(s): AMMONIA in the last 168 hours. Coagulation Profile: No results for input(s): INR, PROTIME in the last 168 hours. Cardiac Enzymes: No results for input(s): CKTOTAL, CKMB, CKMBINDEX, TROPONINI in the last 168 hours. BNP (last 3 results) No results for input(s): PROBNP in the last 8760 hours. HbA1C: No results for input(s): HGBA1C in the last 72 hours. CBG: Recent Labs  Lab 03/13/19 0114 03/13/19 0537 03/13/19 0730  GLUCAP 150* 94 97   Lipid Profile: No results for input(s): CHOL, HDL, LDLCALC, TRIG, CHOLHDL, LDLDIRECT in the last 72 hours. Thyroid Function Tests: Recent Labs    03/12/19 2232  TSH 7.938*   Anemia Panel: No results for input(s): VITAMINB12, FOLATE, FERRITIN, TIBC, IRON, RETICCTPCT in the last 72 hours. Urine analysis:    Component Value Date/Time   COLORURINE YELLOW 03/12/2019 1843   APPEARANCEUR CLEAR 03/12/2019 1843   LABSPEC 1.017 03/12/2019 1843   PHURINE 5.0 03/12/2019 1843   GLUCOSEU NEGATIVE 03/12/2019 1843   HGBUR MODERATE (A) 03/12/2019 1843   BILIRUBINUR NEGATIVE 03/12/2019 1843   KETONESUR NEGATIVE 03/12/2019 1843   PROTEINUR 30 (A) 03/12/2019 1843   UROBILINOGEN 1.0 06/25/2015 0932   NITRITE NEGATIVE 03/12/2019 1843   LEUKOCYTESUR SMALL (A) 03/12/2019 1843   Sepsis Labs: @LABRCNTIP (procalcitonin:4,lacticidven:4)  )  Recent Results (from the past 240 hour(s))  Culture, blood (routine x 2)     Status: None (Preliminary result)   Collection Time: 03/12/19  6:24 PM   Specimen: BLOOD  Result Value Ref Range Status   Specimen Description BLOOD LEFT ANTECUBITAL  Final   Special Requests   Final    BOTTLES DRAWN AEROBIC AND ANAEROBIC Blood Culture adequate volume   Culture   Final    NO GROWTH < 12 HOURS Performed at [facility], [place] [address], [place], [place] [address]    Report Status PENDING  Incomplete  SARS Coronavirus 2 (CEPHEID- Performed in [place] hospital lab), Hosp Order     Status: None   Collection Time: 03/12/19  6:25 PM   Specimen: Nasopharyngeal Swab  Result Value Ref Range Status   SARS Coronavirus 2 NEGATIVE NEGATIVE Final    Comment: (NOTE) If result is NEGATIVE SARS-CoV-2 target nucleic acids are NOT DETECTED. The SARS-CoV-2 RNA is generally detectable in upper and  lower  respiratory specimens during the acute phase of infection. The lowest  concentration of SARS-CoV-2 viral copies this assay can detect is 250  copies / mL. A negative result does not preclude SARS-CoV-2 infection  and should not be used as the sole basis for treatment or other  patient management decisions.  A negative result may occur with  improper specimen collection / handling, submission of specimen other  than nasopharyngeal swab, presence of viral mutation(s) within the  areas targeted by this assay, and inadequate number of viral copies  (<250 copies / mL). A negative result must be combined with clinical  observations, patient history, and epidemiological information. If result is POSITIVE SARS-CoV-2 target nucleic acids are DETECTED. The SARS-CoV-2 RNA is generally detectable in upper and lower  respiratory specimens dur ing the acute phase of infection.  Positive  results are indicative of active infection with SARS-CoV-2.  Clinical  correlation with patient history and other diagnostic  information is  necessary to determine patient infection status.  Positive results do  not rule out bacterial infection or co-infection with other viruses. If result is PRESUMPTIVE POSTIVE SARS-CoV-2 nucleic acids MAY BE PRESENT.   A presumptive positive result was obtained on the submitted specimen  and confirmed on repeat testing.  While 2019 novel coronavirus  (SARS-CoV-2) nucleic acids may be present in the submitted sample  additional confirmatory testing may be necessary for epidemiological  and / or clinical management purposes  to differentiate between  SARS-CoV-2 and other Sarbecovirus currently known to infect humans.  If clinically indicated additional testing with an alternate test  methodology [phone]) is advised. The SARS-CoV-2 RNA is generally  detectable in upper and lower respiratory sp ecimens during the acute  phase of infection. The expected result is Negative. Fact Sheet for Patients:  StrictlyIdeas.no Fact Sheet for Healthcare Providers: BankingDealers.co.za This test is not yet approved or cleared by the [place] FDA and has been authorized for detection and/or diagnosis of SARS-CoV-2 by FDA under an Emergency Use Authorization (EUA).  This EUA will remain in effect (meaning this test can be used) for the duration of the COVID-19 declaration under Section 564(b)(1) of the Act, 21 U.S.C. section 360bbb-3(b)(1), unless the authorization is terminated or revoked sooner. Performed at [facility], [place] [address], [place], [place] [address]   Culture, blood (routine x 2)     Status: None (Preliminary result)   Collection Time: 03/12/19 10:32 PM   Specimen: BLOOD  Result Value Ref Range Status   Specimen Description BLOOD LEFT RADIAL  Final   Special Requests   Final    BOTTLES DRAWN AEROBIC AND ANAEROBIC Blood Culture results may not be optimal due to an inadequate volume of blood received in culture  bottles   Culture   Final    NO GROWTH < 12 HOURS Performed at [facility], [place] [address], [place], [place] [address]    Report Status PENDING  Incomplete         Radiology Studies: Ct Angio Chest Pe W And/or Wo Contrast  Result Date: 03/12/2019 CLINICAL DATA:  Chest heaviness and diaphoresis EXAM: CT ANGIOGRAPHY CHEST WITH CONTRAST TECHNIQUE: Multidetector CT imaging of the chest was performed using the standard protocol during bolus administration of intravenous contrast. Multiplanar CT image reconstructions and MIPs were obtained to evaluate the vascular anatomy. CONTRAST:  48mL OMNIPAQUE IOHEXOL 350 MG/ML SOLN COMPARISON:  03/12/2019 radiograph FINDINGS: Cardiovascular: Satisfactory opacification of the pulmonary arteries to the segmental level.  No evidence of pulmonary embolism. Nonaneurysmal aorta. Mild aortic atherosclerosis. Coronary vascular calcification. Mild cardiomegaly. No pericardial effusion. Mediastinum/Nodes: Midline trachea. No thyroid mass. No significant adenopathy. Small distal esophageal hiatal hernia Lungs/Pleura: Lungs are clear. No pleural effusion or pneumothorax. Upper Abdomen: No acute abnormality. Musculoskeletal: Degenerative changes. Incompletely visualized superior endplate fracture at O53. Review of the MIP images confirms the above findings. IMPRESSION: 1. Negative for acute pulmonary embolus. 2. Clear lung fields Aortic Atherosclerosis (ICD10-I70.0). Electronically Signed   By: Donavan Foil M.D.   On: 03/12/2019 21:46   Dg Chest Portable 1 View  Result Date: 03/12/2019 CLINICAL DATA:  Chest discomfort EXAM: PORTABLE CHEST 1 VIEW COMPARISON:  September 13, 2016 FINDINGS: There is no edema or consolidation. Heart is mildly enlarged with pulmonary vascularity normal. Soft tissue prominence right paratracheal region is stable and felt to represent great vessel prominence. No adenopathy. No pneumothorax. No bone lesions. IMPRESSION: No edema or consolidation.  Stable cardiac silhouette. No evident adenopathy. Electronically Signed   By: Lowella Grip III M.D.   On: 03/12/2019 18:25        Scheduled Meds: . aspirin EC  81 mg Oral Daily  . diltiazem  10 mg Intravenous Once  . furosemide  20 mg Intravenous Daily  . insulin aspart  0-9 Units Subcutaneous Q4H  . isosorbide mononitrate  120 mg Oral Daily  . levothyroxine  88 mcg Oral Daily  . metoprolol succinate  50 mg Oral Daily  . potassium chloride  40 mEq Oral Once  . potassium chloride  40 mEq Oral Daily  . sodium chloride flush  3 mL Intravenous Q12H  . traZODone  225 mg Oral QHS  . venlafaxine XR  150 mg Oral BID   Continuous Infusions: . sodium chloride    . diltiazem (CARDIZEM) infusion    . heparin 1,000 Units/hr (03/13/19 0211)     LOS: 0 days    Time spent: 18min    Domenic Polite, MD [facility]   03/13/2019, 10:23 AM

## 2019-03-13 NOTE — Progress Notes (Signed)
Attempted echo at 1:45 pm. Pt unavailable. Will attempt at later time.

## 2019-03-13 NOTE — ED Notes (Signed)
PAGED ADMITTING PROVIDER PER RN AUDREY

## 2019-03-13 NOTE — ED Triage Notes (Signed)
TC to DR Domenic Polite to confirm total dose of 79meq K is neded. I also reported lower SBP  after cardizem drip started was 127  Then SBP 108. Does the lasix Iv need to be given. Pt does not have any chest pressure or CP.

## 2019-03-13 NOTE — H&P (View-Only) (Signed)
Cardiology Consultation:   Patient ID: [name] [name] MRN: [id number]; DOB: 1934-04-27  Admit date: 03/12/2019 Date of Consult: 03/13/2019  Primary Care Provider: [facility] [place] Primary Cardiologist: [name], MD  Primary Electrophysiologist:  None    Patient Profile:   [name] [name] is a 85 y.o. female with a hx of CAD (DES to LAD in 2006), obesity, type 2 diabetes mellitus, mild sleep apnea, Chronic diastolic HF (EF 95-62%, Z3YQ 2020), recent diagnosis of paroxysmal atrial fibrillation on Eliquis who is being seen today for the evaluation of dyspnea on exertion and chest pressure at the request of Dr. Jacinta Shoe.  History of Present Illness:   Past history includes coronary artery disease with placement of a drug-eluting stent to the LAD artery in 2006. At that time she was being seen by Dr. Loralie Champagne. Repeat cardiac catheterization for chest pain in 2010 showed a patent proximal LAD stent and a 50% mid LAD stenosis similar to prior cath in 2006. Echo in 2014 showed LVEF 60-65% with G2DD, mild AR, mildly dilated left atrium, PA peak pressure 63mmhg. Patient followed with Dr. Aundra Dubin until December 2016 and then did not have any cardiology follow-up for a few years.  10/17/18 patient was seen at [facility] cardiology for an irregular heart rhythm and was evaluated by Dr. Dwyane Dee.  At that visit a heart rate of 127 bpm was documented.  Her blood pressure was normal at 132/80 and she weighed 183 pounds. Electrocardiogram was performed which showed atrial fibrillation with rapid ventricular response, but this is not immediately available for review.  Anticoagulation with Eliquis was started;  was also continued on aspirin. Metoprolol 50mg  BID was started as well. TSH at that visit was 3.850.   The last televisit with Dr. Sallyanne Kuster was 12/19/18 new patient visit for exertional dyspnea and atrial fibrillation.  Patient was positive for orthopnea but denied leg edema. Echo 11/11/18 @ [place] showed  LVEF 60 to 65%, normal right ventricular systolic function, mild left ventricular hypertrophy, G2DD (elevated filling pressures), mitral annular calcification, mildly dilated left atrium, aortic sclerosis. Negative for chest pain although had been on Imdur. Lexiscan Myoview was considered at that time. Patient was started on Lasix 40mg  daily. ASA and HCTZ were stopped. Possible plan was for CDDV after 4 weeks of uninterrupted a/c with likely addition of antiarrhythmic since patient might not likely stay in sinus due to age and dilated left atrium.  A Long term Monitor was placed which showed persistent atrial fibrillation with fair ventricular rate control, but with frequent rapid ventricular response during activity. Bradycardia is not seen. No significant ventricular arrhythmia.  [name] [name] presented to the ED 7/218 for dyspnea on exertion and constant chest pressure x 3 days. Pressure is central and non-radiating.The pressure has been constant with no alleviating factors. Not worse on exertion. She has never felt this before. She has associated SOB. Positive for orthopnea and paroxysmal nocturnal dyspnea. Denies diaphoresis, N/V, syncope, cough, or other recent illnesses. Has not missed any doses of Elliquis.  In the ED patient was noted to be in atrial fibrillation RVR 112 bpm. EKG also showed RBB, and LAD. T 98.2, B/P 109/60, 99% RA, RR16.  Labs showed potassium 3.2, Magnesium 2.2, Creatinine 0.9. BNP 635, HS troponin 4>4. WBC 8.2, HGB 16.3. TSH 7.938. COVID negative. CT angiogram was negative for PE. Patient was given Toprol 50mg  with no improvement in heart rate. Cardizem drip was started. Patient was switched from Eliquis to heparin and admitted for further  work-up.   Heart Pathway Score:     Past Medical History:  Diagnosis Date   Anginal pain ([place])    Anxiety    Arthritis    "little in my thumbs"back & knees    Breast cancer ([place])    R breast, treated with mastectomy/tomoxifen in early 2000s    Bronchitis    hx of   CAD (coronary artery disease)    The patient had a left heart catheterization in August 2006 showed an EF of 70%, a 70% mid LAD lesion and 80% distal LAD ledsion, as well as 50% ostial first diagonal lesion.  The patient did have a drug-eluting stent placed in her mid LAD at that time.     Depression    Diastolic heart failure    Echo 11/10 with EF 55-60%, moderate diastolic dysfunction, no regional WMAs, mild to moderate TR, moderate LAE, mild AI   Frequent urination at night    GERD (gastroesophageal reflux disease)    "gone since gallbladder took out"   Heart murmur    "small"   Hypercholesterolemia    Hypertension    Hypothyroidism    Mental disorder    Sleep disturbance    sleep study done- told that there wasn't any apnea concerns, states she is up & down for use of bathroom several times per night    Type II diabetes mellitus ([place])    "controlled w/diet and exercise"   Urinary incontinence     Past Surgical History:  Procedure Laterality Date   25 GAUGE PARS PLANA VITRECTOMY WITH 20 GAUGE MVR PORT Right 06/04/2013   Procedure: 25 GAUGE PARS PLANA VITRECTOMY WITH 20 GAUGE MVR PORT / REMOVAL SILICONE OIL RIGHT EYE. With Endolaser;  Surgeon: Hayden Pedro, MD;  Location: [place];  Service: Ophthalmology;  Laterality: Right;   ABDOMINAL HYSTERECTOMY  1964   Adenosine Myoview     9/09: EF 69% with normal perfusion images suggesting no ischemia or infarction.     [place] SURGERY  06/2015   fusion-lumbar   BREAST BIOPSY  2001   right   CARDIAC CATHETERIZATION     CATARACT EXTRACTION W/ INTRAOCULAR LENS  IMPLANT, BILATERAL  ~ 2000   CHOLECYSTECTOMY  ~ 2004   COLONOSCOPY     CORONARY ANGIOPLASTY     CORONARY ANGIOPLASTY WITH STENT PLACEMENT  2000's   "1"   DILATION AND CURETTAGE OF UTERUS  1960's   "I had 3"   EYE SURGERY     /iol    following cataract removal   GAS/FLUID EXCHANGE Right 06/04/2013    Procedure: GAS/FLUID EXCHANGE;  Surgeon: Hayden Pedro, MD;  Location: [place];  Service: Ophthalmology;  Laterality: Right;   MASTECTOMY  2001   right breast   MEMBRANE PEEL Right 06/04/2013   Procedure: MEMBRANE PEEL;  Surgeon: Hayden Pedro, MD;  Location: [place];  Service: Ophthalmology;  Laterality: Right;   NEUROPLASTY / TRANSPOSITION MEDIAN NERVE AT CARPAL TUNNEL BILATERAL  1990's   PARS PLANA VITRECTOMY  04/03/2012   right   PARS PLANA VITRECTOMY  04/03/2012   Procedure: PARS PLANA VITRECTOMY WITH 25 GAUGE;  Surgeon: Hayden Pedro, MD;  Location: [place];  Service: Ophthalmology;  Laterality: Right;  Silicone Oil, Perfluron, Repair Complex Traction Retinal Detachment   PLANTAR FASCIA RELEASE  1990's   left   TONSILLECTOMY AND ADENOIDECTOMY  1972   TOTAL KNEE ARTHROPLASTY Left 10/13/2015   Procedure: TOTAL KNEE ARTHROPLASTY;  Surgeon: Melrose Nakayama, MD;  Location: [place];  Service: Orthopedics;  Laterality: Left;   TOTAL KNEE ARTHROPLASTY Right 09/20/2016   Procedure: TOTAL KNEE ARTHROPLASTY;  Surgeon: Melrose Nakayama, MD;  Location: [place];  Service: Orthopedics;  Laterality: Right;   ULNAR NERVE REPAIR     left arm     Home Medications:  Prior to Admission medications   Medication Sig Start Date End Date Taking? Authorizing Provider  apixaban (ELIQUIS) 5 MG TABS tablet Take 5 mg by mouth 2 (two) times daily.  10/17/18  Yes [provider]  aspirin EC 81 MG tablet Take 81 mg by mouth daily.   Yes [provider]  calcium carbonate (OS-CAL) 600 MG TABS tablet Take 600 mg by mouth daily with breakfast.   Yes [provider]  furosemide (LASIX) 40 MG tablet Take 1 tablet (40 mg total) by mouth daily. 12/19/18 03/19/19 Yes Croitoru, Mihai, MD  hydrochlorothiazide (MICROZIDE) 12.5 MG capsule Take 12.5 mg by mouth daily.   Yes [provider]  isosorbide mononitrate (IMDUR) 120 MG 24 hr tablet Take 120 mg by mouth Daily.  04/19/11  Yes [provider]  levothyroxine (SYNTHROID) 75 MCG tablet Take 75 mcg by mouth daily. 01/25/19  Yes [provider]  losartan (COZAAR) 100 MG tablet Take 1 tablet (100 mg total) by mouth daily. 07/01/11  Yes Larey Dresser, MD  metoprolol tartrate (LOPRESSOR) 50 MG tablet Take 50 mg by mouth 2 (two) times a day. 11/17/18  Yes [provider]  traZODone (DESYREL) 150 MG tablet Take 225 mg by mouth at bedtime.    Yes [provider]  venlafaxine XR (EFFEXOR-XR) 150 MG 24 hr capsule Take 150 mg by mouth 2 (two) times a day.    Yes [provider]  methocarbamol (ROBAXIN) 500 MG tablet Take 1 tablet (500 mg total) by mouth every 6 (six) hours as needed for muscle spasms. Patient not taking: Reported on 03/12/2019 09/23/16   Loni Dolly, PA-C  traMADol (ULTRAM) 50 MG tablet Take 1 tablet (50 mg total) by mouth every 6 (six) hours as needed. Patient not taking: Reported on 03/12/2019 09/23/16   Loni Dolly, PA-C    Inpatient Medications: Scheduled Meds:  aspirin EC  81 mg Oral Daily   diltiazem  10 mg Intravenous Once   furosemide  20 mg Intravenous Daily   insulin aspart  0-9 Units Subcutaneous Q4H   isosorbide mononitrate  120 mg Oral Daily   [START ON 03/14/2019] levothyroxine  100 mcg Oral Q breakfast   metoprolol succinate  50 mg Oral Daily   potassium chloride  40 mEq Oral Once   potassium chloride  40 mEq Oral Daily   sodium chloride flush  3 mL Intravenous Q12H   traZODone  225 mg Oral QHS   venlafaxine XR  150 mg Oral BID   Continuous Infusions:  sodium chloride     diltiazem (CARDIZEM) infusion     heparin 1,000 Units/hr (03/13/19 0211)   PRN Meds: sodium chloride, acetaminophen **OR** acetaminophen, sodium chloride flush  Allergies:    Allergies  Allergen Reactions   No Known Allergies     Social History:   Social History   Socioeconomic History   Marital status: Married    Spouse name: Not on file   Number of children:  Not on file   Years of education: Not on file   Highest education level: Not on file  Occupational History   Occupation: Daycare provider  Employer: RETIRED    Comment: part-time  Scientist, product/process development strain: Not on file   Food insecurity    Worry: Not on file    Inability: Not on file   Transportation needs    Medical: Not on file    Non-medical: Not on file  Tobacco Use   Smoking status: Never Smoker   Smokeless tobacco: Never Used  Substance and Sexual Activity   Alcohol use: No   Drug use: No   Sexual activity: Never  Lifestyle   Physical activity    Days per week: Not on file    Minutes per session: Not on file   Stress: Not on file  Relationships   Social connections    Talks on phone: Not on file    Gets together: Not on file    Attends religious service: Not on file    Active member of club or organization: Not on file    Attends meetings of clubs or organizations: Not on file    Relationship status: Not on file   Intimate partner violence    Fear of current or ex partner: Not on file    Emotionally abused: Not on file    Physically abused: Not on file    Forced sexual activity: Not on file  Other Topics Concern   Not on file  Social History Narrative   Not on file    Family History:    Family History  Problem Relation Age of Onset   Cancer Mother    Alcoholism Father    Cancer Brother      ROS:  Please see the history of present illness.  All other ROS reviewed and negative.     Physical Exam/Data:   Vitals:   03/13/19 0200 03/13/19 0330 03/13/19 0500 03/13/19 0800  BP: (!) 144/113 (!) 161/95 (!) 143/98 (!) 159/104  Pulse: 90 97 93 87  Resp:    14  Temp:      TempSrc:      SpO2: 97% 96% 97% 97%  Weight:      Height:        Intake/Output Summary (Last 24 hours) at 03/13/2019 1128 Last data filed at 03/12/2019 1930 Gross per 24 hour  Intake 1000 ml  Output --  Net 1000 ml   Last 3 Weights 03/13/2019  12/20/2018 09/13/2016  Weight (lbs) 180 lb 180 lb 176 lb 14.4 oz  Weight (kg) 81.647 kg 81.647 kg 80.241 kg     Body mass index is 31.89 kg/m.  General:  Well nourished, well developed WF, in no acute distress HEENT: normal Neck: no JVD Endocrine:  No thryomegaly Vascular: No carotid bruits; FA pulses 2+ bilaterally without bruits  Cardiac:  normal S1, S2;Irregularly Irregular rhythym; no murmur  Lungs:  clear to auscultation bilaterally, no wheezing, rhonchi or rales  Abd: soft, nontender, no hepatomegaly  Ext: no edema Musculoskeletal:  No deformities, BUE and BLE strength normal and equal Skin: warm and dry  Neuro:  CNs 2-12 intact, no focal abnormalities noted Psych:  Normal affect   EKG:  The EKG was personally reviewed and demonstrates:  Atrial fibrillation with VR, 112bpm, LAD, RBBB, TWI III Telemetry:  Telemetry was personally reviewed and demonstrates:  Atrial fibrillation, heart rates 90s with some rates to 110s.   Relevant CV Studies:  Echo pending 03/13/19  Echo 11/11/18 @ [facility]  Normal left ventricular systolic function, ejection fraction 60 to 65%  Normal right ventricular systolic function  Left ventricular hypertrophy - mild  Diastolic dysfunction - grade II (elevated filling pressures)  Mitral annular calcification  Dilated left atrium - mild  Aortic sclerosis  Long term Monitor Interpretation 01/31/2019 Persistent atrial fibrillation with fair ventricular rate control, but with frequent rapid ventricular response during activity. Bradycardia is not seen. No significant ventricular arrhythmia.  Laboratory Data:  High Sensitivity Troponin:   Recent Labs  Lab 03/12/19 1614 03/12/19 1825  TROPONINIHS 4 4     Cardiac EnzymesNo results for input(s): TROPONINI in the last 168 hours. No results for input(s): TROPIPOC in the last 168 hours.  Chemistry Recent Labs  Lab 03/12/19 1614 03/13/19 0237  NA 139 137  K 3.9 3.2*  CL 101 104  CO2 24 23   GLUCOSE 94 117*  BUN 12 9  CREATININE 0.88 0.90  CALCIUM 10.2 9.5  GFRNONAA >60 59*  GFRAA >60 >60  ANIONGAP 14 10    Recent Labs  Lab 03/13/19 0237  PROT 7.2  ALBUMIN 4.0  AST 21  ALT 21  ALKPHOS 69  BILITOT 0.9   Hematology Recent Labs  Lab 03/12/19 1614 03/13/19 0237  WBC 7.3 8.2  RBC 5.39* 5.26*  HGB 16.8* 16.3*  HCT 50.1* 47.8*  MCV 92.9 90.9  MCH 31.2 31.0  MCHC 33.5 34.1  RDW 13.3 13.3  PLT 274 258   BNP Recent Labs  Lab 03/13/19 1018  BNP 635.2*    DDimer No results for input(s): DDIMER in the last 168 hours.   Radiology/Studies:  Ct Angio Chest Pe W And/or Wo Contrast  Result Date: 03/12/2019 CLINICAL DATA:  Chest heaviness and diaphoresis EXAM: CT ANGIOGRAPHY CHEST WITH CONTRAST TECHNIQUE: Multidetector CT imaging of the chest was performed using the standard protocol during bolus administration of intravenous contrast. Multiplanar CT image reconstructions and MIPs were obtained to evaluate the vascular anatomy. CONTRAST:  27mL OMNIPAQUE IOHEXOL 350 MG/ML SOLN COMPARISON:  03/12/2019 radiograph FINDINGS: Cardiovascular: Satisfactory opacification of the pulmonary arteries to the segmental level. No evidence of pulmonary embolism. Nonaneurysmal aorta. Mild aortic atherosclerosis. Coronary vascular calcification. Mild cardiomegaly. No pericardial effusion. Mediastinum/Nodes: Midline trachea. No thyroid mass. No significant adenopathy. Small distal esophageal hiatal hernia Lungs/Pleura: Lungs are clear. No pleural effusion or pneumothorax. Upper Abdomen: No acute abnormality. Musculoskeletal: Degenerative changes. Incompletely visualized superior endplate fracture at D78. Review of the MIP images confirms the above findings. IMPRESSION: 1. Negative for acute pulmonary embolus. 2. Clear lung fields Aortic Atherosclerosis (ICD10-I70.0). Electronically Signed   By: Donavan Foil M.D.   On: 03/12/2019 21:46   Dg Chest Portable 1 View  Result Date:  03/12/2019 CLINICAL DATA:  Chest discomfort EXAM: PORTABLE CHEST 1 VIEW COMPARISON:  September 13, 2016 FINDINGS: There is no edema or consolidation. Heart is mildly enlarged with pulmonary vascularity normal. Soft tissue prominence right paratracheal region is stable and felt to represent great vessel prominence. No adenopathy. No pneumothorax. No bone lesions. IMPRESSION: No edema or consolidation. Stable cardiac silhouette. No evident adenopathy. Electronically Signed   By: Lowella Grip III M.D.   On: 03/12/2019 18:25    Assessment and Plan:   1. Afib RVR -Patient with recent diagnosis (at the beginning of the year) of afib started on Eliquis presents to the ED with DOE and chest pressure. Was given her home dose of Toprol 50mg  for rate control. IV cardizem was added. Eliquis switched to heparin -EKG showed afib RVR 112 bpm, RBBB, LAD -HS Troponin 4>4 -Potassium 3.2 -TSH 7.938 -Last Echo 11/11/18 @ [facility] showedLVEF 60 to 65%, normal right ventricular systolic function, mild left ventricular hypertrophy, G2DD (elevated filling pressures), mitral annular calcification, mildly dilated left atrium, aortic sclerosis -Echo pending  -Appears rates improving, 90s with some still in the 100s. Monitor   -Will pursue cardiac cath at this time to rule out ischemia  2. Chest pressure/CAD s/p DES to LAD 2006, repeat cath in 2010 patent stent with 50% stenosis in mid LAD and 80% distally -Pressure/tighhtness possibly secondary to Afib RVR  Vs ischemia -EKG with no ischemic changes -HS troponin negative -Hx of CAD with DES to LAD in 2006, repeat heart cath in 2010 noted patent stent  -Imdur 142mcg.  -Toprol 50mg  daily -ASA 81mg  -Continue heparin -Will plan for Cardiac Cath tomorrow   3. Acute diastolic CHF -Patient was having lower leg edema and orthopnea and started on lasix 40mg  daily by cardiology in May. PCP however discontinued it. -Last couple days patient has had orthopnea and  nocturnal dyspnea, but no lower leg edema. -Last Echo 11/11/18 @ [facility] 60 to 65%, normal right ventricular systolic function, mild left ventricular hypertrophy, G2DD (elevated filling pressures), mitral annular calcification, mildly dilated left atrium, aortic sclerosis -In the ER BNP 635. IV Lasix x 1 dose given and patient improved -Patient appears euvolemic on exam.  -IV Lasix 20mg  daily -Creatinine 0.9 -Strict I&Os -Daily BMET.   4. Hypothyroidism -TSH 7.938 -Patient was on Synthroid 68mcg. -per IM increased to 168mcg daily  5. HTN -Elevated on admission, improving -Toprol 50mg  daily and Losartan 100mg  daily, home meds. Valsartan held -IV Cardizem -Monitor  6. Hypokalemia -3.2 on admission -Repleat -Recheck BMET  For questions or updates, please contact [facility] Please consult [email or web address] for contact info under     Signed, Cadence Ninfa Meeker, PA-C  03/13/2019 11:28 AM    Patient seen and examined. Agree with assessment and plan.  [name] [name] is an 85 year old female patient who is a former patient of Dr. Aundra Dubin.  She has known CAD and underwent DES stenting to her LAD in 2006.  Her last catheterization in 2010 showed a patent proximal LAD stent and 50% mid LAD stenosis in addition to 80% distal stenosis, and 50% stenosis in a diagonal vessel.  She has a history of prior breast cancer status post surgery in 2000 followed by treatment with tamoxifen, lumbar spine surgery rotator cuff surgery.  There also has a history of possible sleep apnea.  Over the last several months she has noticed more episodes of chest tightness but she denies specific chest pain.  In March 2020 she was evaluated by Dr. Dwyane Dee at [facility] and apparently was in atrial fibrillation.  Eliquis was initiated.  She was on aspirin and was also started on metoprolol.  She saw Dr. Recardo Evangelist in a telemedicine evaluation in May.  She states that she has taken Eliquis with excellent  compliance.  Recently she has noticed the development of exertional dyspnea but more often her symptoms seem to be first precipitated by a chest fullness and tightness.  Yesterday she experienced significant chest tightness like a weight sitting on her chest associated with increasing shortness of breath prompting her emergency room evaluation.  Her Eliquis was last given yesterday morning and last evening she was started on heparin therapy.  Presently she is pain-free.  She is in atrial fibrillation with a ventricular rate in the upper 80s to 90s on a Cardizem drip.  HEENT is unremarkable.  She did not have carotid bruits.  Her lungs were clear.  There was no chest wall tenderness.  Rhythm was irregular irregular in the upper 80s to 54O with 1/6 systolic murmur.  Abdomen is soft and nontender.  Pulses were 2+.  There was no significant edema.  Neurologically she was nonfocal.  She had a normal affect and mood.  She is scheduled to undergo 2D echo Doppler study today which has not yet been performed.  In light of her progressive chest tightness associated with significant dyspnea with activity now that she is off Eliquis since yesterday morning on IV heparin recommend definitive diagnostic cardiac catheterization to assess for progressive CAD and ischemia mediated dyspnea.  A remote echo in earlier this year had shown an EF of 60 to 65% with grade 2 diastolic dysfunction.  Discussed the rationale to undergo definitive repeat catheterization.The risks and benefits of a cardiac catheterization including, but not limited to, death, stroke, MI, kidney damage and bleeding were discussed with the patient who indicates understanding and agrees to proceed.  We will plan for the catheterization study to be done tomorrow.  If she continues to be in atrial fibrillation she will be a candidate for cardioversion.  Labs are notable for normal renal function.  Initial potassium is 3.2, will replete to 4.Mg   BNP at 635 is  consistent with known diastolic heart failure with possible ischemia mediated additional LV dysfunction.   Troy Sine, MD, [facility] 03/13/2019 3:10 PM

## 2019-03-13 NOTE — Progress Notes (Signed)
ANTICOAGULATION CONSULT NOTE - Follow Up Consult  Pharmacy Consult for Heparin Indication: atrial fibrillation  Allergies  Allergen Reactions  . No Known Allergies     Patient Measurements: Height: 5\' 3"  (160 cm) Weight: 179 lb 14.3 oz (81.6 kg) IBW/kg (Calculated) : 52.4 Heparin Dosing Weight: 70.3 kg  Vital Signs: Temp: 98.3 F (36.8 C) (07/29 1949) Temp Source: Oral (07/29 1949) BP: 107/68 (07/29 1949) Pulse Rate: [name] (07/29 1949)  Labs: Recent Labs    03/12/19 1614 03/12/19 1825 03/13/19 0237 03/13/19 1018 03/13/19 2021  HGB 16.8*  --  16.3*  --   --   HCT 50.1*  --  47.8*  --   --   PLT 274  --  258  --   --   APTT  --   --   --  38* 93*  HEPARINUNFRC  --   --   --  1.06*  --   CREATININE 0.88  --  0.90  --   --   TROPONINIHS 4 4  --   --   --     Estimated Creatinine Clearance: 47.9 mL/min (by C-G formula based on SCr of 0.9 mg/dL).   Medications:  Infusions:  . sodium chloride    . diltiazem (CARDIZEM) infusion    . heparin 1,200 Units/hr (03/13/19 1222)    Assessment: [name] [name] admitted with SOB on apixaban PTA for Afib. Pharmacy consulted to dose heparin while awaiting cardiology evaulation.   HL 1.06 - remains elevated as expected aPTT 93 secs - therapeutic - will continue to follow until Eliquis is no longer influencing heparin level. No bleeding noted.  Goal of Therapy:  Heparin level 0.3-0.7 units/ml  APTT level 66-102 secs Monitor platelets by anticoagulation protocol: Yes   Plan:  Continue heparin drip at 1200 units/hr  Daily heparin level, aPTT and CBC Monitor for s/sx of bleeding   Thank you for involving pharmacy in this patient's care.  Renold Genta, PharmD, BCPS Clinical Pharmacist Clinical phone for 03/13/2019 until 10:30p is [phone] 03/13/2019 9:47 PM  **Pharmacist phone directory can be found on [email or web address] listed under [place]**

## 2019-03-13 NOTE — ED Notes (Signed)
ED TO INPATIENT HANDOFF REPORT  ED Nurse Name and Phone #:  [phone]  S Name/Age/Gender [name] [name] 85 y.o. female Room/Bed: 027C/027C  Code Status   Code Status: Full Code  Home/SNF/Other Home Patient oriented to: self, place, time and situation Is this baseline? Yes   Triage Complete: Triage complete  Chief Complaint SOB  Triage Note Pt arrives with daughter from home with c/o of chest heaviness and periods of breaking out into a sweat.    Allergies Allergies  Allergen Reactions  . No Known Allergies     Level of Care/Admitting Diagnosis ED Disposition    ED Disposition Condition Comment   Admit  Hospital Area: [facility] [[id number]]  Level of Care: Progressive [102]  I expect the patient will be discharged within 24 hours: No (not a candidate for 5C-Observation unit)  Covid Evaluation: Confirmed COVID Negative  Diagnosis: Atrial fibrillation with [facility]) [[id number]]  Admitting Physician: Jani Gravel [[id number]]  Attending Physician: Jani [facility]vel [[id number]]  PT Class (Do Not Modify): Observation [104]  PT Acc Code (Do Not Modify): Observation [10022]       B Medical/Surgery History Past Medical History:  Diagnosis Date  . Anginal pain ([place])   . Anxiety   . Arthritis    "little in my thumbs"back & knees   . Breast cancer ([place])    R breast, treated with mastectomy/tomoxifen in early 2000s  . Bronchitis    hx of  . CAD (coronary artery disease)    The patient had a left heart catheterization in August 2006 showed an EF of 70%, a 70% mid LAD lesion and 80% distal LAD ledsion, as well as 50% ostial first diagonal lesion.  The patient did have a drug-eluting stent placed in her mid LAD at that time.    . Depression   . Diastolic heart failure    Echo 11/10 with EF 55-60%, moderate diastolic dysfunction, no regional WMAs, mild to moderate TR, moderate LAE, mild AI  . Frequent urination at night   . GERD (gastroesophageal reflux disease)    "gone since  gallbladder took out"  . Heart murmur    "small"  . Hypercholesterolemia   . Hypertension   . Hypothyroidism   . Mental disorder   . Sleep disturbance    sleep study done- told that there wasn't any apnea concerns, states she is up & down for use of bathroom several times per night   . Type II diabetes mellitus ([place])    "controlled w/diet and exercise"  . Urinary incontinence    Past Surgical History:  Procedure Laterality Date  . [place] VITRECTOMY WITH 20 GAUGE MVR PORT Right 06/04/2013   Procedure: 25 GAUGE PARS PLANA VITRECTOMY WITH 20 GAUGE MVR PORT / REMOVAL SILICONE OIL RIGHT EYE. With Endolaser;  Surgeon: Hayden Pedro, MD;  Location: [place];  Service: Ophthalmology;  Laterality: Right;  . ABDOMINAL HYSTERECTOMY  1964  . Adenosine Myoview     9/09: EF 69% with normal perfusion images suggesting no ischemia or infarction.    . APPENDECTOMY  1954  . BACK SURGERY  06/2015   fusion-lumbar  . BREAST BIOPSY  2001   right  . CARDIAC CATHETERIZATION    . CATARACT EXTRACTION W/ INTRAOCULAR LENS  IMPLANT, BILATERAL  ~ 2000  . CHOLECYSTECTOMY  ~ 2004  . COLONOSCOPY    . CORONARY ANGIOPLASTY    . CORONARY ANGIOPLASTY WITH STENT PLACEMENT  2000's   "1"  .  DILATION AND CURETTAGE OF UTERUS  1960's   "I had 3"  . EYE SURGERY     /iol    following cataract removal  . GAS/FLUID EXCHANGE Right 06/04/2013   Procedure: GAS/FLUID EXCHANGE;  Surgeon: Hayden Pedro, MD;  Location: [facility];  Service: Ophthalmology;  Laterality: Right;  . MASTECTOMY  2001   right breast  . MEMBRANE PEEL Right 06/04/2013   Procedure: MEMBRANE PEEL;  Surgeon: Hayden Pedro, MD;  Location: [place];  Service: Ophthalmology;  Laterality: Right;  . NEUROPLASTY / TRANSPOSITION MEDIAN NERVE AT CARPAL TUNNEL BILATERAL  1990's  . PARS PLANA VITRECTOMY  04/03/2012   right  . PARS PLANA VITRECTOMY  04/03/2012   Procedure: PARS PLANA VITRECTOMY WITH 25 GAUGE;  Surgeon: Hayden Pedro, MD;  Location: [place];   Service: Ophthalmology;  Laterality: Right;  Silicone Oil, Perfluron, Repair Complex Traction Retinal Detachment  . PLANTAR FASCIA RELEASE  1990's   left  . TONSILLECTOMY AND ADENOIDECTOMY  1972  . TOTAL KNEE ARTHROPLASTY Left 10/13/2015   Procedure: TOTAL KNEE ARTHROPLASTY;  Surgeon: Melrose Nakayama, MD;  Location: [facility];  Service: Orthopedics;  Laterality: Left;  . TOTAL KNEE ARTHROPLASTY Right 09/20/2016   Procedure: TOTAL KNEE ARTHROPLASTY;  Surgeon: Melrose Nakayama, MD;  Location: [place];  Service: Orthopedics;  Laterality: Right;  . ULNAR NERVE REPAIR     left arm     A IV Location/Drains/Wounds Patient Lines/Drains/Airways Status   Active Line/Drains/Airways    Name:   Placement date:   Placement time:   Site:   Days:   Peripheral IV 07/08/15 Left Wrist   07/08/15    0815    Wrist   1344   Peripheral IV 10/13/15 Left Forearm   10/13/15    0832    Forearm   1247   Peripheral IV 09/20/16 Left Hand   09/20/16    0849    Hand   904   Peripheral IV 03/12/19 Left Antecubital   03/12/19    1718    Antecubital   1   Incision (Closed) 07/08/15 Back Other (Comment)   07/08/15    0921     1344   Incision (Closed) 10/13/15 Knee Left   10/13/15    1107     1247   Incision (Closed) 09/20/16 Knee Right   09/20/16    1216     904          Intake/Output Last 24 hours  Intake/Output Summary (Last 24 hours) at 03/13/2019 0052 Last data filed at 03/12/2019 1930 Gross per 24 hour  Intake 1000 ml  Output -  Net 1000 ml    Labs/Imaging Results for orders placed or performed during the hospital encounter of 03/12/19 (from the past 48 hour(s))  Basic metabolic panel     Status: None   Collection Time: 03/12/19  4:14 PM  Result Value Ref Range   Sodium 139 135 - 145 mmol/L   Potassium 3.9 3.5 - 5.1 mmol/L   Chloride 101 98 - 111 mmol/L   CO2 24 22 - 32 mmol/L   Glucose, Bld 94 70 - 99 mg/dL   BUN 12 8 - 23 mg/dL   Creatinine, Ser 0.88 0.44 - 1.00 mg/dL   Calcium 10.2 8.9 - 10.3 mg/dL   GFR  calc non Af Amer >60 >60 mL/min   GFR calc Af Amer >60 >60 mL/min   Anion gap 14 5 - 15    Comment: Performed at  [facility], [place] [address] [place], [place] [address]  CBC     Status: Abnormal   Collection Time: 03/12/19  4:14 PM  Result Value Ref Range   WBC 7.3 4.0 - 10.5 K/uL   RBC 5.39 (H) 3.87 - 5.11 MIL/uL   Hemoglobin 16.8 (H) 12.0 - 15.0 g/dL   HCT 50.1 (H) 36.0 - 46.0 %   MCV 92.9 80.0 - 100.0 fL   MCH 31.2 26.0 - 34.0 pg   MCHC 33.5 30.0 - 36.0 g/dL   RDW 13.3 11.5 - 15.5 %   Platelets 274 150 - 400 K/uL   nRBC 0.0 0.0 - 0.2 %    Comment: Performed at [facility], [place] [address] [place], [place] [address]  Troponin I (High Sensitivity)     Status: None   Collection Time: 03/12/19  4:14 PM  Result Value Ref Range   Troponin I (High Sensitivity) 4 <18 ng/L    Comment: (NOTE) Elevated high sensitivity troponin I (hsTnI) values and significant  changes across serial measurements may suggest ACS but many other  chronic and acute conditions are known to elevate hsTnI results.  Refer to the "Links" section for chest pain algorithms and additional  guidance. Performed at [facility], [place] [address], [place], [place] [address]   Troponin I (High Sensitivity)     Status: None   Collection Time: 03/12/19  6:25 PM  Result Value Ref Range   Troponin I (High Sensitivity) 4 <18 ng/L    Comment: (NOTE) Elevated high sensitivity troponin I (hsTnI) values and significant  changes across serial measurements may suggest ACS but many other  chronic and acute conditions are known to elevate hsTnI results.  Refer to the "Links" section for chest pain algorithms and additional  guidance. Performed at [facility], [place] [address], [place], [place] [address]   SARS Coronavirus 2 (CEPHEID- Performed in [place] hospital lab), Hosp Order     Status: None   Collection Time: 03/12/19  6:25 PM   Specimen: Nasopharyngeal Swab  Result Value Ref Range   SARS  Coronavirus 2 NEGATIVE NEGATIVE    Comment: (NOTE) If result is NEGATIVE SARS-CoV-2 target nucleic acids are NOT DETECTED. The SARS-CoV-2 RNA is generally detectable in upper and lower  respiratory specimens during the acute phase of infection. The lowest  concentration of SARS-CoV-2 viral copies this assay can detect is 250  copies / mL. A negative result does not preclude SARS-CoV-2 infection  and should not be used as the sole basis for treatment or other  patient management decisions.  A negative result may occur with  improper specimen collection / handling, submission of specimen other  than nasopharyngeal swab, presence of viral mutation(s) within the  areas targeted by this assay, and inadequate number of viral copies  (<250 copies / mL). A negative result must be combined with clinical  observations, patient history, and epidemiological information. If result is POSITIVE SARS-CoV-2 target nucleic acids are DETECTED. The SARS-CoV-2 RNA is generally detectable in upper and lower  respiratory specimens dur ing the acute phase of infection.  Positive  results are indicative of active infection with SARS-CoV-2.  Clinical  correlation with patient history and other diagnostic information is  necessary to determine patient infection status.  Positive results do  not rule out bacterial infection or co-infection with other viruses. If result is PRESUMPTIVE POSTIVE SARS-CoV-2 nucleic acids MAY BE PRESENT.   A presumptive positive  result was obtained on the submitted specimen  and confirmed on repeat testing.  While 2019 novel coronavirus  (SARS-CoV-2) nucleic acids may be present in the submitted sample  additional confirmatory testing may be necessary for epidemiological  and / or clinical management purposes  to differentiate between  SARS-CoV-2 and other Sarbecovirus currently known to infect humans.  If clinically indicated additional testing with an alternate test  methodology  [phone]) is advised. The SARS-CoV-2 RNA is generally  detectable in upper and lower respiratory sp ecimens during the acute  phase of infection. The expected result is Negative. Fact Sheet for Patients:  StrictlyIdeas.no Fact Sheet for Healthcare Providers: BankingDealers.co.za This test is not yet approved or cleared by the [place] FDA and has been authorized for detection and/or diagnosis of SARS-CoV-2 by FDA under an Emergency Use Authorization (EUA).  This EUA will remain in effect (meaning this test can be used) for the duration of the COVID-19 declaration under Section 564(b)(1) of the Act, 21 U.S.C. section 360bbb-3(b)(1), unless the authorization is terminated or revoked sooner. Performed at [facility], [place] [address], [place], [place] [address]   Lactic acid, plasma     Status: None   Collection Time: 03/12/19  6:25 PM  Result Value Ref Range   Lactic Acid, Venous 1.6 0.5 - 1.9 mmol/L    Comment: Performed at [facility] [address] [place], [place] [address]  Magnesium     Status: None   Collection Time: 03/12/19  6:25 PM  Result Value Ref Range   Magnesium 2.2 1.7 - 2.4 mg/dL    Comment: Performed at [facility], [place] [address] [place], [place] [address]  Urinalysis, Routine w reflex microscopic     Status: Abnormal   Collection Time: 03/12/19  6:43 PM  Result Value Ref Range   Color, Urine YELLOW YELLOW   APPearance CLEAR CLEAR   Specific Gravity, Urine 1.017 1.005 - 1.030   pH 5.0 5.0 - 8.0   Glucose, UA NEGATIVE NEGATIVE mg/dL   Hgb urine dipstick MODERATE (A) NEGATIVE   Bilirubin Urine NEGATIVE NEGATIVE   Ketones, ur NEGATIVE NEGATIVE mg/dL   Protein, ur 30 (A) NEGATIVE mg/dL   Nitrite NEGATIVE NEGATIVE   Leukocytes,Ua SMALL (A) NEGATIVE   RBC / HPF 0-5 0 - 5 RBC/hpf   WBC, UA 0-5 0 - 5 WBC/hpf   Bacteria, UA RARE (A) NONE SEEN   Squamous Epithelial / LPF 0-5 0 - 5   Mucus  PRESENT    Hyaline Casts, UA PRESENT     Comment: Performed at [facility], [address], [place], [place] [address]  TSH     Status: Abnormal   Collection Time: 03/12/19 10:32 PM  Result Value Ref Range   TSH 7.938 (H) 0.350 - 4.500 uIU/mL    Comment: Performed by a 3rd Generation assay with a functional sensitivity of <=0.01 uIU/mL. Performed at [facility], [place] [address] [place], [place] [address]    Ct Angio Chest Pe W And/or Wo Contrast  Result Date: 03/12/2019 CLINICAL DATA:  Chest heaviness and diaphoresis EXAM: CT ANGIOGRAPHY CHEST WITH CONTRAST TECHNIQUE: Multidetector CT imaging of the chest was performed using the standard protocol during bolus administration of intravenous contrast. Multiplanar CT image reconstructions and MIPs were obtained to evaluate the vascular anatomy. CONTRAST:  36mL OMNIPAQUE IOHEXOL 350 MG/ML SOLN COMPARISON:  03/12/2019 radiograph FINDINGS: Cardiovascular: Satisfactory opacification of the pulmonary arteries to the segmental  level. No evidence of pulmonary embolism. Nonaneurysmal aorta. Mild aortic atherosclerosis. Coronary vascular calcification. Mild cardiomegaly. No pericardial effusion. Mediastinum/Nodes: Midline trachea. No thyroid mass. No significant adenopathy. Small distal esophageal hiatal hernia Lungs/Pleura: Lungs are clear. No pleural effusion or pneumothorax. Upper Abdomen: No acute abnormality. Musculoskeletal: Degenerative changes. Incompletely visualized superior endplate fracture at W11. Review of the MIP images confirms the above findings. IMPRESSION: 1. Negative for acute pulmonary embolus. 2. Clear lung fields Aortic Atherosclerosis (ICD10-I70.0). Electronically Signed   By: Donavan Foil M.D.   On: 03/12/2019 21:46   Dg Chest Portable 1 View  Result Date: 03/12/2019 CLINICAL DATA:  Chest discomfort EXAM: PORTABLE CHEST 1 VIEW COMPARISON:  September 13, 2016 FINDINGS: There is no edema or consolidation. Heart is mildly  enlarged with pulmonary vascularity normal. Soft tissue prominence right paratracheal region is stable and felt to represent great vessel prominence. No adenopathy. No pneumothorax. No bone lesions. IMPRESSION: No edema or consolidation. Stable cardiac silhouette. No evident adenopathy. Electronically Signed   By: Lowella Grip III M.D.   On: 03/12/2019 18:25    Pending Labs Unresulted Labs (From admission, onward)    Start     Ordered   03/13/19 0500  Comprehensive metabolic panel  Tomorrow morning,   R     03/13/19 0028   03/13/19 0500  CBC  Tomorrow morning,   R     03/13/19 0028   03/12/19 1809  Urine culture  ONCE - STAT,   STAT     03/12/19 1808   03/12/19 1808  Culture, blood (routine x 2)  BLOOD CULTURE X 2,   STAT     03/12/19 1807          Vitals/Pain Today's Vitals   03/12/19 2300 03/12/19 2331 03/12/19 2336 03/13/19 0014  BP: (!) 155/113 (!) 179/149    Pulse: (!) 55     Resp:      Temp:      TempSrc:      SpO2: 97%     PainSc:   5  5     Isolation Precautions Airborne and Contact precautions  Medications Medications  metoprolol succinate (TOPROL-XL) 24 hr tablet 50 mg (50 mg Oral Given 03/12/19 1856)  sodium chloride flush (NS) 0.9 % injection 3 mL (has no administration in time range)  sodium chloride flush (NS) 0.9 % injection 3 mL (has no administration in time range)  0.9 %  sodium chloride infusion (has no administration in time range)  acetaminophen (TYLENOL) tablet 650 mg (has no administration in time range)    Or  acetaminophen (TYLENOL) suppository 650 mg (has no administration in time range)  insulin aspart (novoLOG) injection 0-9 Units (has no administration in time range)  apixaban (ELIQUIS) tablet 5 mg (has no administration in time range)  sodium chloride flush (NS) 0.9 % injection 3 mL (3 mLs Intravenous Given 03/12/19 1857)  sodium chloride 0.9 % bolus 1,000 mL (0 mLs Intravenous Stopped 03/12/19 1930)  acetaminophen (TYLENOL) tablet 650 mg  (650 mg Oral Given 03/12/19 1856)  iohexol (OMNIPAQUE) 350 MG/ML injection 75 mL (75 mLs Intravenous Contrast Given 03/12/19 2114)    Mobility walks with device Low fall risk   Focused Assessments Cardiac Assessment Handoff:  Cardiac Rhythm: Atrial fibrillation No results found for: CKTOTAL, CKMB, CKMBINDEX, TROPONINI No results found for: DDIMER Does the Patient currently have chest pain? No      R Recommendations: See Admitting Provider Note  Report given to:   Additional Notes:

## 2019-03-13 NOTE — ED Notes (Signed)
Lunch Tray Ordered @ 1101-per Lexine Baton, RN called by Levada Dy

## 2019-03-14 ENCOUNTER — Encounter (HOSPITAL_COMMUNITY): Payer: Self-pay | Admitting: Interventional Cardiology

## 2019-03-14 ENCOUNTER — Encounter (HOSPITAL_COMMUNITY)
Admission: EM | Disposition: A | Discharge status: Home / Self Care | Payer: Self-pay | Source: Home / Self Care | Attending: Internal Medicine

## 2019-03-14 DIAGNOSIS — I2510 Atherosclerotic heart disease of native coronary artery without angina pectoris: Secondary | ICD-10-CM

## 2019-03-14 HISTORY — PX: LEFT HEART CATH AND CORONARY ANGIOGRAPHY: CATH118249

## 2019-03-14 LAB — APTT: aPTT: 134 seconds — ABNORMAL HIGH (ref 24–36)

## 2019-03-14 LAB — BASIC METABOLIC PANEL
Anion gap: 12 (ref 5–15)
BUN: 14 mg/dL (ref 8–23)
CO2: 19 mmol/L — ABNORMAL LOW (ref 22–32)
Calcium: 9.2 mg/dL (ref 8.9–10.3)
Chloride: 109 mmol/L (ref 98–111)
Creatinine, Ser: 0.96 mg/dL (ref 0.44–1.00)
GFR calc Af Amer: >60 mL/min (ref >60)
GFR calc non Af Amer: 55 mL/min — ABNORMAL LOW (ref >60)
Glucose, Bld: 98 mg/dL (ref 70–99)
Potassium: 4.7 mmol/L (ref 3.5–5.1)
Sodium: 140 mmol/L (ref 135–145)

## 2019-03-14 LAB — GLUCOSE, CAPILLARY
Glucose-Capillary: 79 mg/dL (ref 70–99)
Glucose-Capillary: 83 mg/dL (ref 70–99)
Glucose-Capillary: 83 mg/dL (ref 70–99)
Glucose-Capillary: 86 mg/dL (ref 70–99)
Glucose-Capillary: 87 mg/dL (ref 70–99)
Glucose-Capillary: 93 mg/dL (ref 70–99)

## 2019-03-14 LAB — CBC
HCT: 46 % (ref 36.0–46.0)
Hemoglobin: 15.6 g/dL — ABNORMAL HIGH (ref 12.0–15.0)
MCH: 31.7 pg (ref 26.0–34.0)
MCHC: 33.9 g/dL (ref 30.0–36.0)
MCV: 93.5 fL (ref 80.0–100.0)
Platelets: 172 10*3/uL (ref 150–400)
RBC: 4.92 MIL/uL (ref 3.87–5.11)
RDW: 13.8 % (ref 11.5–15.5)
WBC: 7.9 10*3/uL (ref 4.0–10.5)
nRBC: 0 % (ref 0.0–0.2)

## 2019-03-14 LAB — HEPARIN LEVEL (UNFRACTIONATED): Heparin Unfractionated: 1.14 IU/mL — ABNORMAL HIGH (ref 0.30–0.70)

## 2019-03-14 LAB — TROPONIN I (HIGH SENSITIVITY): Troponin I (High Sensitivity): 3 ng/L (ref <18)

## 2019-03-14 SURGERY — LEFT HEART CATH AND CORONARY ANGIOGRAPHY
Anesthesia: LOCAL

## 2019-03-14 MED ORDER — HEPARIN SODIUM (PORCINE) 1000 UNIT/ML IJ SOLN
INTRAMUSCULAR | Status: DC | PRN
  Administered 2019-03-14: 4000 [IU] via INTRAVENOUS

## 2019-03-14 MED ORDER — LABETALOL HCL 5 MG/ML IV SOLN: 10.0000 mg | INTRAVENOUS | Status: AC | PRN

## 2019-03-14 MED ORDER — ASPIRIN 81 MG PO CHEW
81.0000 mg | CHEWABLE_TABLET | ORAL | Status: AC
  Administered 2019-03-14: 81 mg via ORAL
  Filled 2019-03-14: qty 1

## 2019-03-14 MED ORDER — SODIUM CHLORIDE 0.9 % IV SOLN: 250.0000 mL | INTRAVENOUS | Status: DC

## 2019-03-14 MED ORDER — SODIUM CHLORIDE 0.9% FLUSH
3.0000 mL | Freq: Two times a day (BID) | INTRAVENOUS | Status: DC
  Administered 2019-03-15: 3 mL via INTRAVENOUS

## 2019-03-14 MED ORDER — SODIUM CHLORIDE 0.9% FLUSH: 3.0000 mL | INTRAVENOUS | Status: DC | PRN

## 2019-03-14 MED ORDER — HEPARIN SODIUM (PORCINE) 1000 UNIT/ML IJ SOLN
INTRAMUSCULAR | Status: AC
  Filled 2019-03-14: qty 1

## 2019-03-14 MED ORDER — SODIUM CHLORIDE 0.9 % IV SOLN: INTRAVENOUS | Status: AC

## 2019-03-14 MED ORDER — FENTANYL CITRATE (PF) 100 MCG/2ML IJ SOLN
INTRAMUSCULAR | Status: AC
  Filled 2019-03-14: qty 2

## 2019-03-14 MED ORDER — SODIUM CHLORIDE 0.9% FLUSH
3.0000 mL | Freq: Two times a day (BID) | INTRAVENOUS | Status: DC
  Administered 2019-03-15 – 2019-03-16 (×2): 3 mL via INTRAVENOUS

## 2019-03-14 MED ORDER — OXYCODONE HCL 5 MG PO TABS: 5.0000 mg | ORAL_TABLET | ORAL | Status: DC | PRN

## 2019-03-14 MED ORDER — LIDOCAINE HCL (PF) 1 % IJ SOLN
INTRAMUSCULAR | Status: DC | PRN
  Administered 2019-03-14: 2 mL

## 2019-03-14 MED ORDER — HEPARIN (PORCINE) IN NACL 1000-0.9 UT/500ML-% IV SOLN
INTRAVENOUS | Status: AC
  Filled 2019-03-14: qty 1000

## 2019-03-14 MED ORDER — SODIUM CHLORIDE 0.9 % IV SOLN: 250.0000 mL | INTRAVENOUS | Status: DC | PRN

## 2019-03-14 MED ORDER — HYDRALAZINE HCL 20 MG/ML IJ SOLN: 10.0000 mg | INTRAMUSCULAR | Status: AC | PRN

## 2019-03-14 MED ORDER — SODIUM CHLORIDE 0.9 % WEIGHT BASED INFUSION: 1.0000 mL/kg/h | INTRAVENOUS | Status: DC

## 2019-03-14 MED ORDER — APIXABAN 5 MG PO TABS
5.0000 mg | ORAL_TABLET | Freq: Two times a day (BID) | ORAL | Status: DC
  Administered 2019-03-14 – 2019-03-16 (×4): 5 mg via ORAL
  Filled 2019-03-14 (×4): qty 1

## 2019-03-14 MED ORDER — LIDOCAINE HCL (PF) 1 % IJ SOLN
INTRAMUSCULAR | Status: AC
  Filled 2019-03-14: qty 30

## 2019-03-14 MED ORDER — IOHEXOL 350 MG/ML SOLN
INTRAVENOUS | Status: DC | PRN
  Administered 2019-03-14: 65 mL via INTRA_ARTERIAL

## 2019-03-14 MED ORDER — ONDANSETRON HCL 4 MG/2ML IJ SOLN: 4.0000 mg | Freq: Four times a day (QID) | INTRAMUSCULAR | Status: DC | PRN

## 2019-03-14 MED ORDER — SODIUM CHLORIDE 0.9 % WEIGHT BASED INFUSION
3.0000 mL/kg/h | INTRAVENOUS | Status: DC
  Administered 2019-03-14: 3 mL/kg/h via INTRAVENOUS

## 2019-03-14 MED ORDER — MIDAZOLAM HCL 2 MG/2ML IJ SOLN
INTRAMUSCULAR | Status: AC
  Filled 2019-03-14: qty 2

## 2019-03-14 MED ORDER — VERAPAMIL HCL 2.5 MG/ML IV SOLN
INTRAVENOUS | Status: DC | PRN
  Administered 2019-03-14: 10 mL via INTRA_ARTERIAL

## 2019-03-14 MED ORDER — HEPARIN (PORCINE) IN NACL 1000-0.9 UT/500ML-% IV SOLN
INTRAVENOUS | Status: DC | PRN
  Administered 2019-03-14 (×2): 500 mL

## 2019-03-14 MED ORDER — MIDAZOLAM HCL 2 MG/2ML IJ SOLN
INTRAMUSCULAR | Status: DC | PRN
  Administered 2019-03-14: 1 mg via INTRAVENOUS

## 2019-03-14 MED ORDER — FENTANYL CITRATE (PF) 100 MCG/2ML IJ SOLN
INTRAMUSCULAR | Status: DC | PRN
  Administered 2019-03-14: 25 ug via INTRAVENOUS

## 2019-03-14 MED ORDER — HEPARIN (PORCINE) 25000 UT/250ML-% IV SOLN: 1050.0000 [IU]/h | INTRAVENOUS | Status: DC

## 2019-03-14 MED ORDER — DILTIAZEM HCL 30 MG PO TABS
45.0000 mg | ORAL_TABLET | Freq: Four times a day (QID) | ORAL | Status: DC
  Administered 2019-03-14 – 2019-03-15 (×3): 45 mg via ORAL
  Filled 2019-03-14 (×3): qty 2

## 2019-03-14 MED ORDER — ACETAMINOPHEN 325 MG PO TABS: 650.0000 mg | ORAL_TABLET | ORAL | Status: DC | PRN

## 2019-03-14 MED ORDER — VERAPAMIL HCL 2.5 MG/ML IV SOLN
INTRAVENOUS | Status: AC
  Filled 2019-03-14: qty 2

## 2019-03-14 SURGICAL SUPPLY — 11 items
CATH INFINITI 5FR MULTPACK ANG (CATHETERS) ×1 IMPLANT
DEVICE RAD COMP TR BAND LRG (VASCULAR PRODUCTS) ×1 IMPLANT
GUIDEWIRE INQWIRE 1.5J.035X260 (WIRE) IMPLANT
INQWIRE 1.5J .035X260CM (WIRE) ×2
KIT HEART LEFT (KITS) ×2 IMPLANT
PACK CARDIAC CATHETERIZATION (CUSTOM PROCEDURE TRAY) ×2 IMPLANT
SHEATH PROBE COVER 6X72 (BAG) ×1 IMPLANT
SHEATH RAIN RADIAL 21G 6FR (SHEATH) ×1 IMPLANT
TRANSDUCER W/STOPCOCK (MISCELLANEOUS) ×2 IMPLANT
TUBING CIL FLEX 10 FLL-RA (TUBING) ×2 IMPLANT
WIRE HI TORQ VERSACORE-J 145CM (WIRE) ×1 IMPLANT

## 2019-03-14 NOTE — Interval H&P Note (Signed)
Cath Lab Visit (complete for each Cath Lab visit)  Clinical Evaluation Leading to the Procedure:   ACS: No.  Non-ACS:    Anginal Classification: CCS III  Anti-ischemic medical therapy: Minimal Therapy (1 class of medications)  Non-Invasive Test Results: No non-invasive testing performed  Prior CABG: No previous CABG      History and Physical Interval Note:  03/14/2019 9:54 AM  [name] [name]  has presented today for surgery, with the diagnosis of chest pain.  The various methods of treatment have been discussed with the patient and family. After consideration of risks, benefits and other options for treatment, the patient has consented to  Procedure(s): LEFT HEART CATH AND CORONARY ANGIOGRAPHY (N/A) as a surgical intervention.  The patient's history has been reviewed, patient examined, no change in status, stable for surgery.  I have reviewed the patient's chart and labs.  Questions were answered to the patient's satisfaction.     Belva Crome III

## 2019-03-14 NOTE — CV Procedure (Signed)
   Left heart cath, coronary angiography, left ventriculography via left radial using real-time vascular ultrasound guidance.  Normal left main  Segmental 40 to 50% eccentric proximal LAD, widely patent mid LAD stent, 60 to 70% LAD diagonal #2 bifurcation stenosis in worse views and 50% in most views.  Normal circumflex  Normal RCA for age  LVEF 45 to 55%.  Normal LVEDP

## 2019-03-14 NOTE — Progress Notes (Signed)
PROGRESS NOTE   [name] [name]  YQI:[id number]    DOB: 1934/02/07    DOA: 03/12/2019  PCP: [facility] [place]   I have briefly reviewed patients previous medical records in [facility].  Chief Complaint  Patient presents with  . Chest Pain    Brief Narrative:  85 year old female with PMH of CAD, DES to LAD in 2006, obesity, DM 2, mild sleep apnea, recent diagnosis of PAF on Eliquis, presented to ED due to [place] since 3 days PTA along with chest pressure.  In ED noted to be in A. fib with RVR.  CTA chest negative for PE, EKG with RBBB and negative high-sensitivity troponin.   Assessment & Plan:   Principal Problem:   Atrial fibrillation with RVR (HCC) Active Problems:   Hypothyroidism   Hyperlipidemia   Essential hypertension   CAD, NATIVE VESSEL   Chronic diastolic HF (heart failure) (HCC)   Fever   A. fib with RVR  chads2vasc score >3 and on Eliquis PTA.  Switch to IV heparin on admission for possible procedures.  Heart rate uncontrolled on Toprol-XL 50 mg daily and Cardizem drip was added.  Rate controlled this morning prior to cath.  Cardiology input appreciated and plan to change to short-acting Cardizem orally post-cath.  Cardiology plans to resume Eliquis tonight and DC cardioversion in a.m.  Chest pressure/CAD s/p DES to LAD in 2006  Cardiology consulted  Patent stent by cath in 2010.  Aspirin and beta-blockers continued.?  DC aspirin while on Eliquis.  Defer to cardiology.  Underwent cardiac cath 7/30.  No high-grade obstructive disease.  Chest pressure felt to be due to A. fib with RVR.  Acute on chronic diastolic CHF  Lasix held by PCP.  Improved after Lasix IV x1 and clinically euvolemic.  Lasix held prior to cath.  Hypokalemia  Replaced  Essential hypertension  Mildly uncontrolled.  Hypothyroid  TSH high suggesting hypothyroid and Synthroid was increased from 88 mcg to 100 mcg.  Recommend repeating TSH in 4 to 6 weeks.  Low-grade  fever  No clinical concern for infectious etiology.  Obesity Estimated body mass index is 32.12 kg/m as calculated from the following:   Height as of this encounter: 5\' 3"  (1.6 m).   Weight as of this encounter: 82.2 kg.    DVT prophylaxis: IV heparin Code Status: Full Family Communication: None at bedside Disposition: To home pending clinical improvement and cardiac clearance,?  8/1   Consultants:  Cardiology  Procedures:  Cardiac cath 7/30  Antimicrobials:  None   Subjective: Patient seen this morning prior to procedure.  Dyspnea improved.  Breathing not yet at baseline.  No chest pain, palpitations, dizziness or lightheadedness.  Reportedly lives with daughter and ambulates with the help of a cane.  Objective:  Vitals:   03/14/19 1302 03/14/19 1332 03/14/19 1400 03/14/19 1624  BP: (!) 142/100 (!) 149/88 (!) 142/90 (!) 145/83  Pulse: 70 68 77 81  Resp:      Temp:      TempSrc:      SpO2: 97% 97% 94% 98%  Weight:      Height:        Examination:  General exam: Pleasant elderly female, moderately built and obese lying comfortably propped up in bed. Respiratory system: Clear to auscultation. Respiratory effort normal. Cardiovascular system: S1 & S2 heard, irregularly irregular. No JVD, murmurs, rubs, gallops or clicks. No pedal edema.  Telemetry personally reviewed: A. fib with controlled ventricular rate.  BBB morphology.  Gastrointestinal system: Abdomen is nondistended, soft and nontender. No organomegaly or masses felt. Normal bowel sounds heard. Central nervous system: Alert and oriented. No focal neurological deficits. Extremities: Symmetric 5 x 5 power. Skin: No rashes, lesions or ulcers Psychiatry: Judgement and insight appear normal. Mood & affect appropriate.     Data Reviewed: I have personally reviewed following labs and imaging studies  CBC: Recent Labs  Lab 03/12/19 1614 03/13/19 0237 03/14/19 0453  WBC 7.3 8.2 7.9  HGB 16.8* 16.3* 15.6*   HCT 50.1* 47.8* 46.0  MCV 92.9 90.9 93.5  PLT 274 258 161   Basic Metabolic Panel: Recent Labs  Lab 03/12/19 1614 03/12/19 1825 03/13/19 0237 03/14/19 0453  NA 139  --  137 140  K 3.9  --  3.2* 4.7  CL 101  --  104 109  CO2 24  --  23 19*  GLUCOSE 94  --  117* 98  BUN 12  --  9 14  CREATININE 0.88  --  0.90 0.96  CALCIUM 10.2  --  9.5 9.2  MG  --  2.2  --   --    Liver Function Tests: Recent Labs  Lab 03/13/19 0237  AST 21  ALT 21  ALKPHOS 69  BILITOT 0.9  PROT 7.2  ALBUMIN 4.0    Cardiac Enzymes: No results for input(s): CKTOTAL, CKMB, CKMBINDEX, TROPONINI in the last 168 hours.  CBG: Recent Labs  Lab 03/14/19 0037 03/14/19 0431 03/14/19 0744 03/14/19 1120 03/14/19 1617  GLUCAP 87 83 93 83 79    Recent Results (from the past 240 hour(s))  Urine culture     Status: Abnormal   Collection Time: 03/12/19  6:15 PM   Specimen: Urine, Clean Catch  Result Value Ref Range Status   Specimen Description URINE, CLEAN CATCH  Final   Special Requests   Final    NONE Performed at [facility], [place] [address], [place],  [address]    Culture MULTIPLE SPECIES PRESENT, SUGGEST RECOLLECTION (A)  Final   Report Status 03/13/2019 FINAL  Final  Culture, blood (routine x 2)     Status: None (Preliminary result)   Collection Time: 03/12/19  6:24 PM   Specimen: BLOOD  Result Value Ref Range Status   Specimen Description BLOOD LEFT ANTECUBITAL  Final   Special Requests   Final    BOTTLES DRAWN AEROBIC AND ANAEROBIC Blood Culture adequate volume   Culture   Final    NO GROWTH 2 DAYS Performed at [facility], [place] [address] [place],  [address]    Report Status PENDING  Incomplete  SARS Coronavirus 2 (CEPHEID- Performed in [place] hospital lab), Hosp Order     Status: None   Collection Time: 03/12/19  6:25 PM   Specimen: Nasopharyngeal Swab  Result Value Ref Range Status   SARS Coronavirus 2 NEGATIVE NEGATIVE Final    Comment: (NOTE) If  result is NEGATIVE SARS-CoV-2 target nucleic acids are NOT DETECTED. The SARS-CoV-2 RNA is generally detectable in upper and lower  respiratory specimens during the acute phase of infection. The lowest  concentration of SARS-CoV-2 viral copies this assay can detect is 250  copies / mL. A negative result does not preclude SARS-CoV-2 infection  and should not be used as the sole basis for treatment or other  patient management decisions.  A negative result may occur with  improper specimen collection / handling, submission of specimen other  than nasopharyngeal swab, presence of  viral mutation(s) within the  areas targeted by this assay, and inadequate number of viral copies  (<250 copies / mL). A negative result must be combined with clinical  observations, patient history, and epidemiological information. If result is POSITIVE SARS-CoV-2 target nucleic acids are DETECTED. The SARS-CoV-2 RNA is generally detectable in upper and lower  respiratory specimens dur ing the acute phase of infection.  Positive  results are indicative of active infection with SARS-CoV-2.  Clinical  correlation with patient history and other diagnostic information is  necessary to determine patient infection status.  Positive results do  not rule out bacterial infection or co-infection with other viruses. If result is PRESUMPTIVE POSTIVE SARS-CoV-2 nucleic acids MAY BE PRESENT.   A presumptive positive result was obtained on the submitted specimen  and confirmed on repeat testing.  While 2019 novel coronavirus  (SARS-CoV-2) nucleic acids may be present in the submitted sample  additional confirmatory testing may be necessary for epidemiological  and / or clinical management purposes  to differentiate between  SARS-CoV-2 and other Sarbecovirus currently known to infect humans.  If clinically indicated additional testing with an alternate test  methodology [phone]) is advised. The SARS-CoV-2 RNA is generally   detectable in upper and lower respiratory sp ecimens during the acute  phase of infection. The expected result is Negative. Fact Sheet for Patients:  StrictlyIdeas.no Fact Sheet for Healthcare Providers: BankingDealers.co.za This test is not yet approved or cleared by the [place] FDA and has been authorized for detection and/or diagnosis of SARS-CoV-2 by FDA under an Emergency Use Authorization (EUA).  This EUA will remain in effect (meaning this test can be used) for the duration of the COVID-19 declaration under Section 564(b)(1) of the Act, 21 U.S.C. section 360bbb-3(b)(1), unless the authorization is terminated or revoked sooner. Performed at [facility], [place] [address] [place], [place] [address]   Culture, blood (routine x 2)     Status: None (Preliminary result)   Collection Time: 03/12/19 10:32 PM   Specimen: BLOOD  Result Value Ref Range Status   Specimen Description BLOOD LEFT RADIAL  Final   Special Requests   Final    BOTTLES DRAWN AEROBIC AND ANAEROBIC Blood Culture results may not be optimal due to an inadequate volume of blood received in culture bottles   Culture   Final    NO GROWTH 2 DAYS Performed at [facility], [place] [address] [place], [place] [address]    Report Status PENDING  Incomplete         Radiology Studies: Ct Angio Chest Pe W And/or Wo Contrast  Result Date: 03/12/2019 CLINICAL DATA:  Chest heaviness and diaphoresis EXAM: CT ANGIOGRAPHY CHEST WITH CONTRAST TECHNIQUE: Multidetector CT imaging of the chest was performed using the standard protocol during bolus administration of intravenous contrast. Multiplanar CT image reconstructions and MIPs were obtained to evaluate the vascular anatomy. CONTRAST:  64mL OMNIPAQUE IOHEXOL 350 MG/ML SOLN COMPARISON:  03/12/2019 radiograph FINDINGS: Cardiovascular: Satisfactory opacification of the pulmonary arteries to the segmental level. No  evidence of pulmonary embolism. Nonaneurysmal aorta. Mild aortic atherosclerosis. Coronary vascular calcification. Mild cardiomegaly. No pericardial effusion. Mediastinum/Nodes: Midline trachea. No thyroid mass. No significant adenopathy. Small distal esophageal hiatal hernia Lungs/Pleura: Lungs are clear. No pleural effusion or pneumothorax. Upper Abdomen: No acute abnormality. Musculoskeletal: Degenerative changes. Incompletely visualized superior endplate fracture at Y78. Review of the MIP images confirms the above findings. IMPRESSION: 1. Negative for acute pulmonary embolus. 2. Clear lung fields Aortic Atherosclerosis (  ICD10-I70.0). Electronically Signed   By: Donavan Foil M.D.   On: 03/12/2019 21:46   Dg Chest Portable 1 View  Result Date: 03/12/2019 CLINICAL DATA:  Chest discomfort EXAM: PORTABLE CHEST 1 VIEW COMPARISON:  September 13, 2016 FINDINGS: There is no edema or consolidation. Heart is mildly enlarged with pulmonary vascularity normal. Soft tissue prominence right paratracheal region is stable and felt to represent great vessel prominence. No adenopathy. No pneumothorax. No bone lesions. IMPRESSION: No edema or consolidation. Stable cardiac silhouette. No evident adenopathy. Electronically Signed   By: Lowella Grip III M.D.   On: 03/12/2019 18:25        Scheduled Meds: . apixaban  5 mg Oral BID  . aspirin EC  81 mg Oral Daily  . diltiazem  45 mg Oral Q6H  . furosemide  20 mg Intravenous Daily  . insulin aspart  0-9 Units Subcutaneous Q4H  . isosorbide mononitrate  120 mg Oral Daily  . levothyroxine  100 mcg Oral Q breakfast  . metoprolol succinate  50 mg Oral Daily  . potassium chloride  40 mEq Oral Daily  . sodium chloride flush  3 mL Intravenous Q12H  . sodium chloride flush  3 mL Intravenous Q12H  . sodium chloride flush  3 mL Intravenous Q12H  . sodium chloride flush  3 mL Intravenous Q12H  . traZODone  225 mg Oral QHS  . venlafaxine XR  150 mg Oral BID    Continuous Infusions: . sodium chloride    . sodium chloride       LOS: 1 day     Vernell Leep, MD, FACP, [facility]. [facility]  To contact the attending provider between 7A-7P or the covering provider during after hours 7P-7A, please log into the web site [email or web address] and access using universal [place] password for that web site. If you do not have the password, please call the hospital operator.  03/14/2019, 5:39 PM

## 2019-03-14 NOTE — Progress Notes (Addendum)
Progress Note  Patient Name: [name] [name] Date of Encounter: 03/14/2019  Primary Cardiologist: [name], MD   Subjective   Slept better. No recurrent chest pain. HR improved.   Inpatient Medications    Scheduled Meds:  aspirin EC  81 mg Oral Daily   furosemide  20 mg Intravenous Daily   insulin aspart  0-9 Units Subcutaneous Q4H   isosorbide mononitrate  120 mg Oral Daily   levothyroxine  100 mcg Oral Q breakfast   metoprolol succinate  50 mg Oral Daily   potassium chloride  40 mEq Oral Daily   sodium chloride flush  3 mL Intravenous Q12H   sodium chloride flush  3 mL Intravenous Q12H   traZODone  225 mg Oral QHS   venlafaxine XR  150 mg Oral BID   Continuous Infusions:  sodium chloride     sodium chloride     sodium chloride 1 mL/kg/hr (03/14/19 0720)   diltiazem (CARDIZEM) infusion 5 mg/hr (03/14/19 0555)   heparin 1,050 Units/hr (03/14/19 0849)   PRN Meds: sodium chloride, sodium chloride, acetaminophen **OR** acetaminophen, sodium chloride flush, sodium chloride flush   Vital Signs    Vitals:   03/13/19 1949 03/14/19 0029 03/14/19 0435 03/14/19 0747  BP: 107/68 132/81 (!) 149/121 (!) 131/94  Pulse: 82 77 69 (!) 127  Resp: 17 17 17 15   Temp: 98.3 F (36.8 C) 97.7 F (36.5 C) 97.8 F (36.6 C) 97.8 F (36.6 C)  TempSrc: Oral Oral Oral Oral  SpO2: 96% 97% 98% 98%  Weight:   82.2 kg   Height:        Intake/Output Summary (Last 24 hours) at 03/14/2019 0947 Last data filed at 03/14/2019 0611 Gross per 24 hour  Intake 796.86 ml  Output 300 ml  Net 496.86 ml   Last 3 Weights 03/14/2019 03/13/2019 03/13/2019  Weight (lbs) 181 lb 4.8 oz 179 lb 14.3 oz 180 lb  Weight (kg) 82.237 kg 81.6 kg 81.647 kg      Telemetry    Atrial fibrillation at rate of 60s- Personally Reviewed  ECG    No new tracing  Physical Exam   GEN: No acute distress.   Neck: No JVD Cardiac: RRR, no murmurs, rubs, or gallops.  Respiratory: Clear to  auscultation bilaterally. GI: Soft, nontender, non-distended  MS: No edema; No deformity. Neuro:  Nonfocal  Psych: Normal affect   Labs    High Sensitivity Troponin:   Recent Labs  Lab 03/12/19 1614 03/12/19 1825 03/14/19 0453  TROPONINIHS 4 4 3       Chemistry Recent Labs  Lab 03/12/19 1614 03/13/19 0237 03/14/19 0453  NA 139 137 140  K 3.9 3.2* 4.7  CL 101 104 109  CO2 24 23 19*  GLUCOSE 94 117* 98  BUN 12 9 14   CREATININE 0.88 0.90 0.96  CALCIUM 10.2 9.5 9.2  PROT  --  7.2  --   ALBUMIN  --  4.0  --   AST  --  21  --   ALT  --  21  --   ALKPHOS  --  69  --   BILITOT  --  0.9  --   GFRNONAA >60 59* 55*  GFRAA >60 >60 >60  ANIONGAP 14 10 12      Hematology Recent Labs  Lab 03/12/19 1614 03/13/19 0237 03/14/19 0453  WBC 7.3 8.2 7.9  RBC 5.39* 5.26* 4.92  HGB 16.8* 16.3* 15.6*  HCT 50.1* 47.8* 46.0  MCV 92.9 90.9  93.5  MCH 31.2 31.0 31.7  MCHC 33.5 34.1 33.9  RDW 13.3 13.3 13.8  PLT 274 258 172    BNP Recent Labs  Lab 03/13/19 1018  BNP 635.2*     Radiology    Ct Angio Chest Pe W And/or Wo Contrast  Result Date: 03/12/2019 CLINICAL DATA:  Chest heaviness and diaphoresis EXAM: CT ANGIOGRAPHY CHEST WITH CONTRAST TECHNIQUE: Multidetector CT imaging of the chest was performed using the standard protocol during bolus administration of intravenous contrast. Multiplanar CT image reconstructions and MIPs were obtained to evaluate the vascular anatomy. CONTRAST:  6mL OMNIPAQUE IOHEXOL 350 MG/ML SOLN COMPARISON:  03/12/2019 radiograph FINDINGS: Cardiovascular: Satisfactory opacification of the pulmonary arteries to the segmental level. No evidence of pulmonary embolism. Nonaneurysmal aorta. Mild aortic atherosclerosis. Coronary vascular calcification. Mild cardiomegaly. No pericardial effusion. Mediastinum/Nodes: Midline trachea. No thyroid mass. No significant adenopathy. Small distal esophageal hiatal hernia Lungs/Pleura: Lungs are clear. No pleural  effusion or pneumothorax. Upper Abdomen: No acute abnormality. Musculoskeletal: Degenerative changes. Incompletely visualized superior endplate fracture at P32. Review of the MIP images confirms the above findings. IMPRESSION: 1. Negative for acute pulmonary embolus. 2. Clear lung fields Aortic Atherosclerosis (ICD10-I70.0). Electronically Signed   By: Donavan Foil M.D.   On: 03/12/2019 21:46   Dg Chest Portable 1 View  Result Date: 03/12/2019 CLINICAL DATA:  Chest discomfort EXAM: PORTABLE CHEST 1 VIEW COMPARISON:  September 13, 2016 FINDINGS: There is no edema or consolidation. Heart is mildly enlarged with pulmonary vascularity normal. Soft tissue prominence right paratracheal region is stable and felt to represent great vessel prominence. No adenopathy. No pneumothorax. No bone lesions. IMPRESSION: No edema or consolidation. Stable cardiac silhouette. No evident adenopathy. Electronically Signed   By: Lowella Grip III M.D.   On: 03/12/2019 18:25    Cardiac Studies   Pending cath   Patient Profile     [name] [name] is a 85 y.o. female with a hx of CAD (DES to LAD in 2006), obesity, type 2 diabetes mellitus, mild sleep apnea, Chronic diastolic HF (EF 95-18%, A4ZY 2020), recent diagnosis of paroxysmal atrial fibrillation on Eliquis who is being seen for the evaluation of dyspnea on exertion and chest pressure.  Last Echo 11/11/18 @ [facility] 60 to 65%, normal right ventricular systolic function, mild left ventricular hypertrophy, G2DD (elevated filling pressures), mitral annular calcification, mildly dilated left atrium, aortic sclerosis  Assessment & Plan    1. Chest pressure  - Rate related vs angina. Hs- troponin 3. EKG without ischemic changes. For cath today.   2. CAD s/p DES to LAD in 2006 - Patent stent by cath in 2010 - Pending cath today - Continue ASA and BB.  Not on statin here or at home. Will check lipid panel in AM.   3. AFib RVR - Rate improved to 60s on  IV cardizem at rate of 5cc/hr>> change to short acting post cath.  - Eliquis on hold for cath. Continue heparin.   4. Acute on chronic diastolic CHF - Previously held lasix by PCP.  - BNP 635 - Evuvolemic - Likely rate related.  - Some reason did not got Lasix yesterday and held today for cath - Evaluate for LVEDP  5. Hypokalemia - Resolved  6. HTN - Elevated on presentation. Now improved.    For questions or updates, please contact [place] Please consult [email or web address] for contact info under        SignedLeanor Kail, PA  03/14/2019, 9:47 AM  Patient seen and examined. Agree with assessment and plan. For definitive cardiac cath this am. AF rate controlled on cardizem drip and metorpolol.   Just back from cath: angios  reviewed with Dr. Tamala Julian, No high grade obstructive disease. Suspect diastolic dysfunction and AF RVR contributed to significant dyspnea leading to admission. Therefore, will resume eliquis tonight, and try to arrange for DC cardioversion in am. Will keep NPO in am.   Troy Sine, MD, [facility] 03/14/2019 10:23 AM

## 2019-03-14 NOTE — Progress Notes (Addendum)
ANTICOAGULATION CONSULT NOTE - Follow Up Consult  Pharmacy Consult for Heparin Indication: atrial fibrillation  Allergies  Allergen Reactions  . No Known Allergies     Patient Measurements: Height: 5\' 3"  (160 cm) Weight: 181 lb 4.8 oz (82.2 kg)(scale b) IBW/kg (Calculated) : 52.4 Heparin Dosing Weight: 70.3 kg  Vital Signs: Temp: 97.8 F (36.6 C) (07/30 0747) Temp Source: Oral (07/30 0747) BP: 131/94 (07/30 0747) Pulse Rate: 127 (07/30 0747)  Labs: Recent Labs    03/12/19 1614 03/12/19 1825 03/13/19 0237 03/13/19 1018 03/13/19 2021 03/14/19 0453  HGB 16.8*  --  16.3*  --   --  15.6*  HCT 50.1*  --  47.8*  --   --  46.0  PLT 274  --  258  --   --  172  APTT  --   --   --  38* 93* 134*  HEPARINUNFRC  --   --   --  1.06*  --  1.14*  CREATININE 0.88  --  0.90  --   --  0.96  TROPONINIHS 4 4  --   --   --  3    Estimated Creatinine Clearance: 45.1 mL/min (by C-G formula based on SCr of 0.96 mg/dL).   Medications:  Infusions:  . sodium chloride    . sodium chloride    . sodium chloride 1 mL/kg/hr (03/14/19 0720)  . diltiazem (CARDIZEM) infusion 5 mg/hr (03/14/19 0555)  . heparin 1,200 Units/hr (03/14/19 0140)    Assessment: 89 yoF admitted with SOB on apixaban PTA for Afib. Pharmacy consulted to dose heparin while awaiting cardiology evaulation. Heparin level remains elevated at 1.14 but aPTT is also elevated at 134. No bleeding noted. Planning cardiac cath today.   Goal of Therapy:  Heparin level 0.3-0.7 units/ml  APTT level 66-102 secs Monitor platelets by anticoagulation protocol: Yes   Plan:  Reduce heparin drip to 1050 units/hr  F/u post-cath  Salome Arnt, PharmD, BCPS Please see AMION for all pharmacy numbers 03/14/2019 8:41 AM   Addendum: Heparin to resume 8 hour post-sheath removal. Will resume at rate of 1050 units/hr at 7pm tonight. Will check an 8 hr heparin level and aPTT.  Salome Arnt, PharmD, BCPS Please see AMION for all  pharmacy numbers 03/14/2019 11:24 AM

## 2019-03-15 ENCOUNTER — Inpatient Hospital Stay (HOSPITAL_COMMUNITY): Payer: Medicare Other | Admitting: Certified Registered Nurse Anesthetist

## 2019-03-15 ENCOUNTER — Encounter (HOSPITAL_COMMUNITY)
Admission: EM | Disposition: A | Discharge status: Home / Self Care | Payer: Self-pay | Source: Home / Self Care | Attending: Internal Medicine

## 2019-03-15 HISTORY — PX: CARDIOVERSION: SHX1299

## 2019-03-15 LAB — CBC
HCT: 41.6 % (ref 36.0–46.0)
Hemoglobin: 14.1 g/dL (ref 12.0–15.0)
MCH: 31.6 pg (ref 26.0–34.0)
MCHC: 33.9 g/dL (ref 30.0–36.0)
MCV: 93.3 fL (ref 80.0–100.0)
Platelets: 250 10*3/uL (ref 150–400)
RBC: 4.46 MIL/uL (ref 3.87–5.11)
RDW: 13.5 % (ref 11.5–15.5)
WBC: 6.2 10*3/uL (ref 4.0–10.5)
nRBC: 0 % (ref 0.0–0.2)

## 2019-03-15 LAB — LIPID PANEL
Cholesterol: 204 mg/dL — ABNORMAL HIGH (ref 0–200)
HDL: 51 mg/dL (ref >40)
LDL Cholesterol: 127 mg/dL — ABNORMAL HIGH (ref 0–99)
Total CHOL/HDL Ratio: 4 RATIO
Triglycerides: 128 mg/dL (ref <150)
VLDL: 26 mg/dL (ref 0–40)

## 2019-03-15 LAB — GLUCOSE, CAPILLARY
Glucose-Capillary: 99 mg/dL (ref 70–99)
Glucose-Capillary: 88 mg/dL (ref 70–99)
Glucose-Capillary: 144 mg/dL — ABNORMAL HIGH (ref 70–99)
Glucose-Capillary: 107 mg/dL — ABNORMAL HIGH (ref 70–99)

## 2019-03-15 SURGERY — CARDIOVERSION
Anesthesia: General

## 2019-03-15 MED ORDER — SODIUM CHLORIDE 0.9 % IV SOLN
INTRAVENOUS | Status: DC
  Administered 2019-03-15: 12:00:00 via INTRAVENOUS

## 2019-03-15 MED ORDER — LIDOCAINE 2% (20 MG/ML) 5 ML SYRINGE
INTRAMUSCULAR | Status: DC | PRN
  Administered 2019-03-15: 60 mg via INTRAVENOUS

## 2019-03-15 MED ORDER — HYDROCORTISONE 1 % EX CREA
TOPICAL_CREAM | Freq: Two times a day (BID) | CUTANEOUS | Status: DC
  Administered 2019-03-15 (×2): via TOPICAL
  Filled 2019-03-15: qty 28

## 2019-03-15 MED ORDER — DILTIAZEM HCL ER COATED BEADS 180 MG PO CP24
180.0000 mg | ORAL_CAPSULE | Freq: Every day | ORAL | Status: DC
  Administered 2019-03-15 – 2019-03-16 (×2): 180 mg via ORAL
  Filled 2019-03-15 (×2): qty 1

## 2019-03-15 MED ORDER — PROPOFOL 10 MG/ML IV BOLUS
INTRAVENOUS | Status: DC | PRN
  Administered 2019-03-15: 60 mg via INTRAVENOUS

## 2019-03-15 MED ORDER — ATORVASTATIN CALCIUM 40 MG PO TABS
40.0000 mg | ORAL_TABLET | Freq: Every day | ORAL | Status: DC
  Administered 2019-03-15: 40 mg via ORAL
  Filled 2019-03-15: qty 1

## 2019-03-15 NOTE — Progress Notes (Addendum)
Progress Note  Patient Name: [name] [name] Date of Encounter: 03/15/2019  Primary Cardiologist: [name], MD   Subjective   No chest pain or dyspnea.   Inpatient Medications    Scheduled Meds: . apixaban  5 mg Oral BID  . aspirin EC  81 mg Oral Daily  . diltiazem  45 mg Oral Q6H  . furosemide  20 mg Intravenous Daily  . insulin aspart  0-9 Units Subcutaneous Q4H  . isosorbide mononitrate  120 mg Oral Daily  . levothyroxine  100 mcg Oral Q breakfast  . metoprolol succinate  50 mg Oral Daily  . potassium chloride  40 mEq Oral Daily  . sodium chloride flush  3 mL Intravenous Q12H  . sodium chloride flush  3 mL Intravenous Q12H  . sodium chloride flush  3 mL Intravenous Q12H  . sodium chloride flush  3 mL Intravenous Q12H  . traZODone  225 mg Oral QHS  . venlafaxine XR  150 mg Oral BID   Continuous Infusions: . sodium chloride    . sodium chloride      Vital Signs    Vitals:   03/14/19 2030 03/15/19 0141 03/15/19 0238 03/15/19 0450  BP: 136/77 (!) 122/56  138/84  Pulse: 74 83  80  Resp: 20 20  20   Temp: 98 F (36.7 C) 98.3 F (36.8 C)  (!) 97.4 F (36.3 C)  TempSrc: Oral Oral  Oral  SpO2: 98% 96%  95%  Weight:   82.5 kg   Height:        Intake/Output Summary (Last 24 hours) at 03/15/2019 0800 Last data filed at 03/15/2019 0700 Gross per 24 hour  Intake 480 ml  Output 2100 ml  Net -1620 ml   Last 3 Weights 03/15/2019 03/14/2019 03/13/2019  Weight (lbs) 181 lb 14.4 oz 181 lb 4.8 oz 179 lb 14.3 oz  Weight (kg) 82.509 kg 82.237 kg 81.6 kg      Telemetry    Atrial fibrillation at rate of 60-80s - Personally Reviewed  ECG    N/A  Physical Exam   GEN: No acute distress.   Neck: No JVD Cardiac: Ir IR , no murmurs, rubs, or gallops. L Radial cath site without hematoma or bruise.  Respiratory: Clear to auscultation bilaterally. GI: Soft, nontender, non-distended  MS: No edema; No deformity. Neuro:  Nonfocal  Psych: Normal affect   Labs    High  Sensitivity Troponin:   Recent Labs  Lab 03/12/19 1614 03/12/19 1825 03/14/19 0453  TROPONINIHS 4 4 3       Cardiac EnzymesNo results for input(s): TROPONINI in the last 168 hours. No results for input(s): TROPIPOC in the last 168 hours.   Chemistry Recent Labs  Lab 03/12/19 1614 03/13/19 0237 03/14/19 0453  NA 139 137 140  K 3.9 3.2* 4.7  CL 101 104 109  CO2 24 23 19*  GLUCOSE 94 117* 98  BUN 12 9 14   CREATININE 0.88 0.90 0.96  CALCIUM 10.2 9.5 9.2  PROT  --  7.2  --   ALBUMIN  --  4.0  --   AST  --  21  --   ALT  --  21  --   ALKPHOS  --  69  --   BILITOT  --  0.9  --   GFRNONAA >60 59* 55*  GFRAA >60 >60 >60  ANIONGAP 14 10 12      Hematology Recent Labs  Lab 03/13/19 3875 03/14/19 0453 03/15/19 0545  WBC 8.2 7.9 6.2  RBC 5.26* 4.92 4.46  HGB 16.3* 15.6* 14.1  HCT 47.8* 46.0 41.6  MCV 90.9 93.5 93.3  MCH 31.0 31.7 31.6  MCHC 34.1 33.9 33.9  RDW 13.3 13.8 13.5  PLT 258 172 250    BNP Recent Labs  Lab 03/13/19 1018  BNP 635.2*     Radiology    No results found.  Cardiac Studies   LEFT HEART CATH AND CORONARY ANGIOGRAPHY  Conclusion   Normal left main  Proximal eccentric 30 to 50% LAD stenoses.  Diffuse 20 to 30% in-stent restenosis in mid LAD, and eccentric 40 to 60% [place] 011 bifurcation stenosis with a distal second diagonal.  The apical LAD contains mild to moderate diffuse disease.  Circumflex gives a large branching first obtuse marginal, and prior to a significant angulation in the mid marginal there is 30% narrowing.  Right coronary is dominant and contains eccentric 30% ostial PDA.  Low normal LV systolic function with EF 45 to 50%.  Normal LVEDP.  RECOMMENDATIONS:   No high-grade focal obstruction and therefore no indication for PCI.  Consider atrial fibrillation as the cause of her symptoms related to poor rate control and demand ischemia.  Consider cardioversion.   Diagnostic Dominance: Right     Patient Profile      [name] [name] a 85 y.o.femalewith a hx of CAD(DES to LAD in 2006), obesity, type 2 diabetes mellitus, mild sleep apnea,Chronic diastolic HF (EF 08-81%, [id number]), recent diagnosis of paroxysmal atrial fibrillation on Eliquiswho is being seen for the evaluation of dyspnea on exertion and chest pressure.  Last Echo 11/11/18 @ [facility] to 65%, normal right ventricular systolic function, mild left ventricular hypertrophy, G2DD(elevated filling pressures), mitral annular calcification, mildly dilated left atrium, aortic sclerosis  Assessment & Plan    1. Chest pressure  - Rate related vs angina. Hs- troponin peaked at 3. EKG without ischemic changes.Cath showed non obstructive CAD as above. Her symptoms was due to elevated HR.   2. CAD s/p DES to LAD in 2006 - Patent stent by cath in 2010 - Pending cath today - Continue ASA and BB.   - 03/15/2019: Cholesterol 204; HDL 51; LDL Cholesterol 127; Triglycerides 128; VLDL 26  - Start Lipitor 40mg  qd  3. AFib RVR - Rate improved to 60s on IV cardizem >> stable on short acting post cath>>plan cardioversion later today>> consolidate to long acting at discharge.  - Eliquis resumed after cath  4. Acute on chronic diastolic CHF - Previously held lasix by PCP.  - BNP 635 - Evuvolemic - Likely rate related.  - Normal LVEDP  5. Hypokalemia - Resolved  6. HTN - Improved  For questions or updates, please contact [place] Please consult [email or web address] for contact info under       SignedLeanor Kail, PA  03/15/2019, 8:00 AM     Patient seen and examined. Agree with assessment and plan. No chest tightness.  AF rate controlled currently at 80, pt is standing.  On schedule for [facility] this afternoon. L radial site stable post cath; eliquis resumed last night post cath and received today. Eliquis was iniially started in March 2020.   Troy Sine, MD, [facility] 03/15/2019 9:46 AM

## 2019-03-15 NOTE — Transfer of Care (Signed)
Immediate Anesthesia Transfer of Care Note  Patient: [name] [name]  Procedure(s) Performed: CARDIOVERSION (N/A )  Patient Location: Endoscopy Unit  Anesthesia Type:MAC  Level of Consciousness: drowsy  Airway & Oxygen Therapy: Patient Spontanous Breathing  Post-op Assessment: Report given to RN and Post -op Vital signs reviewed and stable  Post vital signs: Reviewed and stable  Last Vitals:  Vitals Value Taken Time  BP 103/69   Temp    Pulse 65   Resp 16   SpO2 93     Last Pain:  Vitals:   03/15/19 1218  TempSrc: Oral  PainSc: 0-No pain         Complications: No apparent anesthesia complications

## 2019-03-15 NOTE — Care Management Important Message (Signed)
Important Message  Patient Details  Name: [name] [name] MRN: [id number] Date of Birth: 07-09-1934   Medicare Important Message Given:  Yes     [name] 03/15/2019, 12:25 PM

## 2019-03-15 NOTE — CV Procedure (Signed)
Cardioversion  Patient sedated by anesthesia with propofol and lidocaine intravenously  WIth pads in AP position, patient cardioverted to SR with 200 J synchronized biphasic energy   Procedure was without complications  Dorris Carnes MD

## 2019-03-15 NOTE — Progress Notes (Signed)
PROGRESS NOTE   [name] [name]  LFY:[id number]    DOB: Sep 30, 1933    DOA: 03/12/2019  PCP: [facility] [place]   I have briefly reviewed patients previous medical records in [facility].  Chief Complaint  Patient presents with  . Chest Pain    Brief Narrative:  85 year old female with PMH of CAD, DES to LAD in 2006, obesity, DM 2, mild sleep apnea, recent diagnosis of PAF on Eliquis, presented to ED due to [place] since 3 days PTA along with chest pressure.  In ED noted to be in A. fib with RVR.  CTA chest negative for PE, EKG with RBBB and negative high-sensitivity troponin.  Cardiac cath without high-grade obstructive disease.  Underwent successful DCCV 7/31.   Assessment & Plan:   Principal Problem:   Atrial fibrillation with RVR (HCC) Active Problems:   Hypothyroidism   Hyperlipidemia   Essential hypertension   CAD, NATIVE VESSEL   Chronic diastolic HF (heart failure) (HCC)   Fever   A. fib with RVR  chads2vasc score >3 and on Eliquis PTA.  Was initially on IV heparin for procedures then Eliquis resumed  Heart rate controlled on Toprol-XL 50 mg daily and Cardizem short-acting  Patient underwent successful DCCV 7/31.  Cardiology follow-up appreciated, plan to consolidating to long-acting Cardizem at discharge.  Chest pressure/CAD s/p DES to LAD in 2006  Cardiology consulted  Patent stent by cath in 2010.  Aspirin and beta-blockers continued.?  DC aspirin while on Eliquis.  Defer to cardiology.  Underwent cardiac cath 7/30.  No high-grade obstructive disease.  Chest pressure felt to be due to A. fib with RVR.  Stable.  Acute on chronic diastolic CHF  Lasix held by PCP.  Improved after Lasix IV x1 and clinically euvolemic.  Lasix held prior to cath.  Hypokalemia  Replaced  Essential hypertension  Controlled now.  Hypothyroid  TSH high suggesting hypothyroid and Synthroid was increased from 88 mcg to 100 mcg.  Recommend repeating TSH in 4 to 6  weeks.  Low-grade fever  No clinical concern for infectious etiology.  Obesity Estimated body mass index is 32.22 kg/m as calculated from the following:   Height as of this encounter: 5\' 3"  (1.6 m).   Weight as of this encounter: 82.5 kg.    DVT prophylaxis: IV heparin Code Status: Full Family Communication: None at bedside Disposition: DC home possibly 8/1.   Consultants:  Cardiology  Procedures:  Cardiac cath 7/30 DCCV 7/31  Antimicrobials:  None   Subjective: Patient seen this morning prior to procedure.  Was doing well.  Feels much better compared to admission.  No dyspnea, chest pain or palpitations.  Objective:  Vitals:   03/15/19 1308 03/15/19 1333 03/15/19 1335 03/15/19 1338  BP: 103/69 120/76 119/76 103/62  Pulse: 65 (!) 59 62 63  Resp: 15 (!) 21 18 20   Temp: 98 F (36.7 C)  98 F (36.7 C)   TempSrc: Temporal     SpO2: 94% 99% 98% 100%  Weight:      Height:        Examination:  General exam: Pleasant elderly female, moderately built and obese seen ambulating steadily around her bed in the room. Respiratory system: Clear to auscultation.  No increased work of breathing. Cardiovascular system: S1 & S2 heard, irregularly irregular. No JVD, murmurs, rubs, gallops or clicks. No pedal edema.  Telemetry personally reviewed: A. fib with controlled ventricular rate. Gastrointestinal system: Abdomen is nondistended, soft and nontender. No organomegaly or  masses felt. Normal bowel sounds heard. Central nervous system: Alert and oriented. No focal neurological deficits. Extremities: Symmetric 5 x 5 power. Skin: No rashes, lesions or ulcers Psychiatry: Judgement and insight appear normal. Mood & affect appropriate.     Data Reviewed: I have personally reviewed following labs and imaging studies  CBC: Recent Labs  Lab 03/12/19 1614 03/13/19 0237 03/14/19 0453 03/15/19 0545  WBC 7.3 8.2 7.9 6.2  HGB 16.8* 16.3* 15.6* 14.1  HCT 50.1* 47.8* 46.0 41.6   MCV 92.9 90.9 93.5 93.3  PLT 274 258 172 235   Basic Metabolic Panel: Recent Labs  Lab 03/12/19 1614 03/12/19 1825 03/13/19 0237 03/14/19 0453  NA 139  --  137 140  K 3.9  --  3.2* 4.7  CL 101  --  104 109  CO2 24  --  23 19*  GLUCOSE 94  --  117* 98  BUN 12  --  9 14  CREATININE 0.88  --  0.90 0.96  CALCIUM 10.2  --  9.5 9.2  MG  --  2.2  --   --    Liver Function Tests: Recent Labs  Lab 03/13/19 0237  AST 21  ALT 21  ALKPHOS 69  BILITOT 0.9  PROT 7.2  ALBUMIN 4.0    Cardiac Enzymes: No results for input(s): CKTOTAL, CKMB, CKMBINDEX, TROPONINI in the last 168 hours.  CBG: Recent Labs  Lab 03/14/19 1120 03/14/19 1617 03/14/19 2147 03/15/19 0641 03/15/19 1130  GLUCAP 83 79 86 99 88    Recent Results (from the past 240 hour(s))  Urine culture     Status: Abnormal   Collection Time: 03/12/19  6:15 PM   Specimen: Urine, Clean Catch  Result Value Ref Range Status   Specimen Description URINE, CLEAN CATCH  Final   Special Requests   Final    NONE Performed at [facility], [place] [address], [place], [place] [address]    Culture MULTIPLE SPECIES PRESENT, SUGGEST RECOLLECTION (A)  Final   Report Status 03/13/2019 FINAL  Final  Culture, blood (routine x 2)     Status: None (Preliminary result)   Collection Time: 03/12/19  6:24 PM   Specimen: BLOOD  Result Value Ref Range Status   Specimen Description BLOOD LEFT ANTECUBITAL  Final   Special Requests   Final    BOTTLES DRAWN AEROBIC AND ANAEROBIC Blood Culture adequate volume   Culture   Final    NO GROWTH 3 DAYS Performed at [facility], [place] [address], [place], [place] [address]    Report Status PENDING  Incomplete  SARS Coronavirus 2 (CEPHEID- Performed in [place] hospital lab), Hosp Order     Status: None   Collection Time: 03/12/19  6:25 PM   Specimen: Nasopharyngeal Swab  Result Value Ref Range Status   SARS Coronavirus 2 NEGATIVE NEGATIVE Final    Comment: (NOTE) If result is  NEGATIVE SARS-CoV-2 target nucleic acids are NOT DETECTED. The SARS-CoV-2 RNA is generally detectable in upper and lower  respiratory specimens during the acute phase of infection. The lowest  concentration of SARS-CoV-2 viral copies this assay can detect is 250  copies / mL. A negative result does not preclude SARS-CoV-2 infection  and should not be used as the sole basis for treatment or other  patient management decisions.  A negative result may occur with  improper specimen collection / handling, submission of specimen other  than nasopharyngeal swab, presence of viral mutation(s) within the  areas targeted by this assay, and inadequate number of viral copies  (<250 copies / mL). A negative result must be combined with clinical  observations, patient history, and epidemiological information. If result is POSITIVE SARS-CoV-2 target nucleic acids are DETECTED. The SARS-CoV-2 RNA is generally detectable in upper and lower  respiratory specimens dur ing the acute phase of infection.  Positive  results are indicative of active infection with SARS-CoV-2.  Clinical  correlation with patient history and other diagnostic information is  necessary to determine patient infection status.  Positive results do  not rule out bacterial infection or co-infection with other viruses. If result is PRESUMPTIVE POSTIVE SARS-CoV-2 nucleic acids MAY BE PRESENT.   A presumptive positive result was obtained on the submitted specimen  and confirmed on repeat testing.  While 2019 novel coronavirus  (SARS-CoV-2) nucleic acids may be present in the submitted sample  additional confirmatory testing may be necessary for epidemiological  and / or clinical management purposes  to differentiate between  SARS-CoV-2 and other Sarbecovirus currently known to infect humans.  If clinically indicated additional testing with an alternate test  methodology [phone]) is advised. The SARS-CoV-2 RNA is generally  detectable  in upper and lower respiratory sp ecimens during the acute  phase of infection. The expected result is Negative. Fact Sheet for Patients:  StrictlyIdeas.no Fact Sheet for Healthcare Providers: BankingDealers.co.za This test is not yet approved or cleared by the [place] FDA and has been authorized for detection and/or diagnosis of SARS-CoV-2 by FDA under an Emergency Use Authorization (EUA).  This EUA will remain in effect (meaning this test can be used) for the duration of the COVID-19 declaration under Section 564(b)(1) of the Act, 21 U.S.C. section 360bbb-3(b)(1), unless the authorization is terminated or revoked sooner. Performed at [facility], [place] [address], [place], [place] [address]   Culture, blood (routine x 2)     Status: None (Preliminary result)   Collection Time: 03/12/19 10:32 PM   Specimen: BLOOD  Result Value Ref Range Status   Specimen Description BLOOD LEFT RADIAL  Final   Special Requests   Final    BOTTLES DRAWN AEROBIC AND ANAEROBIC Blood Culture results may not be optimal due to an inadequate volume of blood received in culture bottles   Culture   Final    NO GROWTH 3 DAYS Performed at [facility], [place] [address], [place], [place] [address]    Report Status PENDING  Incomplete         Radiology Studies: No results found.      Scheduled Meds: . apixaban  5 mg Oral BID  . aspirin EC  81 mg Oral Daily  . atorvastatin  40 mg Oral q1800  . diltiazem  45 mg Oral Q6H  . hydrocortisone cream   Topical BID  . insulin aspart  0-9 Units Subcutaneous Q4H  . isosorbide mononitrate  120 mg Oral Daily  . levothyroxine  100 mcg Oral Q breakfast  . metoprolol succinate  50 mg Oral Daily  . potassium chloride  40 mEq Oral Daily  . sodium chloride flush  3 mL Intravenous Q12H  . sodium chloride flush  3 mL Intravenous Q12H  . sodium chloride flush  3 mL Intravenous Q12H  . sodium chloride  flush  3 mL Intravenous Q12H  . traZODone  225 mg Oral QHS  . venlafaxine XR  150 mg Oral BID   Continuous Infusions: . sodium chloride    . sodium  chloride       LOS: 2 days     Vernell Leep, MD, FACP, [facility]. [facility]  To contact the attending provider between 7A-7P or the covering provider during after hours 7P-7A, please log into the web site [email or web address] and access using universal [place] password for that web site. If you do not have the password, please call the hospital operator.  03/15/2019, 2:50 PM

## 2019-03-15 NOTE — Interval H&P Note (Signed)
History and Physical Interval Note:  03/15/2019 12:16 PM  [name] [name]  has presented today for surgery, with the diagnosis of AFIB.  The various methods of treatment have been discussed with the patient and family. After consideration of risks, benefits and other options for treatment, the patient has consented to  Procedure(s): CARDIOVERSION (N/A) as a surgical intervention.  The patient's history has been reviewed, patient examined, no change in status, stable for surgery.  I have reviewed the patient's chart and labs.  Questions were answered to the patient's satisfaction.     Dorris Carnes

## 2019-03-15 NOTE — Anesthesia Preprocedure Evaluation (Signed)
Anesthesia Evaluation  Patient identified by MRN, date of birth, ID band Patient awake    Reviewed: Allergy & Precautions, NPO status , Patient's Chart, lab work & pertinent test results  Airway Mallampati: II  TM Distance: >3 FB Neck ROM: Full    Dental no notable dental hx.    Pulmonary neg pulmonary ROS,    Pulmonary exam normal breath sounds clear to auscultation       Cardiovascular hypertension, + CAD  Normal cardiovascular exam+ dysrhythmias Atrial Fibrillation + Valvular Problems/Murmurs AI  Rhythm:Irregular Rate:Normal     Neuro/Psych negative neurological ROS  negative psych ROS   GI/Hepatic negative GI ROS, Neg liver ROS,   Endo/Other  negative endocrine ROSdiabetes  Renal/GU negative Renal ROS  negative genitourinary   Musculoskeletal negative musculoskeletal ROS (+)   Abdominal   Peds negative pediatric ROS (+)  Hematology negative hematology ROS (+)   Anesthesia Other Findings   Reproductive/Obstetrics negative OB ROS                             Anesthesia Physical Anesthesia Plan  ASA: III  Anesthesia Plan: General   Post-op Pain Management:    Induction: Intravenous  PONV Risk Score and Plan: 0  Airway Management Planned: Mask  Additional Equipment:   Intra-op Plan:   Post-operative Plan: Extubation in OR  Informed Consent: I have reviewed the patients History and Physical, chart, labs and discussed the procedure including the risks, benefits and alternatives for the proposed anesthesia with the patient or authorized representative who has indicated his/her understanding and acceptance.     Dental advisory given  Plan Discussed with: CRNA and Surgeon  Anesthesia Plan Comments:         Anesthesia Quick Evaluation

## 2019-03-15 NOTE — H&P (View-Only) (Signed)
Progress Note  Patient Name: [name] [name] Date of Encounter: 03/15/2019  Primary Cardiologist: [name], MD   Subjective   No chest pain or dyspnea.   Inpatient Medications    Scheduled Meds: . apixaban  5 mg Oral BID  . aspirin EC  81 mg Oral Daily  . diltiazem  45 mg Oral Q6H  . furosemide  20 mg Intravenous Daily  . insulin aspart  0-9 Units Subcutaneous Q4H  . isosorbide mononitrate  120 mg Oral Daily  . levothyroxine  100 mcg Oral Q breakfast  . metoprolol succinate  50 mg Oral Daily  . potassium chloride  40 mEq Oral Daily  . sodium chloride flush  3 mL Intravenous Q12H  . sodium chloride flush  3 mL Intravenous Q12H  . sodium chloride flush  3 mL Intravenous Q12H  . sodium chloride flush  3 mL Intravenous Q12H  . traZODone  225 mg Oral QHS  . venlafaxine XR  150 mg Oral BID   Continuous Infusions: . sodium chloride    . sodium chloride      Vital Signs    Vitals:   03/14/19 2030 03/15/19 0141 03/15/19 0238 03/15/19 0450  BP: 136/77 (!) 122/56  138/84  Pulse: 74 83  80  Resp: 20 20  20   Temp: 98 F (36.7 C) 98.3 F (36.8 C)  (!) 97.4 F (36.3 C)  TempSrc: Oral Oral  Oral  SpO2: 98% 96%  95%  Weight:   82.5 kg   Height:        Intake/Output Summary (Last 24 hours) at 03/15/2019 0800 Last data filed at 03/15/2019 0700 Gross per 24 hour  Intake 480 ml  Output 2100 ml  Net -1620 ml   Last 3 Weights 03/15/2019 03/14/2019 03/13/2019  Weight (lbs) 181 lb 14.4 oz 181 lb 4.8 oz 179 lb 14.3 oz  Weight (kg) 82.509 kg 82.237 kg 81.6 kg      Telemetry    Atrial fibrillation at rate of 60-80s - Personally Reviewed  ECG    N/A  Physical Exam   GEN: No acute distress.   Neck: No JVD Cardiac: Ir IR , no murmurs, rubs, or gallops. L Radial cath site without hematoma or bruise.  Respiratory: Clear to auscultation bilaterally. GI: Soft, nontender, non-distended  MS: No edema; No deformity. Neuro:  Nonfocal  Psych: Normal affect   Labs    High  Sensitivity Troponin:   Recent Labs  Lab 03/12/19 1614 03/12/19 1825 03/14/19 0453  TROPONINIHS 4 4 3       Cardiac EnzymesNo results for input(s): TROPONINI in the last 168 hours. No results for input(s): TROPIPOC in the last 168 hours.   Chemistry Recent Labs  Lab 03/12/19 1614 03/13/19 0237 03/14/19 0453  NA 139 137 140  K 3.9 3.2* 4.7  CL 101 104 109  CO2 24 23 19*  GLUCOSE 94 117* 98  BUN 12 9 14   CREATININE 0.88 0.90 0.96  CALCIUM 10.2 9.5 9.2  PROT  --  7.2  --   ALBUMIN  --  4.0  --   AST  --  21  --   ALT  --  21  --   ALKPHOS  --  69  --   BILITOT  --  0.9  --   GFRNONAA >60 59* 55*  GFRAA >60 >60 >60  ANIONGAP 14 10 12      Hematology Recent Labs  Lab 03/13/19 3903 03/14/19 0453 03/15/19 0545  WBC 8.2 7.9 6.2  RBC 5.26* 4.92 4.46  HGB 16.3* 15.6* 14.1  HCT 47.8* 46.0 41.6  MCV 90.9 93.5 93.3  MCH 31.0 31.7 31.6  MCHC 34.1 33.9 33.9  RDW 13.3 13.8 13.5  PLT 258 172 250    BNP Recent Labs  Lab 03/13/19 1018  BNP 635.2*     Radiology    No results found.  Cardiac Studies   LEFT HEART CATH AND CORONARY ANGIOGRAPHY  Conclusion   Normal left main  Proximal eccentric 30 to 50% LAD stenoses.  Diffuse 20 to 30% in-stent restenosis in mid LAD, and eccentric 40 to 60% [place] 011 bifurcation stenosis with a distal second diagonal.  The apical LAD contains mild to moderate diffuse disease.  Circumflex gives a large branching first obtuse marginal, and prior to a significant angulation in the mid marginal there is 30% narrowing.  Right coronary is dominant and contains eccentric 30% ostial PDA.  Low normal LV systolic function with EF 45 to 50%.  Normal LVEDP.  RECOMMENDATIONS:   No high-grade focal obstruction and therefore no indication for PCI.  Consider atrial fibrillation as the cause of her symptoms related to poor rate control and demand ischemia.  Consider cardioversion.   Diagnostic Dominance: Right     Patient Profile      [name] [name] a 85 y.o.femalewith a hx of CAD(DES to LAD in 2006), obesity, type 2 diabetes mellitus, mild sleep apnea,Chronic diastolic HF (EF 75-64%, [id number]), recent diagnosis of paroxysmal atrial fibrillation on Eliquiswho is being seen for the evaluation of dyspnea on exertion and chest pressure.  Last Echo 11/11/18 @ [place] to 65%, normal right ventricular systolic function, mild left ventricular hypertrophy, G2DD(elevated filling pressures), mitral annular calcification, mildly dilated left atrium, aortic sclerosis  Assessment & Plan    1. Chest pressure  - Rate related vs angina. Hs- troponin peaked at 3. EKG without ischemic changes.Cath showed non obstructive CAD as above. Her symptoms was due to elevated HR.   2. CAD s/p DES to LAD in 2006 - Patent stent by cath in 2010 - Pending cath today - Continue ASA and BB.   - 03/15/2019: Cholesterol 204; HDL 51; LDL Cholesterol 127; Triglycerides 128; VLDL 26  - Start Lipitor 40mg  qd  3. AFib RVR - Rate improved to 60s on IV cardizem >> stable on short acting post cath>>plan cardioversion later today>> consolidate to long acting at discharge.  - Eliquis resumed after cath  4. Acute on chronic diastolic CHF - Previously held lasix by PCP.  - BNP 635 - Evuvolemic - Likely rate related.  - Normal LVEDP  5. Hypokalemia - Resolved  6. HTN - Improved  For questions or updates, please contact [place] Please consult [email or web address] for contact info under       SignedLeanor Kail, PA  03/15/2019, 8:00 AM     Patient seen and examined. Agree with assessment and plan. No chest tightness.  AF rate controlled currently at 80, pt is standing.  On schedule for [facility] this afternoon. L radial site stable post cath; eliquis resumed last night post cath and received today. Eliquis was iniially started in March 2020.   Troy Sine, MD, [facility] 03/15/2019 9:46 AM

## 2019-03-16 DIAGNOSIS — I5032 Chronic diastolic (congestive) heart failure: Secondary | ICD-10-CM

## 2019-03-16 DIAGNOSIS — E7800 Pure hypercholesterolemia, unspecified: Secondary | ICD-10-CM

## 2019-03-16 LAB — CBC
HCT: 40.7 % (ref 36.0–46.0)
Hemoglobin: 13.5 g/dL (ref 12.0–15.0)
MCH: 31.3 pg (ref 26.0–34.0)
MCHC: 33.2 g/dL (ref 30.0–36.0)
MCV: 94.2 fL (ref 80.0–100.0)
Platelets: 239 10*3/uL (ref 150–400)
RBC: 4.32 MIL/uL (ref 3.87–5.11)
RDW: 13.5 % (ref 11.5–15.5)
WBC: 5.7 10*3/uL (ref 4.0–10.5)
nRBC: 0 % (ref 0.0–0.2)

## 2019-03-16 LAB — GLUCOSE, CAPILLARY
Glucose-Capillary: 91 mg/dL (ref 70–99)
Glucose-Capillary: 68 mg/dL — ABNORMAL LOW (ref 70–99)
Glucose-Capillary: 123 mg/dL — ABNORMAL HIGH (ref 70–99)

## 2019-03-16 MED ORDER — ATORVASTATIN CALCIUM 40 MG PO TABS: 40.0000 mg | ORAL_TABLET | Freq: Every day | ORAL | 0 refills | Status: DC

## 2019-03-16 MED ORDER — METOPROLOL SUCCINATE ER 50 MG PO TB24: 50.0000 mg | ORAL_TABLET | Freq: Every day | ORAL | 0 refills | Status: DC

## 2019-03-16 MED ORDER — FUROSEMIDE 40 MG PO TABS: 40.0000 mg | ORAL_TABLET | ORAL | 0 refills | Status: DC

## 2019-03-16 MED ORDER — LEVOTHYROXINE SODIUM 100 MCG PO TABS: 100.0000 ug | ORAL_TABLET | Freq: Every day | ORAL | 0 refills | Status: DC

## 2019-03-16 MED ORDER — DILTIAZEM HCL ER COATED BEADS 180 MG PO CP24: 180.0000 mg | ORAL_CAPSULE | Freq: Every day | ORAL | 0 refills | Status: DC

## 2019-03-16 NOTE — Progress Notes (Addendum)
Progress Note  Patient Name: [name] [name] Date of Encounter: 03/16/2019  Primary Cardiologist: [name], MD   Subjective   Breathing improved but not quite back to normal  Inpatient Medications    Scheduled Meds: . apixaban  5 mg Oral BID  . aspirin EC  81 mg Oral Daily  . atorvastatin  40 mg Oral q1800  . diltiazem  180 mg Oral Daily  . hydrocortisone cream   Topical BID  . insulin aspart  0-9 Units Subcutaneous Q4H  . isosorbide mononitrate  120 mg Oral Daily  . levothyroxine  100 mcg Oral Q breakfast  . metoprolol succinate  50 mg Oral Daily  . potassium chloride  40 mEq Oral Daily  . sodium chloride flush  3 mL Intravenous Q12H  . sodium chloride flush  3 mL Intravenous Q12H  . sodium chloride flush  3 mL Intravenous Q12H  . sodium chloride flush  3 mL Intravenous Q12H  . traZODone  225 mg Oral QHS  . venlafaxine XR  150 mg Oral BID   Continuous Infusions: . sodium chloride    . sodium chloride     PRN Meds: sodium chloride, acetaminophen, ondansetron (ZOFRAN) IV, oxyCODONE, sodium chloride flush, sodium chloride flush, sodium chloride flush   Vital Signs    Vitals:   03/15/19 1621 03/15/19 2000 03/16/19 0411 03/16/19 0416  BP: 128/75 123/69 113/69   Pulse: 74 71 (!) 58   Resp: 17 20 20    Temp: 97.7 F (36.5 C) 99.4 F (37.4 C) 97.7 F (36.5 C)   TempSrc: Oral Oral Oral   SpO2: 99% 96% 97%   Weight:    82.8 kg  Height:        Intake/Output Summary (Last 24 hours) at 03/16/2019 1052 Last data filed at 03/16/2019 0908 Gross per 24 hour  Intake 726 ml  Output 650 ml  Net 76 ml   Last 3 Weights 03/16/2019 03/15/2019 03/14/2019  Weight (lbs) 182 lb 8 oz 181 lb 14.4 oz 181 lb 4.8 oz  Weight (kg) 82.781 kg 82.509 kg 82.237 kg      Telemetry    NSR - Personally Reviewed  ECG    n/a - Personally Reviewed  Physical Exam   GEN: No acute distress.   Neck: No JVD Cardiac: RRR, no murmurs, rubs, or gallops.  Respiratory: Clear to auscultation  bilaterally. GI: Soft, nontender, non-distended  MS: No edema; No deformity. Neuro:  Nonfocal  Psych: Normal affect   Labs    High Sensitivity Troponin:   Recent Labs  Lab 03/12/19 1614 03/12/19 1825 03/14/19 0453  TROPONINIHS 4 4 3       Cardiac EnzymesNo results for input(s): TROPONINI in the last 168 hours. No results for input(s): TROPIPOC in the last 168 hours.   Chemistry Recent Labs  Lab 03/12/19 1614 03/13/19 0237 03/14/19 0453  NA 139 137 140  K 3.9 3.2* 4.7  CL 101 104 109  CO2 24 23 19*  GLUCOSE 94 117* 98  BUN 12 9 14   CREATININE 0.88 0.90 0.96  CALCIUM 10.2 9.5 9.2  PROT  --  7.2  --   ALBUMIN  --  4.0  --   AST  --  21  --   ALT  --  21  --   ALKPHOS  --  69  --   BILITOT  --  0.9  --   GFRNONAA >60 59* 55*  GFRAA >60 >60 >60  ANIONGAP 14 10 12  Hematology Recent Labs  Lab 03/14/19 0453 03/15/19 0545 03/16/19 0555  WBC 7.9 6.2 5.7  RBC 4.92 4.46 4.32  HGB 15.6* 14.1 13.5  HCT 46.0 41.6 40.7  MCV 93.5 93.3 94.2  MCH 31.7 31.6 31.3  MCHC 33.9 33.9 33.2  RDW 13.8 13.5 13.5  PLT 172 250 239    BNP Recent Labs  Lab 03/13/19 1018  BNP 635.2*     DDimer No results for input(s): DDIMER in the last 168 hours.   Radiology    No results found.  Cardiac Studies     Patient Profile     [name] [name] Coxis a 85 y.o.femalewith a hx of CAD(DES to LAD in 2006), obesity, type 2 diabetes mellitus, mild sleep apnea,Chronic diastolic HF (EF 38-10%, [id number]), recent diagnosis of paroxysmal atrial fibrillation on Eliquiswho is being seen for the evaluation of dyspnea on exertion and chest pressure.  Last Echo 11/11/18 @ [place] to 65%, normal right ventricular systolic function, mild left ventricular hypertrophy, G2DD(elevated filling pressures), mitral annular calcification, mildly dilated left atrium, aortic sclerosis  Assessment & Plan    1. Chest pressure/History of CAD - history of DES to LAD in 2006 -  normal trops, ekg without ischemic changes - 02/2019 cath: no significant disease.  - thought symptoms could be rate related from her afib   2. Afib with RVR - s/p DCCV 03/15/19 - she is on eliquis 5mg  bid, dilt 180 and toprol 50  -remains in SR today HR 60s, normal bp's.  3. Acute on chronic diasotlic HF - LV gram LVEF 45-50%. LVEDP is 5 and normal - no indication for diuresis   Ok for discharge from cardiac standpoint, we will arrange outpatient f/u  Addendum: would discharge on dilt 180, toprol 14m, eliquis 5mg  bid, ASA 81 mg (defer to primary cardiologist for continued ASA use at f/u), imdur 120.   Regarding diuretic only got 20mg  IV in hospital, filling pressures were normal by cath. Her symptoms were not secondary to HF, more so the afib. I would discharge on lasix 40mg  prn swelling, weight gain, or SOB. Reevaluate diuretic needs at f/u now that she is in [place] will sign off.   Medication Recommendations:  Continue current therapy Other recommendations (labs, testing, etc):  n/a Follow up as an outpatient:  We will arrange outpatient f/u        For questions or updates, please contact [place] Please consult [email or web address] for contact info under        Signed, Carlyle Dolly, MD  03/16/2019, 10:52 AM

## 2019-03-16 NOTE — Discharge Summary (Signed)
Physician Discharge Summary  [name] [name] KCL:[id number] DOB: 02/23/1934  PCP: [place], [place]  Admitted from: Home Discharged to: Home  Admit date: 03/12/2019 Discharge date: 03/16/2019  Recommendations for Outpatient Follow-up:  1. PCP in [place] in 1 week with repeat labs (CBC & BMP). Emerson, NP/Cardiology on 03/25/2019 at 1:15 PM. 3. Recommend repeating TSH in 4 to 6 weeks.  Follow-up Information    [name] Colonel, NP. Go on 03/25/2019.   Specialties: Nurse Practitioner, Radiology, Cardiology Why: @1 :15 pm for hosptial follow up with Dr. Victorino December PA/NP Contact information: [name] [id number] [name]. Schedule an appointment as soon as possible for a visit in 1 week(s).   Specialty: [place] Why: To be seen with repeat labs (CBC & BMP). Contact information: [address] [name] [address] [phone]        [name] Klein, MD .   Specialty: Cardiology Contact information: [address] [name] [address] [phone]            Home Health: None Equipment/Devices: None  Discharge Condition: Improved and stable CODE STATUS: Full Diet recommendation: Heart healthy diet  Discharge Diagnoses:  Principal Problem:   Atrial fibrillation with RVR ([place]) Active Problems:   Hypothyroidism   Hyperlipidemia   Essential hypertension   CAD, NATIVE VESSEL   Chronic diastolic HF (heart failure) (HCC)   Fever   Brief Summary: 85 year old female with PMH of CAD, DES to LAD in 2006, obesity, DM 2, mild sleep apnea, recent diagnosis of PAF on Eliquis, presented to ED due to DOE since 3 days PTA along with chest pressure.  In ED noted to be in A. fib with RVR.  CTA chest negative for PE, EKG with RBBB and negative high-sensitivity troponin.  Cardiac cath without high-grade obstructive disease.  Underwent successful DCCV  7/31.   Assessment & Plan:  A. fib with RVR  chads2vasc score >3 and on Eliquis PTA.  Was initially on IV heparin for procedures then Eliquis resumed  Patient underwent successful DCCV 7/31.  Remains in sinus rhythm.  Continue Toprol-XL 50 mg daily and Cardizem CD 180 mg daily.  Continue prior home dose of Eliquis  Cardiology follow-up appreciated, cleared for discharge home  Chest pressure/CAD s/p DES to LAD in 2006  Cardiology consulted  Patent stent by cath in 2010.  Aspirin and beta-blockers continued.?  DC aspirin while on Eliquis.  Defer to cardiology.  Underwent cardiac cath 7/30.  No high-grade obstructive disease.  Chest pressure felt to be due to A. fib with RVR.  Stable without recurrence.  Acute on chronic diastolic CHF  Lasix held by PCP.  Improved after Lasix IV x1 and clinically euvolemic.    Discussed with Dr. Harl Bowie, cardiology today.  He recommends stopping HCTZ, ARB at discharge and change Lasix only to PRN and this will be reassessed during outpatient cardiology follow-up.  Hypokalemia  Replaced  Essential hypertension  Controlled now.  Continue Toprol-XL, Cardizem, Imdur.  Hypothyroid  TSH high suggesting hypothyroid and Synthroid was increased from 88 mcg to 100 mcg.  Recommend repeating TSH in 4 to 6 weeks.  Low-grade fever  No clinical concern for infectious etiology.  Obesity Estimated body mass index is 32.22 kg/m as calculated from the following:   Height as of this encounter: 5\' 3"  (1.6 m).   Weight as of this encounter: 82.5 kg.  Consultants:  Cardiology  Procedures:  Cardiac cath 7/30 DCCV 7/31   Discharge Instructions  Discharge Instructions    (HEART FAILURE PATIENTS) Call MD:  Anytime you have any of the following symptoms: 1) 3 pound weight gain in 24 hours or 5 pounds in 1 week 2) shortness of breath, with or without a dry hacking cough 3) swelling in the hands, feet or stomach 4) if you have to  sleep on extra pillows at night in order to breathe.   Complete by: As directed    Call MD for:  difficulty breathing, headache or visual disturbances   Complete by: As directed    Call MD for:  extreme fatigue   Complete by: As directed    Call MD for:  persistant dizziness or light-headedness   Complete by: As directed    Call MD for:  persistant nausea and vomiting   Complete by: As directed    Call MD for:  severe uncontrolled pain   Complete by: As directed    Call MD for:  temperature >100.4   Complete by: As directed    Diet - low sodium heart healthy   Complete by: As directed    Increase activity slowly   Complete by: As directed        Medication List    STOP taking these medications   hydrochlorothiazide 12.5 MG capsule Commonly known as: MICROZIDE   losartan 100 MG tablet Commonly known as: COZAAR   methocarbamol 500 MG tablet Commonly known as: ROBAXIN   metoprolol tartrate 50 MG tablet Commonly known as: LOPRESSOR   traMADol 50 MG tablet Commonly known as: Ultram     TAKE these medications   apixaban 5 MG Tabs tablet Commonly known as: ELIQUIS Take 5 mg by mouth 2 (two) times daily.   aspirin EC 81 MG tablet Take 81 mg by mouth daily.   atorvastatin 40 MG tablet Commonly known as: LIPITOR Take 1 tablet (40 mg total) by mouth daily at 6 PM.   calcium carbonate 600 MG Tabs tablet Commonly known as: OS-CAL Take 600 mg by mouth daily with breakfast.   diltiazem 180 MG 24 hr capsule Commonly known as: CARDIZEM CD Take 1 capsule (180 mg total) by mouth daily. Start taking on: March 17, 2019   furosemide 40 MG tablet Commonly known as: LASIX Take 1 tablet (40 mg total) by mouth See admin instructions. Take 1 tab (40 mg total) as needed for leg swelling, weight gain of more than 3 lbs in one day or 5 lbs in 1 week or SOB. What changed:   when to take this  additional instructions   isosorbide mononitrate 120 MG 24 hr tablet Commonly known  as: IMDUR Take 120 mg by mouth Daily.   levothyroxine 100 MCG tablet Commonly known as: SYNTHROID Take 1 tablet (100 mcg total) by mouth daily. What changed:   medication strength  how much to take   metoprolol succinate 50 MG 24 hr tablet Commonly known as: TOPROL-XL Take 1 tablet (50 mg total) by mouth daily. Take with or immediately following a meal. Start taking on: March 17, 2019   traZODone 150 MG tablet Commonly known as: DESYREL Take 225 mg by mouth at bedtime.   venlafaxine XR 150 MG 24 hr capsule Commonly known as: EFFEXOR-XR Take 150 mg by mouth 2 (two) times a day.      Allergies  Allergen Reactions  . No Known Allergies       Procedures/Studies:  Ct Angio Chest Pe W And/or Wo Contrast  Result Date: 03/12/2019 CLINICAL DATA:  Chest heaviness and diaphoresis EXAM: CT ANGIOGRAPHY CHEST WITH CONTRAST TECHNIQUE: Multidetector CT imaging of the chest was performed using the standard protocol during bolus administration of intravenous contrast. Multiplanar CT image reconstructions and MIPs were obtained to evaluate the vascular anatomy. CONTRAST:  46mL OMNIPAQUE IOHEXOL 350 MG/ML SOLN COMPARISON:  03/12/2019 radiograph FINDINGS: Cardiovascular: Satisfactory opacification of the pulmonary arteries to the segmental level. No evidence of pulmonary embolism. Nonaneurysmal aorta. Mild aortic atherosclerosis. Coronary vascular calcification. Mild cardiomegaly. No pericardial effusion. Mediastinum/Nodes: Midline trachea. No thyroid mass. No significant adenopathy. Small distal esophageal hiatal hernia Lungs/Pleura: Lungs are clear. No pleural effusion or pneumothorax. Upper Abdomen: No acute abnormality. Musculoskeletal: Degenerative changes. Incompletely visualized superior endplate fracture at R15. Review of the MIP images confirms the above findings. IMPRESSION: 1. Negative for acute pulmonary embolus. 2. Clear lung fields Aortic Atherosclerosis (ICD10-I70.0). Electronically  Signed   By: Donavan Foil M.D.   On: 03/12/2019 21:46   Dg Chest Portable 1 View  Result Date: 03/12/2019 CLINICAL DATA:  Chest discomfort EXAM: PORTABLE CHEST 1 VIEW COMPARISON:  September 13, 2016 FINDINGS: There is no edema or consolidation. Heart is mildly enlarged with pulmonary vascularity normal. Soft tissue prominence right paratracheal region is stable and felt to represent great vessel prominence. No adenopathy. No pneumothorax. No bone lesions. IMPRESSION: No edema or consolidation. Stable cardiac silhouette. No evident adenopathy. Electronically Signed   By: Lowella Grip III M.D.   On: 03/12/2019 18:25      Subjective: Patient denies complaints.  Denies dyspnea, chest pain, dizziness or lightheadedness.  Has the brief and mild low CBG in the high 60s, resolved after diet.  As per RN, no acute issues and ambulated without discomfort.  Discharge Exam:  Vitals:   03/15/19 2000 03/16/19 0411 03/16/19 0416 03/16/19 1201  BP: 123/69 113/69  122/67  Pulse: 71 (!) 58  60  Resp: 20 20  16   Temp: 99.4 F (37.4 C) 97.7 F (36.5 C)  97.7 F (36.5 C)  TempSrc: Oral Oral  Oral  SpO2: 96% 97%  99%  Weight:   82.8 kg   Height:        General exam: Pleasant elderly female, moderately built and obese  lying comfortably propped up in bed. Respiratory system: Clear to auscultation.  No increased work of breathing. Cardiovascular system: S1 & S2 heard, RRR. No JVD, murmurs, rubs, gallops or clicks. No pedal edema.  Telemetry personally reviewed: Sinus rhythm Gastrointestinal system: Abdomen is nondistended, soft and nontender. No organomegaly or masses felt. Normal bowel sounds heard. Central nervous system: Alert and oriented. No focal neurological deficits. Extremities: Symmetric 5 x 5 power. Skin: No rashes, lesions or ulcers Psychiatry: Judgement and insight appear normal. Mood & affect appropriate.     The results of significant diagnostics from this hospitalization (including  imaging, microbiology, ancillary and laboratory) are listed below for reference.     Microbiology: Recent Results (from the past 240 hour(s))  Urine culture     Status: Abnormal   Collection Time: 03/12/19  6:15 PM   Specimen: Urine, Clean Catch  Result Value Ref Range Status   Specimen Description URINE, CLEAN CATCH  Final   Special Requests   Final    NONE Performed at [facility], [address], [place], [place] [address]    Culture MULTIPLE SPECIES PRESENT, SUGGEST RECOLLECTION (A)  Final   Report Status 03/13/2019 FINAL  Final  Culture, blood (routine x 2)     Status: None (Preliminary result)   Collection Time: 03/12/19  6:24 PM   Specimen: BLOOD  Result Value Ref Range Status   Specimen Description BLOOD LEFT ANTECUBITAL  Final   Special Requests   Final    BOTTLES DRAWN AEROBIC AND ANAEROBIC Blood Culture adequate volume   Culture   Final    NO GROWTH 4 DAYS Performed at [facility], [address], [place], [place] [address]    Report Status PENDING  Incomplete  SARS Coronavirus 2 (CEPHEID- Performed in [place] [facility] lab), Hosp Order     Status: None   Collection Time: 03/12/19  6:25 PM   Specimen: Nasopharyngeal Swab  Result Value Ref Range Status   SARS Coronavirus 2 NEGATIVE NEGATIVE Final    Comment: (NOTE) If result is NEGATIVE SARS-CoV-2 target nucleic acids are NOT DETECTED. The SARS-CoV-2 RNA is generally detectable in upper and lower  respiratory specimens during the acute phase of infection. The lowest  concentration of SARS-CoV-2 viral copies this assay can detect is 250  copies / mL. A negative result does not preclude SARS-CoV-2 infection  and should not be used as the sole basis for treatment or other  patient management decisions.  A negative result may occur with  improper specimen collection / handling, submission of specimen other  than nasopharyngeal swab, presence of viral mutation(s) within the  areas targeted by this  assay, and inadequate number of viral copies  (<250 copies / mL). A negative result must be combined with clinical  observations, patient history, and epidemiological information. If result is POSITIVE SARS-CoV-2 target nucleic acids are DETECTED. The SARS-CoV-2 RNA is generally detectable in upper and lower  respiratory specimens dur ing the acute phase of infection.  Positive  results are indicative of active infection with SARS-CoV-2.  Clinical  correlation with patient history and other diagnostic information is  necessary to determine patient infection status.  Positive results do  not rule out bacterial infection or co-infection with other viruses. If result is PRESUMPTIVE POSTIVE SARS-CoV-2 nucleic acids MAY BE PRESENT.   A presumptive positive result was obtained on the submitted specimen  and confirmed on repeat testing.  While 2019 novel coronavirus  (SARS-CoV-2) nucleic acids may be present in the submitted sample  additional confirmatory testing may be necessary for epidemiological  and / or clinical management purposes  to differentiate between  SARS-CoV-2 and other Sarbecovirus currently known to infect humans.  If clinically indicated additional testing with an alternate test  methodology [phone]) is advised. The SARS-CoV-2 RNA is generally  detectable in upper and lower respiratory sp ecimens during the acute  phase of infection. The expected result is Negative. Fact Sheet for Patients:  StrictlyIdeas.no Fact Sheet for Healthcare Providers: BankingDealers.co.za This test is not yet approved or cleared by the [place] FDA and has been authorized for detection and/or diagnosis of SARS-CoV-2 by FDA under an Emergency Use Authorization (EUA).  This EUA will remain in effect (meaning this test can be used) for the duration of the COVID-19 declaration under Section 564(b)(1) of the Act, 21 U.S.C. section 360bbb-3(b)(1),  unless the authorization is terminated or revoked sooner. Performed at [facility], [place] [address], [place],  [address]   Culture, blood (routine x 2)     Status: None (Preliminary result)   Collection Time: 03/12/19 10:32 PM   Specimen: BLOOD  Result Value Ref Range Status  Specimen Description BLOOD LEFT RADIAL  Final   Special Requests   Final    BOTTLES DRAWN AEROBIC AND ANAEROBIC Blood Culture results may not be optimal due to an inadequate volume of blood received in culture bottles   Culture   Final    NO GROWTH 4 DAYS Performed at [facility], [place] [address] [place], [place] [address]    Report Status PENDING  Incomplete     Labs: CBC: Recent Labs  Lab 03/12/19 1614 03/13/19 0237 03/14/19 0453 03/15/19 0545 03/16/19 0555  WBC 7.3 8.2 7.9 6.2 5.7  HGB 16.8* 16.3* 15.6* 14.1 13.5  HCT 50.1* 47.8* 46.0 41.6 40.7  MCV 92.9 90.9 93.5 93.3 94.2  PLT 274 258 172 250 607   Basic Metabolic Panel: Recent Labs  Lab 03/12/19 1614 03/12/19 1825 03/13/19 0237 03/14/19 0453  NA 139  --  137 140  K 3.9  --  3.2* 4.7  CL 101  --  104 109  CO2 24  --  23 19*  GLUCOSE 94  --  117* 98  BUN 12  --  9 14  CREATININE 0.88  --  0.90 0.96  CALCIUM 10.2  --  9.5 9.2  MG  --  2.2  --   --    Liver Function Tests: Recent Labs  Lab 03/13/19 0237  AST 21  ALT 21  ALKPHOS 69  BILITOT 0.9  PROT 7.2  ALBUMIN 4.0   BNP (last 3 results) Recent Labs    03/13/19 1018  BNP 635.2*   CBG: Recent Labs  Lab 03/15/19 1623 03/15/19 2127 03/16/19 0624 03/16/19 1202 03/16/19 1234  GLUCAP 144* 107* 91 68* 123*   Lipid Profile Recent Labs    03/15/19 0545  CHOL 204*  HDL 51  LDLCALC 127*  TRIG 128  CHOLHDL 4.0   Urinalysis    Component Value Date/Time   COLORURINE YELLOW 03/12/2019 1843   APPEARANCEUR CLEAR 03/12/2019 1843   LABSPEC 1.017 03/12/2019 1843   PHURINE 5.0 03/12/2019 1843   GLUCOSEU NEGATIVE 03/12/2019 1843   HGBUR MODERATE  (A) 03/12/2019 1843   BILIRUBINUR NEGATIVE 03/12/2019 [place] 03/12/2019 1843   PROTEINUR 30 (A) 03/12/2019 1843   UROBILINOGEN 1.0 06/25/2015 0932   NITRITE NEGATIVE 03/12/2019 1843   LEUKOCYTESUR SMALL (A) 03/12/2019 1843    Unsuccessfully attempted to reach patient's daughter via phone, left VM message.  Time coordinating discharge: 35 minutes  SIGNED:  Vernell Leep, MD, FACP, [facility]. [facility]  To contact the attending provider between 7A-7P or the covering provider during after hours 7P-7A, please log into the web site [email or web address] and access using universal [place] password for that web site. If you do not have the password, please call the hospital operator.

## 2019-03-16 NOTE — Progress Notes (Signed)
Pt has orders to be discharged. Discharge instructions given and pt has no additional questions at this time. Medication regimen reviewed and pt educated. Pt verbalized understanding and has no additional questions. Telemetry box removed. IV removed and site in good condition. Pt stable and waiting for transportation. 

## 2019-03-16 NOTE — Discharge Instructions (Signed)

## 2019-03-16 NOTE — Progress Notes (Signed)
Hypoglycemic Event  CBG: 68  Treatment: 8 oz juice/soda  Symptoms: None  Follow-up CBG: Time:1234 CBG Result:123  Possible Reasons for Event: Unknown  Comments/MD notified: MD text paged regarding results    Mar Daring

## 2019-03-17 ENCOUNTER — Encounter (HOSPITAL_COMMUNITY): Payer: Self-pay | Admitting: Internal Medicine

## 2019-03-17 LAB — CULTURE, BLOOD (ROUTINE X 2)
Culture: NO GROWTH
Culture: NO GROWTH
Special Requests: ADEQUATE

## 2019-03-18 NOTE — Anesthesia Postprocedure Evaluation (Signed)
Anesthesia Post Note  Patient: [name] [name]  Procedure(s) Performed: CARDIOVERSION (N/A )     Patient location during evaluation: PACU Anesthesia Type: General Level of consciousness: awake and alert Pain management: pain level controlled Vital Signs Assessment: post-procedure vital signs reviewed and stable Respiratory status: spontaneous breathing, nonlabored ventilation, respiratory function stable and patient connected to nasal cannula oxygen Cardiovascular status: blood pressure returned to baseline and stable Postop Assessment: no apparent nausea or vomiting Anesthetic complications: no    Last Vitals:  Vitals:   03/16/19 0411 03/16/19 1201  BP: 113/69 122/67  Pulse: (!) 58 60  Resp: 20 16  Temp: 36.5 C 36.5 C  SpO2: 97% 99%    Last Pain:  Vitals:   03/16/19 1201  TempSrc: Oral  PainSc:                  [name]

## 2019-03-24 NOTE — Progress Notes (Signed)
Cardiology Office Note   Date:  03/25/2019   ID:  [name] [name], DOB [birth year], MRN [id number]  PCP:  [facility]  Cardiologist: Dr.Croitoru  CC: Hospital follow up    History of Present Illness: [name] [name] is a 85 y.o. female who presents for post hospital follow up after being seen on consultation by Dr. Claiborne Billings for DOE. [name] [name] has a history of  CAD (DES to LAD in 2006), obesity, type 2 diabetes mellitus, mild sleep apnea, Chronic diastolic HF (EF 29-52%, W4XL 2020), recent diagnosis of paroxysmal atrial fibrillation on Eliquis.   [name] [name] presented to the ED for DOE and constant chest pressure. She was found to be in atrial fibrillation and hypokalemia. She was planned for cardiac cath due to symptoms and hx. Cath completed on 03/14/2019 by Dr. Tamala Julian revealed Normal left main Segmental 40 to 50% eccentric proximal LAD, widely patent mid LAD stent, 60 to 70% LAD diagonal #2 bifurcation stenosis in worse views and 50% in most views.   Normal circumflex.    [name] [name] had a DCCV by Dr. Harrington Challenger on 03/15/2019 which was successful in returning her to NSR. She was continued on Eliquis for CHADS VASC Score of 4. She was continued on Toprol XL 50 mg and Cardizem 180. ASA was discontinued due to use of Eliquis. She was to use Lasix prn. Synthroid was increased to 100 mcg due to elevated TSH.   She is here today not feeling much better since discharge from the hospital.  Her main complaint today is continued dyspnea on exertion.  She remains in normal sinus rhythm but her breathing status is not improved.  She states that she tries to walk to her mailbox 3 times a day but has to stop each time to catch her breath.  She had a sleep study with [facility] in March of  2020 but has not had any results for this.    Past Medical History:  Diagnosis Date  . Anginal pain ([place])   . Anxiety   . Arthritis    "little in my thumbs"back & knees   . Breast cancer ([place])    R breast, treated with  mastectomy/tomoxifen in early 2000s  . Bronchitis    hx of  . CAD (coronary artery disease)    The patient had a left heart catheterization in August 2006 showed an EF of 70%, a 70% mid LAD lesion and 80% distal LAD ledsion, as well as 50% ostial first diagonal lesion.  The patient did have a drug-eluting stent placed in her mid LAD at that time.    . Chest pressure 03/13/2019  . Depression   . Diastolic heart failure    Echo 11/10 with EF 55-60%, moderate diastolic dysfunction, no regional WMAs, mild to moderate TR, moderate LAE, mild AI  . Frequent urination at night   . GERD (gastroesophageal reflux disease)    "gone since gallbladder took out"  . Heart murmur    "small"  . Hypercholesterolemia   . Hypertension   . Hypothyroidism   . Mental disorder   . Sleep disturbance    sleep study done- told that there wasn't any apnea concerns, states she is up & down for use of bathroom several times per night   . Type II diabetes mellitus [place]    "controlled w/diet and exercise"  . Urinary incontinence     Past Surgical History:  Procedure Laterality Date  . [place]  62 GAUGE MVR PORT Right 06/04/2013   Procedure: 25 GAUGE PARS PLANA VITRECTOMY WITH 20 GAUGE MVR PORT / REMOVAL SILICONE OIL RIGHT EYE. With Endolaser;  Surgeon: Hayden Pedro, MD;  Location: [place];  Service: Ophthalmology;  Laterality: Right;  . ABDOMINAL HYSTERECTOMY  1964  . Adenosine Myoview     9/09: EF 69% with normal perfusion images suggesting no ischemia or infarction.    . APPENDECTOMY  1954  . BACK SURGERY  06/2015   fusion-lumbar  . BREAST BIOPSY  2001   right  . CARDIAC CATHETERIZATION    . CARDIOVERSION N/A 03/15/2019   Procedure: CARDIOVERSION;  Surgeon: Fay Records, MD;  Location: [place];  Service: Cardiovascular;  Laterality: N/A;  . CATARACT EXTRACTION W/ INTRAOCULAR LENS  IMPLANT, BILATERAL  ~ 2000  . CHOLECYSTECTOMY  ~ 2004  . COLONOSCOPY    . CORONARY  ANGIOPLASTY    . CORONARY ANGIOPLASTY WITH STENT PLACEMENT  2000's   "1"  . DILATION AND CURETTAGE OF UTERUS  1960's   "I had 3"  . EYE SURGERY     /iol    following cataract removal  . GAS/FLUID EXCHANGE Right 06/04/2013   Procedure: GAS/FLUID EXCHANGE;  Surgeon: Hayden Pedro, MD;  Location: [place];  Service: Ophthalmology;  Laterality: Right;  . LEFT HEART CATH AND CORONARY ANGIOGRAPHY N/A 03/14/2019   Procedure: LEFT HEART CATH AND CORONARY ANGIOGRAPHY;  Surgeon: Belva Crome, MD;  Location: [facility];  Service: Cardiovascular;  Laterality: N/A;  . MASTECTOMY  2001   right breast  . MEMBRANE PEEL Right 06/04/2013   Procedure: MEMBRANE PEEL;  Surgeon: Hayden Pedro, MD;  Location: [place];  Service: Ophthalmology;  Laterality: Right;  . NEUROPLASTY / TRANSPOSITION MEDIAN NERVE AT CARPAL TUNNEL BILATERAL  1990's  . PARS PLANA VITRECTOMY  04/03/2012   right  . PARS PLANA VITRECTOMY  04/03/2012   Procedure: PARS PLANA VITRECTOMY WITH 25 GAUGE;  Surgeon: Hayden Pedro, MD;  Location: [place];  Service: Ophthalmology;  Laterality: Right;  Silicone Oil, Perfluron, Repair Complex Traction Retinal Detachment  . PLANTAR FASCIA RELEASE  1990's   left  . TONSILLECTOMY AND ADENOIDECTOMY  1972  . TOTAL KNEE ARTHROPLASTY Left 10/13/2015   Procedure: TOTAL KNEE ARTHROPLASTY;  Surgeon: Melrose Nakayama, MD;  Location: [place];  Service: Orthopedics;  Laterality: Left;  . TOTAL KNEE ARTHROPLASTY Right 09/20/2016   Procedure: TOTAL KNEE ARTHROPLASTY;  Surgeon: Melrose Nakayama, MD;  Location: [place];  Service: Orthopedics;  Laterality: Right;  . ULNAR NERVE REPAIR     left arm     Current Outpatient Medications  Medication Sig Dispense Refill  . apixaban (ELIQUIS) 5 MG TABS tablet Take 5 mg by mouth 2 (two) times daily.     Marland Kitchen aspirin EC 81 MG tablet Take 81 mg by mouth daily.    Marland Kitchen atorvastatin (LIPITOR) 40 MG tablet Take 1 tablet (40 mg total) by mouth daily at 6 PM. 30 tablet 0  . calcium  carbonate (OS-CAL) 600 MG TABS tablet Take 600 mg by mouth daily with breakfast.    . diltiazem (CARDIZEM CD) 180 MG 24 hr capsule Take 1 capsule (180 mg total) by mouth daily. 30 capsule 0  . furosemide (LASIX) 40 MG tablet Take 1 tablet (40 mg total) by mouth See admin instructions. Take 1 tab (40 mg total) as needed for leg swelling, weight gain of more than 3 lbs in one day or 5 lbs in 1  week or SOB. 30 tablet 0  . isosorbide mononitrate (IMDUR) 120 MG 24 hr tablet Take 120 mg by mouth Daily.     Marland Kitchen levothyroxine (SYNTHROID) 100 MCG tablet Take 1 tablet (100 mcg total) by mouth daily. 30 tablet 0  . metoprolol succinate (TOPROL-XL) 50 MG 24 hr tablet Take 1 tablet (50 mg total) by mouth daily. Take with or immediately following a meal. 30 tablet 0  . traZODone (DESYREL) 150 MG tablet Take 225 mg by mouth at bedtime.     Marland Kitchen venlafaxine XR (EFFEXOR-XR) 150 MG 24 hr capsule Take 150 mg by mouth 2 (two) times a day.      No current facility-administered medications for this visit.     Allergies:   No known allergies    Social History:  The patient  reports that she has never smoked. She has never used smokeless tobacco. She reports that she does not drink alcohol or use drugs.   Family History:  The patient's family history includes Alcoholism in her father; Cancer in her brother and mother.    ROS: All other systems are reviewed and negative. Unless otherwise mentioned in H&P    PHYSICAL EXAM: VS:  BP (!) 144/91   Pulse 80   Temp 99 F (37.2 C) (Temporal)   Ht 5\' 3"  (1.6 m)   Wt 180 lb (81.6 kg)   SpO2 99%   BMI 31.89 kg/m  , BMI Body mass index is 31.89 kg/m. GEN: Well nourished, well developed, in no acute distress.  HEENT: normal Neck: no JVD, carotid bruits, or masses Cardiac: RRR; no murmurs, rubs, or gallops,no edema  Respiratory:  Clear to auscultation bilaterally, normal work of breathing GI: soft, nontender, nondistended, + BS MS: no deformity or atrophy Skin: warm  and dry, no rash Neuro:  Strength and sensation are intact Psych: euthymic mood, full affect   EKG: NSR with Bifascicular Block, HR of 80 bpm.  Recent Labs: 03/12/2019: Magnesium 2.2; TSH 7.938 03/13/2019: ALT 21; B Natriuretic Peptide 635.2 03/14/2019: BUN 14; Creatinine, Ser 0.96; Potassium 4.7; Sodium 140 03/16/2019: Hemoglobin 13.5; Platelets 239    Lipid Panel    Component Value Date/Time   CHOL 204 (H) 03/15/2019 0545   TRIG 128 03/15/2019 0545   HDL 51 03/15/2019 0545   CHOLHDL 4.0 03/15/2019 0545   VLDL 26 03/15/2019 0545   LDLCALC 127 (H) 03/15/2019 0545      Wt Readings from Last 3 Encounters:  03/25/19 180 lb (81.6 kg)  03/16/19 182 lb 8 oz (82.8 kg)  12/20/18 180 lb (81.6 kg)      Other studies Reviewed: Cardiac Cath 03/14/2019  Normal left main  Proximal eccentric 30 to 50% LAD stenoses.  Diffuse 20 to 30% in-stent restenosis in mid LAD, and eccentric 40 to 60% [place] 011 bifurcation stenosis with a distal second diagonal.  The apical LAD contains mild to moderate diffuse disease.  Circumflex gives a large branching first obtuse marginal, and prior to a significant angulation in the mid marginal there is 30% narrowing.  Right coronary is dominant and contains eccentric 30% ostial PDA.  Low normal LV systolic function with EF 45 to 50%.  Normal LVEDP.  RECOMMENDATIONS:   No high-grade focal obstruction and therefore no indication for PCI.  Consider atrial fibrillation as the cause of her symptoms related to poor rate control and demand ischemia.  Consider cardioversion.  Echo 11/11/18 @ [facility]  Normal left ventricular systolic function, ejection fraction 60 to 65%  Normal right ventricular systolic function  Left ventricular hypertrophy - mild  Diastolic dysfunction - grade II (elevated filling pressures)  Mitral annular calcification  Dilated left atrium - mild  Aortic sclerosis  Long term Monitor Interpretation 01/31/2019  Persistent atrial fibrillation with fair ventricular rate control, but with frequent rapid ventricular response during activity. Bradycardia is not seen. No significant ventricular arrhythmia.   ASSESSMENT AND PLAN:  1. Dyspnea: This has been ongoing and frustrating for her. She had a sleep study in March of 2020. We were able to find the results after she left the office, by calling [facility].    The study revealed that she has OSA and it was recommended that she have a CPAP study.  She is being referred to Barry Brunner, and Dr. Claiborne Billings for follow up and initiation of CPAP.  I have called the patient at the numbers provided in her chart but she did not answer, LVM.   2. Hypertension: BP was elevated today in the office.  Rechecked it manually and this correlated. Will need to watch this and make medication changes if elevated on follow up.  Will initiation of CPAP, BP may improve.   3. PAF: She is s/p DCCV and remains in NSR. She is rate controlled.  Continue Eliquis. She is having trouble affording this. Samples are provided along with paperwork to fill out for medication assistance.    Current medicines are reviewed at length with the patient today.    Labs/ tests ordered today include: None. Referral for CPAP study.    [name] [name], ANP, AACC   03/25/2019 3:12 PM    [place] [address] Office [phone] Fax [phone]

## 2019-03-25 ENCOUNTER — Encounter: Payer: Self-pay | Admitting: Adult Health

## 2019-03-25 ENCOUNTER — Ambulatory Visit (INDEPENDENT_AMBULATORY_CARE_PROVIDER_SITE_OTHER): Payer: Medicare Other | Admitting: Adult Health

## 2019-03-25 ENCOUNTER — Telehealth: Payer: Self-pay | Admitting: Adult Health

## 2019-03-25 ENCOUNTER — Other Ambulatory Visit: Payer: Self-pay

## 2019-03-25 VITALS — BP 144/91 | HR 80 | Temp 99.0°F | Ht 63.0 in | Wt 180.0 lb

## 2019-03-25 DIAGNOSIS — I480 Paroxysmal atrial fibrillation: Secondary | ICD-10-CM | POA: Diagnosis not present

## 2019-03-25 DIAGNOSIS — G4733 Obstructive sleep apnea (adult) (pediatric): Secondary | ICD-10-CM | POA: Diagnosis not present

## 2019-03-25 DIAGNOSIS — R0600 Dyspnea, unspecified: Secondary | ICD-10-CM

## 2019-03-25 DIAGNOSIS — R0609 Other forms of dyspnea: Secondary | ICD-10-CM | POA: Diagnosis not present

## 2019-03-25 NOTE — Progress Notes (Signed)
Thanks you!

## 2019-03-25 NOTE — Patient Instructions (Signed)
Medication Instructions:  Continue current medications  If you need a refill on your cardiac medications before your next appointment, please call your pharmacy.  Labwork: None Ordered   Testing/Procedures: None Ordered  Follow-Up: You will need a follow up appointment in 6 months.  Please call our office 2 months in advance to schedule this appointment.  You may see [name], MD or one of the following Advanced Practice Providers on your designated Care Team: [place], [place] . Fabian Sharp, PA-C     At [facility], you and your health needs are our priority.  As part of our continuing mission to provide you with exceptional heart care, we have created designated Provider Care Teams.  These Care Teams include your primary Cardiologist (physician) and Advanced Practice Providers (APPs -  Physician Assistants and Nurse Practitioners) who all work together to provide you with the care you need, when you need it.  Thank you for choosing [facility] at [facility]!!

## 2019-03-25 NOTE — Telephone Encounter (Signed)
Left message for patient to call and schedule sleep appointment with Dr. Claiborne Billings pr 03/25/19 staff message from Meredith Pel, [place].  Appointment does not have to be on a Sleep Clinic day.

## 2019-03-26 NOTE — Telephone Encounter (Signed)
Left message for patient to call and schedule appointment with Dr. Claiborne Billings for sleep evaluation--does not have to be on a Sleep CLinic day.

## 2019-03-28 ENCOUNTER — Other Ambulatory Visit: Payer: Self-pay | Admitting: *Deleted

## 2019-03-28 MED ORDER — APIXABAN 5 MG PO TABS: 5.0000 mg | ORAL_TABLET | Freq: Two times a day (BID) | ORAL | 3 refills | Status: DC

## 2019-04-05 ENCOUNTER — Telehealth: Payer: Self-pay | Admitting: *Deleted

## 2019-04-05 NOTE — Telephone Encounter (Signed)
Patient assistance has been faxed for Eliquis

## 2019-05-14 NOTE — Telephone Encounter (Signed)
The patient's assistance has been denied due to it being covered by insurance.   The patient has been notified and verbalized her understanding.

## 2019-05-15 NOTE — Telephone Encounter (Signed)
Medication samples have been provided to the patient.  Drug name: Eliquis 5mg   Qty: 1 box  LOT: [id number]  Exp.Date: 07/2021  Samples left at front desk for patient pick-up. Patient notified.

## 2019-06-25 ENCOUNTER — Ambulatory Visit: Payer: Medicare Other | Admitting: Cardiovascular Disease

## 2019-06-25 ENCOUNTER — Encounter: Payer: Self-pay | Admitting: Cardiovascular Disease

## 2019-06-25 ENCOUNTER — Encounter

## 2019-06-25 ENCOUNTER — Other Ambulatory Visit: Payer: Self-pay

## 2019-06-25 VITALS — BP 157/96 | HR 79 | Ht 63.0 in | Wt 188.6 lb

## 2019-06-25 DIAGNOSIS — I480 Paroxysmal atrial fibrillation: Secondary | ICD-10-CM | POA: Diagnosis not present

## 2019-06-25 DIAGNOSIS — I5032 Chronic diastolic (congestive) heart failure: Secondary | ICD-10-CM

## 2019-06-25 DIAGNOSIS — G4733 Obstructive sleep apnea (adult) (pediatric): Secondary | ICD-10-CM

## 2019-06-25 DIAGNOSIS — I2510 Atherosclerotic heart disease of native coronary artery without angina pectoris: Secondary | ICD-10-CM

## 2019-06-25 DIAGNOSIS — E669 Obesity, unspecified: Secondary | ICD-10-CM

## 2019-06-25 DIAGNOSIS — Z7901 Long term (current) use of anticoagulants: Secondary | ICD-10-CM

## 2019-06-25 NOTE — Patient Instructions (Signed)
Follow-Up: IN February 2021In Person You may see [name], MD or one of the following Advanced Practice Providers on your designated Care Team:  Almyra Deforest, PA-C  Fabian Sharp, PA-C or Roby Lofts, PA-C.    FOLLOW UP AS-NEEDED FOR SLEEP  Medication Instructions:  The current medical regimen is effective;  continue present plan and medications as directed. Please refer to the Current Medication list given to you today. If you need a refill on your cardiac medications before your next appointment, please call your pharmacy.  At [facility], you and your health needs are our priority.  As part of our continuing mission to provide you with exceptional heart care, we have created designated Provider Care Teams.  These Care Teams include your primary Cardiologist (physician) and Advanced Practice Providers (APPs -  Physician Assistants and Nurse Practitioners) who all work together to provide you with the care you need, when you need it.  Thank you for choosing [facility] at [facility]!!

## 2019-06-25 NOTE — Progress Notes (Signed)
Cardiology Office Note    Date:  06/26/2019   ID:  [name] [name], DOB 1934/04/28, MRN [id number]  PCP:  [facility] [place]  Cardiologist:  Shelva Majestic, MD   Sleep evaluation  History of Present Illness:  [name] [name] is a 85 y.o. female who is followed by Dr. Sallyanne Kuster and had established care with him in May 2020 and a telemedicine evaluation.  Remotely the patient had been cared for by Dr. Loralie Champagne.  She has a history of CAD with DES stenting to her LAD in 2006.  Catheterization in 2010 showed a patent proximal LAD stent with 50% mid LAD stenosis.  An echo Doppler study in 2014 showed an EF of 60 to 65% with grade 2 diastolic dysfunction, mild AR, mildly dilated left atrium.  She has a history of prior breast cancer and status post surgery in 2000 followed by treatment with tamoxifen as well as a history of lumbar spine surgery and rotator cuff surgery.  There was also concern for possible sleep apnea.  She developed palpitations in March 2020 and was evaluated by Dr. Dwyane Dee at [facility] and was in atrial fibrillation.  Eliquis was initiated and she was started on aspirin and metoprolol.  She saw Dr. Sallyanne Kuster in a telemedicine evaluation in May 2000.  She presented to the hospital in July 2020 with increasing shortness of breath and chest tightness like a weight sitting on her chest.  She subsequently underwent cardiac catheterization and cardioversion.  Catheterization showed segmental 40 to 50% eccentric proximal LAD stenosis with a widely patent LAD stent is a consideration of diagonal 2 bifurcation stenosis that appeared 50% in most views.  She underwent successful DC cardioversion on March 15, 2019 with restoration of sinus rhythm.  She apparently had undergone a sleep study in [place] on November 10, 2018.  When the patient saw Bunnie Domino, NP in follow-up of her hospitalization, because of concern for sleep apnea she was referred to see me for further sleep evaluation.  I was  able to obtain the results of the diagnostic polysomnogram which was done at [facility] in [place] on November 08, 2018.  This revealed only very mild sleep apnea with an AHI of 5.7 events per hour.  She did not have significant oxygen desaturation and her lowest oxygen saturation level was 88%.  She did not have any events during REM sleep.  Apparently the person who interpreted the study felt she had mild sleep apnea and recommended either weight loss, dental appliance, surgery, or possible CPAP therapy.  The patient states she was never notified of the results.  She presents now for evaluation.  Presently she believes she is sleeping well.  She has rarely awakened gasping for breath.  She believes her sleep is restorative.  She denies any residual daytime sleepiness.  She does snore.  She denies hypnagogic hallucinations.  She is unaware of bruxism.  She denies sleep paralysis.   Epworth Sleepiness Scale: Situation   Chance of Dozing/Sleeping (0 = never , 1 = slight chance , 2 = moderate chance , 3 = high chance )   sitting and reading 1   watching TV 0   sitting inactive in a public place 0   being a passenger in a motor vehicle for an hour or more 0   lying down in the afternoon 0   sitting and talking to someone 0   sitting quietly after lunch (no alcohol) 0   while  stopped for a few minutes in traffic as the driver 0   Total Score  1   She presents for evaluation.  Past Medical History:  Diagnosis Date  . Anginal pain ([place])   . Anxiety   . Arthritis    "little in my thumbs"back & knees   . Breast cancer ([place])    R breast, treated with mastectomy/tomoxifen in early 2000s  . Bronchitis    hx of  . CAD (coronary artery disease)    The patient had a left heart catheterization in August 2006 showed an EF of 70%, a 70% mid LAD lesion and 80% distal LAD ledsion, as well as 50% ostial first diagonal lesion.  The patient did have a drug-eluting stent placed in her mid LAD  at that time.    . Chest pressure 03/13/2019  . Depression   . Diastolic heart failure    Echo 11/10 with EF 55-60%, moderate diastolic dysfunction, no regional WMAs, mild to moderate TR, moderate LAE, mild AI  . Frequent urination at night   . GERD (gastroesophageal reflux disease)    "gone since gallbladder took out"  . Heart murmur    "small"  . Hypercholesterolemia   . Hypertension   . Hypothyroidism   . Mental disorder   . Sleep disturbance    sleep study done- told that there wasn't any apnea concerns, states she is up & down for use of bathroom several times per night   . Type II diabetes mellitus ([place])    "controlled w/diet and exercise"  . Urinary incontinence     Past Surgical History:  Procedure Laterality Date  . [place] VITRECTOMY WITH 20 GAUGE MVR PORT Right 06/04/2013   Procedure: 25 GAUGE PARS PLANA VITRECTOMY WITH 20 GAUGE MVR PORT / REMOVAL SILICONE OIL RIGHT EYE. With Endolaser;  Surgeon: Hayden Pedro, MD;  Location: [place];  Service: Ophthalmology;  Laterality: Right;  . ABDOMINAL HYSTERECTOMY  1964  . Adenosine Myoview     9/09: EF 69% with normal perfusion images suggesting no ischemia or infarction.    . APPENDECTOMY  1954  . BACK SURGERY  06/2015   fusion-lumbar  . BREAST BIOPSY  2001   right  . CARDIAC CATHETERIZATION    . CARDIOVERSION N/A 03/15/2019   Procedure: CARDIOVERSION;  Surgeon: Fay Records, MD;  Location: [place];  Service: Cardiovascular;  Laterality: N/A;  . CATARACT EXTRACTION W/ INTRAOCULAR LENS  IMPLANT, BILATERAL  ~ 2000  . CHOLECYSTECTOMY  ~ 2004  . COLONOSCOPY    . CORONARY ANGIOPLASTY    . CORONARY ANGIOPLASTY WITH STENT PLACEMENT  2000's   "1"  . DILATION AND CURETTAGE OF UTERUS  1960's   "I had 3"  . EYE SURGERY     /iol    following cataract removal  . GAS/FLUID EXCHANGE Right 06/04/2013   Procedure: GAS/FLUID EXCHANGE;  Surgeon: Hayden Pedro, MD;  Location: [facility];  Service: Ophthalmology;   Laterality: Right;  . LEFT HEART CATH AND CORONARY ANGIOGRAPHY N/A 03/14/2019   Procedure: LEFT HEART CATH AND CORONARY ANGIOGRAPHY;  Surgeon: Belva Crome, MD;  Location: [facility];  Service: Cardiovascular;  Laterality: N/A;  . MASTECTOMY  2001   right breast  . MEMBRANE PEEL Right 06/04/2013   Procedure: MEMBRANE PEEL;  Surgeon: Hayden Pedro, MD;  Location: [facility];  Service: Ophthalmology;  Laterality: Right;  . NEUROPLASTY / TRANSPOSITION MEDIAN NERVE AT CARPAL TUNNEL BILATERAL  1990's  .  PARS PLANA VITRECTOMY  04/03/2012   right  . PARS PLANA VITRECTOMY  04/03/2012   Procedure: PARS PLANA VITRECTOMY WITH 25 GAUGE;  Surgeon: Hayden Pedro, MD;  Location: [facility];  Service: Ophthalmology;  Laterality: Right;  Silicone Oil, Perfluron, Repair Complex Traction Retinal Detachment  . PLANTAR FASCIA RELEASE  1990's   left  . TONSILLECTOMY AND ADENOIDECTOMY  1972  . TOTAL KNEE ARTHROPLASTY Left 10/13/2015   Procedure: TOTAL KNEE ARTHROPLASTY;  Surgeon: Melrose Nakayama, MD;  Location: [facility];  Service: Orthopedics;  Laterality: Left;  . TOTAL KNEE ARTHROPLASTY Right 09/20/2016   Procedure: TOTAL KNEE ARTHROPLASTY;  Surgeon: Melrose Nakayama, MD;  Location: [facility];  Service: Orthopedics;  Laterality: Right;  . ULNAR NERVE REPAIR     left arm    Current Medications: Outpatient Medications Prior to Visit  Medication Sig Dispense Refill  . apixaban (ELIQUIS) 5 MG TABS tablet Take 1 tablet (5 mg total) by mouth 2 (two) times daily. 180 tablet 3  . aspirin EC 81 MG tablet Take 81 mg by mouth daily.    Marland Kitchen atorvastatin (LIPITOR) 40 MG tablet Take 1 tablet (40 mg total) by mouth daily at 6 PM. 30 tablet 0  . calcium carbonate (OS-CAL) 600 MG TABS tablet Take 600 mg by mouth daily with breakfast.    . diltiazem (CARDIZEM CD) 180 MG 24 hr capsule Take 1 capsule (180 mg total) by mouth daily. 30 capsule 0  . furosemide (LASIX) 40 MG tablet Take 1 tablet (40 mg total) by mouth See admin instructions.  Take 1 tab (40 mg total) as needed for leg swelling, weight gain of more than 3 lbs in one day or 5 lbs in 1 week or SOB. 30 tablet 0  . isosorbide mononitrate (IMDUR) 120 MG 24 hr tablet Take 120 mg by mouth Daily.     Marland Kitchen levothyroxine (SYNTHROID) 100 MCG tablet Take 1 tablet (100 mcg total) by mouth daily. 30 tablet 0  . metoprolol succinate (TOPROL-XL) 50 MG 24 hr tablet Take 1 tablet (50 mg total) by mouth daily. Take with or immediately following a meal. 30 tablet 0  . traZODone (DESYREL) 150 MG tablet Take 225 mg by mouth at bedtime.     Marland Kitchen venlafaxine XR (EFFEXOR-XR) 150 MG 24 hr capsule Take 150 mg by mouth 2 (two) times a day.      No facility-administered medications prior to visit.      Allergies:   No known allergies   Social History   Socioeconomic History  . Marital status: Married    Spouse name: Not on file  . Number of children: Not on file  . Years of education: Not on file  . Highest education level: Not on file  Occupational History  . Occupation: Hotel manager: RETIRED    Comment: part-time  Social Needs  . Financial resource strain: Not on file  . Food insecurity    Worry: Not on file    Inability: Not on file  . Transportation needs    Medical: Not on file    Non-medical: Not on file  Tobacco Use  . Smoking status: Never Smoker  . Smokeless tobacco: Never Used  Substance and Sexual Activity  . Alcohol use: No  . Drug use: No  . Sexual activity: Not Currently  Lifestyle  . Physical activity    Days per week: Not on file    Minutes per session: Not on file  . Stress: Not  on file  Relationships  . Social Herbalist on phone: Not on file    Gets together: Not on file    Attends religious service: Not on file    Active member of club or organization: Not on file    Attends meetings of clubs or organizations: Not on file    Relationship status: Not on file  Other Topics Concern  . Not on file  Social History Narrative  .  Not on file     Family History:  The patient's family history includes Alcoholism in her father; Cancer in her brother and mother.   ROS General: Negative; No fevers, chills, or night sweats;  HEENT: Negative; No changes in vision or hearing, sinus congestion, difficulty swallowing Pulmonary: Negative; No cough, wheezing, shortness of breath, hemoptysis Cardiovascular: See HPI GI: Negative; No nausea, vomiting, diarrhea, or abdominal pain GU: Negative; No dysuria, hematuria, or difficulty voiding Musculoskeletal: Negative; no myalgias, joint pain, or weakness Hematologic/Oncology: Negative; no easy bruising, bleeding Endocrine: Negative; no heat/cold intolerance; no diabetes Neuro: Negative; no changes in balance, headaches Skin: Negative; No rashes or skin lesions Psychiatric: Negative; No behavioral problems, depression Sleep:  snoring, no daytime sleepiness, hypersomnolence, bruxism, restless legs, hypnogognic hallucinations, no cataplexy Other comprehensive 14 point system review is negative.   PHYSICAL EXAM:   VS:  BP (!) 157/96   Pulse 79   Ht _0  (1.6 m)   Wt 188 lb 9.6 oz (85.5 kg)   SpO2 96%   BMI 33.41 kg/m     Repeat blood pressure by me was 126/80  Wt Readings from Last 3 Encounters:  06/25/19 188 lb 9.6 oz (85.5 kg)  03/25/19 180 lb (81.6 kg)  03/16/19 182 lb 8 oz (82.8 kg)    General: Alert, oriented, no distress.  Skin: normal turgor, no rashes, warm and dry HEENT: Normocephalic, atraumatic. Pupils equal round and reactive to light; sclera anicteric; extraocular muscles intact;  Nose without nasal septal hypertrophy Mouth/Parynx benign; Mallinpatti scale 3 Neck: No JVD, no carotid bruits; normal carotid upstroke Lungs: clear to ausculatation and percussion; no wheezing or rales Chest wall: without tenderness to palpitation Heart: PMI not displaced, RRR, s1 s2 normal, 1/6 systolic murmur, no diastolic murmur, no rubs, gallops, thrills, or heaves Abdomen:  Mild central adiposity; soft, nontender; no hepatosplenomehaly, BS+; abdominal aorta nontender and not dilated by palpation. Back: no CVA tenderness Pulses 2+ Musculoskeletal: full range of motion, normal strength, no joint deformities Extremities: no clubbing cyanosis or edema, Homan's sign negative  Neurologic: grossly nonfocal; Cranial nerves grossly wnl Psychologic: Normal mood and affect   Studies/Labs Reviewed:   EKG:  EKG is ordered today.  ECG (independently read by me): Normal sinus rhythm at 79 bpm, right bundle branch block, left anterior hemiblock.  Recent Labs: BMP Latest Ref Rng & Units 03/14/2019 03/13/2019 03/12/2019  Glucose 70 - 99 mg/dL 98 117(H) 94  BUN 8 - 23 mg/dL _1 Creatinine 0.44 - 1.00 mg/dL 0.96 0.90 0.88  Sodium 135 - 145 mmol/L 140 137 139  Potassium 3.5 - 5.1 mmol/L 4.7 3.2(L) 3.9  Chloride 98 - 111 mmol/L 109 104 101  CO2 22 - 32 mmol/L 19(L) 23 24  Calcium 8.9 - 10.3 mg/dL 9.2 9.5 10.2     Hepatic Function Latest Ref Rng & Units 03/13/2019 06/25/2015 05/16/2011  Total Protein 6.5 - 8.1 g/dL 7.2 7.2 7.4  Albumin 3.5 - 5.0 g/dL 4.0 3.5 4.4  AST 15 - 41  U/L _0 ALT 0 - 44 U/L _1 Alk Phosphatase 38 - 126 U/L 69 77 68  Total Bilirubin 0.3 - 1.2 mg/dL 0.9 0.4 0.7  Bilirubin, Direct 0.0 - 0.3 mg/dL - - 0.1    CBC Latest Ref Rng & Units 03/16/2019 03/15/2019 03/14/2019  WBC 4.0 - 10.5 K/uL 5.7 6.2 7.9  Hemoglobin 12.0 - 15.0 g/dL 13.5 14.1 15.6(H)  Hematocrit 36.0 - 46.0 % 40.7 41.6 46.0  Platelets 150 - 400 K/uL 239 250 172   Lab Results  Component Value Date   MCV 94.2 03/16/2019   MCV 93.3 03/15/2019   MCV 93.5 03/14/2019   Lab Results  Component Value Date   TSH 7.938 (H) 03/12/2019   Lab Results  Component Value Date   HGBA1C 5.4 09/13/2016     BNP    Component Value Date/Time   BNP 635.2 (H) 03/13/2019 1018    ProBNP No results found for: PROBNP   Lipid Panel     Component Value Date/Time   CHOL 204 (H)  03/15/2019 0545   TRIG 128 03/15/2019 0545   HDL 51 03/15/2019 0545   CHOLHDL 4.0 03/15/2019 0545   VLDL 26 03/15/2019 0545   LDLCALC 127 (H) 03/15/2019 0545     RADIOLOGY: No results found.   Additional studies/ records that were reviewed today include:  I reviewed the patient's recent hospitalization records as well as office records.  I reviewed the diagnostic polysomnogram from [facility].   ASSESSMENT:    1. Mild OSA (obstructive sleep apnea)   2. Paroxysmal atrial fibrillation (HCC)   3. Coronary artery disease involving native coronary artery of native heart without angina pectoris   4. Chronic diastolic HF (heart failure) ([place])   5. Mild obesity   6. Anticoagulated     PLAN:  [name] [name] is a pleasant 85 year old female who has a history known CAD status post DES stenting 10/02/2004, type 2 diabetes mellitus, obesity, diastolic heart failure with normal EF and grade 2 diastolic dysfunction on echocardiography as well as recent atrial fibrillation and is status post cardioversion.  She has not had any recent anginal symptomatology since her hospitalization in July 2020 and repeat cardiac catheterization.  I was able to obtain her sleep study from [facility] which was done in March 2020.  I reviewed this study in detail with both she and her sister.  The present study demonstrates only very mild sleep apnea with an AHI of 5.7/h.  During that evaluation she did have prolonged latency to RAM sleep at 270 minutes and had reduction in rem sleep but still slept for 12% of the night and REM sleep.  She did not have any sleep apnea events during REM sleep.  Her oxygen nadir was 88%.  Presently, the patient believes she is sleeping well.  She is not symptomatic with daytime sleepiness.  Her Epworth Sleepiness Scale score today endorsed at 1.  She does have a history of snoring.  Presently we discussed the initial management with significant  attempt at weight loss and increased exercise.  Alternatively, she may be a candidate for a customized oral dental appliance particularly with her snoring which may lead to mandibular advancement and adequate therapy for her very mild sleep apnea.  Presently, I did not recommend definitive CPAP therapy however I have reserved the opportunity that this can be implemented in the future if she does note worsening symptomatology.  She  will return to the care of Dr. Sallyanne Kuster for her primary cardiology care.  I will be available on an as-needed basis from a sleep perspective if additional issues develop.   Medication Adjustments/Labs and Tests Ordered: Current medicines are reviewed at length with the patient today.  Concerns regarding medicines are outlined above.  Medication changes, Labs and Tests ordered today are listed in the Patient Instructions below. Patient Instructions  Follow-Up: IN February 2021In Person You may see [name], MD or one of the following Advanced Practice Providers on your designated Care Team:  Almyra Deforest, PA-C  Fabian Sharp, PA-C or Roby Lofts, PA-C.    FOLLOW UP AS-NEEDED FOR SLEEP  Medication Instructions:  The current medical regimen is effective;  continue present plan and medications as directed. Please refer to the Current Medication list given to you today. If you need a refill on your cardiac medications before your next appointment, please call your pharmacy.  At [facility], you and your health needs are our priority.  As part of our continuing mission to provide you with exceptional heart care, we have created designated Provider Care Teams.  These Care Teams include your primary Cardiologist (physician) and Advanced Practice Providers (APPs -  Physician Assistants and Nurse Practitioners) who all work together to provide you with the care you need, when you need it.  Thank you for choosing [facility] at [facility]!!          Signed, Shelva Majestic, MD  06/26/2019 1:34 PM    [facility] [address] [place], [place], [place]  [address] Phone: [phone]

## 2019-06-26 ENCOUNTER — Encounter: Payer: Self-pay | Admitting: Cardiovascular Disease

## 2019-09-27 ENCOUNTER — Ambulatory Visit: Payer: Medicare Other | Admitting: Cardiovascular Disease

## 2019-09-30 ENCOUNTER — Other Ambulatory Visit: Payer: Self-pay

## 2019-09-30 ENCOUNTER — Ambulatory Visit: Payer: Medicare Other | Attending: Internal Medicine

## 2019-09-30 DIAGNOSIS — Z23 Encounter for immunization: Secondary | ICD-10-CM | POA: Insufficient documentation

## 2019-09-30 NOTE — Progress Notes (Signed)
   Covid-19 Vaccination Clinic  Name:  [name] [name]    MRN: [id number] DOB: 1933/12/04  09/30/2019  [name] was observed post Covid-19 immunization for 15 minutes without incidence. She was provided with Vaccine Information Sheet and instruction to access the V-Safe system.   [name] was instructed to call 911 with any severe reactions post vaccine: Marland Kitchen Difficulty breathing  . Swelling of your face and throat  . A fast heartbeat  . A bad rash all over your body  . Dizziness and weakness    Immunizations Administered    Name Date Dose VIS Date Route   Pfizer COVID-19 Vaccine 09/30/2019 11:32 AM 0.3 mL 07/26/2019 Intramuscular   Manufacturer: [place]   Lot: [id number]   [place]: [id number]

## 2019-10-08 ENCOUNTER — Telehealth: Payer: Self-pay | Admitting: Cardiovascular Disease

## 2019-10-08 NOTE — Telephone Encounter (Signed)
New Message   Pt is requesting for her daughter Rodella Loper to come in with her on her appointment on 10/11/19. She said that she doesn't remember well and don't hear well.  please advise

## 2019-10-08 NOTE — Telephone Encounter (Signed)
Returned call to patient-advised ok to come to appt.  Patient states she cannot remember things well.

## 2019-10-11 ENCOUNTER — Ambulatory Visit: Payer: Medicare Other | Admitting: Cardiovascular Disease

## 2019-10-11 ENCOUNTER — Other Ambulatory Visit: Payer: Self-pay

## 2019-10-11 ENCOUNTER — Encounter: Payer: Self-pay | Admitting: Cardiovascular Disease

## 2019-10-11 VITALS — BP 110/80 | HR 89 | Ht 63.0 in | Wt 189.0 lb

## 2019-10-11 DIAGNOSIS — I2510 Atherosclerotic heart disease of native coronary artery without angina pectoris: Secondary | ICD-10-CM | POA: Diagnosis not present

## 2019-10-11 DIAGNOSIS — I480 Paroxysmal atrial fibrillation: Secondary | ICD-10-CM

## 2019-10-11 DIAGNOSIS — E119 Type 2 diabetes mellitus without complications: Secondary | ICD-10-CM

## 2019-10-11 DIAGNOSIS — E7800 Pure hypercholesterolemia, unspecified: Secondary | ICD-10-CM

## 2019-10-11 DIAGNOSIS — E668 Other obesity: Secondary | ICD-10-CM

## 2019-10-11 DIAGNOSIS — E039 Hypothyroidism, unspecified: Secondary | ICD-10-CM

## 2019-10-11 DIAGNOSIS — I10 Essential (primary) hypertension: Secondary | ICD-10-CM

## 2019-10-11 DIAGNOSIS — E669 Obesity, unspecified: Secondary | ICD-10-CM

## 2019-10-11 DIAGNOSIS — G4733 Obstructive sleep apnea (adult) (pediatric): Secondary | ICD-10-CM

## 2019-10-11 DIAGNOSIS — I5033 Acute on chronic diastolic (congestive) heart failure: Secondary | ICD-10-CM

## 2019-10-11 MED ORDER — SPIRONOLACTONE 25 MG PO TABS: 25.0000 mg | ORAL_TABLET | Freq: Every day | ORAL | 3 refills | Status: DC

## 2019-10-11 MED ORDER — FUROSEMIDE 40 MG PO TABS: 40.0000 mg | ORAL_TABLET | Freq: Every day | ORAL | 3 refills | Status: DC

## 2019-10-11 NOTE — Patient Instructions (Signed)
Medication Instructions:  FUROSEMIDE: Take 40 mg once daily SPIRONOLACTONE: Take 25 mg once daily STOP the Hydrochlorothiazide   *If you need a refill on your cardiac medications before your next appointment, please call your pharmacy*   Lab Work: None ordered If you have labs (blood work) drawn today and your tests are completely normal, you will receive your results only by: Marland Kitchen MyChart Message (if you have MyChart) OR . A paper copy in the mail If you have any lab test that is abnormal or we need to change your treatment, we will call you to review the results.   Testing/Procedures: None ordered   Follow-Up: At [facility], you and your health needs are our priority.  As part of our continuing mission to provide you with exceptional heart care, we have created designated Provider Care Teams.  These Care Teams include your primary Cardiologist (physician) and Advanced Practice Providers (APPs -  Physician Assistants and Nurse Practitioners) who all work together to provide you with the care you need, when you need it.  We recommend signing up for the patient portal called "MyChart".  Sign up information is provided on this After Visit Summary.  MyChart is used to connect with patients for Virtual Visits (Telemedicine).  Patients are able to view lab/test results, encounter notes, upcoming appointments, etc.  Non-urgent messages can be sent to your provider as well.   To learn more about what you can do with MyChart, go to [email or web address].    Your next appointment:   Follow up in one month with an APP Follow up in 3-4 months with Dr. Sallyanne Kuster.   Other Instructions Your physician recommends that you weigh yourself everyday at the same time, on the same scale and with the same amount of clothing. Please keep a record of these weights and bring to your next appointment.

## 2019-10-12 ENCOUNTER — Encounter: Payer: Self-pay | Admitting: Cardiovascular Disease

## 2019-10-12 NOTE — Progress Notes (Signed)
Cardiology Office Note:    Date:  10/12/2019   ID:  [name] [name], DOB [birth year], MRN [id number]  PCP:  [facility], [place]  Cardiologist:  [name], MD  Electrophysiologist:  None   Referring MD: [facility], [facility] Complaint  Patient presents with  . Congestive Heart Failure  . Atrial Fibrillation  . Sleep Apnea    History of Present Illness:    [name] [name] is a 86 y.o. female with a hx of chronic diastolic HF, CAD (mid LAD stent 2006, distal LAD 80%, no progression 2010), paroxysmal atrial fibrillation, HTN, OSA (mild, AHI 5.7/min), moderate obesity, diet controlled DM.  Continues to have similar problems with dyspnea with mild exercise (OK getting dressed, occasional dyspnea just walking in house, has to stop to get to mailbox). Has mild ankle edema when she wakes, moderate by evening. Denies angina and palpitations.No syncope or focal neuro complaints. No falls or bleeding.  She is tired all the time and naps throughout the day. OSA was mild and CPAP not initially recommended, but left as an option if she is symptomatic.  Past Medical History:  Diagnosis Date  . Anginal pain ([place])   . Anxiety   . Arthritis    "little in my thumbs"back & knees   . Breast cancer ([place])    R breast, treated with mastectomy/tomoxifen in early 2000s  . Bronchitis    hx of  . CAD (coronary artery disease)    The patient had a left heart catheterization in August 2006 showed an EF of 70%, a 70% mid LAD lesion and 80% distal LAD ledsion, as well as 50% ostial first diagonal lesion.  The patient did have a drug-eluting stent placed in her mid LAD at that time.    . Chest pressure 03/13/2019  . Depression   . Diastolic heart failure    Echo 11/10 with EF 55-60%, moderate diastolic dysfunction, no regional WMAs, mild to moderate TR, moderate LAE, mild AI  . Frequent urination at night   . GERD (gastroesophageal reflux disease)    "gone since gallbladder took out"  . Heart murmur     "small"  . Hypercholesterolemia   . Hypertension   . Hypothyroidism   . Mental disorder   . Sleep disturbance    sleep study done- told that there wasn't any apnea concerns, states she is up & down for use of bathroom several times per night   . Type II diabetes mellitus ([place])    "controlled w/diet and exercise"  . Urinary incontinence     Past Surgical History:  Procedure Laterality Date  . [place] VITRECTOMY WITH 20 GAUGE MVR PORT Right 06/04/2013   Procedure: 25 GAUGE PARS PLANA VITRECTOMY WITH 20 GAUGE MVR PORT / REMOVAL SILICONE OIL RIGHT EYE. With Endolaser;  Surgeon: Hayden Pedro, MD;  Location: [place];  Service: Ophthalmology;  Laterality: Right;  . ABDOMINAL HYSTERECTOMY  1964  . Adenosine Myoview     9/09: EF 69% with normal perfusion images suggesting no ischemia or infarction.    . APPENDECTOMY  1954  . BACK SURGERY  06/2015   fusion-lumbar  . BREAST BIOPSY  2001   right  . CARDIAC CATHETERIZATION    . CARDIOVERSION N/A 03/15/2019   Procedure: CARDIOVERSION;  Surgeon: Fay Records, MD;  Location: [place];  Service: Cardiovascular;  Laterality: N/A;  . CATARACT EXTRACTION W/ INTRAOCULAR LENS  IMPLANT, BILATERAL  ~ 2000  . CHOLECYSTECTOMY  ~  2004  . COLONOSCOPY    . CORONARY ANGIOPLASTY    . CORONARY ANGIOPLASTY WITH STENT PLACEMENT  2000's   "1"  . DILATION AND CURETTAGE OF UTERUS  1960's   "I had 3"  . EYE SURGERY     /iol    following cataract removal  . GAS/FLUID EXCHANGE Right 06/04/2013   Procedure: GAS/FLUID EXCHANGE;  Surgeon: Hayden Pedro, MD;  Location: [place];  Service: Ophthalmology;  Laterality: Right;  . LEFT HEART CATH AND CORONARY ANGIOGRAPHY N/A 03/14/2019   Procedure: LEFT HEART CATH AND CORONARY ANGIOGRAPHY;  Surgeon: Belva Crome, MD;  Location: [place] [facility];  Service: Cardiovascular;  Laterality: N/A;  . MASTECTOMY  2001   right breast  . MEMBRANE PEEL Right 06/04/2013   Procedure: MEMBRANE PEEL;  Surgeon: Hayden Pedro, MD;  Location: [place];  Service: Ophthalmology;  Laterality: Right;  . NEUROPLASTY / TRANSPOSITION MEDIAN NERVE AT CARPAL TUNNEL BILATERAL  1990's  . PARS PLANA VITRECTOMY  04/03/2012   right  . PARS PLANA VITRECTOMY  04/03/2012   Procedure: PARS PLANA VITRECTOMY WITH 25 GAUGE;  Surgeon: Hayden Pedro, MD;  Location: [place];  Service: Ophthalmology;  Laterality: Right;  Silicone Oil, Perfluron, Repair Complex Traction Retinal Detachment  . PLANTAR FASCIA RELEASE  1990's   left  . TONSILLECTOMY AND ADENOIDECTOMY  1972  . TOTAL KNEE ARTHROPLASTY Left 10/13/2015   Procedure: TOTAL KNEE ARTHROPLASTY;  Surgeon: Melrose Nakayama, MD;  Location: [place];  Service: Orthopedics;  Laterality: Left;  . TOTAL KNEE ARTHROPLASTY Right 09/20/2016   Procedure: TOTAL KNEE ARTHROPLASTY;  Surgeon: Melrose Nakayama, MD;  Location: [place];  Service: Orthopedics;  Laterality: Right;  . ULNAR NERVE REPAIR     left arm    Current Medications: Current Meds  Medication Sig  . apixaban (ELIQUIS) 5 MG TABS tablet Take 1 tablet (5 mg total) by mouth 2 (two) times daily.  Marland Kitchen atorvastatin (LIPITOR) 40 MG tablet Take 1 tablet (40 mg total) by mouth daily at 6 PM.  . calcium carbonate (OS-CAL) 600 MG TABS tablet Take 600 mg by mouth daily with breakfast.  . diltiazem (CARDIZEM CD) 180 MG 24 hr capsule Take 1 capsule (180 mg total) by mouth daily.  . furosemide (LASIX) 40 MG tablet Take 1 tablet (40 mg total) by mouth daily.  . isosorbide mononitrate (IMDUR) 120 MG 24 hr tablet Take 120 mg by mouth Daily.   Marland Kitchen levothyroxine (SYNTHROID) 75 MCG tablet Take 75 mcg by mouth daily.  Marland Kitchen losartan (COZAAR) 100 MG tablet Take 100 mg by mouth daily.  . metoprolol succinate (TOPROL-XL) 50 MG 24 hr tablet Take 1 tablet (50 mg total) by mouth daily. Take with or immediately following a meal.  . traZODone (DESYREL) 150 MG tablet Take 225 mg by mouth at bedtime.   Marland Kitchen venlafaxine XR (EFFEXOR-XR) 150 MG 24 hr capsule Take 150 mg by mouth 2  (two) times a day.   . [DISCONTINUED] furosemide (LASIX) 40 MG tablet Take 1 tablet (40 mg total) by mouth See admin instructions. Take 1 tab (40 mg total) as needed for leg swelling, weight gain of more than 3 lbs in one day or 5 lbs in 1 week or SOB.  . [DISCONTINUED] hydrochlorothiazide (MICROZIDE) 12.5 MG capsule Take 12.5 mg by mouth daily.     Allergies:   No known allergies   Social History   Socioeconomic History  . Marital status: Married    Spouse name: Not  on file  . Number of children: Not on file  . Years of education: Not on file  . Highest education level: Not on file  Occupational History  . Occupation: Hotel manager: RETIRED    Comment: part-time  Tobacco Use  . Smoking status: Never Smoker  . Smokeless tobacco: Never Used  Substance and Sexual Activity  . Alcohol use: No  . Drug use: No  . Sexual activity: Not Currently  Other Topics Concern  . Not on file  Social History Narrative  . Not on file   Social Determinants of Health   Financial Resource Strain:   . Difficulty of Paying Living Expenses: Not on file  Food Insecurity:   . Worried About Charity fundraiser in the Last Year: Not on file  . Ran Out of Food in the Last Year: Not on file  Transportation Needs:   . Lack of Transportation (Medical): Not on file  . Lack of Transportation (Non-Medical): Not on file  Physical Activity:   . Days of Exercise per Week: Not on file  . Minutes of Exercise per Session: Not on file  Stress:   . Feeling of Stress : Not on file  Social Connections:   . Frequency of Communication with Friends and Family: Not on file  . Frequency of Social Gatherings with Friends and Family: Not on file  . Attends Religious Services: Not on file  . Active Member of Clubs or Organizations: Not on file  . Attends Archivist Meetings: Not on file  . Marital Status: Not on file     Family History: The patient's family history includes Alcoholism in  her father; Cancer in her brother and mother.  ROS:   Please see the history of present illness.     All other systems reviewed and are negative.  EKGs/Labs/Other Studies Reviewed:    The following studies were reviewed today: Notes from Dr. Claiborne Billings sleep clinic appt  EKG:  EKG is not ordered today.  The ekg ordered 06/25/2019 demonstrates NSR, RBBB+LAFB  Recent Labs: 03/12/2019: Magnesium 2.2; TSH 7.938 03/13/2019: ALT 21; B Natriuretic Peptide 635.2 03/14/2019: BUN 14; Creatinine, Ser 0.96; Potassium 4.7; Sodium 140 03/16/2019: Hemoglobin 13.5; Platelets 239  Recent Lipid Panel    Component Value Date/Time   CHOL 204 (H) 03/15/2019 0545   TRIG 128 03/15/2019 0545   HDL 51 03/15/2019 0545   CHOLHDL 4.0 03/15/2019 0545   VLDL 26 03/15/2019 0545   LDLCALC 127 (H) 03/15/2019 0545    Physical Exam:    VS:  BP 110/80   Pulse 89   Ht 5\' 3"  (1.6 m)   Wt 189 lb (85.7 kg)   SpO2 96%   BMI 33.48 kg/m     Wt Readings from Last 3 Encounters:  10/11/19 189 lb (85.7 kg)  06/25/19 188 lb 9.6 oz (85.5 kg)  03/25/19 180 lb (81.6 kg)     GEN: Obese Well nourished, well developed in no acute distress HEENT: Normal NECK: No JVD; No carotid bruits LYMPHATICS: No lymphadenopathy CARDIAC: Widely split S2, RRR, no murmurs, rubs, gallops RESPIRATORY:  Clear to auscultation without rales, wheezing or rhonchi  ABDOMEN: Soft, non-tender, non-distended MUSCULOSKELETAL:  No edema; No deformity  SKIN: Warm and dry NEUROLOGIC:  Alert and oriented x 3 PSYCHIATRIC:  Normal affect   ASSESSMENT:    1. Acute on chronic diastolic heart failure (HCC)   2. Paroxysmal atrial fibrillation (HCC)   3. Atherosclerosis of native  coronary artery of native heart without angina pectoris   4. Type 2 diabetes, diet controlled ([place])   5. Pure hypercholesterolemia   6. Acquired hypothyroidism   7. Essential hypertension   8. Moderate obesity   9. OSA (obstructive sleep apnea)    PLAN:    In order of  problems listed above:  1. CHF: reviewed sodium restriction and daily weights. Stop HCTZ, start furosemide and spironolactone. Daily weight log, target initial "dry weight" under 180 lb. Sodium restriction reviewed. Recheck labs at f/u appt. 2. AFib:  Currently in NSR. On anticoagulation, no bleeding complications. CHADSVasc 7 (age 77, gender, HF, CAD, DM, HTN). On both diltiazem and metoprolol for rate control of breakthrough events. 3. CAD: denies angina. Single vessel disease at cath 2006 (stent LAD) and 2010 (no PCI). ASA stopped due to anticoagulation. On statin nd beta blocker. 4. DM: diet controlled, no recent A1c(5.5% in June 2020) . Target LDL<70. 5. HLP:no recent labs available. Continue statin and get labs from PCP. In 2015-2016, LDL was 61-77, but most recent LDL in 02/2019 was high (maybe statin was stopped at the time?) 6. HypoT: TSH mildly elevated last year. 7. HTN: controled, hope that she will not be dizzy with spironolactone. If that occurs, reduce the losartan.  8. OSA: weight loss would help. She has daytime hypersomnolence. Mandibular advancement device would not work since she is virtually edentulous. Will schedule for CPAP titration.   Medication Adjustments/Labs and Tests Ordered: Current medicines are reviewed at length with the patient today.  Concerns regarding medicines are outlined above.  No orders of the defined types were placed in this encounter.  Meds ordered this encounter  Medications  . furosemide (LASIX) 40 MG tablet    Sig: Take 1 tablet (40 mg total) by mouth daily.    Dispense:  30 tablet    Refill:  3  . spironolactone (ALDACTONE) 25 MG tablet    Sig: Take 1 tablet (25 mg total) by mouth daily.    Dispense:  30 tablet    Refill:  3    Patient Instructions  Medication Instructions:  FUROSEMIDE: Take 40 mg once daily SPIRONOLACTONE: Take 25 mg once daily STOP the Hydrochlorothiazide   *If you need a refill on your cardiac medications before  your next appointment, please call your pharmacy*   Lab Work: None ordered If you have labs (blood work) drawn today and your tests are completely normal, you will receive your results only by: Marland Kitchen MyChart Message (if you have MyChart) OR . A paper copy in the mail If you have any lab test that is abnormal or we need to change your treatment, we will call you to review the results.   Testing/Procedures: None ordered   Follow-Up: At [facility], you and your health needs are our priority.  As part of our continuing mission to provide you with exceptional heart care, we have created designated Provider Care Teams.  These Care Teams include your primary Cardiologist (physician) and Advanced Practice Providers (APPs -  Physician Assistants and Nurse Practitioners) who all work together to provide you with the care you need, when you need it.  We recommend signing up for the patient portal called "MyChart".  Sign up information is provided on this After Visit Summary.  MyChart is used to connect with patients for Virtual Visits (Telemedicine).  Patients are able to view lab/test results, encounter notes, upcoming appointments, etc.  Non-urgent messages can be sent to your provider as well.  To learn more about what you can do with MyChart, go to [email or web address].    Your next appointment:   Follow up in one month with an APP Follow up in 3-4 months with Dr. Sallyanne Kuster.   Other Instructions Your physician recommends that you weigh yourself everyday at the same time, on the same scale and with the same amount of clothing. Please keep a record of these weights and bring to your next appointment.      Signed, [name], MD  10/12/2019 8:01 PM    [facility]

## 2019-10-22 ENCOUNTER — Telehealth: Payer: Self-pay | Admitting: *Deleted

## 2019-10-22 NOTE — Telephone Encounter (Signed)
Notified patient and daughter per Dr Claiborne Billings he will be ordering a CPAP auto for her instead of CPAP titration study. Request for auto CPAP has been sent to [facility].

## 2019-10-30 ENCOUNTER — Ambulatory Visit: Payer: Medicare Other | Attending: Internal Medicine

## 2019-10-30 DIAGNOSIS — Z23 Encounter for immunization: Secondary | ICD-10-CM

## 2019-10-30 NOTE — Progress Notes (Signed)
   Covid-19 Vaccination Clinic  Name:  [name] [name]    MRN: [id number] DOB: May 24, 1934  10/30/2019  [name] [name] was observed post Covid-19 immunization for 15 minutes without incident. She was provided with Vaccine Information Sheet and instruction to access the V-Safe system.   [name] [name] was instructed to call 911 with any severe reactions post vaccine: Marland Kitchen Difficulty breathing  . Swelling of face and throat  . A fast heartbeat  . A bad rash all over body  . Dizziness and weakness   Immunizations Administered    Name Date Dose VIS Date Route   Pfizer COVID-19 Vaccine 10/30/2019 11:11 AM 0.3 mL 07/26/2019 Intramuscular   Manufacturer: [place]   Lot: [id number]   [place]: [id number]

## 2019-11-08 ENCOUNTER — Ambulatory Visit: Payer: Medicare Other | Admitting: Cardiology

## 2019-11-08 ENCOUNTER — Encounter: Payer: Self-pay | Admitting: Cardiology

## 2019-11-08 ENCOUNTER — Other Ambulatory Visit: Payer: Self-pay

## 2019-11-08 VITALS — BP 140/80 | HR 73 | Ht 63.0 in | Wt 186.8 lb

## 2019-11-08 DIAGNOSIS — I480 Paroxysmal atrial fibrillation: Secondary | ICD-10-CM

## 2019-11-08 DIAGNOSIS — I10 Essential (primary) hypertension: Secondary | ICD-10-CM | POA: Diagnosis not present

## 2019-11-08 DIAGNOSIS — I5032 Chronic diastolic (congestive) heart failure: Secondary | ICD-10-CM

## 2019-11-08 DIAGNOSIS — I2510 Atherosclerotic heart disease of native coronary artery without angina pectoris: Secondary | ICD-10-CM

## 2019-11-08 DIAGNOSIS — R0600 Dyspnea, unspecified: Secondary | ICD-10-CM | POA: Diagnosis not present

## 2019-11-08 DIAGNOSIS — Z9861 Coronary angioplasty status: Secondary | ICD-10-CM

## 2019-11-08 DIAGNOSIS — R0609 Other forms of dyspnea: Secondary | ICD-10-CM | POA: Insufficient documentation

## 2019-11-08 DIAGNOSIS — G4730 Sleep apnea, unspecified: Secondary | ICD-10-CM

## 2019-11-08 DIAGNOSIS — Z7901 Long term (current) use of anticoagulants: Secondary | ICD-10-CM | POA: Insufficient documentation

## 2019-11-08 NOTE — Assessment & Plan Note (Signed)
CHADS VASC=7

## 2019-11-08 NOTE — Assessment & Plan Note (Signed)
Controlled- normal LVF and grade 2 DD on echo

## 2019-11-08 NOTE — Progress Notes (Signed)
Cardiology Office Note:    Date:  11/08/2019   ID:  [name] [name], DOB 1933/12/25, MRN [id number]  PCP:  [facility] [place]  Cardiologist:  [name], MD  Electrophysiologist:  None   Referring MD: [place]   No chief complaint on file.   History of Present Illness:    [name] [name] is a 86 y.o. female from [facility] with a hx of chronic diastolic heart failure, CAD status post LAD PCI in 2006.  This site was patent at cath July 2020.  She has had PAF in March 2020 [facility]). She is on chronic AC.  She was in normal sinus rhythm at her last office visit. Other problems include hypertension, diet-controlled diabetes, and sleep apnea.  She was recently prescribed CPAP which she has not started yet.    The patient saw Dr. Sallyanne Kuster in the office in February 2021.  He adjusted her diuretic, stopping HCTZ and adding Lasix and Aldactone.  He suggested a target weight of close to180 pounds.  The patient is seen today in follow-up.  She says she is doing pretty well.  She has no lower extremity edema.  She has significant arthritis in her knees and back.  She does walk to the mailbox once or twice a day.  She had a CPAP titration study since her last office visit and is due to pick up CPAP device on Monday.  Her weight today is 186 pounds.  Past Medical History:  Diagnosis Date  . Anginal pain ([place])   . Anxiety   . Arthritis    "little in my thumbs"back & knees   . Breast cancer ([place])    R breast, treated with mastectomy/tomoxifen in early 2000s  . Bronchitis    hx of  . CAD (coronary artery disease)    The patient had a left heart catheterization in August 2006 showed an EF of 70%, a 70% mid LAD lesion and 80% distal LAD ledsion, as well as 50% ostial first diagonal lesion.  The patient did have a drug-eluting stent placed in her mid LAD at that time.    . Chest pressure 03/13/2019  . Depression   . Diastolic heart failure    Echo 11/10 with EF 55-60%, moderate diastolic  dysfunction, no regional WMAs, mild to moderate TR, moderate LAE, mild AI  . Frequent urination at night   . GERD (gastroesophageal reflux disease)    "gone since gallbladder took out"  . Heart murmur    "small"  . Hypercholesterolemia   . Hypertension   . Hypothyroidism   . Mental disorder   . Sleep disturbance    sleep study done- told that there wasn't any apnea concerns, states she is up & down for use of bathroom several times per night   . Type II diabetes mellitus ([place])    "controlled w/diet and exercise"  . Urinary incontinence     Past Surgical History:  Procedure Laterality Date  . [place] VITRECTOMY WITH 20 GAUGE MVR PORT Right 06/04/2013   Procedure: 25 GAUGE PARS PLANA VITRECTOMY WITH 20 GAUGE MVR PORT / REMOVAL SILICONE OIL RIGHT EYE. With Endolaser;  Surgeon: Hayden Pedro, MD;  Location: [place];  Service: Ophthalmology;  Laterality: Right;  . ABDOMINAL HYSTERECTOMY  1964  . Adenosine Myoview     9/09: EF 69% with normal perfusion images suggesting no ischemia or infarction.    . APPENDECTOMY  1954  . BACK SURGERY  06/2015  fusion-lumbar  . BREAST BIOPSY  2001   right  . CARDIAC CATHETERIZATION    . CARDIOVERSION N/A 03/15/2019   Procedure: CARDIOVERSION;  Surgeon: Fay Records, MD;  Location: [place];  Service: Cardiovascular;  Laterality: N/A;  . CATARACT EXTRACTION W/ INTRAOCULAR LENS  IMPLANT, BILATERAL  ~ 2000  . CHOLECYSTECTOMY  ~ 2004  . COLONOSCOPY    . CORONARY ANGIOPLASTY    . CORONARY ANGIOPLASTY WITH STENT PLACEMENT  2000's   "1"  . DILATION AND CURETTAGE OF UTERUS  1960's   "I had 3"  . EYE SURGERY     /iol    following cataract removal  . GAS/FLUID EXCHANGE Right 06/04/2013   Procedure: GAS/FLUID EXCHANGE;  Surgeon: Hayden Pedro, MD;  Location: [place];  Service: Ophthalmology;  Laterality: Right;  . LEFT HEART CATH AND CORONARY ANGIOGRAPHY N/A 03/14/2019   Procedure: LEFT HEART CATH AND CORONARY ANGIOGRAPHY;  Surgeon:  Belva Crome, MD;  Location: [place] [facility];  Service: Cardiovascular;  Laterality: N/A;  . MASTECTOMY  2001   right breast  . MEMBRANE PEEL Right 06/04/2013   Procedure: MEMBRANE PEEL;  Surgeon: Hayden Pedro, MD;  Location: [place];  Service: Ophthalmology;  Laterality: Right;  . NEUROPLASTY / TRANSPOSITION MEDIAN NERVE AT CARPAL TUNNEL BILATERAL  1990's  . PARS PLANA VITRECTOMY  04/03/2012   right  . PARS PLANA VITRECTOMY  04/03/2012   Procedure: PARS PLANA VITRECTOMY WITH 25 GAUGE;  Surgeon: Hayden Pedro, MD;  Location: [place];  Service: Ophthalmology;  Laterality: Right;  Silicone Oil, Perfluron, Repair Complex Traction Retinal Detachment  . PLANTAR FASCIA RELEASE  1990's   left  . TONSILLECTOMY AND ADENOIDECTOMY  1972  . TOTAL KNEE ARTHROPLASTY Left 10/13/2015   Procedure: TOTAL KNEE ARTHROPLASTY;  Surgeon: Melrose Nakayama, MD;  Location: [place];  Service: Orthopedics;  Laterality: Left;  . TOTAL KNEE ARTHROPLASTY Right 09/20/2016   Procedure: TOTAL KNEE ARTHROPLASTY;  Surgeon: Melrose Nakayama, MD;  Location: [place];  Service: Orthopedics;  Laterality: Right;  . ULNAR NERVE REPAIR     left arm    Current Medications: Current Meds  Medication Sig  . apixaban (ELIQUIS) 5 MG TABS tablet Take 1 tablet (5 mg total) by mouth 2 (two) times daily.  Marland Kitchen atorvastatin (LIPITOR) 40 MG tablet Take 1 tablet (40 mg total) by mouth daily at 6 PM.  . calcium carbonate (OS-CAL) 600 MG TABS tablet Take 600 mg by mouth daily with breakfast.  . diltiazem (CARDIZEM CD) 180 MG 24 hr capsule Take 1 capsule (180 mg total) by mouth daily.  . furosemide (LASIX) 40 MG tablet Take 1 tablet (40 mg total) by mouth daily.  . isosorbide mononitrate (IMDUR) 120 MG 24 hr tablet Take 120 mg by mouth Daily.   Marland Kitchen levothyroxine (SYNTHROID) 75 MCG tablet Take 75 mcg by mouth daily.  Marland Kitchen losartan (COZAAR) 100 MG tablet Take 100 mg by mouth daily.  . metoprolol succinate (TOPROL-XL) 50 MG 24 hr tablet Take 1 tablet (50 mg  total) by mouth daily. Take with or immediately following a meal.  . spironolactone (ALDACTONE) 25 MG tablet Take 1 tablet (25 mg total) by mouth daily.  . traZODone (DESYREL) 150 MG tablet Take 225 mg by mouth at bedtime.   Marland Kitchen venlafaxine XR (EFFEXOR-XR) 150 MG 24 hr capsule Take 150 mg by mouth 2 (two) times a day.      Allergies:   No known allergies   Social History  Socioeconomic History  . Marital status: Married    Spouse name: Not on file  . Number of children: Not on file  . Years of education: Not on file  . Highest education level: Not on file  Occupational History  . Occupation: Hotel manager: RETIRED    Comment: part-time  Tobacco Use  . Smoking status: Never Smoker  . Smokeless tobacco: Never Used  Substance and Sexual Activity  . Alcohol use: No  . Drug use: No  . Sexual activity: Not Currently  Other Topics Concern  . Not on file  Social History Narrative  . Not on file   Social Determinants of Health   Financial Resource Strain:   . Difficulty of Paying Living Expenses:   Food Insecurity:   . Worried About Charity fundraiser in the Last Year:   . Arboriculturist in the Last Year:   Transportation Needs:   . Film/video editor (Medical):   Marland Kitchen Lack of Transportation (Non-Medical):   Physical Activity:   . Days of Exercise per Week:   . Minutes of Exercise per Session:   Stress:   . Feeling of Stress :   Social Connections:   . Frequency of Communication with Friends and Family:   . Frequency of Social Gatherings with Friends and Family:   . Attends Religious Services:   . Active Member of Clubs or Organizations:   . Attends Archivist Meetings:   Marland Kitchen Marital Status:      Family History: The patient's family history includes Alcoholism in her father; Cancer in her brother and mother.  ROS:   Please see the history of present illness.     All other systems reviewed and are negative.  EKGs/Labs/Other Studies  Reviewed:    The following studies were reviewed today:   EKG:  EKG is not ordered today.  The ekg ordered 07/05/2019 demonstrates NSR, RBBB, LAFB.   Recent Labs: 03/12/2019: Magnesium 2.2; TSH 7.938 03/13/2019: ALT 21; B Natriuretic Peptide 635.2 03/14/2019: BUN 14; Creatinine, Ser 0.96; Potassium 4.7; Sodium 140 03/16/2019: Hemoglobin 13.5; Platelets 239  Recent Lipid Panel    Component Value Date/Time   CHOL 204 (H) 03/15/2019 0545   TRIG 128 03/15/2019 0545   HDL 51 03/15/2019 0545   CHOLHDL 4.0 03/15/2019 0545   VLDL 26 03/15/2019 0545   LDLCALC 127 (H) 03/15/2019 0545    Physical Exam:    VS:  BP 140/80   Pulse 73   Ht 5\' 3"  (1.6 m)   Wt 186 lb 12.8 oz (84.7 kg)   SpO2 92%   BMI 33.09 kg/m     Wt Readings from Last 3 Encounters:  11/08/19 186 lb 12.8 oz (84.7 kg)  10/11/19 189 lb (85.7 kg)  06/25/19 188 lb 9.6 oz (85.5 kg)     GEN:  Well nourished, well developed in no acute distress HEENT: Normal NECK: No JVD; No carotid bruits CARDIAC: RRR, no murmurs, rubs, gallops RESPIRATORY:  Clear to auscultation without rales, wheezing or rhonchi  ABDOMEN: Soft, non-tender, non-distended MUSCULOSKELETAL:  No edema; No deformity  SKIN: Warm and dry NEUROLOGIC:  Alert and oriented x 3 PSYCHIATRIC:  Normal affect   ASSESSMENT:    Chronic diastolic HF (heart failure) (HCC) Currently compensated by exam and history- check BMP since diuretics adjusted in Feb  CAD S/P PCI LAD PCI-'06-patent 2010 and July 2020  Chronic anticoagulation CHADS VASC=7  PAF (paroxysmal atrial fibrillation) (HCC) NSR on  exam  Sleep apnea C-pap to start at home next week  Essential hypertension Controlled- normal LVF and grade 2 DD on echo  PLAN:    Same Rx- check BMP- keep f/u with Dr Sallyanne Kuster as scheduled.    Medication Adjustments/Labs and Tests Ordered: Current medicines are reviewed at length with the patient today.  Concerns regarding medicines are outlined above.  Orders  Placed This Encounter  Procedures  . Basic Metabolic Panel (BMET)   No orders of the defined types were placed in this encounter.   Patient Instructions  Medication Instructions:  Your physician recommends that you continue on your current medications as directed. Please refer to the Current Medication list given to you today.   Labwork: Your physician recommends that you have lab work today: BMET   Testing/Procedures: -None  Follow-Up: Your physician recommends that you [place] scheduled  follow-up appointment With Dr.C on Wednesday, June 16 @ 1:20 pm.   Any Other Special Instructions Will Be Listed Below (If Applicable).     If you need a refill on your cardiac medications before your next appointment, please call your pharmacy.      Signed, Kerin Ransom, PA-C  11/08/2019 2:32 PM    [facility]

## 2019-11-08 NOTE — Assessment & Plan Note (Signed)
NSR on exam 

## 2019-11-08 NOTE — Assessment & Plan Note (Signed)
C-pap to start at home next week

## 2019-11-08 NOTE — Patient Instructions (Signed)
Medication Instructions:  Your physician recommends that you continue on your current medications as directed. Please refer to the Current Medication list given to you today.   Labwork: Your physician recommends that you have lab work today: BMET   Testing/Procedures: -None  Follow-Up: Your physician recommends that you [place] scheduled  follow-up appointment With Dr.C on Wednesday, June 16 @ 1:20 pm.   Any Other Special Instructions Will Be Listed Below (If Applicable).     If you need a refill on your cardiac medications before your next appointment, please call your pharmacy.

## 2019-11-08 NOTE — Assessment & Plan Note (Signed)
LAD PCI-'06-patent 2010 and July 2020

## 2019-11-08 NOTE — Assessment & Plan Note (Signed)
Currently compensated by exam and history- check BMP since diuretics adjusted in Feb

## 2019-11-09 LAB — BASIC METABOLIC PANEL
BUN/Creatinine Ratio: 9 — ABNORMAL LOW (ref 12–28)
BUN: 8 mg/dL (ref 8–27)
CO2: 26 mmol/L (ref 20–29)
Calcium: 9.9 mg/dL (ref 8.7–10.3)
Chloride: 101 mmol/L (ref 96–106)
Creatinine, Ser: 0.93 mg/dL (ref 0.57–1.00)
GFR calc Af Amer: 65 mL/min/{1.73_m2} (ref >59)
GFR calc non Af Amer: 57 mL/min/{1.73_m2} — ABNORMAL LOW (ref >59)
Glucose: 96 mg/dL (ref 65–99)
Potassium: 4.7 mmol/L (ref 3.5–5.2)
Sodium: 141 mmol/L (ref 134–144)

## 2019-11-14 ENCOUNTER — Ambulatory Visit: Payer: Medicare Other

## 2019-11-18 ENCOUNTER — Telehealth: Payer: Self-pay | Admitting: Cardiovascular Disease

## 2019-11-18 NOTE — Telephone Encounter (Signed)
Left message for [name] [name] to call and schedule Sleep Compliance appointment with Dr. Coralee Pesa be scheduled before 02/11/20

## 2019-11-27 ENCOUNTER — Encounter: Payer: Self-pay | Admitting: *Deleted

## 2019-11-29 ENCOUNTER — Telehealth: Payer: Self-pay | Admitting: Cardiovascular Disease

## 2019-11-29 NOTE — Telephone Encounter (Signed)
Received call in from schedulers, she states that she has been more SOB recently- she has slight chest tightness, but no pain, no swelling, no weight gain, she does mention a hot spell that she just had occur- and just wants to be checked out in office.  I offered a virtual visit, but patient would like to wait until the following week to be seen.  Scheduled with NP for SOB.  Patient verbalized understanding.   Advised to call 911 or go to ER if breathing becomes very labored, chest tightness turns to chest pains, arm pain, or neck pain.  Patient understood.

## 2019-11-29 NOTE — Telephone Encounter (Signed)
New Message  Pt c/o Shortness Of Breath: STAT if SOB developed within the last 24 hours or pt is noticeably SOB on the phone  1. Are you currently SOB (can you hear that pt is SOB on the phone)? Yes  2. How long have you been experiencing SOB? 2 weeks  3. Are you SOB when sitting or when up moving around? both  4. Are you currently experiencing any other symptoms? Have sweating spells

## 2019-12-07 NOTE — Progress Notes (Signed)
Cardiology Office Note   Date:  12/09/2019   ID:  BIBI SISE, DOB Feb 06, 1934, MRN [id number]  PCP:  [facility]  Cardiologist:  Dr. Sallyanne Kuster No chief complaint on file.    History of Present Illness: [name] [name] is a 86 y.o. female who presents we are following for ongoing assessment and management of chronic diastolic heart failure, CAD with history of PCI to the LAD in 2006, repeat cardiac catheterization July 2020 revealed patent stent, PAF diagnosed in March 2020 at [facility], she is on chronic anticoagulation CHADS VASC Score of 7, hypertension, with other history to include diet-controlled diabetes, OSA on CPAP (which she had not started by office visit on 11/08/2019).  Last seen by Kerin Ransom, PA, on 11/08/2019 after she had been seen by primary cardiologist Dr. Sallyanne Kuster, who had stopped her HCTZ and Lasix and Aldactone.  Recommended dry weight 180 pounds.  No medication changes were made, BMET was ordered to check kidney function with adjustment of medications prior to the visit.  She weighed 186 pounds at that time.  The BMET  on 11/08/2019 revealed a creatinine of 0.93, potassium 4.7, sodium 141.  EKG revealed normal sinus rhythm.  She called our office on 11/29/2019 with complaints of worsening shortness of breath and chest tightness but no pain.  She also stated that she had a "hot ".  She did not want to wait till appointment with cardiologist in June and requested a sooner appointment.  She has been noncompliant with CPAP.  She states that she feels as if it is going to smother her.  And therefore refuses to wear it.  She continues to have dyspnea on exertion, and sometimes feeling chest pressure while sitting on her couch watching television.  She states that she is not very active going to the mailbox to get mail is the most exertional thing that she does.  She mostly stays home in her house.  She states that she has to sleep on several pillows in order to be able to breathe well at  nighttime.  Her weight is up 6 pounds from "dry weight".     Past Medical History:  Diagnosis Date  . Anginal pain ([place])   . Anxiety   . Arthritis    "little in my thumbs"back & knees   . Breast cancer ([place])    R breast, treated with mastectomy/tomoxifen in early 2000s  . Bronchitis    hx of  . CAD (coronary artery disease)    The patient had a left heart catheterization in August 2006 showed an EF of 70%, a 70% mid LAD lesion and 80% distal LAD ledsion, as well as 50% ostial first diagonal lesion.  The patient did have a drug-eluting stent placed in her mid LAD at that time.    . Chest pressure 03/13/2019  . Depression   . Diastolic heart failure    Echo 11/10 with EF 55-60%, moderate diastolic dysfunction, no regional WMAs, mild to moderate TR, moderate LAE, mild AI  . Frequent urination at night   . GERD (gastroesophageal reflux disease)    "gone since gallbladder took out"  . Heart murmur    "small"  . Hypercholesterolemia   . Hypertension   . Hypothyroidism   . Mental disorder   . Sleep disturbance    sleep study done- told that there wasn't any apnea concerns, states she is up & down for use of bathroom several times per night   . Type II  diabetes mellitus ([place])    "controlled w/diet and exercise"  . Urinary incontinence     Past Surgical History:  Procedure Laterality Date  . [place] VITRECTOMY WITH 20 GAUGE MVR PORT Right 06/04/2013   Procedure: 25 GAUGE PARS PLANA VITRECTOMY WITH 20 GAUGE MVR PORT / REMOVAL SILICONE OIL RIGHT EYE. With Endolaser;  Surgeon: Hayden Pedro, MD;  Location: [place];  Service: Ophthalmology;  Laterality: Right;  . ABDOMINAL HYSTERECTOMY  1964  . Adenosine Myoview     9/09: EF 69% with normal perfusion images suggesting no ischemia or infarction.    . APPENDECTOMY  1954  . BACK SURGERY  06/2015   fusion-lumbar  . BREAST BIOPSY  2001   right  . CARDIAC CATHETERIZATION    . CARDIOVERSION N/A 03/15/2019   Procedure:  CARDIOVERSION;  Surgeon: Fay Records, MD;  Location: [place];  Service: Cardiovascular;  Laterality: N/A;  . CATARACT EXTRACTION W/ INTRAOCULAR LENS  IMPLANT, BILATERAL  ~ 2000  . CHOLECYSTECTOMY  ~ 2004  . COLONOSCOPY    . CORONARY ANGIOPLASTY    . CORONARY ANGIOPLASTY WITH STENT PLACEMENT  2000's   "1"  . DILATION AND CURETTAGE OF UTERUS  1960's   "I had 3"  . EYE SURGERY     /iol    following cataract removal  . GAS/FLUID EXCHANGE Right 06/04/2013   Procedure: GAS/FLUID EXCHANGE;  Surgeon: Hayden Pedro, MD;  Location: [place];  Service: Ophthalmology;  Laterality: Right;  . LEFT HEART CATH AND CORONARY ANGIOGRAPHY N/A 03/14/2019   Procedure: LEFT HEART CATH AND CORONARY ANGIOGRAPHY;  Surgeon: Belva Crome, MD;  Location: [place] [facility];  Service: Cardiovascular;  Laterality: N/A;  . MASTECTOMY  2001   right breast  . MEMBRANE PEEL Right 06/04/2013   Procedure: MEMBRANE PEEL;  Surgeon: Hayden Pedro, MD;  Location: [place];  Service: Ophthalmology;  Laterality: Right;  . NEUROPLASTY / TRANSPOSITION MEDIAN NERVE AT CARPAL TUNNEL BILATERAL  1990's  . PARS PLANA VITRECTOMY  04/03/2012   right  . PARS PLANA VITRECTOMY  04/03/2012   Procedure: PARS PLANA VITRECTOMY WITH 25 GAUGE;  Surgeon: Hayden Pedro, MD;  Location: [place];  Service: Ophthalmology;  Laterality: Right;  Silicone Oil, Perfluron, Repair Complex Traction Retinal Detachment  . PLANTAR FASCIA RELEASE  1990's   left  . TONSILLECTOMY AND ADENOIDECTOMY  1972  . TOTAL KNEE ARTHROPLASTY Left 10/13/2015   Procedure: TOTAL KNEE ARTHROPLASTY;  Surgeon: Melrose Nakayama, MD;  Location: [place];  Service: Orthopedics;  Laterality: Left;  . TOTAL KNEE ARTHROPLASTY Right 09/20/2016   Procedure: TOTAL KNEE ARTHROPLASTY;  Surgeon: Melrose Nakayama, MD;  Location: [place];  Service: Orthopedics;  Laterality: Right;  . ULNAR NERVE REPAIR     left arm     Current Outpatient Medications  Medication Sig Dispense Refill  . apixaban  (ELIQUIS) 5 MG TABS tablet Take 1 tablet (5 mg total) by mouth 2 (two) times daily. 180 tablet 3  . diltiazem (CARDIZEM CD) 180 MG 24 hr capsule Take 1 capsule (180 mg total) by mouth daily. 30 capsule 0  . isosorbide mononitrate (IMDUR) 120 MG 24 hr tablet Take 120 mg by mouth Daily.     Marland Kitchen losartan (COZAAR) 100 MG tablet Take 100 mg by mouth daily.    . metoprolol succinate (TOPROL-XL) 50 MG 24 hr tablet Take 1 tablet (50 mg total) by mouth daily. Take with or immediately following a meal. 30 tablet 0  .  traZODone (DESYREL) 150 MG tablet Take 225 mg by mouth at bedtime.     Marland Kitchen venlafaxine XR (EFFEXOR-XR) 150 MG 24 hr capsule Take 150 mg by mouth 2 (two) times a day.     . levothyroxine (SYNTHROID) 75 MCG tablet Take 75 mcg by mouth daily.    Marland Kitchen spironolactone (ALDACTONE) 25 MG tablet Take 1 tablet (25 mg total) by mouth daily. 30 tablet 3   No current facility-administered medications for this visit.    Allergies:   No known allergies    Social History:  The patient  reports that she has never smoked. She has never used smokeless tobacco. She reports that she does not drink alcohol or use drugs.   Family History:  The patient's family history includes Alcoholism in her father; Cancer in her brother and mother.    ROS: All other systems are reviewed and negative. Unless otherwise mentioned in H&P    PHYSICAL EXAM: VS:  BP (!) 142/76   Pulse 79   Ht 5\' 3"  (1.6 m)   Wt 186 lb (84.4 kg)   SpO2 97%   BMI 32.95 kg/m  , BMI Body mass index is 32.95 kg/m. GEN: Well nourished, well developed, in no acute distress, obese HEENT: normal Neck: no JVD, carotid bruits, or masses Cardiac: RRR; no murmurs, rubs, or gallops,no edema  Respiratory:  Clear to auscultation bilaterally, normal work of breathing GI: soft, nontender, nondistended, + BS MS: no deformity or atrophy Skin: warm and dry, no rash Neuro:  Strength and sensation are intact Psych: euthymic mood, full affect   EKG: Sinus  rhythm with PACs.  Heart rate of 75 bpm.  Recent Labs: 03/12/2019: Magnesium 2.2; TSH 7.938 03/13/2019: ALT 21; B Natriuretic Peptide 635.2 03/16/2019: Hemoglobin 13.5; Platelets 239 11/08/2019: BUN 8; Creatinine, Ser 0.93; Potassium 4.7; Sodium 141    Lipid Panel    Component Value Date/Time   CHOL 204 (H) 03/15/2019 0545   TRIG 128 03/15/2019 0545   HDL 51 03/15/2019 0545   CHOLHDL 4.0 03/15/2019 0545   VLDL 26 03/15/2019 0545   LDLCALC 127 (H) 03/15/2019 0545      Wt Readings from Last 3 Encounters:  12/09/19 186 lb (84.4 kg)  11/08/19 186 lb 12.8 oz (84.7 kg)  10/11/19 189 lb (85.7 kg)      Other studies Reviewed:  LHC 03/13/2020  Normal left main  Proximal eccentric 30 to 50% LAD stenoses.  Diffuse 20 to 30% in-stent restenosis in mid LAD, and eccentric 40 to 60% [place] 011 bifurcation stenosis with a distal second diagonal.  The apical LAD contains mild to moderate diffuse disease.  Circumflex gives a large branching first obtuse marginal, and prior to a significant angulation in the mid marginal there is 30% narrowing.  Right coronary is dominant and contains eccentric 30% ostial PDA.  Low normal LV systolic function with EF 45 to 50%.  Normal LVEDP.   ASSESSMENT AND PLAN:  1.  Chronic dyspnea: Multifactorial in the setting of OSA, sedentary lifestyle, obesity, and noncompliance with CPAP.  Her weight is up 6 pounds from prior office visit with a desired dry weight of 180 pounds.  Blood pressure is slightly elevated today.  She continues on spironolactone 25 mg daily along with losartan.  We will not restart HCTZ at this time as she does not have evidence of fluid overload.  She does state that she has to sit up on several pillows in order to be able to sleep.  But  this is nothing new for her.  After cardiac catheterization documented above it was recommended that she wear a cardiac monitor evaluating for PAF causing some of her symptoms.  I have discussed this with  her and she is willing to wear a 2-week Zio AT.    2.  CAD: Denies chest pain but does have dyspnea on exertion as described above.  I will order a Lexiscan Myoview to evaluate for areas of ischemia which would warrant repeat cardiac catheterization.  She is apprehensive about this as she has to lay down flat for a few minutes to have the nuclear medicine scan.  I have given her reassurance.  3.  Chronic diastolic heart failure: No evidence of fluid overload at this time.  She is not on any diuretics with exception of spironolactone used for hypertension.  Continue current management at this time.  Her weight is up 6 pounds may need to consider as needed Lasix.  4. OSA: Refuses to wear CPAP as she states that she is afraid she will smother.  She does have a follow-up with Dr. Claiborne Billings on January 29, 2020.  Discussion concerning options will be considered at that visit.  Current medicines are reviewed at length with the patient today.  I have spent 25 minutes dedicated to the care of this patient on the date of this encounter to include pre-visit review of records, assessment, management and diagnostic testing,with shared decision making.  Labs/ tests ordered today include: Lexiscan Myoview, 2-week Zio AT cardiac monitor.  Phill Myron. West Pugh, ANP, AACC   12/09/2019 1:23 PM    [address] Office [phone] Fax [phone]  Notice: This dictation was prepared with Dragon dictation along with smaller phrase technology. Any transcriptional errors that result from this process are unintentional and may not be corrected upon review.

## 2019-12-09 ENCOUNTER — Ambulatory Visit (INDEPENDENT_AMBULATORY_CARE_PROVIDER_SITE_OTHER): Payer: Medicare Other | Admitting: Adult Health

## 2019-12-09 ENCOUNTER — Encounter: Payer: Self-pay | Admitting: *Deleted

## 2019-12-09 ENCOUNTER — Other Ambulatory Visit: Payer: Self-pay

## 2019-12-09 ENCOUNTER — Encounter: Payer: Self-pay | Admitting: Adult Health

## 2019-12-09 ENCOUNTER — Encounter: Payer: Self-pay | Admitting: Cardiovascular Disease

## 2019-12-09 VITALS — BP 142/76 | HR 79 | Ht 63.0 in | Wt 186.0 lb

## 2019-12-09 DIAGNOSIS — R072 Precordial pain: Secondary | ICD-10-CM

## 2019-12-09 DIAGNOSIS — R0609 Other forms of dyspnea: Secondary | ICD-10-CM

## 2019-12-09 DIAGNOSIS — I10 Essential (primary) hypertension: Secondary | ICD-10-CM

## 2019-12-09 DIAGNOSIS — R0600 Dyspnea, unspecified: Secondary | ICD-10-CM

## 2019-12-09 DIAGNOSIS — R0602 Shortness of breath: Secondary | ICD-10-CM | POA: Diagnosis not present

## 2019-12-09 DIAGNOSIS — I5032 Chronic diastolic (congestive) heart failure: Secondary | ICD-10-CM

## 2019-12-09 DIAGNOSIS — I2510 Atherosclerotic heart disease of native coronary artery without angina pectoris: Secondary | ICD-10-CM

## 2019-12-09 DIAGNOSIS — G4733 Obstructive sleep apnea (adult) (pediatric): Secondary | ICD-10-CM

## 2019-12-09 NOTE — Patient Instructions (Signed)
Medication Instructions:  Continue current medications  *If you need a refill on your cardiac medications before your next appointment, please call your pharmacy*   Lab Work: None Ordered  Testing/Procedures: Your physician has requested that you have a lexiscan myoview. For further information please visit [email or web address]. Please follow instruction sheet, as given.  Your physician has recommended that you wear a 2 weeks Zio monitor. Holter monitors are medical devices that record the heart's electrical activity. Doctors most often use these monitors to diagnose arrhythmias. Arrhythmias are problems with the speed or rhythm of the heartbeat. The monitor is a small, portable device. You can wear one while you do your normal daily activities. This is usually used to diagnose what is causing palpitations/syncope (passing out).   Follow-Up: At [facility], you and your health needs are our priority.  As part of our continuing mission to provide you with exceptional heart care, we have created designated Provider Care Teams.  These Care Teams include your primary Cardiologist (physician) and Advanced Practice Providers (APPs -  Physician Assistants and Nurse Practitioners) who all work together to provide you with the care you need, when you need it.  We recommend signing up for the patient portal called "MyChart".  Sign up information is provided on this After Visit Summary.  MyChart is used to connect with patients for Virtual Visits (Telemedicine).  Patients are able to view lab/test results, encounter notes, upcoming appointments, etc.  Non-urgent messages can be sent to your provider as well.   To learn more about what you can do with MyChart, go to [email or web address].    Your next appointment:   Keep appointment on June 6th @ 1:20 pm  The format for your next appointment:   In Person  Provider:   [name], MD   Other:  Bryn Gulling- Long Term Monitor Instructions    Your physician has requested you wear your ZIO patch monitor 14 days.   This is a single patch monitor.  [employer] supplies one patch monitor per enrollment.  Additional stickers are not available.   Please do not apply patch if you will be having a Nuclear Stress Test, Echocardiogram, Cardiac CT, MRI, or Chest Xray during the time frame you would be wearing the monitor. The patch cannot be worn during these tests.  You cannot remove and re-apply the ZIO XT patch monitor.   Your ZIO patch monitor will be sent USPS Priority mail from [facility] directly to your home address. The monitor may also be mailed to a PO BOX if home delivery is not available.   It may take 3-5 days to receive your monitor after you have been enrolled.   Once you have received you monitor, please review enclosed instructions.  Your monitor has already been registered assigning a specific monitor serial # to you.   Applying the monitor   Shave hair from upper left chest.   Hold abrader disc by orange tab.  Rub abrader in 40 strokes over left upper chest as indicated in your monitor instructions.   Clean area with 4 enclosed alcohol pads .  Use all pads to assure are is cleaned thoroughly.  Let dry.   Apply patch as indicated in monitor instructions.  Patch will be place under collarbone on left side of chest with arrow pointing upward.   Rub patch adhesive wings for 2 minutes.Remove white label marked "1".  Remove white label marked "2".  Rub patch adhesive wings for 2 additional minutes.  While looking in a mirror, press and release button in center of patch.  A small green light will flash 3-4 times .  This will be your only indicator the monitor has been turned on.     Do not shower for the first 24 hours.  You may shower after the first 24 hours.   Press button if you feel a symptom. You will hear a small click.  Record Date, Time and Symptom in the Patient Log Book.   When you are ready to remove  patch, follow instructions on last 2 pages of Patient Log Book.  Stick patch monitor onto last page of Patient Log Book.   Place Patient Log Book in [place] box.  Use locking tab on box and tape box closed securely.  The [employer] has [employer] on it.  Please place in mailbox as soon as possible.  Your physician should have your test results approximately 7 days after the monitor has been mailed back to [facility].   Call [place] at [phone] if you have questions regarding your ZIO XT patch monitor.  Call them immediately if you see an orange light blinking on your monitor.   If your monitor falls off in less than 4 days contact our Monitor department at [phone].  If your monitor becomes loose or falls off after 4 days call [employer] at [phone] for suggestions on securing your monitor.

## 2019-12-09 NOTE — Progress Notes (Signed)
Patient ID: [name] [name], female   DOB: 01-28-1934, 86 y.o.   MRN: [id number] Patient enrolled for [employer] to ship a 14 day ZIO AT long term monitor-Live Telemetry to her home.  Instructions mailed to patient.

## 2019-12-19 ENCOUNTER — Telehealth: Payer: Self-pay | Admitting: Cardiovascular Disease

## 2019-12-19 NOTE — Telephone Encounter (Signed)
Spoke daughter. She states patient  wore monitor for one day and the next day the adhesive came off. patient did not call company . And has not placed it back on.   daughter is aware once she get home with patient is to call zio monitoring service.

## 2019-12-19 NOTE — Telephone Encounter (Signed)
Spoke to rep. Janett Billow , informed patient has not been using monitor . It came off.  Instruction where given to daughter [name] to call  Company an trouble shoot the problems Phone given.

## 2019-12-19 NOTE — Telephone Encounter (Signed)
Called patient home number - mail box is full.   Called mobile # which is to her daughter [name].  Need to leave message   ask to call back ask for Rn or Triage   wanting to know if patient is wearing Zio monitor  , if so will need to have notify [employer] and get a number that they can contact patient.

## 2019-12-19 NOTE — Telephone Encounter (Signed)
Katharine Look from [employer] called and states that they can not get in touch with the patient to confirm if they are still wearing their zio patch. She states that they have 1 day of data. They have left several messages with call back numbers but she has not returned any calls.

## 2019-12-20 ENCOUNTER — Telehealth: Payer: Self-pay | Admitting: Cardiovascular Disease

## 2019-12-20 ENCOUNTER — Inpatient Hospital Stay (HOSPITAL_COMMUNITY): Admission: RE | Admit: 2019-12-20 | Payer: Medicare Other | Source: Ambulatory Visit

## 2019-12-20 NOTE — Telephone Encounter (Signed)
[name] [name] from [employer] states that the patient wore her zio heart monitor for 4 days. She says it fell off and could not get it back on. [name] [name] wants to know if Dr. Sallyanne Kuster would like for them to mail a new monitor out. Please advise.

## 2019-12-20 NOTE — Telephone Encounter (Signed)
Checked the ZIO website for the readings. There is only one day of readings. The rest were artifact.  Day 1: Sinus was present with a min HR of 56 bpm, max HR of 67 bpm, and avg HR of 64 bpm. Isolated SVE(s) were present. Isolated VE(s) were present. Ventricular Trigeminy was present.   This has been printed and left for the provider.

## 2019-12-22 NOTE — Telephone Encounter (Signed)
Yes please send a new monitor for one week.   Thank you. KL

## 2019-12-23 NOTE — Telephone Encounter (Signed)
Called returned to [facility]. They will mail out a replacement monitor. The patient has been made aware and is in agreement. She will need to call [employer] once she receives the new monitor.

## 2019-12-25 ENCOUNTER — Ambulatory Visit (INDEPENDENT_AMBULATORY_CARE_PROVIDER_SITE_OTHER): Payer: Medicare Other

## 2019-12-25 DIAGNOSIS — R072 Precordial pain: Secondary | ICD-10-CM

## 2019-12-25 DIAGNOSIS — I480 Paroxysmal atrial fibrillation: Secondary | ICD-10-CM | POA: Diagnosis not present

## 2019-12-25 DIAGNOSIS — R0602 Shortness of breath: Secondary | ICD-10-CM

## 2019-12-31 ENCOUNTER — Telehealth (HOSPITAL_COMMUNITY): Payer: Self-pay

## 2019-12-31 NOTE — Telephone Encounter (Signed)
Encounter complete. 

## 2020-01-02 ENCOUNTER — Ambulatory Visit (HOSPITAL_COMMUNITY)
Admission: RE | Admit: 2020-01-02 | Discharge: 2020-01-02 | Disposition: A | Discharge status: Home / Self Care | Payer: Medicare Other | Source: Ambulatory Visit | Attending: Cardiology | Admitting: Cardiology

## 2020-01-02 ENCOUNTER — Other Ambulatory Visit: Payer: Self-pay

## 2020-01-02 DIAGNOSIS — R072 Precordial pain: Secondary | ICD-10-CM | POA: Insufficient documentation

## 2020-01-02 DIAGNOSIS — R0602 Shortness of breath: Secondary | ICD-10-CM | POA: Insufficient documentation

## 2020-01-02 LAB — MYOCARDIAL PERFUSION IMAGING
LV dias vol: 83 mL (ref 46–106)
LV sys vol: 28 mL
Peak HR: 90 {beats}/min
Rest HR: 66 {beats}/min
SDS: 0
SRS: 7
SSS: 7
TID: 1.06

## 2020-01-02 MED ORDER — TECHNETIUM TC 99M TETROFOSMIN IV KIT
9.8000 | PACK | Freq: Once | INTRAVENOUS | Status: AC | PRN
  Administered 2020-01-02: 9.8 via INTRAVENOUS
  Filled 2020-01-02: qty 10

## 2020-01-02 MED ORDER — REGADENOSON 0.4 MG/5ML IV SOLN
0.4000 mg | Freq: Once | INTRAVENOUS | Status: AC
  Administered 2020-01-02: 0.4 mg via INTRAVENOUS

## 2020-01-02 MED ORDER — TECHNETIUM TC 99M TETROFOSMIN IV KIT
32.0000 | PACK | Freq: Once | INTRAVENOUS | Status: AC | PRN
  Administered 2020-01-02: 32 via INTRAVENOUS
  Filled 2020-01-02: qty 32

## 2020-01-07 ENCOUNTER — Other Ambulatory Visit: Payer: Self-pay | Admitting: Adult Health

## 2020-01-07 ENCOUNTER — Other Ambulatory Visit: Payer: Self-pay | Admitting: *Deleted

## 2020-01-07 DIAGNOSIS — R072 Precordial pain: Secondary | ICD-10-CM

## 2020-01-07 DIAGNOSIS — R0602 Shortness of breath: Secondary | ICD-10-CM

## 2020-01-07 DIAGNOSIS — I480 Paroxysmal atrial fibrillation: Secondary | ICD-10-CM

## 2020-01-09 ENCOUNTER — Telehealth: Payer: Self-pay | Admitting: *Deleted

## 2020-01-09 MED ORDER — APIXABAN 5 MG PO TABS: 5.0000 mg | ORAL_TABLET | Freq: Two times a day (BID) | ORAL | 3 refills | Status: DC

## 2020-01-09 NOTE — Telephone Encounter (Signed)
-----   Message from [name], NP sent at 01/05/2020  7:59 AM EDT ----- Stress test shows area of scarring from previous heart attack. No  new areas of concern. Normal heart pumping function. Will need to continue her current treatment regimen.    KL

## 2020-01-09 NOTE — Telephone Encounter (Signed)
[name], pts daughter called regarding pt's stress test results. Advised her that RN would call back.

## 2020-01-09 NOTE — Telephone Encounter (Signed)
Patient called stating she cannot remember what was told to her about her stress test. She states she would like someone to call her daughter Nailyn [place] [phone], to give her the results.

## 2020-01-09 NOTE — Telephone Encounter (Signed)
Spoke with the patients daughter and informed her of the results.

## 2020-01-09 NOTE — Telephone Encounter (Signed)
Pt aware of her stress test, pt have appt with Dr Sallyanne Kuster on 06/16 advised pt to keep appointment

## 2020-01-09 NOTE — Telephone Encounter (Signed)
Left patient's daughter a message to call regarding stress test results. 5/27

## 2020-01-23 ENCOUNTER — Telehealth: Payer: Self-pay | Admitting: *Deleted

## 2020-01-23 MED ORDER — DILTIAZEM HCL ER COATED BEADS 180 MG PO CP24: 180.0000 mg | ORAL_CAPSULE | Freq: Every day | ORAL | 3 refills | Status: DC

## 2020-01-23 MED ORDER — APIXABAN 5 MG PO TABS: 5.0000 mg | ORAL_TABLET | Freq: Two times a day (BID) | ORAL | 3 refills | Status: DC

## 2020-01-23 NOTE — Telephone Encounter (Signed)
-----   Message from [name], MD sent at 01/23/2020  9:33 AM EDT ----- There was no AFib on the monitor and there were just brief episodes of atrial tachycardia, that did not correlate with the episodes of shortness of breath. Continue anticoagulation and diltiazem, but no need to change cardiac meds. Based on the multiple cardiac tests, suspect the shortness of breath is due to the lung disease

## 2020-01-23 NOTE — Telephone Encounter (Signed)
Pt aware of her monitor result

## 2020-01-29 ENCOUNTER — Ambulatory Visit: Payer: Medicare Other | Admitting: Cardiovascular Disease

## 2020-01-29 ENCOUNTER — Other Ambulatory Visit: Payer: Self-pay

## 2020-01-29 ENCOUNTER — Encounter: Payer: Self-pay | Admitting: Cardiovascular Disease

## 2020-01-29 VITALS — BP 140/82 | HR 71 | Ht 63.0 in | Wt 184.0 lb

## 2020-01-29 DIAGNOSIS — I10 Essential (primary) hypertension: Secondary | ICD-10-CM

## 2020-01-29 DIAGNOSIS — I480 Paroxysmal atrial fibrillation: Secondary | ICD-10-CM | POA: Diagnosis not present

## 2020-01-29 DIAGNOSIS — I2510 Atherosclerotic heart disease of native coronary artery without angina pectoris: Secondary | ICD-10-CM | POA: Diagnosis not present

## 2020-01-29 DIAGNOSIS — I5032 Chronic diastolic (congestive) heart failure: Secondary | ICD-10-CM

## 2020-01-29 DIAGNOSIS — E119 Type 2 diabetes mellitus without complications: Secondary | ICD-10-CM

## 2020-01-29 DIAGNOSIS — E7800 Pure hypercholesterolemia, unspecified: Secondary | ICD-10-CM

## 2020-01-29 DIAGNOSIS — R0602 Shortness of breath: Secondary | ICD-10-CM

## 2020-01-29 DIAGNOSIS — G4733 Obstructive sleep apnea (adult) (pediatric): Secondary | ICD-10-CM

## 2020-01-29 MED ORDER — FUROSEMIDE 40 MG PO TABS
40.0000 mg | ORAL_TABLET | Freq: Every day | ORAL | 3 refills | Status: DC
Start: 2020-01-29 — End: 2021-12-14

## 2020-01-29 NOTE — Progress Notes (Signed)
Cardiology Office Note:    Date:  01/31/2020   ID:  [name] [name], DOB October 28, 1933, MRN [id number]  PCP:  [facility], [place]  Cardiologist:  [name], MD  Electrophysiologist:  None   Referring MD: [facility], [facility] Complaint  Patient presents with  . Shortness of Breath    History of Present Illness:    [name] [name] is a 86 y.o. female with a hx of chronic diastolic HF, CAD (mid LAD stent 2006, no significant stenoses at cardiac catheterization 2010 and 2020), paroxysmal atrial fibrillation, HTN, OSA (mild, AHI 5.7/min), moderate obesity, diet controlled DM.  Cardiac catheterization 2020 did report mildly depressed LVEF 45-50%, but this was during atrial fibrillation.  More recently EF 66% by nuclear scintigraphy.  Continues to have NYHA functional class II, sometimes functional class III exertional dyspnea.  Usually activities of self-care do not make her short of breath, but making the bed or walking to the mailbox are often enough to make her dyspneic.  She is no longer taking loop diuretics although she is still on spironolactone.  I am not sure why the diuretics were stopped.  She has normal kidney function by very recent labs.  She has not had orthopnea or PND.  She denies angina pectoris at rest or with activity.  She has not had dizziness, palpitations or syncope.  She does not have intermittent claudication.  Compliance with CPAP appears to remain spotty.  She is above her target "dry weight" of 180 pounds.  She underwent a nuclear stress test on 01/02/2020 which did not show any reversible ischemia, although she did have an apical scar consistent with her previous LAD disease.  EF was normal at 66%.  She then had a 14-day event monitor for persistent complaints.  This did not show any evidence of atrial fibrillation.  Very brief episodes of atrial tachycardia were seen but were not symptomatic.  During patient triggered symptomatic recordings the rhythm was normal sinus  rhythm.  Past Medical History:  Diagnosis Date  . Anginal pain ([place])   . Anxiety   . Arthritis    "little in my thumbs"back & knees   . Breast cancer ([place])    R breast, treated with mastectomy/tomoxifen in early 2000s  . Bronchitis    hx of  . CAD (coronary artery disease)    The patient had a left heart catheterization in August 2006 showed an EF of 70%, a 70% mid LAD lesion and 80% distal LAD ledsion, as well as 50% ostial first diagonal lesion.  The patient did have a drug-eluting stent placed in her mid LAD at that time.    . Chest pressure 03/13/2019  . Depression   . Diastolic heart failure    Echo 11/10 with EF 55-60%, moderate diastolic dysfunction, no regional WMAs, mild to moderate TR, moderate LAE, mild AI  . Frequent urination at night   . GERD (gastroesophageal reflux disease)    "gone since gallbladder took out"  . Heart murmur    "small"  . Hypercholesterolemia   . Hypertension   . Hypothyroidism   . Mental disorder   . Sleep disturbance    sleep study done- told that there wasn't any apnea concerns, states she is up & down for use of bathroom several times per night   . Type II diabetes mellitus ([place])    "controlled w/diet and exercise"  . Urinary incontinence     Past Surgical History:  Procedure Laterality Date  .  [place] VITRECTOMY WITH 20 GAUGE MVR PORT Right 06/04/2013   Procedure: 25 GAUGE PARS PLANA VITRECTOMY WITH 20 GAUGE MVR PORT / REMOVAL SILICONE OIL RIGHT EYE. With Endolaser;  Surgeon: Hayden Pedro, MD;  Location: [place];  Service: Ophthalmology;  Laterality: Right;  . ABDOMINAL HYSTERECTOMY  1964  . Adenosine Myoview     9/09: EF 69% with normal perfusion images suggesting no ischemia or infarction.    . APPENDECTOMY  1954  . BACK SURGERY  06/2015   fusion-lumbar  . BREAST BIOPSY  2001   right  . CARDIAC CATHETERIZATION    . CARDIOVERSION N/A 03/15/2019   Procedure: CARDIOVERSION;  Surgeon: Fay Records, MD;  Location: [place];  Service: Cardiovascular;  Laterality: N/A;  . CATARACT EXTRACTION W/ INTRAOCULAR LENS  IMPLANT, BILATERAL  ~ 2000  . CHOLECYSTECTOMY  ~ 2004  . COLONOSCOPY    . CORONARY ANGIOPLASTY    . CORONARY ANGIOPLASTY WITH STENT PLACEMENT  2000's   "1"  . DILATION AND CURETTAGE OF UTERUS  1960's   "I had 3"  . EYE SURGERY     /iol    following cataract removal  . GAS/FLUID EXCHANGE Right 06/04/2013   Procedure: GAS/FLUID EXCHANGE;  Surgeon: Hayden Pedro, MD;  Location: [place];  Service: Ophthalmology;  Laterality: Right;  . LEFT HEART CATH AND CORONARY ANGIOGRAPHY N/A 03/14/2019   Procedure: LEFT HEART CATH AND CORONARY ANGIOGRAPHY;  Surgeon: Belva Crome, MD;  Location: [facility];  Service: Cardiovascular;  Laterality: N/A;  . MASTECTOMY  2001   right breast  . MEMBRANE PEEL Right 06/04/2013   Procedure: MEMBRANE PEEL;  Surgeon: Hayden Pedro, MD;  Location: [place];  Service: Ophthalmology;  Laterality: Right;  . NEUROPLASTY / TRANSPOSITION MEDIAN NERVE AT CARPAL TUNNEL BILATERAL  1990's  . PARS PLANA VITRECTOMY  04/03/2012   right  . PARS PLANA VITRECTOMY  04/03/2012   Procedure: PARS PLANA VITRECTOMY WITH 25 GAUGE;  Surgeon: Hayden Pedro, MD;  Location: [place];  Service: Ophthalmology;  Laterality: Right;  Silicone Oil, Perfluron, Repair Complex Traction Retinal Detachment  . PLANTAR FASCIA RELEASE  1990's   left  . TONSILLECTOMY AND ADENOIDECTOMY  1972  . TOTAL KNEE ARTHROPLASTY Left 10/13/2015   Procedure: TOTAL KNEE ARTHROPLASTY;  Surgeon: Melrose Nakayama, MD;  Location: [place];  Service: Orthopedics;  Laterality: Left;  . TOTAL KNEE ARTHROPLASTY Right 09/20/2016   Procedure: TOTAL KNEE ARTHROPLASTY;  Surgeon: Melrose Nakayama, MD;  Location: [place];  Service: Orthopedics;  Laterality: Right;  . ULNAR NERVE REPAIR     left arm    Current Medications: Current Meds  Medication Sig  . apixaban (ELIQUIS) 5 MG TABS tablet Take 1 tablet (5 mg total) by mouth 2 (two) times  daily.  Marland Kitchen diltiazem (CARDIZEM CD) 180 MG 24 hr capsule Take 1 capsule (180 mg total) by mouth daily.  . isosorbide mononitrate (IMDUR) 120 MG 24 hr tablet Take 120 mg by mouth Daily.   Marland Kitchen levothyroxine (SYNTHROID) 75 MCG tablet Take 75 mcg by mouth daily.  Marland Kitchen losartan (COZAAR) 100 MG tablet Take 100 mg by mouth daily.  . metoprolol succinate (TOPROL-XL) 50 MG 24 hr tablet Take 1 tablet (50 mg total) by mouth daily. Take with or immediately following a meal.  . traZODone (DESYREL) 150 MG tablet Take 225 mg by mouth at bedtime.   Marland Kitchen venlafaxine XR (EFFEXOR-XR) 150 MG 24 hr capsule Take 150 mg by mouth  2 (two) times a day.      Allergies:   No known allergies   Social History   Socioeconomic History  . Marital status: Married    Spouse name: Not on file  . Number of children: Not on file  . Years of education: Not on file  . Highest education level: Not on file  Occupational History  . Occupation: Hotel manager: RETIRED    Comment: part-time  Tobacco Use  . Smoking status: Never Smoker  . Smokeless tobacco: Never Used  Vaping Use  . Vaping Use: Never used  Substance and Sexual Activity  . Alcohol use: No  . Drug use: No  . Sexual activity: Not Currently  Other Topics Concern  . Not on file  Social History Narrative  . Not on file   Social Determinants of Health   Financial Resource Strain:   . Difficulty of Paying Living Expenses:   Food Insecurity:   . Worried About Charity fundraiser in the Last Year:   . Arboriculturist in the Last Year:   Transportation Needs:   . Film/video editor (Medical):   Marland Kitchen Lack of Transportation (Non-Medical):   Physical Activity:   . Days of Exercise per Week:   . Minutes of Exercise per Session:   Stress:   . Feeling of Stress :   Social Connections:   . Frequency of Communication with Friends and Family:   . Frequency of Social Gatherings with Friends and Family:   . Attends Religious Services:   . Active Member of  Clubs or Organizations:   . Attends Archivist Meetings:   Marland Kitchen Marital Status:      Family History: The patient's family history includes Alcoholism in her father; Cancer in her brother and mother.  ROS:   Please see the history of present illness.    All other systems are reviewed and are negative  EKGs/Labs/Other Studies Reviewed:    The following studies were reviewed today: Nuclear stress test 01/02/2020 14-day event monitor 01/23/2020  EKG:  EKG is not ordered today.  ECG from 12/09/2019 shows sinus rhythm with incomplete right bundle branch block and borderline left axis deviation.  Older tracings have shown complete RBBB plus LAFB no ischemic changes are seen on the current tracing.  Recent Labs: 03/12/2019: Magnesium 2.2; TSH 7.938 03/13/2019: ALT 21; B Natriuretic Peptide 635.2 03/16/2019: Hemoglobin 13.5; Platelets 239 11/08/2019: BUN 8; Creatinine, Ser 0.93; Potassium 4.7; Sodium 141  Recent Lipid Panel    Component Value Date/Time   CHOL 204 (H) 03/15/2019 0545   TRIG 128 03/15/2019 0545   HDL 51 03/15/2019 0545   CHOLHDL 4.0 03/15/2019 0545   VLDL 26 03/15/2019 0545   LDLCALC 127 (H) 03/15/2019 0545    Physical Exam:    VS:  BP 140/82 (BP Location: Left Arm, Patient Position: Sitting, Cuff Size: Normal)   Pulse 71   Ht 5\' 3"  (1.6 m)   Wt 184 lb (83.5 kg)   SpO2 96%   BMI 32.59 kg/m     Wt Readings from Last 3 Encounters:  01/29/20 184 lb (83.5 kg)  01/02/20 186 lb (84.4 kg)  12/09/19 186 lb (84.4 kg)     General: Alert, oriented x3, no distress, mildly obese Head: no evidence of trauma, PERRL, EOMI, no exophtalmos or lid lag, no myxedema, no xanthelasma; normal ears, nose and oropharynx Neck: Hard to be sure, but there seems to be a 6-7  cm elevation in jugular venous pulsations and hepatojugular reflux; brisk carotid pulses without delay and no carotid bruits Chest: clear to auscultation, no signs of consolidation by percussion or palpation,  normal fremitus, symmetrical and full respiratory excursions Cardiovascular: normal position and quality of the apical impulse, regular rhythm, normal first and second heart sounds, no murmurs, rubs or gallops Abdomen: no tenderness or distention, no masses by palpation, no abnormal pulsatility or arterial bruits, normal bowel sounds, no hepatosplenomegaly Extremities: no clubbing, cyanosis or edema; 2+ radial, ulnar and brachial pulses bilaterally; 2+ right femoral, posterior tibial and dorsalis pedis pulses; 2+ left femoral, posterior tibial and dorsalis pedis pulses; no subclavian or femoral bruits Neurological: grossly nonfocal Psych: Normal mood and affect   ASSESSMENT:    1. Chronic diastolic HF (heart failure) ([place])   2. PAF (paroxysmal atrial fibrillation) ([place])   3. Coronary artery disease involving native coronary artery of native heart without angina pectoris   4. Type 2 diabetes, diet controlled ([place])   5. Pure hypercholesterolemia   6. Essential hypertension   7. OSA (obstructive sleep apnea)   8. Shortness of breath    PLAN:    In order of problems listed above:  1. CHF: Has exertional dyspnea and seems to have some subtle physical findings of hypervolemia.  Reviewed sodium restriction and daily weights.  Restart the furosemide, I am not sure when this was stopped and why.  Bring her back for a basic metabolic panel, BNP and lipids.  Bring weight log to follow-up visit.  Again recommend bringing her down to a "dry weight" of less than 180 pounds. 2. AFib: Maintaining sinus rhythm.  No episodes of significant arrhythmia on event monitor even when she was symptomatic.  CHADSVasc 7 (age 10, gender, HF, CAD, DM, HTN). On both diltiazem and metoprolol for rate control of breakthrough events. 3. CAD: Asymptomatic currently.  No significant stenoses at coronary geography in 2020 and very recent nuclear stress test in May 2021.  She has really not had any disease progression since 2006  when she received a stent to the LAD.  Not on aspirin due to full anticoagulation.  Need to figure out why she is not on a statin.  Repeat lipid profile ordered.  Target LDL cholesterol less than 70. 4. DM: Excellent control with diet only 5. HLP: Most recent LDL cholesterol was 128 in July 2020.  Target LDL less than 70. 6. HTN: Borderline elevated, but suspect will decrease a bit with diuresis. 7. OSA: Encouraged her to use CPAP.   Medication Adjustments/Labs and Tests Ordered: Current medicines are reviewed at length with the patient today.  Concerns regarding medicines are outlined above.  Orders Placed This Encounter  Procedures  . Basic metabolic panel  . Brain natriuretic peptide  . Lipid panel   Meds ordered this encounter  Medications  . furosemide (LASIX) 40 MG tablet    Sig: Take 1 tablet (40 mg total) by mouth daily.    Dispense:  30 tablet    Refill:  3    Patient Instructions  Medication Instructions:  START Furosemide 40 mg once daily  *If you need a refill on your cardiac medications before your next appointment, please call your pharmacy*   Lab Work: Your provider would like for you to return in 2 weeks to have the following labs drawn: BNP. BMET and Lipid. You do not need an appointment for the lab. Once in our office lobby there is a podium where you can sign  in and ring the doorbell to alert [place] that you are here. The lab is open from 8:00 am to 4:30 pm; closed for lunch from 12:45pm-1:45pm.  If you have labs (blood work) drawn today and your tests are completely normal, you will receive your results only by: Marland Kitchen MyChart Message (if you have MyChart) OR . A paper copy in the mail If you have any lab test that is abnormal or we need to change your treatment, we will call you to review the results.   Testing/Procedures: None ordered   Follow-Up: At [facility], you and your health needs are our priority.  As part of our continuing mission to provide you  with exceptional heart care, we have created designated Provider Care Teams.  These Care Teams include your primary Cardiologist (physician) and Advanced Practice Providers (APPs -  Physician Assistants and Nurse Practitioners) who all work together to provide you with the care you need, when you need it.  We recommend signing up for the patient portal called "MyChart".  Sign up information is provided on this After Visit Summary.  MyChart is used to connect with patients for Virtual Visits (Telemedicine).  Patients are able to view lab/test results, encounter notes, upcoming appointments, etc.  Non-urgent messages can be sent to your provider as well.   To learn more about what you can do with MyChart, go to [email or web address].    Your next appointment:   4 week(s)  The format for your next appointment:   In Person  Provider:   Follow up with an APP      Signed, [name], MD  01/31/2020 7:36 PM    [place]

## 2020-01-29 NOTE — Patient Instructions (Signed)
Medication Instructions:  START Furosemide 40 mg once daily  *If you need a refill on your cardiac medications before your next appointment, please call your pharmacy*   Lab Work: Your provider would like for you to return in 2 weeks to have the following labs drawn: BNP. BMET and Lipid. You do not need an appointment for the lab. Once in our office lobby there is a podium where you can sign in and ring the doorbell to alert [place] that you are here. The lab is open from 8:00 am to 4:30 pm; closed for lunch from 12:45pm-1:45pm.  If you have labs (blood work) drawn today and your tests are completely normal, you will receive your results only by: Marland Kitchen MyChart Message (if you have MyChart) OR . A paper copy in the mail If you have any lab test that is abnormal or we need to change your treatment, we will call you to review the results.   Testing/Procedures: None ordered   Follow-Up: At [facility], you and your health needs are our priority.  As part of our continuing mission to provide you with exceptional heart care, we have created designated Provider Care Teams.  These Care Teams include your primary Cardiologist (physician) and Advanced Practice Providers (APPs -  Physician Assistants and Nurse Practitioners) who all work together to provide you with the care you need, when you need it.  We recommend signing up for the patient portal called "MyChart".  Sign up information is provided on this After Visit Summary.  MyChart is used to connect with patients for Virtual Visits (Telemedicine).  Patients are able to view lab/test results, encounter notes, upcoming appointments, etc.  Non-urgent messages can be sent to your provider as well.   To learn more about what you can do with MyChart, go to [email or web address].    Your next appointment:   4 week(s)  The format for your next appointment:   In Person  Provider:   Follow up with an APP

## 2020-02-04 ENCOUNTER — Telehealth: Payer: Self-pay | Admitting: Cardiovascular Disease

## 2020-02-04 NOTE — Telephone Encounter (Signed)
Patient's daughter called in. She states that her daughter will be bringing her mother to her appt on 02/06/20 with Dr. Claiborne Billings. She states that the patient does not remember things very well.

## 2020-02-04 NOTE — Telephone Encounter (Signed)
Ok to come to appt.

## 2020-02-06 ENCOUNTER — Encounter: Payer: Self-pay | Admitting: Cardiovascular Disease

## 2020-02-06 ENCOUNTER — Ambulatory Visit: Payer: Medicare Other | Admitting: Cardiovascular Disease

## 2020-02-06 ENCOUNTER — Other Ambulatory Visit: Payer: Self-pay

## 2020-02-06 VITALS — BP 130/70 | HR 74 | Temp 98.2°F | Ht 63.0 in | Wt 184.0 lb

## 2020-02-06 DIAGNOSIS — I480 Paroxysmal atrial fibrillation: Secondary | ICD-10-CM | POA: Diagnosis not present

## 2020-02-06 DIAGNOSIS — G4733 Obstructive sleep apnea (adult) (pediatric): Secondary | ICD-10-CM | POA: Diagnosis not present

## 2020-02-06 DIAGNOSIS — E669 Obesity, unspecified: Secondary | ICD-10-CM | POA: Diagnosis not present

## 2020-02-06 DIAGNOSIS — I10 Essential (primary) hypertension: Secondary | ICD-10-CM | POA: Diagnosis not present

## 2020-02-06 NOTE — Patient Instructions (Signed)
Medication Instructions:  CONTINUE WITH CURRENT MEDICATIONS. NO CHANGES.  *If you need a refill on your cardiac medications before your next appointment, please call your pharmacy*   Follow-Up: At [facility], you and your health needs are our priority.  As part of our continuing mission to provide you with exceptional heart care, we have created designated Provider Care Teams.  These Care Teams include your primary Cardiologist (physician) and Advanced Practice Providers (APPs -  Physician Assistants and Nurse Practitioners) who all work together to provide you with the care you need, when you need it.  We recommend signing up for the patient portal called "MyChart".  Sign up information is provided on this After Visit Summary.  MyChart is used to connect with patients for Virtual Visits (Telemedicine).  Patients are able to view lab/test results, encounter notes, upcoming appointments, etc.  Non-urgent messages can be sent to your provider as well.   To learn more about what you can do with MyChart, go to [email or web address].    Your next appointment:   FOLLOW UP AS NEEDED WITH DR.KELLY

## 2020-02-08 ENCOUNTER — Encounter: Payer: Self-pay | Admitting: Cardiovascular Disease

## 2020-02-08 NOTE — Progress Notes (Addendum)
Cardiology Office Note    Date:  02/08/2020   ID:  DESANI SPRUNG, DOB 25-Apr-1934, MRN [id number]  PCP:  [facility]  Cardiologist:  Shelva Majestic, MD   Sleep evaluation  History of Present Illness:  NICOLE DEFINO is a 86 y.o. female who is followed by Dr. Sallyanne Kuster and had established care with him in May 2020 in a telemedicine evaluation.  Remotely the patient had been cared for by Dr. Loralie Champagne.  She has a history of CAD with DES stenting to her LAD in 2006.  Catheterization in 2010 showed a patent proximal LAD stent with 50% mid LAD stenosis.  An echo Doppler study in 2014 showed an EF of 60 to 65% with grade 2 diastolic dysfunction, mild AR, mildly dilated left atrium.  She has a history of prior breast cancer and status post surgery in 2000 followed by treatment with tamoxifen as well as a history of lumbar spine surgery and rotator cuff surgery.  There was also concern for possible sleep apnea.  She developed palpitations in March 2020 and was evaluated by Dr. Dwyane Dee at [facility] and was in atrial fibrillation.  Eliquis was initiated and she was started on aspirin and metoprolol.  She saw Dr. Sallyanne Kuster in a telemedicine evaluation in May 2000.  She presented to the hospital in July 2020 with increasing shortness of breath and chest tightness like a weight sitting on her chest.  She subsequently underwent cardiac catheterization and cardioversion.  Catheterization showed segmental 40 to 50% eccentric proximal LAD stenosis with a widely patent LAD stent is a consideration of diagonal 2 bifurcation stenosis that appeared 50% in most views.  She underwent successful DC cardioversion on March 15, 2019 with restoration of sinus rhythm.  She apparently had undergone a sleep study in [place] on November 10, 2018.  When the patient saw Bunnie Domino, NP in follow-up of her hospitalization, because of concern for sleep apnea she was referred to see me for further sleep evaluation.  I was able  to obtain the results of the diagnostic polysomnogram which was done at [facility] in [place] on November 08, 2018.  This revealed only very mild sleep apnea with an AHI of 5.7 events per hour.  She did not have significant oxygen desaturation and her lowest oxygen saturation level was 88%.  She did not have any events during REM sleep.  Apparently the person who interpreted the study felt she had mild sleep apnea and recommended either weight loss, dental appliance, surgery, or possible CPAP therapy.  The patient states she was never notified of the results.    When I saw her for my initial evaluation in November 2020 she felt that she was sleeping well.  She rarely awakened gasping for breath.  She felt her sleep is restorative and denied any residual daytime sleepiness.    She does snore.  She denies hypnagogic hallucinations.  She is unaware of bruxism.  She denies sleep paralysis.   Epworth Sleepiness Scale: Situation   Chance of Dozing/Sleeping (0 = never , 1 = slight chance , 2 = moderate chance , 3 = high chance )   sitting and reading 1   watching TV 0   sitting inactive in a public place 0   being a passenger in a motor vehicle for an hour or more 0   lying down in the afternoon 0   sitting and talking to someone 0   sitting quietly after  lunch (no alcohol) 0   while stopped for a few minutes in traffic as the driver 0   Total Score  1   During that evaluation, since she was feeling fairly well I did not definitively encourage CPAP.  We discussed initial management with weight loss and increased exercise.  I felt she may be a candidate for customized oral dental appliance with her snoring which may lead to mandibular advancement but also she would benefit from CPAP therapy.  I gave her the opportunity to decide which treatment option she preferred.  Apparently, she decided initially to try CPAP.  Her CPAP set up date was November 11, 2019.  However, she states that she  never used the device since she did not want anything on her face.  As result her CPAP unit was picked up and she does not have therapy.  Presently she goes to bed between 930 and 10 PM and wakes up around 9 AM and sleeps approximately 10 to 11 hours.  She does snore.  She has nocturia 1-2 times per night.  A new Epworth Sleepiness Scale score was calculated in the office today and this endorsed at 10, consistent with daytime sleepiness.  She presents for reevaluation.  Past Medical History:  Diagnosis Date  . Anginal pain ([place])   . Anxiety   . Arthritis    "little in my thumbs"back & knees   . Breast cancer ([place])    R breast, treated with mastectomy/tomoxifen in early 2000s  . Bronchitis    hx of  . CAD (coronary artery disease)    The patient had a left heart catheterization in August 2006 showed an EF of 70%, a 70% mid LAD lesion and 80% distal LAD ledsion, as well as 50% ostial first diagonal lesion.  The patient did have a drug-eluting stent placed in her mid LAD at that time.    . Chest pressure 03/13/2019  . Depression   . Diastolic heart failure    Echo 11/10 with EF 55-60%, moderate diastolic dysfunction, no regional WMAs, mild to moderate TR, moderate LAE, mild AI  . Frequent urination at night   . GERD (gastroesophageal reflux disease)    "gone since gallbladder took out"  . Heart murmur    "small"  . Hypercholesterolemia   . Hypertension   . Hypothyroidism   . Mental disorder   . Sleep disturbance    sleep study done- told that there wasn't any apnea concerns, states she is up & down for use of bathroom several times per night   . Type II diabetes mellitus ([place])    "controlled w/diet and exercise"  . Urinary incontinence     Past Surgical History:  Procedure Laterality Date  . [place] VITRECTOMY WITH 20 GAUGE MVR PORT Right 06/04/2013   Procedure: 25 GAUGE PARS PLANA VITRECTOMY WITH 20 GAUGE MVR PORT / REMOVAL SILICONE OIL RIGHT EYE. With Endolaser;   Surgeon: Hayden Pedro, MD;  Location: [facility];  Service: Ophthalmology;  Laterality: Right;  . ABDOMINAL HYSTERECTOMY  1964  . Adenosine Myoview     9/09: EF 69% with normal perfusion images suggesting no ischemia or infarction.    . APPENDECTOMY  1954  . BACK SURGERY  06/2015   fusion-lumbar  . BREAST BIOPSY  2001   right  . CARDIAC CATHETERIZATION    . CARDIOVERSION N/A 03/15/2019   Procedure: CARDIOVERSION;  Surgeon: Fay Records, MD;  Location: [facility];  Service: Cardiovascular;  Laterality: N/A;  . CATARACT EXTRACTION W/ INTRAOCULAR LENS  IMPLANT, BILATERAL  ~ 2000  . CHOLECYSTECTOMY  ~ 2004  . COLONOSCOPY    . CORONARY ANGIOPLASTY    . CORONARY ANGIOPLASTY WITH STENT PLACEMENT  2000's   "1"  . DILATION AND CURETTAGE OF UTERUS  1960's   "I had 3"  . EYE SURGERY     /iol    following cataract removal  . GAS/FLUID EXCHANGE Right 06/04/2013   Procedure: GAS/FLUID EXCHANGE;  Surgeon: Hayden Pedro, MD;  Location: [facility];  Service: Ophthalmology;  Laterality: Right;  . LEFT HEART CATH AND CORONARY ANGIOGRAPHY N/A 03/14/2019   Procedure: LEFT HEART CATH AND CORONARY ANGIOGRAPHY;  Surgeon: Belva Crome, MD;  Location: [facility];  Service: Cardiovascular;  Laterality: N/A;  . MASTECTOMY  2001   right breast  . MEMBRANE PEEL Right 06/04/2013   Procedure: MEMBRANE PEEL;  Surgeon: Hayden Pedro, MD;  Location: [facility];  Service: Ophthalmology;  Laterality: Right;  . NEUROPLASTY / TRANSPOSITION MEDIAN NERVE AT CARPAL TUNNEL BILATERAL  1990's  . PARS PLANA VITRECTOMY  04/03/2012   right  . PARS PLANA VITRECTOMY  04/03/2012   Procedure: PARS PLANA VITRECTOMY WITH 25 GAUGE;  Surgeon: Hayden Pedro, MD;  Location: [facility];  Service: Ophthalmology;  Laterality: Right;  Silicone Oil, Perfluron, Repair Complex Traction Retinal Detachment  . PLANTAR FASCIA RELEASE  1990's   left  . TONSILLECTOMY AND ADENOIDECTOMY  1972  . TOTAL KNEE ARTHROPLASTY Left 10/13/2015   Procedure:  TOTAL KNEE ARTHROPLASTY;  Surgeon: Melrose Nakayama, MD;  Location: [facility];  Service: Orthopedics;  Laterality: Left;  . TOTAL KNEE ARTHROPLASTY Right 09/20/2016   Procedure: TOTAL KNEE ARTHROPLASTY;  Surgeon: Melrose Nakayama, MD;  Location: [facility];  Service: Orthopedics;  Laterality: Right;  . ULNAR NERVE REPAIR     left arm    Current Medications: Outpatient Medications Prior to Visit  Medication Sig Dispense Refill  . apixaban (ELIQUIS) 5 MG TABS tablet Take 1 tablet (5 mg total) by mouth 2 (two) times daily. 180 tablet 3  . diltiazem (CARDIZEM CD) 180 MG 24 hr capsule Take 1 capsule (180 mg total) by mouth daily. 90 capsule 3  . furosemide (LASIX) 40 MG tablet Take 1 tablet (40 mg total) by mouth daily. 30 tablet 3  . isosorbide mononitrate (IMDUR) 120 MG 24 hr tablet Take 120 mg by mouth Daily.     Marland Kitchen levothyroxine (SYNTHROID) 75 MCG tablet Take 75 mcg by mouth daily.    Marland Kitchen losartan (COZAAR) 100 MG tablet Take 100 mg by mouth daily.    . metoprolol succinate (TOPROL-XL) 50 MG 24 hr tablet Take 1 tablet (50 mg total) by mouth daily. Take with or immediately following a meal. 30 tablet 0  . traZODone (DESYREL) 150 MG tablet Take 225 mg by mouth at bedtime.     Marland Kitchen venlafaxine XR (EFFEXOR-XR) 150 MG 24 hr capsule Take 150 mg by mouth 2 (two) times a day.     . spironolactone (ALDACTONE) 25 MG tablet Take 1 tablet (25 mg total) by mouth daily. 30 tablet 3   No facility-administered medications prior to visit.     Allergies:   No known allergies   Social History   Socioeconomic History  . Marital status: Married    Spouse name: Not on file  . Number of children: Not on file  . Years of education: Not on file  . Highest education level: Not on  file  Occupational History  . Occupation: Hotel manager: RETIRED    Comment: part-time  Tobacco Use  . Smoking status: Never Smoker  . Smokeless tobacco: Never Used  Vaping Use  . Vaping Use: Never used  Substance and Sexual  Activity  . Alcohol use: No  . Drug use: No  . Sexual activity: Not Currently  Other Topics Concern  . Not on file  Social History Narrative  . Not on file   Social Determinants of Health   Financial Resource Strain:   . Difficulty of Paying Living Expenses:   Food Insecurity:   . Worried About Charity fundraiser in the Last Year:   . Arboriculturist in the Last Year:   Transportation Needs:   . Film/video editor (Medical):   Marland Kitchen Lack of Transportation (Non-Medical):   Physical Activity:   . Days of Exercise per Week:   . Minutes of Exercise per Session:   Stress:   . Feeling of Stress :   Social Connections:   . Frequency of Communication with Friends and Family:   . Frequency of Social Gatherings with Friends and Family:   . Attends Religious Services:   . Active Member of Clubs or Organizations:   . Attends Archivist Meetings:   Marland Kitchen Marital Status:      Family History:  The patient's family history includes Alcoholism in her father; Cancer in her brother and mother.   ROS General: Negative; No fevers, chills, or night sweats;  HEENT: Negative; No changes in vision or hearing, sinus congestion, difficulty swallowing Pulmonary: Negative; No cough, wheezing, shortness of breath, hemoptysis Cardiovascular: See HPI GI: Negative; No nausea, vomiting, diarrhea, or abdominal pain GU: Negative; No dysuria, hematuria, or difficulty voiding Musculoskeletal: Negative; no myalgias, joint pain, or weakness Hematologic/Oncology: Negative; no easy bruising, bleeding Endocrine: Negative; no heat/cold intolerance; no diabetes Neuro: Negative; no changes in balance, headaches Skin: Negative; No rashes or skin lesions Psychiatric: Negative; No behavioral problems, depression Sleep:  snoring, no daytime sleepiness, hypersomnolence, bruxism, restless legs, hypnogognic hallucinations, no cataplexy Other comprehensive 14 point system review is negative.   PHYSICAL EXAM:     VS:  BP 130/70   Pulse 74   Temp 98.2 F (36.8 C)   Ht '5\' 3"'  (1.6 m)   Wt 184 lb (83.5 kg)   SpO2 94%   BMI 32.59 kg/m     Repeat blood pressure by me was 135/74  Wt Readings from Last 3 Encounters:  02/06/20 184 lb (83.5 kg)  01/29/20 184 lb (83.5 kg)  01/02/20 186 lb (84.4 kg)    General: Alert, oriented, no distress.  Skin: normal turgor, no rashes, warm and dry HEENT: Normocephalic, atraumatic. Pupils equal round and reactive to light; sclera anicteric; extraocular muscles intact;  Nose without nasal septal hypertrophy Mouth/Parynx benign; Mallinpatti scale 3 Neck: No JVD, no carotid bruits; normal carotid upstroke Lungs: clear to ausculatation and percussion; no wheezing or rales Chest wall: without tenderness to palpitation Heart: PMI not displaced, RRR, s1 s2 normal, 1/6 systolic murmur, no diastolic murmur, no rubs, gallops, thrills, or heaves Abdomen: soft, nontender; no hepatosplenomehaly, BS+; abdominal aorta nontender and not dilated by palpation. Back: no CVA tenderness Pulses 2+ Musculoskeletal: full range of motion, normal strength, no joint deformities Extremities: no clubbing cyanosis or edema, Homan's sign negative  Neurologic: grossly nonfocal; Cranial nerves grossly wnl Psychologic: Normal mood and affect   Studies/Labs Reviewed:   EKG:  EKG is ordered today.  ECG (independently read by me): Normal sinus rhythm at 79 bpm, right bundle branch block, left anterior hemiblock.  Recent Labs: BMP Latest Ref Rng & Units 11/08/2019 03/14/2019 03/13/2019  Glucose 65 - 99 mg/dL 96 98 117(H)  BUN 8 - 27 mg/dL '8 14 9  ' Creatinine 0.57 - 1.00 mg/dL 0.93 0.96 0.90  BUN/Creat Ratio 12 - 28 9(L) - -  Sodium 134 - 144 mmol/L 141 140 137  Potassium 3.5 - 5.2 mmol/L 4.7 4.7 3.2(L)  Chloride 96 - 106 mmol/L 101 109 104  CO2 20 - 29 mmol/L 26 19(L) 23  Calcium 8.7 - 10.3 mg/dL 9.9 9.2 9.5     Hepatic Function Latest Ref Rng & Units 03/13/2019 06/25/2015 05/16/2011   Total Protein 6.5 - 8.1 g/dL 7.2 7.2 7.4  Albumin 3.5 - 5.0 g/dL 4.0 3.5 4.4  AST 15 - 41 U/L '21 26 23  ' ALT 0 - 44 U/L '21 17 18  ' Alk Phosphatase 38 - 126 U/L 69 77 68  Total Bilirubin 0.3 - 1.2 mg/dL 0.9 0.4 0.7  Bilirubin, Direct 0.0 - 0.3 mg/dL - - 0.1    CBC Latest Ref Rng & Units 03/16/2019 03/15/2019 03/14/2019  WBC 4.0 - 10.5 K/uL 5.7 6.2 7.9  Hemoglobin 12.0 - 15.0 g/dL 13.5 14.1 15.6(H)  Hematocrit 36 - 46 % 40.7 41.6 46.0  Platelets 150 - 400 K/uL 239 250 172   Lab Results  Component Value Date   MCV 94.2 03/16/2019   MCV 93.3 03/15/2019   MCV 93.5 03/14/2019   Lab Results  Component Value Date   TSH 7.938 (H) 03/12/2019   Lab Results  Component Value Date   HGBA1C 5.4 09/13/2016     BNP    Component Value Date/Time   BNP 635.2 (H) 03/13/2019 1018    ProBNP No results found for: PROBNP   Lipid Panel     Component Value Date/Time   CHOL 204 (H) 03/15/2019 0545   TRIG 128 03/15/2019 0545   HDL 51 03/15/2019 0545   CHOLHDL 4.0 03/15/2019 0545   VLDL 26 03/15/2019 0545   LDLCALC 127 (H) 03/15/2019 0545     RADIOLOGY: LONG TERM MONITOR-LIVE TELEMETRY (3-14 DAYS)  Result Date: 01/23/2020  The background rhythm is normal sinus with normal circadian varaiation.  There are occasional PACs and there are occasional episodes of atrial tachycardia (some sustained, but all very brief).  There is no true atrial fibrillation.  There is no significant bradycardia and no ventricular arrhythmia.  There is no association between the patient symptoms and the episodes of atrial arrhythmia. Abnormal event monitor due to occasional PACs and SVT (brief ectopic atrial tachycardia). There is no clear association between the patient symptoms and the arrhythmia.      Additional studies/ records that were reviewed today include:  I reviewed the patient's recent hospitalization records as well as office records.  I reviewed the diagnostic polysomnogram from [facility]  sleep center.   ASSESSMENT:    1. PAF (paroxysmal atrial fibrillation) ([place])   2. OSA (obstructive sleep apnea)   3. Essential hypertension   4. Mild obesity     PLAN:  Ms. Harlyn "[name]" Leiva is a pleasant 86 year old female who has a history known CAD status post DES stenting 10/02/2004, type 2 diabetes mellitus, obesity, diastolic heart failure with normal EF and grade 2 diastolic dysfunction on echocardiography as well as recent atrial fibrillation and is status post cardioversion.  Since her hospitalization  and recurrent catheterization study in July 2020 she has not had any recurrent anginal symptomatology.  In March 2020 she underwent her initial sleep evaluation at [facility] sleep center which revealed only very mild sleep apnea with an AHI of 5.7.  She had significantly prolonged latency to REM sleep at 270 minutes and had reduction in REM sleep but still slept 12% of the night in REM sleep.  A as result the t my initial evaluation I did not strongly encourage CPAP therapy but discussed initial options of weight loss, exercise, and potential future consideration of possible oral appliance for consideration of CPAP.  Apparently she opted for the CPAP approach but when she received her machine with a set up date on November 11, 2019 she states she never used it.  As result due to lack of compliance the machine was picked up and she no longer has therapy.  Presently, she admits that she snores and has nocturia 1-2 times per night.  I recalculated an Epworth sleepiness score today which endorsed at 10 consistent with excessive daytime sleepiness.  I discussed optimal sleep duration at least 8 hours per night if at all possible but apparently she has been sleeping 10 to 11 hours per night.  We discussed possible consideration for customized oral appliance with mandibular advancement and if she believes she would like to pursue this I will refer her to Dr. Augustina Mood.  Otherwise, if she is  sleeping well she will just need to be followed for very mild sleep apnea and if symptoms progress definitive treatment may be necessary in the future.  She will contemplate her options.  Presently her blood pressure is relatively controlled on diltiazem 180 mg, losartan 100 mg, spironolactone 25 mg in addition to her isosorbide 120 mg.  She is not having any anginal symptomatology.  She continues to be on Eliquis for anticoagulation.  She has a history of hypothyroidism on levothyroxine replacement at 75 mcg.  She continues to be on Effexor ER.  She will will return to the care of Dr. Recardo Evangelist.  I will see her back in the office PRN depending upon future symptomatology.    Medication Adjustments/Labs and Tests Ordered: Current medicines are reviewed at length with the patient today.  Concerns regarding medicines are outlined above.  Medication changes, Labs and Tests ordered today are listed in the Patient Instructions below. Patient Instructions  Medication Instructions:  CONTINUE WITH CURRENT MEDICATIONS. NO CHANGES.  *If you need a refill on your cardiac medications before your next appointment, please call your pharmacy*   Follow-Up: At [facility], you and your health needs are our priority.  As part of our continuing mission to provide you with exceptional heart care, we have created designated Provider Care Teams.  These Care Teams include your primary Cardiologist (physician) and Advanced Practice Providers (APPs -  Physician Assistants and Nurse Practitioners) who all work together to provide you with the care you need, when you need it.  We recommend signing up for the patient portal called "MyChart".  Sign up information is provided on this After Visit Summary.  MyChart is used to connect with patients for Virtual Visits (Telemedicine).  Patients are able to view lab/test results, encounter notes, upcoming appointments, etc.  Non-urgent messages can be sent to your provider as well.     To learn more about what you can do with MyChart, go to [email or web address].    Your next appointment:   FOLLOW UP AS NEEDED WITH DR.Sherice Ijames  Signed, Shelva Majestic, MD  02/08/2020 3:14 PM    [facility] [address], [place], [place], [place]  [address] Phone: [phone]

## 2020-02-13 LAB — LIPID PANEL
Chol/HDL Ratio: 3.6 ratio (ref 0.0–4.4)
Cholesterol, Total: 203 mg/dL — ABNORMAL HIGH (ref 100–199)
HDL: 57 mg/dL (ref >39)
LDL Chol Calc (NIH): 127 mg/dL — ABNORMAL HIGH (ref 0–99)
Triglycerides: 106 mg/dL (ref 0–149)
VLDL Cholesterol Cal: 19 mg/dL (ref 5–40)

## 2020-02-13 LAB — BASIC METABOLIC PANEL
BUN/Creatinine Ratio: 10 — ABNORMAL LOW (ref 12–28)
BUN: 9 mg/dL (ref 8–27)
CO2: 23 mmol/L (ref 20–29)
Calcium: 9.5 mg/dL (ref 8.7–10.3)
Chloride: 101 mmol/L (ref 96–106)
Creatinine, Ser: 0.93 mg/dL (ref 0.57–1.00)
GFR calc Af Amer: 65 mL/min/{1.73_m2} (ref >59)
GFR calc non Af Amer: 57 mL/min/{1.73_m2} — ABNORMAL LOW (ref >59)
Glucose: 114 mg/dL — ABNORMAL HIGH (ref 65–99)
Potassium: 4.4 mmol/L (ref 3.5–5.2)
Sodium: 142 mmol/L (ref 134–144)

## 2020-02-13 LAB — BRAIN NATRIURETIC PEPTIDE: BNP: <2.5 pg/mL (ref 0.0–100.0)

## 2020-02-14 ENCOUNTER — Other Ambulatory Visit: Payer: Self-pay | Admitting: *Deleted

## 2020-02-14 MED ORDER — ATORVASTATIN CALCIUM 40 MG PO TABS
40.0000 mg | ORAL_TABLET | Freq: Every day | ORAL | 3 refills | Status: DC
Start: 2020-02-14 — End: 2021-01-26

## 2020-02-25 NOTE — Progress Notes (Signed)
Cardiology Office Note   Date:  02/26/2020   ID:  [name] [name], DOB 1933/09/28, MRN [id number]  PCP:  [facility]  Cardiologist:  Dr. Sallyanne Kuster  CC: Follow up   History of Present Illness: [name] [name] is a 86 y.o. female who presents for ongoing assessment and management of coronary artery disease previously followed by Dr. Sallyanne Kuster and Dr. Loralie Champagne.  The patient has drug-eluting stent to her LAD placed in 2006.  Catheterization 2010 revealed patent proximal LAD stent with 50% mid LAD stenosis.  Echocardiogram in 2014 revealed an EF of 60% to 65% with grade 2 diastolic dysfunction mild AR and mildly dilated left atrium.  She developed palpitations March 2020 and was evaluated by Dr. Dwyane Dee at [facility] and was found to be in atrial fibrillation.  Eliquis was initiated and she was also started on aspirin and metoprolol.  She presented to the hospital in July 2020 with increasing shortness of breath and chest tightness described as a "weight sitting on my chest".  She underwent repeat cardiac catheterization and cardioversion.  Cardiac catheterization revealed segmental 40% to 50% eccentric proximal LAD stenosis and a widely patent LAD stent.  Consideration of diagonal 2 bifurcation stenosis that appeared to be 50% in most views.  She underwent successful DC cardioversion on March 15, 2019 with restoration of sinus rhythm.  She was evaluated for OSA.  Dr. Claiborne Billings reviewed prior sleep study completed by [facility] sleep center in [place] in March 2020.  This revealed very only mild sleep apnea with an AHI of 5.7 events per hour.  On evaluation by Dr. Claiborne Billings she was feeling well and he did not order CPAP.  He felt that she could be a candidate for customized oral dental appliance due to snoring which may lead to a mandibular advancement but also would benefit from CPAP therapy.  Mrs. Henkes decided to initially try CPAP.  However she did not tolerate it as she did not want anything on her  face.  On last visit with Dr. Claiborne Billings on 02/06/2020 she was continued on her current medication regimen, she was to follow-up with Dr. Augustina Mood at [facility] sleep center for more recommendations and to be followed.  She was to see Dr. Claiborne Billings as needed.  She continues to refuse CPAP.  She comes today with her sister.  She offers no complaints of chest pain, she does have some dyspnea on exertion.  She is not very active due to chronic leg pain.  She does use a cane for ambulation.  She does complain of periods of depression associated with her health status.  She states that she has "down days" every now and then and spends time with her family to bring her spirits up.  Past Medical History:  Diagnosis Date  . Anginal pain ([place])   . Anxiety   . Arthritis    "little in my thumbs"back & knees   . Breast cancer ([place])    R breast, treated with mastectomy/tomoxifen in early 2000s  . Bronchitis    hx of  . CAD (coronary artery disease)    The patient had a left heart catheterization in August 2006 showed an EF of 70%, a 70% mid LAD lesion and 80% distal LAD ledsion, as well as 50% ostial first diagonal lesion.  The patient did have a drug-eluting stent placed in her mid LAD at that time.    . Chest pressure 03/13/2019  . Depression   . Diastolic heart  failure    Echo 11/10 with EF 55-60%, moderate diastolic dysfunction, no regional WMAs, mild to moderate TR, moderate LAE, mild AI  . Frequent urination at night   . GERD (gastroesophageal reflux disease)    "gone since gallbladder took out"  . Heart murmur    "small"  . Hypercholesterolemia   . Hypertension   . Hypothyroidism   . Mental disorder   . Sleep disturbance    sleep study done- told that there wasn't any apnea concerns, states she is up & down for use of bathroom several times per night   . Type II diabetes mellitus ([place])    "controlled w/diet and exercise"  . Urinary incontinence     Past Surgical History:  Procedure  Laterality Date  . [place] VITRECTOMY WITH 20 GAUGE MVR PORT Right 06/04/2013   Procedure: 25 GAUGE PARS PLANA VITRECTOMY WITH 20 GAUGE MVR PORT / REMOVAL SILICONE OIL RIGHT EYE. With Endolaser;  Surgeon: Hayden Pedro, MD;  Location: [place];  Service: Ophthalmology;  Laterality: Right;  . ABDOMINAL HYSTERECTOMY  1964  . Adenosine Myoview     9/09: EF 69% with normal perfusion images suggesting no ischemia or infarction.    . APPENDECTOMY  1954  . BACK SURGERY  06/2015   fusion-lumbar  . BREAST BIOPSY  2001   right  . CARDIAC CATHETERIZATION    . CARDIOVERSION N/A 03/15/2019   Procedure: CARDIOVERSION;  Surgeon: Fay Records, MD;  Location: [place];  Service: Cardiovascular;  Laterality: N/A;  . CATARACT EXTRACTION W/ INTRAOCULAR LENS  IMPLANT, BILATERAL  ~ 2000  . CHOLECYSTECTOMY  ~ 2004  . COLONOSCOPY    . CORONARY ANGIOPLASTY    . CORONARY ANGIOPLASTY WITH STENT PLACEMENT  2000's   "1"  . DILATION AND CURETTAGE OF UTERUS  1960's   "I had 3"  . EYE SURGERY     /iol    following cataract removal  . GAS/FLUID EXCHANGE Right 06/04/2013   Procedure: GAS/FLUID EXCHANGE;  Surgeon: Hayden Pedro, MD;  Location: [place];  Service: Ophthalmology;  Laterality: Right;  . LEFT HEART CATH AND CORONARY ANGIOGRAPHY N/A 03/14/2019   Procedure: LEFT HEART CATH AND CORONARY ANGIOGRAPHY;  Surgeon: Belva Crome, MD;  Location: [facility];  Service: Cardiovascular;  Laterality: N/A;  . MASTECTOMY  2001   right breast  . MEMBRANE PEEL Right 06/04/2013   Procedure: MEMBRANE PEEL;  Surgeon: Hayden Pedro, MD;  Location: [place];  Service: Ophthalmology;  Laterality: Right;  . NEUROPLASTY / TRANSPOSITION MEDIAN NERVE AT CARPAL TUNNEL BILATERAL  1990's  . PARS PLANA VITRECTOMY  04/03/2012   right  . PARS PLANA VITRECTOMY  04/03/2012   Procedure: PARS PLANA VITRECTOMY WITH 25 GAUGE;  Surgeon: Hayden Pedro, MD;  Location: [place];  Service: Ophthalmology;  Laterality: Right;   Silicone Oil, Perfluron, Repair Complex Traction Retinal Detachment  . PLANTAR FASCIA RELEASE  1990's   left  . TONSILLECTOMY AND ADENOIDECTOMY  1972  . TOTAL KNEE ARTHROPLASTY Left 10/13/2015   Procedure: TOTAL KNEE ARTHROPLASTY;  Surgeon: Melrose Nakayama, MD;  Location: [place];  Service: Orthopedics;  Laterality: Left;  . TOTAL KNEE ARTHROPLASTY Right 09/20/2016   Procedure: TOTAL KNEE ARTHROPLASTY;  Surgeon: Melrose Nakayama, MD;  Location: [place];  Service: Orthopedics;  Laterality: Right;  . ULNAR NERVE REPAIR     left arm     Current Outpatient Medications  Medication Sig Dispense Refill  . apixaban (ELIQUIS)  5 MG TABS tablet Take 1 tablet (5 mg total) by mouth 2 (two) times daily. 180 tablet 3  . atorvastatin (LIPITOR) 40 MG tablet Take 1 tablet (40 mg total) by mouth daily. 90 tablet 3  . diltiazem (CARDIZEM CD) 180 MG 24 hr capsule Take 1 capsule (180 mg total) by mouth daily. 90 capsule 3  . furosemide (LASIX) 40 MG tablet Take 1 tablet (40 mg total) by mouth daily. 30 tablet 3  . isosorbide mononitrate (IMDUR) 120 MG 24 hr tablet Take 120 mg by mouth Daily.     Marland Kitchen levothyroxine (SYNTHROID) 75 MCG tablet Take 75 mcg by mouth daily.    Marland Kitchen losartan (COZAAR) 100 MG tablet Take 100 mg by mouth daily.    . metoprolol succinate (TOPROL-XL) 50 MG 24 hr tablet Take 1 tablet (50 mg total) by mouth daily. Take with or immediately following a meal. 30 tablet 0  . traZODone (DESYREL) 150 MG tablet Take 225 mg by mouth at bedtime.     Marland Kitchen venlafaxine XR (EFFEXOR-XR) 150 MG 24 hr capsule Take 150 mg by mouth 2 (two) times a day.     . spironolactone (ALDACTONE) 25 MG tablet Take 1 tablet (25 mg total) by mouth daily. 30 tablet 3   No current facility-administered medications for this visit.    Allergies:   No known allergies    Social History:  The patient  reports that she has never smoked. She has never used smokeless tobacco. She reports that she does not drink alcohol and does not use drugs.    Family History:  The patient's family history includes Alcoholism in her father; Cancer in her brother and mother.    ROS: All other systems are reviewed and negative. Unless otherwise mentioned in H&P    PHYSICAL EXAM: VS:  BP (!) 150/84   Pulse 69   Ht 5\' 3"  (1.6 m)   Wt 186 lb 3.2 oz (84.5 kg)   SpO2 96%   BMI 32.98 kg/m  , BMI Body mass index is 32.98 kg/m. GEN: Well nourished, well developed, in no acute distress HEENT: normal Neck: no JVD, carotid bruits, or masses Cardiac: RRR; no murmurs, rubs, or gallops,no edema  Respiratory:  Clear to auscultation bilaterally, normal work of breathing GI: soft, nontender, nondistended, + BS MS: no deformity or atrophy, mild kyphosis. Skin: warm and dry, no rash Neuro:  Strength is diminished but sensation are intact Psych: euthymic mood, full affect   EKG: Not completed this office visit.   Recent Labs: 03/12/2019: Magnesium 2.2; TSH 7.938 03/13/2019: ALT 21 03/16/2019: Hemoglobin 13.5; Platelets 239 02/12/2020: BNP <2.5; BUN 9; Creatinine, Ser 0.93; Potassium 4.4; Sodium 142    Lipid Panel    Component Value Date/Time   CHOL 203 (H) 02/12/2020 0812   TRIG 106 02/12/2020 0812   HDL 57 02/12/2020 0812   CHOLHDL 3.6 02/12/2020 0812   CHOLHDL 4.0 03/15/2019 0545   VLDL 26 03/15/2019 0545   LDLCALC 127 (H) 02/12/2020 0812      Wt Readings from Last 3 Encounters:  02/26/20 186 lb 3.2 oz (84.5 kg)  02/06/20 184 lb (83.5 kg)  01/29/20 184 lb (83.5 kg)      Other studies Reviewed: LHC 03/13/2020  Normal left main  Proximal eccentric 30 to 50% LAD stenoses.  Diffuse 20 to 30% in-stent restenosis in mid LAD, and eccentric 40 to 60% [place] 011 bifurcation stenosis with a distal second diagonal.  The apical LAD contains mild to moderate  diffuse disease.  Circumflex gives a large branching first obtuse marginal, and prior to a significant angulation in the mid marginal there is 30% narrowing.  Right coronary is dominant  and contains eccentric 30% ostial PDA.  Low normal LV systolic function with EF 45 to 50%.  Normal LVEDP.  Stress Test 01/02/2020  There was no ST segment deviation noted during stress.  The left ventricular ejection fraction is hyperdynamic (>65%).  Nuclear stress EF: 66%.  Defect 1: There is a medium defect of mild severity present in the basal inferior, mid inferior and apical inferior location.  Defect 2: There is a medium defect of moderate severity present in the apical anterior, apical lateral and apex location.  Findings consistent with prior myocardial infarction vs artifact  This is a low risk study. Large fixed defect, but normal EF. No ischemia.   Fixed inferior and apical perfusion defects, with normal wall motion in these areas, suggestive of artifact.   ASSESSMENT AND PLAN:  1.  PAF: Remains on diltiazem, apixaban, and metoprolol.  Heart rate is regular today.  She does have a history of DCCV 02/2019 which was successful.  No changes in her current medication regimen.  She is medically compliant and has no complaints of bleeding or medication intolerance.  2.  CAD: Cardiac catheterization July 2020 revealed segmental 40% to 50% eccentric proximal LAD stenosis, 80 widely patent is LAD stent.  She denies any recurrent discomfort.  Dyspnea is occurring with exertion only without associated weakness or chest pain.  Continue secondary prevention.  3.  Hyperlipidemia: Continue atorvastatin 40 mg daily.  I am ordering repeat fasting lipids and LFTs for ongoing surveillance.  Goal of LDL less than 70.  4.  "Borderline diabetes": Patient requests hemoglobin A1c with blood draw.  She states she eats a lot of gourmet peanut butter cookies.  She would like to know her status.  5.  Hypertension: Blood pressure slightly elevated in this office visit.  Blood pressure was taken after she had trouble climbing up onto the table.  Blood pressure is normally in the 130 over 70 range.  This  is also documented on most recent office visit.  No changes in her regimen.   Current medicines are reviewed at length with the patient today.  I have spent 25 minutes dedicated to the care of this patient on the date of this encounter to include pre-visit review of records, assessment, management and diagnostic testing,with shared decision making.  Labs/ tests ordered today include: Fasting lipids and LFTs, hemoglobin A1c. Phill Myron. [place] Pugh, ANP, AACC   02/26/2020 4:28 PM    [facility] [place] [address] Office [phone] Fax [phone]  Notice: This dictation was prepared with Dragon dictation along with smaller phrase technology. Any transcriptional errors that result from this process are unintentional and may not be corrected upon review.

## 2020-02-26 ENCOUNTER — Other Ambulatory Visit: Payer: Self-pay

## 2020-02-26 ENCOUNTER — Encounter: Payer: Self-pay | Admitting: Adult Health

## 2020-02-26 ENCOUNTER — Ambulatory Visit: Payer: Medicare Other | Admitting: Adult Health

## 2020-02-26 VITALS — BP 150/84 | HR 69 | Ht 63.0 in | Wt 186.2 lb

## 2020-02-26 DIAGNOSIS — R0600 Dyspnea, unspecified: Secondary | ICD-10-CM

## 2020-02-26 DIAGNOSIS — E119 Type 2 diabetes mellitus without complications: Secondary | ICD-10-CM

## 2020-02-26 DIAGNOSIS — I10 Essential (primary) hypertension: Secondary | ICD-10-CM

## 2020-02-26 DIAGNOSIS — G4733 Obstructive sleep apnea (adult) (pediatric): Secondary | ICD-10-CM

## 2020-02-26 DIAGNOSIS — E7800 Pure hypercholesterolemia, unspecified: Secondary | ICD-10-CM

## 2020-02-26 DIAGNOSIS — I480 Paroxysmal atrial fibrillation: Secondary | ICD-10-CM

## 2020-02-26 DIAGNOSIS — R0609 Other forms of dyspnea: Secondary | ICD-10-CM

## 2020-02-26 DIAGNOSIS — Z79899 Other long term (current) drug therapy: Secondary | ICD-10-CM

## 2020-02-26 NOTE — Patient Instructions (Signed)
Medication Instructions:  Continue current medication  *If you need a refill on your cardiac medications before your next appointment, please call your pharmacy*   Lab Work: Fasting Lipid Liver and HgB A1C  If you have labs (blood work) drawn today and your tests are completely normal, you will receive your results only by: Marland Kitchen MyChart Message (if you have MyChart) OR . A paper copy in the mail If you have any lab test that is abnormal or we need to change your treatment, we will call you to review the results.   Testing/Procedures: None Ordered   Follow-Up: At [facility], you and your health needs are our priority.  As part of our continuing mission to provide you with exceptional heart care, we have created designated Provider Care Teams.  These Care Teams include your primary Cardiologist (physician) and Advanced Practice Providers (APPs -  Physician Assistants and Nurse Practitioners) who all work together to provide you with the care you need, when you need it.  We recommend signing up for the patient portal called "MyChart".  Sign up information is provided on this After Visit Summary.  MyChart is used to connect with patients for Virtual Visits (Telemedicine).  Patients are able to view lab/test results, encounter notes, upcoming appointments, etc.  Non-urgent messages can be sent to your provider as well.   To learn more about what you can do with MyChart, go to [email or web address].    Your next appointment:   6 month(s)  The format for your next appointment:   In Person  Provider:   You may see [name], MD or one of the following Advanced Practice Providers on your designated Care Team:    Almyra Deforest, PA-C  Fabian Sharp, PA-C or   Roby Lofts, [place]

## 2020-02-26 NOTE — Progress Notes (Signed)
TY

## 2020-06-18 ENCOUNTER — Telehealth: Payer: Self-pay | Admitting: Cardiovascular Disease

## 2020-06-18 NOTE — Telephone Encounter (Signed)
Received call from Migdalia Dk, NP w/[facility] she states that she is at this pt's house and c/o intermittent CP-medsternal (none since yesterday), chest pressure, SOB, Fatigue,nausea and she notices new unsteady gait. Her BP today is 140/93 HR 85-irregular (pt can feel this) O2 96%-room air. Pt states that she is taking all her medications as directed.  NP will call in SL-nitro. There are no available appts until mid-December, declines making an appointment at this time. She will have daughter call to make her f/u appointment at a later time. NP verbalizes will have pt go to the ER if CP persists.  She just would like message forwarded to Dr C, notified that [place] now. Verbalizes understanding

## 2020-06-18 NOTE — Telephone Encounter (Signed)
Pt c/o Shortness Of Breath: STAT if SOB developed within the last 24 hours or pt is noticeably SOB on the phone  1. Are you currently SOB (can you hear that pt is SOB on the phone)? Yes  2. How long have you been experiencing SOB? A week; getting gradually worse  3. Are you SOB when sitting or when up moving around? Both   4. Are you currently experiencing any other symptoms? Fatigue

## 2020-06-25 ENCOUNTER — Emergency Department (HOSPITAL_COMMUNITY): Payer: Medicare Other

## 2020-06-25 ENCOUNTER — Other Ambulatory Visit: Payer: Self-pay

## 2020-06-25 ENCOUNTER — Observation Stay (HOSPITAL_COMMUNITY)
Admission: EM | Admit: 2020-06-25 | Discharge: 2020-06-26 | Disposition: A | Discharge status: Home / Self Care | Payer: Medicare Other | Attending: Internal Medicine | Admitting: Internal Medicine

## 2020-06-25 ENCOUNTER — Encounter (HOSPITAL_COMMUNITY): Payer: Self-pay | Admitting: Emergency Medicine

## 2020-06-25 DIAGNOSIS — W19XXXA Unspecified fall, initial encounter: Secondary | ICD-10-CM | POA: Diagnosis not present

## 2020-06-25 DIAGNOSIS — Z96653 Presence of artificial knee joint, bilateral: Secondary | ICD-10-CM | POA: Insufficient documentation

## 2020-06-25 DIAGNOSIS — I5032 Chronic diastolic (congestive) heart failure: Secondary | ICD-10-CM | POA: Insufficient documentation

## 2020-06-25 DIAGNOSIS — U071 COVID-19: Secondary | ICD-10-CM | POA: Insufficient documentation

## 2020-06-25 DIAGNOSIS — I2510 Atherosclerotic heart disease of native coronary artery without angina pectoris: Secondary | ICD-10-CM | POA: Diagnosis not present

## 2020-06-25 DIAGNOSIS — Z7901 Long term (current) use of anticoagulants: Secondary | ICD-10-CM | POA: Diagnosis not present

## 2020-06-25 DIAGNOSIS — F418 Other specified anxiety disorders: Secondary | ICD-10-CM

## 2020-06-25 DIAGNOSIS — E039 Hypothyroidism, unspecified: Secondary | ICD-10-CM | POA: Diagnosis not present

## 2020-06-25 DIAGNOSIS — Z9861 Coronary angioplasty status: Secondary | ICD-10-CM

## 2020-06-25 DIAGNOSIS — Z23 Encounter for immunization: Secondary | ICD-10-CM | POA: Diagnosis not present

## 2020-06-25 DIAGNOSIS — Y92009 Unspecified place in unspecified non-institutional (private) residence as the place of occurrence of the external cause: Secondary | ICD-10-CM | POA: Diagnosis not present

## 2020-06-25 DIAGNOSIS — Z853 Personal history of malignant neoplasm of breast: Secondary | ICD-10-CM | POA: Diagnosis not present

## 2020-06-25 DIAGNOSIS — I10 Essential (primary) hypertension: Secondary | ICD-10-CM | POA: Diagnosis not present

## 2020-06-25 DIAGNOSIS — I110 Hypertensive heart disease with heart failure: Secondary | ICD-10-CM | POA: Insufficient documentation

## 2020-06-25 DIAGNOSIS — I480 Paroxysmal atrial fibrillation: Secondary | ICD-10-CM | POA: Diagnosis present

## 2020-06-25 DIAGNOSIS — E119 Type 2 diabetes mellitus without complications: Secondary | ICD-10-CM | POA: Insufficient documentation

## 2020-06-25 DIAGNOSIS — Z79899 Other long term (current) drug therapy: Secondary | ICD-10-CM | POA: Insufficient documentation

## 2020-06-25 DIAGNOSIS — R079 Chest pain, unspecified: Secondary | ICD-10-CM | POA: Insufficient documentation

## 2020-06-25 LAB — URINALYSIS, ROUTINE W REFLEX MICROSCOPIC
Bacteria, UA: NONE SEEN
Bilirubin Urine: NEGATIVE
Glucose, UA: NEGATIVE mg/dL
Ketones, ur: NEGATIVE mg/dL
Nitrite: NEGATIVE
Protein, ur: NEGATIVE mg/dL
Specific Gravity, Urine: 1.003 — ABNORMAL LOW (ref 1.005–1.030)
pH: 7 (ref 5.0–8.0)

## 2020-06-25 LAB — COMPREHENSIVE METABOLIC PANEL
ALT: 23 U/L (ref 0–44)
AST: 26 U/L (ref 15–41)
Albumin: 3.9 g/dL (ref 3.5–5.0)
Alkaline Phosphatase: 60 U/L (ref 38–126)
Anion gap: 12 (ref 5–15)
BUN: 9 mg/dL (ref 8–23)
CO2: 22 mmol/L (ref 22–32)
Calcium: 9.5 mg/dL (ref 8.9–10.3)
Chloride: 101 mmol/L (ref 98–111)
Creatinine, Ser: 0.91 mg/dL (ref 0.44–1.00)
GFR, Estimated: >60 mL/min (ref >60)
Glucose, Bld: 119 mg/dL — ABNORMAL HIGH (ref 70–99)
Potassium: 3.3 mmol/L — ABNORMAL LOW (ref 3.5–5.1)
Sodium: 135 mmol/L (ref 135–145)
Total Bilirubin: 1 mg/dL (ref 0.3–1.2)
Total Protein: 7.1 g/dL (ref 6.5–8.1)

## 2020-06-25 LAB — CBC WITH DIFFERENTIAL/PLATELET
Abs Immature Granulocytes: 0.03 10*3/uL (ref 0.00–0.07)
Basophils Absolute: 0 10*3/uL (ref 0.0–0.1)
Basophils Relative: 0 %
Eosinophils Absolute: 0 10*3/uL (ref 0.0–0.5)
Eosinophils Relative: 0 %
HCT: 45 % (ref 36.0–46.0)
Hemoglobin: 15.4 g/dL — ABNORMAL HIGH (ref 12.0–15.0)
Immature Granulocytes: 0 %
Lymphocytes Relative: 8 %
Lymphs Abs: 0.8 10*3/uL (ref 0.7–4.0)
MCH: 30.7 pg (ref 26.0–34.0)
MCHC: 34.2 g/dL (ref 30.0–36.0)
MCV: 89.8 fL (ref 80.0–100.0)
Monocytes Absolute: 0.7 10*3/uL (ref 0.1–1.0)
Monocytes Relative: 7 %
Neutro Abs: 8.5 10*3/uL — ABNORMAL HIGH (ref 1.7–7.7)
Neutrophils Relative %: 85 %
Platelets: 268 10*3/uL (ref 150–400)
RBC: 5.01 MIL/uL (ref 3.87–5.11)
RDW: 13.1 % (ref 11.5–15.5)
WBC: 10.1 10*3/uL (ref 4.0–10.5)
nRBC: 0 % (ref 0.0–0.2)

## 2020-06-25 LAB — TROPONIN I (HIGH SENSITIVITY)
Troponin I (High Sensitivity): 27 ng/L — ABNORMAL HIGH (ref <18)
Troponin I (High Sensitivity): 32 ng/L — ABNORMAL HIGH (ref <18)

## 2020-06-25 LAB — MAGNESIUM: Magnesium: 2 mg/dL (ref 1.7–2.4)

## 2020-06-25 LAB — CBG MONITORING, ED: Glucose-Capillary: 111 mg/dL — ABNORMAL HIGH (ref 70–99)

## 2020-06-25 MED ORDER — ATORVASTATIN CALCIUM 40 MG PO TABS
40.0000 mg | ORAL_TABLET | Freq: Every day | ORAL | Status: DC
  Administered 2020-06-26: 40 mg via ORAL
  Filled 2020-06-25: qty 1

## 2020-06-25 MED ORDER — APIXABAN 5 MG PO TABS
5.0000 mg | ORAL_TABLET | Freq: Two times a day (BID) | ORAL | Status: DC
  Administered 2020-06-26 (×2): 5 mg via ORAL
  Filled 2020-06-25 (×2): qty 1

## 2020-06-25 MED ORDER — DILTIAZEM HCL 25 MG/5ML IV SOLN
10.0000 mg | Freq: Once | INTRAVENOUS | Status: AC
  Administered 2020-06-25: 10 mg via INTRAVENOUS
  Filled 2020-06-25: qty 5

## 2020-06-25 MED ORDER — LEVOTHYROXINE SODIUM 75 MCG PO TABS
75.0000 ug | ORAL_TABLET | Freq: Every day | ORAL | Status: DC
  Administered 2020-06-26: 75 ug via ORAL
  Filled 2020-06-25: qty 1

## 2020-06-25 MED ORDER — VENLAFAXINE HCL ER 75 MG PO CP24
150.0000 mg | ORAL_CAPSULE | Freq: Two times a day (BID) | ORAL | Status: DC
  Administered 2020-06-26 (×2): 150 mg via ORAL
  Filled 2020-06-25 (×2): qty 2
  Filled 2020-06-25: qty 1

## 2020-06-25 MED ORDER — ACETAMINOPHEN 650 MG RE SUPP: 650.0000 mg | Freq: Four times a day (QID) | RECTAL | Status: DC | PRN

## 2020-06-25 MED ORDER — SODIUM CHLORIDE 0.9 % IV BOLUS
500.0000 mL | Freq: Once | INTRAVENOUS | Status: AC
  Administered 2020-06-25: 500 mL via INTRAVENOUS

## 2020-06-25 MED ORDER — ASPIRIN 81 MG PO CHEW: 324.0000 mg | CHEWABLE_TABLET | Freq: Once | ORAL | Status: DC

## 2020-06-25 MED ORDER — SODIUM CHLORIDE 0.9% FLUSH
3.0000 mL | Freq: Two times a day (BID) | INTRAVENOUS | Status: DC
  Administered 2020-06-26 (×2): 3 mL via INTRAVENOUS

## 2020-06-25 MED ORDER — ACETAMINOPHEN 325 MG PO TABS: 650.0000 mg | ORAL_TABLET | Freq: Four times a day (QID) | ORAL | Status: DC | PRN

## 2020-06-25 MED ORDER — DILTIAZEM HCL ER COATED BEADS 180 MG PO CP24: 180.0000 mg | ORAL_CAPSULE | Freq: Every day | ORAL | Status: DC

## 2020-06-25 MED ORDER — METOPROLOL SUCCINATE ER 50 MG PO TB24: 50.0000 mg | ORAL_TABLET | Freq: Every day | ORAL | Status: DC

## 2020-06-25 MED ORDER — ISOSORBIDE MONONITRATE ER 60 MG PO TB24
120.0000 mg | ORAL_TABLET | Freq: Every day | ORAL | Status: DC
  Administered 2020-06-26: 120 mg via ORAL
  Filled 2020-06-25: qty 2

## 2020-06-25 MED ORDER — POTASSIUM CHLORIDE CRYS ER 20 MEQ PO TBCR
40.0000 meq | EXTENDED_RELEASE_TABLET | Freq: Once | ORAL | Status: AC
  Administered 2020-06-25: 40 meq via ORAL
  Filled 2020-06-25: qty 2

## 2020-06-25 NOTE — ED Notes (Signed)
Patient transported to X-ray 

## 2020-06-25 NOTE — ED Triage Notes (Signed)
Pt bib gems from home after a unwitnessed fall. Pt denies hitting head or LOC. Pt complaining of L hip pain. Gems reports shortening w/o deformity. Pt reports chest pain starting when family helped her up. History of a-fib. 100 mcg of fentanyl and 324 mg aspirin given PTA.

## 2020-06-25 NOTE — H&P (Signed)
History and Physical    [name] [name] OEV:[id number] DOB: 08/14/1934 DOA: 06/25/2020  PCP: [facility] [place]  Patient coming from: Home via EMS  I have personally briefly reviewed patient's old medical records in [place]  Chief Complaint: Fall at home  HPI: [name] [name] is a 86 y.o. female with medical history significant for paroxysmal atrial fibrillation on Eliquis, CAD s/p PCI, chronic diastolic CHF, hypertension, hypothyroidism, hyperlipidemia, and depression/anxiety who presents to the ED for evaluation after a fall at home.  Patient states she was in her usual state of health when she was in the bathroom area standing up and suddenly fell towards the floor.  She says she was realizing she was falling and tried to brace herself by grabbing the handle bar by the tub.  She says she did hit her right elbow on the ground but did not hit her head.  She says she really does not recall why she fell.  She does not remember if she was lightheaded or dizzy before this happened.  She says she does not think she lost consciousness or blacked out.  She says she did not have any chest pain, palpitations, dyspnea, nausea, vomiting, diaphoresis, loss of bowel/bladder control, dysuria, or new weakness in any of her legs.  She says she had been standing up for quite some time before she fell.  She has not seen any increased swelling in her legs.  She has not had any recent medication changes.  She says she normally ambulates with the use of a cane for short distances and uses a walker when she is out of the house.  She says she did not have her cane with her at the time of the fall.  ED Course:  Initial vitals showed BP 142/118, pulse 129, RR 23, temp 98.8 Fahrenheit, SPO2 93% on room air.  Labs show WBC 10.1, hemoglobin 15.4, platelets 268,000, sodium 135, potassium 3.3, bicarb 22, BUN 9, creatinine 0.91, serum glucose 119, LFTs within normal limits.  High-sensitivity troponin I  27.  SARS-CoV-2 PCR is collected and pending.  Urinalysis is obtained and pending.  2 view chest x-ray shows cardiomegaly without focal consolidation, edema, or effusion.  Right elbow x-ray is negative for evidence of fracture, dislocation, or joint effusion.  Right hip x-ray is negative for fracture or dislocation.  Patient was reported to be in atrial fibrillation with RVR initially on ED arrival.  She was given IV diltiazem 10 mg once with improved rate control.  She was given five-point cc normal saline and 40 mEq oral K.  The hospitalist service was consulted to admit for further evaluation and management of fall at home with potential syncopal event.  Review of Systems: All systems reviewed and are negative except as documented in history of present illness above.   Past Medical History:  Diagnosis Date  . Anginal pain ([place])   . Anxiety   . Arthritis    "little in my thumbs"back & knees   . Breast cancer ([place])    R breast, treated with mastectomy/tomoxifen in early 2000s  . Bronchitis    hx of  . CAD (coronary artery disease)    The patient had a left heart catheterization in August 2006 showed an EF of 70%, a 70% mid LAD lesion and 80% distal LAD ledsion, as well as 50% ostial first diagonal lesion.  The patient did have a drug-eluting stent placed in her mid LAD at that time.    Marland Kitchen  Chest pressure 03/13/2019  . Depression   . Diastolic heart failure    Echo 11/10 with EF 55-60%, moderate diastolic dysfunction, no regional WMAs, mild to moderate TR, moderate LAE, mild AI  . Frequent urination at night   . GERD (gastroesophageal reflux disease)    "gone since gallbladder took out"  . Heart murmur    "small"  . Hypercholesterolemia   . Hypertension   . Hypothyroidism   . Mental disorder   . Sleep disturbance    sleep study done- told that there wasn't any apnea concerns, states she is up & down for use of bathroom several times per night   . Type II diabetes mellitus  ([place])    "controlled w/diet and exercise"  . Urinary incontinence     Past Surgical History:  Procedure Laterality Date  . [place] VITRECTOMY WITH 20 GAUGE MVR PORT Right 06/04/2013   Procedure: 25 GAUGE PARS PLANA VITRECTOMY WITH 20 GAUGE MVR PORT / REMOVAL SILICONE OIL RIGHT EYE. With Endolaser;  Surgeon: Hayden Pedro, MD;  Location: [facility];  Service: Ophthalmology;  Laterality: Right;  . ABDOMINAL HYSTERECTOMY  1964  . Adenosine Myoview     9/09: EF 69% with normal perfusion images suggesting no ischemia or infarction.    . APPENDECTOMY  1954  . BACK SURGERY  06/2015   fusion-lumbar  . BREAST BIOPSY  2001   right  . CARDIAC CATHETERIZATION    . CARDIOVERSION N/A 03/15/2019   Procedure: CARDIOVERSION;  Surgeon: Fay Records, MD;  Location: [facility];  Service: Cardiovascular;  Laterality: N/A;  . CATARACT EXTRACTION W/ INTRAOCULAR LENS  IMPLANT, BILATERAL  ~ 2000  . CHOLECYSTECTOMY  ~ 2004  . COLONOSCOPY    . CORONARY ANGIOPLASTY    . CORONARY ANGIOPLASTY WITH STENT PLACEMENT  2000's   "1"  . DILATION AND CURETTAGE OF UTERUS  1960's   "I had 3"  . EYE SURGERY     /iol    following cataract removal  . GAS/FLUID EXCHANGE Right 06/04/2013   Procedure: GAS/FLUID EXCHANGE;  Surgeon: Hayden Pedro, MD;  Location: [facility];  Service: Ophthalmology;  Laterality: Right;  . LEFT HEART CATH AND CORONARY ANGIOGRAPHY N/A 03/14/2019   Procedure: LEFT HEART CATH AND CORONARY ANGIOGRAPHY;  Surgeon: Belva Crome, MD;  Location: [facility];  Service: Cardiovascular;  Laterality: N/A;  . MASTECTOMY  2001   right breast  . MEMBRANE PEEL Right 06/04/2013   Procedure: MEMBRANE PEEL;  Surgeon: Hayden Pedro, MD;  Location: [facility];  Service: Ophthalmology;  Laterality: Right;  . NEUROPLASTY / TRANSPOSITION MEDIAN NERVE AT CARPAL TUNNEL BILATERAL  1990's  . PARS PLANA VITRECTOMY  04/03/2012   right  . PARS PLANA VITRECTOMY  04/03/2012   Procedure: PARS PLANA VITRECTOMY WITH  25 GAUGE;  Surgeon: Hayden Pedro, MD;  Location: [facility];  Service: Ophthalmology;  Laterality: Right;  Silicone Oil, Perfluron, Repair Complex Traction Retinal Detachment  . PLANTAR FASCIA RELEASE  1990's   left  . TONSILLECTOMY AND ADENOIDECTOMY  1972  . TOTAL KNEE ARTHROPLASTY Left 10/13/2015   Procedure: TOTAL KNEE ARTHROPLASTY;  Surgeon: Melrose Nakayama, MD;  Location: [facility];  Service: Orthopedics;  Laterality: Left;  . TOTAL KNEE ARTHROPLASTY Right 09/20/2016   Procedure: TOTAL KNEE ARTHROPLASTY;  Surgeon: Melrose Nakayama, MD;  Location: [facility];  Service: Orthopedics;  Laterality: Right;  . ULNAR NERVE REPAIR     left arm    Social History:  reports that she has never smoked. She has never used smokeless tobacco. She reports that she does not drink alcohol and does not use drugs.  Allergies  Allergen Reactions  . No Known Allergies     Family History  Problem Relation Age of Onset  . Cancer Mother   . Alcoholism Father   . Cancer Brother      Prior to Admission medications   Medication Sig Start Date End Date Taking? Authorizing Provider  apixaban (ELIQUIS) 5 MG TABS tablet Take 1 tablet (5 mg total) by mouth 2 (two) times daily. 01/23/20   Croitoru, Mihai, MD  atorvastatin (LIPITOR) 40 MG tablet Take 1 tablet (40 mg total) by mouth daily. 02/14/20 05/14/20  Croitoru, Mihai, MD  diltiazem (CARDIZEM CD) 180 MG 24 hr capsule Take 1 capsule (180 mg total) by mouth daily. 01/23/20   Croitoru, Mihai, MD  furosemide (LASIX) 40 MG tablet Take 1 tablet (40 mg total) by mouth daily. 01/29/20 04/28/20  Croitoru, Mihai, MD  isosorbide mononitrate (IMDUR) 120 MG 24 hr tablet Take 120 mg by mouth Daily.  04/19/11   [provider]  levothyroxine (SYNTHROID) 75 MCG tablet Take 75 mcg by mouth daily. 09/29/19   [provider]  losartan (COZAAR) 100 MG tablet Take 100 mg by mouth daily. 08/26/19   [provider]  metoprolol succinate (TOPROL-XL) 50 MG 24 hr tablet Take 1  tablet (50 mg total) by mouth daily. Take with or immediately following a meal. 03/17/19   Hongalgi, Lenis Dickinson, MD  spironolactone (ALDACTONE) 25 MG tablet Take 1 tablet (25 mg total) by mouth daily. 10/11/19 01/09/20  Croitoru, Mihai, MD  traZODone (DESYREL) 150 MG tablet Take 225 mg by mouth at bedtime.     [provider]  venlafaxine XR (EFFEXOR-XR) 150 MG 24 hr capsule Take 150 mg by mouth 2 (two) times a day.     [provider]    Physical Exam: Vitals:   06/25/20 2130 06/25/20 2145 06/25/20 2200 06/25/20 2215  BP:  (!) 157/102 (!) 134/111 (!) 132/93  Pulse: 91 98 (!) 104 (!) 101  Resp: (!) 21 16 14 17   Temp:      SpO2: 96% 92% 98% 95%  Weight:      Height:       Constitutional: Resting in bed, NAD, calm, comfortable Eyes: PERRL, lids and conjunctivae normal ENMT: Mucous membranes are moist. Posterior pharynx clear of any exudate or lesions.Normal dentition.  Neck: normal, supple, no masses. Respiratory: clear to auscultation bilaterally, no wheezing, no crackles. Normal respiratory effort. No accessory muscle use.  Cardiovascular: Irregularly irregular and tachycardic, no murmurs / rubs / gallops. No extremity edema. 2+ pedal pulses. Abdomen: no tenderness, no masses palpated. No hepatosplenomegaly. Bowel sounds positive.  Musculoskeletal: no clubbing / cyanosis. No joint deformity upper and lower extremities. Good ROM, no contractures. Normal muscle tone.  Skin: no rashes, lesions, ulcers. No induration Neurologic: CN 2-12 grossly intact. Sensation intact, Strength 5/5 in all 4.  Psychiatric: Normal judgment and insight. Alert and oriented x 3. Normal mood.   Labs on Admission: I have personally reviewed following labs and imaging studies  CBC: Recent Labs  Lab 06/25/20 1945  WBC 10.1  NEUTROABS 8.5*  HGB 15.4*  HCT 45.0  MCV 89.8  PLT 950   Basic Metabolic Panel: Recent Labs  Lab 06/25/20 1945  NA 135  K 3.3*  CL 101  CO2 22  GLUCOSE 119*  BUN  9  CREATININE 0.91  CALCIUM 9.5   GFR: Estimated Creatinine Clearance: 47.9 mL/min (by C-G formula based on SCr of 0.91 mg/dL). Liver Function Tests: Recent Labs  Lab 06/25/20 1945  AST 26  ALT 23  ALKPHOS 60  BILITOT 1.0  PROT 7.1  ALBUMIN 3.9   No results for input(s): LIPASE, AMYLASE in the last 168 hours. No results for input(s): AMMONIA in the last 168 hours. Coagulation Profile: No results for input(s): INR, PROTIME in the last 168 hours. Cardiac Enzymes: No results for input(s): CKTOTAL, CKMB, CKMBINDEX, TROPONINI in the last 168 hours. BNP (last 3 results) No results for input(s): PROBNP in the last 8760 hours. HbA1C: No results for input(s): HGBA1C in the last 72 hours. CBG: Recent Labs  Lab 06/25/20 2054  GLUCAP 111*   Lipid Profile: No results for input(s): CHOL, HDL, LDLCALC, TRIG, CHOLHDL, LDLDIRECT in the last 72 hours. Thyroid Function Tests: No results for input(s): TSH, T4TOTAL, FREET4, T3FREE, THYROIDAB in the last 72 hours. Anemia Panel: No results for input(s): VITAMINB12, FOLATE, FERRITIN, TIBC, IRON, RETICCTPCT in the last 72 hours. Urine analysis:    Component Value Date/Time   COLORURINE YELLOW 03/12/2019 1843   APPEARANCEUR CLEAR 03/12/2019 1843   LABSPEC 1.017 03/12/2019 1843   PHURINE 5.0 03/12/2019 1843   GLUCOSEU NEGATIVE 03/12/2019 1843   HGBUR MODERATE (A) 03/12/2019 1843   BILIRUBINUR NEGATIVE 03/12/2019 [place] 03/12/2019 1843   PROTEINUR 30 (A) 03/12/2019 1843   UROBILINOGEN 1.0 06/25/2015 0932   NITRITE NEGATIVE 03/12/2019 1843   LEUKOCYTESUR SMALL (A) 03/12/2019 1843    Radiological Exams on Admission: DG Chest 2 View  Result Date: 06/25/2020 CLINICAL DATA:  Fall EXAM: CHEST - 2 VIEW COMPARISON:  Her 03/13/2019 FINDINGS: Cardiomegaly. Both lungs are clear. Disc degenerative disease of the thoracic spine. IMPRESSION: Cardiomegaly without acute abnormality of the lungs. Electronically Signed   By: Eddie Candle M.D.   On: 06/25/2020 20:29   DG Elbow Complete Right  Result Date: 06/25/2020 CLINICAL DATA:  Pain status post fall EXAM: RIGHT ELBOW - COMPLETE 3+ VIEW COMPARISON:  None. FINDINGS: There is no evidence of fracture, dislocation, or joint effusion. There is no evidence of arthropathy or other focal bone abnormality. Soft tissues are unremarkable. IMPRESSION: Negative. Electronically Signed   By: Constance Holster M.D.   On: 06/25/2020 20:29   DG Hip Unilat W or Wo Pelvis 2-3 Views Right  Result Date: 06/25/2020 CLINICAL DATA:  Hip pain. EXAM: DG HIP (WITH OR WITHOUT PELVIS) 2-3V RIGHT COMPARISON:  None. FINDINGS: Mild degenerative changes are noted of both hips. Phleboliths project over the patient's pelvis. There is no acute displaced fracture or dislocation. IMPRESSION: Negative. Electronically Signed   By: Constance Holster M.D.   On: 06/25/2020 20:31    EKG: Personally reviewed. Atrial fibrillation, rate 117, RBBB and LAFB.  Previous EKG April 2021 showed sinus rhythm with PACs with rate 75 bpm.  Assessment/Plan Principal Problem:   Fall at home, initial encounter Active Problems:   Hypothyroidism   Essential hypertension   Chronic diastolic HF (heart failure) (H[facility])   PAF (paroxysmal atrial fibrillation) ([facility])   CAD S/P PCI  [name] [name] is a 86 y.o. female with medical history significant for paroxysmal atrial fibrillation on Eliquis, CAD s/p PCI, chronic diastolic CHF, hypertension, hypothyroidism, hyperlipidemia, and depression/anxiety who is admitted for evaluation after a fall at home.  Fall at home: Unclear etiology, patient does not really recall what happened.  Possible syncopal event or mechanical/functional fall.  Right elbow and hip x-rays are negative for fracture or dislocation. -Monitor on telemetry -Check orthostatic vitals -PT/OT eval  Paroxysmal atrial fibrillation: In A. fib RVR on arrival to ED.  Rate improved after receiving IV diltiazem. -Resume  home metoprolol and diltiazem -Continue Eliquis  CAD s/p PCI: Troponin minimally elevated, likely secondary to A. fib with RVR.  Denies any current chest pain.  Continue Eliquis, atorvastatin, Toprol-XL, Imdur.  Chronic diastolic CHF: Stable, appears euvolemic on admission.  Hypothyroidism: Continue Synthroid, check TSH.  Hypertension: Currently stable.  Resume home metoprolol and diltiazem for A. fib rate control.  Hold losartan and spironolactone for now pending orthostatic vitals.  Hyperlipidemia: Continue atorvastatin.  Depression/anxiety: Continue Effexor.  DVT prophylaxis: Eliquis Code Status: Full code, confirmed with patient Family Communication: Discussed with patient, she has discussed with family Disposition Plan: From home and likely discharge to home pending PT/OT eval Consults called: None Admission status:  Status is: Observation  The patient remains OBS appropriate and will d/c before 2 midnights.  Dispo: The patient is from: Home              Anticipated d/c is to: Home              Anticipated d/c date is: 1 day              Patient currently is not medically stable to d/c.   Zada Finders MD [facility]  If 7PM-7AM, please contact night-coverage [email or web address]  06/25/2020, 10:35 PM

## 2020-06-25 NOTE — ED Provider Notes (Signed)
[facility] EMERGENCY DEPARTMENT Provider Note   CSN: [id number] Arrival date & time: 06/25/20  1920     History Chief Complaint  Patient presents with   Fall   Chest Pain    [name] [name] is a 86 y.o. female.  HPI 86 year old female presents with fall.  Patient is not sure how she fell but fell in the bathroom.  She did not hit her head or lose consciousness.  She did not feel syncopal or near syncopal.  She has been having chronic chest pain that she states she thinks she last felt yesterday or today.  No chest pain right now.  Does not feel short of breath now but often has chronic dyspnea on exertion.  She injured her right elbow as well as her right buttock/posterior hip. Has chronic afib.    Past Medical History:  Diagnosis Date   Anginal pain ([place])    Anxiety    Arthritis    "little in my thumbs"back & knees    Breast cancer ([place])    R breast, treated with mastectomy/tomoxifen in early 2000s   Bronchitis    hx of   CAD (coronary artery disease)    The patient had a left heart catheterization in August 2006 showed an EF of 70%, a 70% mid LAD lesion and 80% distal LAD ledsion, as well as 50% ostial first diagonal lesion.  The patient did have a drug-eluting stent placed in her mid LAD at that time.     Chest pressure 03/13/2019   Depression    Diastolic heart failure    Echo 11/10 with EF 55-60%, moderate diastolic dysfunction, no regional WMAs, mild to moderate TR, moderate LAE, mild AI   Frequent urination at night    GERD (gastroesophageal reflux disease)    "gone since gallbladder took out"   Heart murmur    "small"   Hypercholesterolemia    Hypertension    Hypothyroidism    Mental disorder    Sleep disturbance    sleep study done- told that there wasn't any apnea concerns, states she is up & down for use of bathroom several times per night    Type II diabetes mellitus ([place])    "controlled w/diet and exercise"   Urinary  incontinence     Patient Active Problem List   Diagnosis Date Noted   Fall at home, initial encounter 06/25/2020   Depression with anxiety 06/25/2020   DOE (dyspnea on exertion) 11/08/2019   Chronic anticoagulation 11/08/2019   Sleep apnea 11/08/2019   Fever 03/13/2019   Chest pressure    CAD S/P PCI    PAF (paroxysmal atrial fibrillation) ([place]) 12/20/2018   Primary localized osteoarthritis of right knee 09/20/2016   Primary osteoarthritis of right knee 09/20/2016   Primary osteoarthritis of left knee 10/13/2015   Radiculopathy 07/08/2015   Preretinal fibrosis, 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 With Endolaser;  Surgeon: Hayden Pedro, MD;  Location: [place];  Service: Ophthalmology;  Laterality: Right;   ABDOMINAL HYSTERECTOMY  1964  Adenosine Myoview     9/09: EF 69% with normal perfusion images suggesting no ischemia or infarction.     [place] SURGERY  06/2015   fusion-lumbar   BREAST BIOPSY  2001   right   CARDIAC CATHETERIZATION     CARDIOVERSION N/A 03/15/2019   Procedure: CARDIOVERSION;  Surgeon: Fay Records, MD;  Location: [facility] ENDOSCOPY;  Service: Cardiovascular;  Laterality: N/A;   CATARACT EXTRACTION W/ INTRAOCULAR LENS  IMPLANT, BILATERAL  ~ 2000   CHOLECYSTECTOMY  ~ 2004   COLONOSCOPY     CORONARY ANGIOPLASTY     CORONARY ANGIOPLASTY WITH STENT PLACEMENT  2000's   "1"   DILATION AND CURETTAGE OF UTERUS  1960's   "I had 3"   EYE SURGERY     /iol    following  cataract removal   GAS/FLUID EXCHANGE Right 06/04/2013   Procedure: GAS/FLUID EXCHANGE;  Surgeon: Hayden Pedro, MD;  Location: [place];  Service: Ophthalmology;  Laterality: Right;   LEFT HEART CATH AND CORONARY ANGIOGRAPHY N/A 03/14/2019   Procedure: LEFT HEART CATH AND CORONARY ANGIOGRAPHY;  Surgeon: Belva Crome, MD;  Location: [place] CV LAB;  Service: Cardiovascular;  Laterality: N/A;   MASTECTOMY  2001   right breast   MEMBRANE PEEL Right 06/04/2013   Procedure: MEMBRANE PEEL;  Surgeon: Hayden Pedro, MD;  Location: [place];  Service: Ophthalmology;  Laterality: Right;   NEUROPLASTY / TRANSPOSITION MEDIAN NERVE AT CARPAL TUNNEL BILATERAL  1990's   PARS PLANA VITRECTOMY  04/03/2012   right   PARS PLANA VITRECTOMY  04/03/2012   Procedure: PARS PLANA VITRECTOMY WITH 25 GAUGE;  Surgeon: Hayden Pedro, MD;  Location: [place];  Service: Ophthalmology;  Laterality: Right;  Silicone Oil, Perfluron, Repair Complex Traction Retinal Detachment   PLANTAR FASCIA RELEASE  1990's   left   TONSILLECTOMY AND ADENOIDECTOMY  1972   TOTAL KNEE ARTHROPLASTY Left 10/13/2015   Procedure: TOTAL KNEE ARTHROPLASTY;  Surgeon: Melrose Nakayama, MD;  Location: [place];  Service: Orthopedics;  Laterality: Left;   TOTAL KNEE ARTHROPLASTY Right 09/20/2016   Procedure: TOTAL KNEE ARTHROPLASTY;  Surgeon: Melrose Nakayama, MD;  Location: [place];  Service: Orthopedics;  Laterality: Right;   ULNAR NERVE REPAIR     left arm     OB History   No obstetric history on file.     Family History  Problem Relation Age of Onset   Cancer Mother    Alcoholism Father    Cancer Brother     Social History   Tobacco Use   Smoking status: Never Smoker   Smokeless tobacco: Never Used  Scientific laboratory technician Use: Never used  Substance Use Topics   Alcohol use: No   Drug use: No    Home Medications Prior to Admission medications   Medication Sig Start Date End Date Taking? Authorizing Provider  apixaban  (ELIQUIS) 5 MG TABS tablet Take 1 tablet (5 mg total) by mouth 2 (two) times daily. 01/23/20   Croitoru, Mihai, MD  atorvastatin (LIPITOR) 40 MG tablet Take 1 tablet (40 mg total) by mouth daily. 02/14/20 05/14/20  Croitoru, Mihai, MD  diltiazem (CARDIZEM CD) 180 MG 24 hr capsule Take 1 capsule (180 mg total) by mouth daily. 01/23/20   Croitoru, Mihai, MD  furosemide (LASIX) 40 MG tablet Take 1 tablet (40 mg total) by mouth daily. 01/29/20 04/28/20  Croitoru, Mihai, MD  isosorbide mononitrate (IMDUR) 120 MG 24 hr tablet Take  120 mg by mouth Daily.  04/19/11   [provider]  levothyroxine (SYNTHROID) 75 MCG tablet Take 75 mcg by mouth daily. 09/29/19   [provider]  losartan (COZAAR) 100 MG tablet Take 100 mg by mouth daily. 08/26/19   [provider]  metoprolol succinate (TOPROL-XL) 50 MG 24 hr tablet Take 1 tablet (50 mg total) by mouth daily. Take with or immediately following a meal. 03/17/19   Hongalgi, Lenis Dickinson, MD  spironolactone (ALDACTONE) 25 MG tablet Take 1 tablet (25 mg total) by mouth daily. 10/11/19 01/09/20  Croitoru, Mihai, MD  traZODone (DESYREL) 150 MG tablet Take 225 mg by mouth at bedtime.     [provider]  venlafaxine XR (EFFEXOR-XR) 150 MG 24 hr capsule Take 150 mg by mouth 2 (two) times a day.     [provider]    Allergies    No known allergies  Review of Systems   Review of Systems  Respiratory: Positive for shortness of breath.   Cardiovascular: Positive for chest pain.  Gastrointestinal: Negative for abdominal pain.  Musculoskeletal: Positive for arthralgias.  Neurological: Negative for dizziness, syncope, light-headedness and headaches.  All other systems reviewed and are negative.   Physical Exam Updated Vital Signs BP (!) 132/93    Pulse (!) 101    Temp 98.8 F (37.1 C)    Resp 17    Ht 5\' 3"  (1.6 m)    Wt 86.2 kg    SpO2 95%    BMI 33.66 kg/m   Physical Exam Vitals and nursing note reviewed.  Constitutional:       General: She is not in acute distress.    Appearance: She is well-developed. She is not ill-appearing or diaphoretic.  HENT:     Head: Normocephalic and atraumatic.     Right Ear: External ear normal.     Left Ear: External ear normal.     Nose: Nose normal.  Eyes:     General:        Right eye: No discharge.        Left eye: No discharge.     Extraocular Movements: Extraocular movements intact.     Pupils: Pupils are equal, round, and reactive to light.  Cardiovascular:     Rate and Rhythm: Tachycardia present. Rhythm irregular.     Pulses:          Radial pulses are 2+ on the right side.     Heart sounds: Normal heart sounds.  Pulmonary:     Effort: Pulmonary effort is normal.     Breath sounds: Normal breath sounds.  Abdominal:     Palpations: Abdomen is soft.     Tenderness: There is no abdominal tenderness.  Musculoskeletal:     Right elbow: Normal range of motion. No tenderness.     Right hip: Tenderness (mild, posterior) present. No deformity or bony tenderness. Normal range of motion.     Comments: Mild redness on elbow from fall  Skin:    General: Skin is warm and dry.  Neurological:     Mental Status: She is alert and oriented to person, place, and time.     Comments: CN 3-12 grossly intact. 5/5 strength in all 4 extremities.   Psychiatric:        Mood and Affect: Mood is not anxious.     ED Results / Procedures / Treatments   Labs (all labs ordered are listed, but only abnormal results are displayed) Labs Reviewed  CBC WITH DIFFERENTIAL/PLATELET - Abnormal; Notable for the following components:      Result Value   Hemoglobin 15.4 (*)    Neutro Abs 8.5 (*)    All other components within normal limits  COMPREHENSIVE METABOLIC PANEL - Abnormal; Notable for the following components:   Potassium 3.3 (*)    Glucose, Bld 119 (*)    All other components within normal limits  URINALYSIS, ROUTINE W REFLEX MICROSCOPIC - Abnormal; Notable for the following  components:   Color, Urine STRAW (*)    Specific Gravity, Urine 1.003 (*)    Hgb urine dipstick MODERATE (*)    Leukocytes,Ua SMALL (*)    All other components within normal limits  CBG MONITORING, ED - Abnormal; Notable for the following components:   Glucose-Capillary 111 (*)    All other components within normal limits  TROPONIN I (HIGH SENSITIVITY) - Abnormal; Notable for the following components:   Troponin I (High Sensitivity) 27 (*)    All other components within normal limits  TROPONIN I (HIGH SENSITIVITY) - Abnormal; Notable for the following components:   Troponin I (High Sensitivity) 32 (*)    All other components within normal limits  RESPIRATORY PANEL BY RT PCR (FLU A&B, COVID)  MAGNESIUM  TSH  BASIC METABOLIC PANEL  CBC    EKG EKG Interpretation  Date/Time:  Thursday June 25 2020 19:42:18 EST Ventricular Rate:  117 PR Interval:    QRS Duration: 134 QT Interval:  367 QTC Calculation: 512 R Axis:   -88 Text Interpretation: Atrial fibrillation RBBB and LAFB Confirmed by Sherwood Gambler [phone]) on 06/25/2020 7:43:22 PM   Radiology DG Chest 2 View  Result Date: 06/25/2020 CLINICAL DATA:  Fall EXAM: CHEST - 2 VIEW COMPARISON:  Her 03/13/2019 FINDINGS: Cardiomegaly. Both lungs are clear. Disc degenerative disease of the thoracic spine. IMPRESSION: Cardiomegaly without acute abnormality of the lungs. Electronically Signed   By: Eddie Candle M.D.   On: 06/25/2020 20:29   DG Elbow Complete Right  Result Date: 06/25/2020 CLINICAL DATA:  Pain status post fall EXAM: RIGHT ELBOW - COMPLETE 3+ VIEW COMPARISON:  None. FINDINGS: There is no evidence of fracture, dislocation, or joint effusion. There is no evidence of arthropathy or other focal bone abnormality. Soft tissues are unremarkable. IMPRESSION: Negative. Electronically Signed   By: Constance Holster M.D.   On: 06/25/2020 20:29   DG Hip Unilat W or Wo Pelvis 2-3 Views Right  Result Date: 06/25/2020 CLINICAL  DATA:  Hip pain. EXAM: DG HIP (WITH OR WITHOUT PELVIS) 2-3V RIGHT COMPARISON:  None. FINDINGS: Mild degenerative changes are noted of both hips. Phleboliths project over the patient's pelvis. There is no acute displaced fracture or dislocation. IMPRESSION: Negative. Electronically Signed   By: Constance Holster M.D.   On: 06/25/2020 20:31    Procedures Procedures (including critical care time)  Medications Ordered in ED Medications  sodium chloride flush (NS) 0.9 % injection 3 mL (has no administration in time range)  acetaminophen (TYLENOL) tablet 650 mg (has no administration in time range)    Or  acetaminophen (TYLENOL) suppository 650 mg (has no administration in time range)  sodium chloride 0.9 % bolus 500 mL (0 mLs Intravenous Stopped 06/25/20 2143)  diltiazem (CARDIZEM) injection 10 mg (10 mg Intravenous Given 06/25/20 2055)  potassium chloride SA (KLOR-CON) CR tablet 40 mEq (40 mEq Oral Given 06/25/20 2143)    ED Course  I have reviewed the triage vital signs and the nursing notes.  Pertinent labs &  imaging results that were available during my care of the patient were reviewed by me and considered in my medical decision making (see chart for details).    MDM Rules/Calculators/A&P                          Patient presents after a fall.  She is not sure how she fell.  I question whether she had syncope.  No headache or head injury.  She is in A. fib with mild rapid rate but this improved with a dose of diltiazem.  She is able to ambulate, indicating she likely does not have an occult hip fracture.  At this point, with low level troponin elevation and on and off chest pain as well as questionable syncope I think she should be admitted for observation.  Dr. Posey Pronto to admit. Final Clinical Impression(s) / ED Diagnoses Final diagnoses:  Fall, initial encounter    Rx / DC Orders ED Discharge Orders    None       Sherwood Gambler, MD 06/25/20 2322

## 2020-06-25 NOTE — ED Notes (Signed)
Pt ambulated with walker with no assistance.

## 2020-06-26 DIAGNOSIS — I110 Hypertensive heart disease with heart failure: Secondary | ICD-10-CM | POA: Diagnosis not present

## 2020-06-26 DIAGNOSIS — I5032 Chronic diastolic (congestive) heart failure: Secondary | ICD-10-CM | POA: Diagnosis not present

## 2020-06-26 DIAGNOSIS — U071 COVID-19: Secondary | ICD-10-CM | POA: Diagnosis not present

## 2020-06-26 DIAGNOSIS — Z79899 Other long term (current) drug therapy: Secondary | ICD-10-CM | POA: Diagnosis not present

## 2020-06-26 DIAGNOSIS — Y92009 Unspecified place in unspecified non-institutional (private) residence as the place of occurrence of the external cause: Secondary | ICD-10-CM

## 2020-06-26 DIAGNOSIS — Z96653 Presence of artificial knee joint, bilateral: Secondary | ICD-10-CM | POA: Diagnosis not present

## 2020-06-26 DIAGNOSIS — R079 Chest pain, unspecified: Secondary | ICD-10-CM | POA: Diagnosis present

## 2020-06-26 DIAGNOSIS — E039 Hypothyroidism, unspecified: Secondary | ICD-10-CM | POA: Diagnosis not present

## 2020-06-26 DIAGNOSIS — W19XXXA Unspecified fall, initial encounter: Secondary | ICD-10-CM

## 2020-06-26 DIAGNOSIS — Z853 Personal history of malignant neoplasm of breast: Secondary | ICD-10-CM | POA: Diagnosis not present

## 2020-06-26 DIAGNOSIS — Z7901 Long term (current) use of anticoagulants: Secondary | ICD-10-CM | POA: Diagnosis not present

## 2020-06-26 DIAGNOSIS — Z9861 Coronary angioplasty status: Secondary | ICD-10-CM

## 2020-06-26 DIAGNOSIS — I10 Essential (primary) hypertension: Secondary | ICD-10-CM | POA: Diagnosis not present

## 2020-06-26 DIAGNOSIS — Z23 Encounter for immunization: Secondary | ICD-10-CM | POA: Diagnosis not present

## 2020-06-26 DIAGNOSIS — I2510 Atherosclerotic heart disease of native coronary artery without angina pectoris: Secondary | ICD-10-CM | POA: Diagnosis not present

## 2020-06-26 DIAGNOSIS — E119 Type 2 diabetes mellitus without complications: Secondary | ICD-10-CM | POA: Diagnosis not present

## 2020-06-26 LAB — RESPIRATORY PANEL BY RT PCR (FLU A&B, COVID)
Influenza A by PCR: NEGATIVE
Influenza B by PCR: NEGATIVE
SARS Coronavirus 2 by RT PCR: POSITIVE — AB

## 2020-06-26 LAB — CBC
HCT: 44.8 % (ref 36.0–46.0)
Hemoglobin: 15.1 g/dL — ABNORMAL HIGH (ref 12.0–15.0)
MCH: 30.3 pg (ref 26.0–34.0)
MCHC: 33.7 g/dL (ref 30.0–36.0)
MCV: 90 fL (ref 80.0–100.0)
Platelets: 255 10*3/uL (ref 150–400)
RBC: 4.98 MIL/uL (ref 3.87–5.11)
RDW: 13.1 % (ref 11.5–15.5)
WBC: 8.5 10*3/uL (ref 4.0–10.5)
nRBC: 0 % (ref 0.0–0.2)

## 2020-06-26 LAB — BASIC METABOLIC PANEL
Anion gap: 11 (ref 5–15)
BUN: 8 mg/dL (ref 8–23)
CO2: 23 mmol/L (ref 22–32)
Calcium: 9.3 mg/dL (ref 8.9–10.3)
Chloride: 103 mmol/L (ref 98–111)
Creatinine, Ser: 0.8 mg/dL (ref 0.44–1.00)
GFR, Estimated: >60 mL/min (ref >60)
Glucose, Bld: 117 mg/dL — ABNORMAL HIGH (ref 70–99)
Potassium: 3.3 mmol/L — ABNORMAL LOW (ref 3.5–5.1)
Sodium: 137 mmol/L (ref 135–145)

## 2020-06-26 LAB — TSH: TSH: 2.997 u[IU]/mL (ref 0.350–4.500)

## 2020-06-26 LAB — TROPONIN I (HIGH SENSITIVITY): Troponin I (High Sensitivity): 25 ng/L — ABNORMAL HIGH (ref <18)

## 2020-06-26 MED ORDER — METHYLPREDNISOLONE SODIUM SUCC 125 MG IJ SOLR: 125.0000 mg | Freq: Once | INTRAMUSCULAR | Status: DC | PRN

## 2020-06-26 MED ORDER — SODIUM CHLORIDE 0.9 % IV SOLN
Freq: Once | INTRAVENOUS | Status: AC
  Filled 2020-06-26: qty 20

## 2020-06-26 MED ORDER — EPINEPHRINE 0.3 MG/0.3ML IJ SOAJ
0.3000 mg | Freq: Once | INTRAMUSCULAR | Status: DC | PRN
  Filled 2020-06-26 (×2): qty 0.6

## 2020-06-26 MED ORDER — ALBUTEROL SULFATE HFA 108 (90 BASE) MCG/ACT IN AERS
2.0000 | INHALATION_SPRAY | Freq: Once | RESPIRATORY_TRACT | Status: DC | PRN
  Filled 2020-06-26: qty 6.7

## 2020-06-26 MED ORDER — METOPROLOL SUCCINATE ER 50 MG PO TB24
50.0000 mg | ORAL_TABLET | Freq: Every day | ORAL | Status: DC
  Administered 2020-06-26: 50 mg via ORAL
  Filled 2020-06-26 (×2): qty 1

## 2020-06-26 MED ORDER — SODIUM CHLORIDE 0.9 % IV SOLN: INTRAVENOUS | Status: DC | PRN

## 2020-06-26 MED ORDER — DILTIAZEM HCL ER COATED BEADS 180 MG PO CP24
180.0000 mg | ORAL_CAPSULE | Freq: Every day | ORAL | Status: DC
  Administered 2020-06-26: 180 mg via ORAL
  Filled 2020-06-26 (×2): qty 1

## 2020-06-26 MED ORDER — FAMOTIDINE IN NACL 20-0.9 MG/50ML-% IV SOLN: 20.0000 mg | Freq: Once | INTRAVENOUS | Status: DC | PRN

## 2020-06-26 MED ORDER — DIPHENHYDRAMINE HCL 50 MG/ML IJ SOLN: 50.0000 mg | Freq: Once | INTRAMUSCULAR | Status: DC | PRN

## 2020-06-26 NOTE — Plan of Care (Signed)

## 2020-06-26 NOTE — Progress Notes (Addendum)
Informed by RN that patient slightly confused-evaluated patient-she was answering all my questions appropriately-however at times-she needed simple redirection.  Spoke with daughter-family suspects some amount of dementia-patient's clinical features are highly suspicious of mild delirium/sundowning in the setting of dementia.  Family comfortable in taking patient back-suspect she will be better in her familiar surroundings.

## 2020-06-26 NOTE — Progress Notes (Signed)
Pharmacy COVID-19 Monoclonal Antibody Screening  [name] [name] was identified as being not hospitalized with symptoms from Covid-19 on admission but an incidental positive PCR has been documented.  The patient qualifies for the use of monoclonal antibodies (mAB) for COVID-19 viral infection to prevent worsening symptoms stemming from Covid-19 infection.  The patient was identified based on a positive COVID-19 PCR and not requiring the use of supplemental oxygen at this time.  This patient meets the FDA criteria for Emergency Use Authorization of casirivimab/imdevimab or bamlanivimab/etesevimab.  Has a (+) direct SARS-CoV-2 viral test result  Is NOT hospitalized due to COVID-19  Is within 10 days of symptom onset  Has at least one of the high risk factor(s) for progression to severe COVID-19 and/or hospitalization as defined in EUA.  Specific high risk criteria : Older age (>/= 84 yo) and Cardiovascular disease or hypertension  Additionally: The patient has not had a positive COVID-19 PCR in the last 90 days.  The patient is fully vaccinated against COVID-19.  Since the patient is partially or fully vaccinated for COVID-19, is asymptomatic with a cycle time of < 32, and meets high risk criteria, the patient is eligible for mAB administration.   This eligibility and indication for treatment was discussed with the patient's physician: Dr Sloan Leiter  Plan: Based on the above discussion, it was decided that the patient will receive one dose of the available COVID-19 mAB combination. Pharmacy will coordinate administration timing with patient's nurse. Recommended infusion monitoring parameters communicated to the nursing team.   Bonnita Nasuti Pharm.D. CPP, BCPS Clinical Pharmacist [phone] 06/26/2020 8:40 AM

## 2020-06-26 NOTE — Progress Notes (Signed)
Pt scoring a yellow mews, pt was admitted  With afib   06/26/20 0022  Assess: MEWS Score  Temp 99.1 F (37.3 C)  BP (!) 156/95  Pulse Rate (!) 103  Resp (!) 22  SpO2 98 %  O2 Device Room Air  Assess: MEWS Score  MEWS Temp 0  MEWS Systolic 0  MEWS Pulse 1  MEWS RR 1  MEWS LOC 0  MEWS Score 2  MEWS Score Color Yellow  Assess: if the MEWS score is Yellow or Red  Were vital signs taken at a resting state? Yes  Focused Assessment No change from prior assessment  Early Detection of Sepsis Score *See Row Information* Low  MEWS guidelines implemented *See Row Information* Yes  Treat  MEWS Interventions Administered scheduled meds/treatments  Pain Scale 0-10  Pain Score 0  Take Vital Signs  Increase Vital Sign Frequency  Yellow: Q 2hr X 2 then Q 4hr X 2, if remains yellow, continue Q 4hrs (new admit, already treated in ED, will cont q4 vitals)  Escalate  MEWS: Escalate Yellow: discuss with charge nurse/RN and consider discussing with provider and RRT  Notify: Charge Nurse/RN  Name of Charge Nurse/RN Notified Jaquetta RN  Date Charge Nurse/RN Notified 06/26/20  Time Charge Nurse/RN Notified 0125

## 2020-06-26 NOTE — TOC Transition Note (Signed)
Transition of Care [facility]) - CM/SW Discharge Note   Patient Details  Name: [name] [name] MRN: [id number] Date of Birth: 05-20-1934  Transition of Care [facility]) CM/SW Contact:  Zenon Mayo, RN Phone Number: 06/26/2020, 10:01 AM   Clinical Narrative:    NCM spoke with patient over the phone, offered choice, she states she does not have a preference.  NCM made referral to [facility] with [facility] for [place], he states he can take referral.  Soc will begin 24 to 48 hrs post dc.    Final next level of care: [place] Barriers to Discharge: No Barriers Identified   Patient Goals and CMS Choice Patient states their goals for this hospitalization and ongoing recovery are:: get better CMS Medicare.gov Compare Post Acute Care list provided to:: Patient Choice offered to / list presented to : Patient  Discharge Placement                       Discharge Plan and Services                  DME Agency: NA       HH Arranged: PT, OT HH Agency: [place] Date [place]: 06/26/20 Time HH Agency Contacted: 1001 Representative spoke with at [place]: [place] ([place]) Interventions     Readmission Risk Interventions No flowsheet data found.

## 2020-06-26 NOTE — Progress Notes (Signed)
Admission: Pt arrived to floor via stretcher, pt has an elevated HR but vitals are stable on room air. Pt is able to self transfer from the stretcher to the bed and is alert and oriented x4. No pt belongings sent to the safe and no medications sent to pharmacy.    0330: Pt is somewhat disoriented but can be reoriented. Pt unable to tolerte trendelenburg to shift pt in bed. One episode of emesis. Pt bp elevated at this time. MD paged

## 2020-06-26 NOTE — Evaluation (Signed)
Occupational Therapy Evaluation Patient Details Name: [name] [name] MRN: [id number] DOB: May 21, 1934 Today's Date: 06/26/2020    History of Present Illness Pt adm after fall at home. PMH - afib, cad, chf, htn, depression/anxiety, DM, back fusion, bil TKR.    Clinical Impression   Patient admitted with above diagnosis.  She presents with 7/10 pain to L elbow after recent fall, but is using it without problems.  Patient states she does not know what happened, she does not have a history of falls.  She is agreeable to [facility] PT as recommended by MD.  She believes a 2WRW would be beneficial.  She uses a SPC currently at home.  She lives with her daughter, and is by herself during the day.  She needs no assist with bathing and dressing.  She completes light meal prep and light home management.  She care for her own meds, but does not drive.  Currently she is close to, it not at her baseline for functional tasks.  She likes the RW, and believes it would be best to transition to it at home.  No further OT needs in the acute setting.  No HH OT recommended.      Follow Up Recommendations  No OT follow up;Supervision - Intermittent    Equipment Recommendations  Other (comment) (Patient requesting 2WRW and HH PT) Shower Chair/ 3n1 BSC.   Recommendations for Other Services       Precautions / Restrictions Precautions Precautions: Fall Restrictions Weight Bearing Restrictions: No      Mobility Bed Mobility Overal bed mobility: Modified Independent             General bed mobility comments: Use of SR's    Transfers Overall transfer level: Modified independent Equipment used: Rolling walker (2 wheeled)             General transfer comment: patient up and walkiing in room without issue.  Mod I with toilet transfer.    Balance Overall balance assessment: No apparent balance deficits (not formally assessed)                                         ADL either performed  or assessed with clinical judgement   ADL Overall ADL's : At baseline                                       General ADL Comments: no safety issues, good balance.  no OT needs identified.  patient is requesting a RW.  Able to complete LB dressing without assist seated.  Stand grooming task with no assist.  Ind with hygiene after BR use.     Vision Baseline Vision/History: Wears glasses Wears Glasses: At all times Patient Visual Report: No change from baseline                  Pertinent Vitals/Pain Pain Assessment: 0-10 Pain Score: 7  Pain Location: R elbow Pain Descriptors / Indicators: Aching Pain Intervention(s): Monitored during session     Hand Dominance Right   Extremity/Trunk Assessment Upper Extremity Assessment Upper Extremity Assessment: Overall WFL for tasks assessed   Lower Extremity Assessment Lower Extremity Assessment: Defer to PT evaluation       Communication Communication Communication: No difficulties   Cognition Arousal/Alertness: Awake/alert Behavior During Therapy:  WFL for tasks assessed/performed Overall Cognitive Status: Within Functional Limits for tasks assessed                                     General Comments   VSS    Exercises     Shoulder Instructions      Home Living Family/patient expects to be discharged to:: Private residence Living Arrangements: Children Available Help at Discharge: Available PRN/intermittently Type of Home: House Home Access: Stairs to enter;Other (comment) (ramp in the back) Entrance Stairs-Number of Steps: 7 -- ramped entrance in back.   Entrance Stairs-Rails: Can reach both Home Layout: One level     Bathroom Shower/Tub: Occupational psychologist: Standard Bathroom Accessibility: Yes How Accessible: Accessible via walker Home Equipment: Grab bars - tub/shower;Cane - single point          Prior Functioning/Environment Level of Independence:  Independent with assistive device(s)        Comments: Uses cane in the house and walker when out        OT Problem List: Pain      OT Treatment/Interventions:      OT Goals(Current goals can be found in the care plan section) Acute Rehab OT Goals Patient Stated Goal: I'm ready to get back home OT Goal Formulation: With patient Time For Goal Achievement: 06/29/20 Potential to Achieve Goals: Good  OT Frequency:     Barriers to D/C:  None noted          Co-evaluation              AM-PAC OT "6 Clicks" Daily Activity     Outcome Measure Help from another person eating meals?: None Help from another person taking care of personal grooming?: None Help from another person toileting, which includes using toliet, bedpan, or urinal?: None Help from another person bathing (including washing, rinsing, drying)?: None Help from another person to put on and taking off regular upper body clothing?: None Help from another person to put on and taking off regular lower body clothing?: None 6 Click Score: 24   End of Session Equipment Utilized During Treatment: Rolling walker Nurse Communication: Mobility status  Activity Tolerance: Patient tolerated treatment well Patient left: in bed;with call bell/phone within reach;with nursing/sitter in room  OT Visit Diagnosis: Pain Pain - Right/Left: Right Pain - part of body: Arm                Time: 5852-7782 OT Time Calculation (min): 25 min Charges:  OT General Charges $OT Visit: 1 Visit OT Evaluation $OT Eval Moderate Complexity: 1 Mod  06/26/2020  Rich, OTR/L  Acute Rehabilitation Services  Office:  [phone]   Metta Clines 06/26/2020, 11:14 AM

## 2020-06-26 NOTE — Progress Notes (Signed)
Discharge instructions provided.  IV and telemetry monitor dc'd.  Spoke with daughter, [name] and reviewed dc instruction.  No questions or concerns noted at this time.  Dressed and taken to daughter for discharge.

## 2020-06-26 NOTE — Discharge Summary (Signed)
PATIENT DETAILS Name: [name] [name] Age: 86 y.o. Sex: female Date of Birth: 11/18/1933 MRN: [id number]. Admitting Physician: Lenore Cordia, MD SHF:[id number], [place] Date: 06/25/2020 Discharge date: 06/26/2020  Recommendations for Outpatient Follow-up:  1. Follow up with PCP in 1-2 weeks 2. Please obtain CMP/CBC in one week 3. If patient was to have more frequent falls in the future-May need to do a risk/benefi for continuing Eliquis. 4. Continue counseling regarding compliance to medications.  Admitted From:  Home  Disposition: Home with home health services   Index:  Yes  Equipment/Devices: None  Discharge Condition: Stable  CODE STATUS: FULL CODE  Diet recommendation:  Diet Order            Diet - low sodium heart healthy           Diet Heart Room service appropriate? Yes; Fluid consistency: Thin  Diet effective now                  Brief Summary: See H&P, Labs, Consult and Test reports for all details in brief, patient is a 86 year old female with history of PAF on anticoagulation with Eliquis, CAD s/p PCI in, chronic diastolic heart failure, HTN, hypothyroidism-who sustained a mechanical fall at home-and was brought to the ED for evaluation, while in the ED-she had a brief episode of A. fib with RVR and was subsequently admitted to the hospitalist service.  She was also incidentally found to have COVID-19 infection.  She is fully vaccinated.  See below for further details.  Brief Hospital Course: Mechanical fall: Claims she just lost her balance-never syncopized-never lost consciousness.  Per patient-she hit her right elbow when she fell-x-rays were negative for any fractures.  Discussed with patient-then with daughter over the phone-she has had only 1 fall this year-both agreed to continue anticoagulation for now-if she was to sustain more frequent falls in the future-we can then contemplate about possibly discontinuing anticoagulation at some  point.  Plan is to discharge home with home health services.    A. fib with RVR: Had a brief episode of A. fib with RVR while in the emergency room-has converted back to sinus rhythm-continue Eliquis and her usual medications.  COVID-19 infection: Apart from mild cough (ongoing for at least 5-7 days per patient)-really no other signs or symptoms-x-ray without pneumonia.  Not hypoxic.  COVID-19 appears to be more of a incidental finding.  She is fully vaccinated her last dose of COVID-19 vaccine was sometime in May.  Discussed about the use of monoclonal antibody-side effect/benefits/rationale discussed with patient-she has consented-she will get monoclonal antibody infusion prior to discharge.  Patient and her daughter aware that if she were to develop shortness of breath-or if her COVID-19 symptoms would worsen-she needs to seek immediate medical attention.  Daughter will provide 24/7 supervision and care.  Chronic diastolic heart failure: Euvolemic on exam.  Per daughter-noncompliant with diuretics.  CAD s/p PCI-no anginal symptoms-never syncopized-continue statin/beta-blocker (however noncompliant per daughter).  Suspect not on antiplatelet agents as patient on anticoagulation.  Hypothyroidism: Continue Synthroid-family-she has been noncompliant and irregularly takes levothyroxine.  HTN: Resume antihypertensives-she has been relatively noncompliant with her medications per family.  Medication noncompliance: Counseled-per daughter-the only medication she regularly takes is Eliquis.  COVID-19 Labs:  No results for input(s): DDIMER, FERRITIN, LDH, CRP in the last 72 hours.  Lab Results  Component Value Date   [place] (A) 06/25/2020   [place] NEGATIVE 03/12/2019     Obesity: Estimated  body mass index is 34.05 kg/m as calculated from the following:   Height as of this encounter: 5\' 3"  (1.6 m).   Weight as of this encounter: 87.2 kg.    Discharge Diagnoses:  Principal  Problem:   Fall at home, initial encounter Active Problems:   Hypothyroidism   Essential hypertension   Chronic diastolic HF (heart failure) (HCC)   PAF (paroxysmal atrial fibrillation) (HCC)   CAD S/P PCI   Depression with anxiety   Discharge Instructions:    Person Under Monitoring Name: ENRIQUE WEISS  Location: [place] [address]   Infection Prevention Recommendations for Individuals Confirmed to have, or Being Evaluated for, 2019 Novel Coronavirus (COVID-19) Infection Who Receive Care at Home  Individuals who are confirmed to have, or are being evaluated for, COVID-19 should follow the prevention steps below until a healthcare provider or local or state health department says they can return to normal activities.  Stay home except to get medical care You should restrict activities outside your home, except for getting medical care. Do not go to work, school, or public areas, and do not use public transportation or taxis.  Call ahead before visiting your doctor Before your medical appointment, call the healthcare provider and tell them that you have, or are being evaluated for, COVID-19 infection. This will help the healthcare provider's office take steps to keep other people from getting infected. Ask your healthcare provider to call the local or state health department.  Monitor your symptoms Seek prompt medical attention if your illness is worsening (e.g., difficulty breathing). Before going to your medical appointment, call the healthcare provider and tell them that you have, or are being evaluated for, COVID-19 infection. Ask your healthcare provider to call the local or state health department.  Wear a facemask You should wear a facemask that covers your nose and mouth when you are in the same room with other people and when you visit a healthcare provider. People who live with or visit you should also wear a facemask while they are in  the same room with you.  Separate yourself from other people in your home As much as possible, you should stay in a different room from other people in your home. Also, you should use a separate bathroom, if available.  Avoid sharing household items You should not share dishes, drinking glasses, cups, eating utensils, towels, bedding, or other items with other people in your home. After using these items, you should wash them thoroughly with soap and water.  Cover your coughs and sneezes Cover your mouth and nose with a tissue when you cough or sneeze, or you can cough or sneeze into your sleeve. Throw used tissues in a lined trash can, and immediately wash your hands with soap and water for at least 20 seconds or use an alcohol-based hand rub.  Wash your [facility] your hands often and thoroughly with soap and water for at least 20 seconds. You can use an alcohol-based hand sanitizer if soap and water are not available and if your hands are not visibly dirty. Avoid touching your eyes, nose, and mouth with unwashed hands.   Prevention Steps for Caregivers and Household Members of Individuals Confirmed to have, or Being Evaluated for, COVID-19 Infection Being Cared for in the Home  If you live with, or provide care at home for, a person confirmed to have, or being evaluated for, COVID-19 infection please follow these guidelines to prevent infection:  Follow  healthcare provider's instructions Make sure that you understand and can help the patient follow any healthcare provider instructions for all care.  Provide for the patient's basic needs You should help the patient with basic needs in the home and provide support for getting groceries, prescriptions, and other personal needs.  Monitor the patient's symptoms If they are getting sicker, call his or her medical provider and tell them that the patient has, or is being evaluated for, COVID-19 infection. This will help the healthcare  provider's office take steps to keep other people from getting infected. Ask the healthcare provider to call the local or state health department.  Limit the number of people who have contact with the patient  If possible, have only one caregiver for the patient.  Other household members should stay in another home or place of residence. If this is not possible, they should stay  in another room, or be separated from the patient as much as possible. Use a separate bathroom, if available.  Restrict visitors who do not have an essential need to be in the home.  Keep older adults, very young children, and other sick people away from the patient Keep older adults, very young children, and those who have compromised immune systems or chronic health conditions away from the patient. This includes people with chronic heart, lung, or kidney conditions, diabetes, and cancer.  Ensure good ventilation Make sure that shared spaces in the home have good air flow, such as from an air conditioner or an opened window, weather permitting.  Wash your hands often  Wash your hands often and thoroughly with soap and water for at least 20 seconds. You can use an alcohol based hand sanitizer if soap and water are not available and if your hands are not visibly dirty.  Avoid touching your eyes, nose, and mouth with unwashed hands.  Use disposable paper towels to dry your hands. If not available, use dedicated cloth towels and replace them when they become wet.  Wear a facemask and gloves  Wear a disposable facemask at all times in the room and gloves when you touch or have contact with the patient's blood, body fluids, and/or secretions or excretions, such as sweat, saliva, sputum, nasal mucus, vomit, urine, or feces.  Ensure the mask fits over your nose and mouth tightly, and do not touch it during use.  Throw out disposable facemasks and gloves after using them. Do not reuse.  Wash your hands immediately  after removing your facemask and gloves.  If your personal clothing becomes contaminated, carefully remove clothing and launder. Wash your hands after handling contaminated clothing.  Place all used disposable facemasks, gloves, and other waste in a lined container before disposing them with other household waste.  Remove gloves and wash your hands immediately after handling these items.  Do not share dishes, glasses, or other household items with the patient  Avoid sharing household items. You should not share dishes, drinking glasses, cups, eating utensils, towels, bedding, or other items with a patient who is confirmed to have, or being evaluated for, COVID-19 infection.  After the person uses these items, you should wash them thoroughly with soap and water.  Wash laundry thoroughly  Immediately remove and wash clothes or bedding that have blood, body fluids, and/or secretions or excretions, such as sweat, saliva, sputum, nasal mucus, vomit, urine, or feces, on them.  Wear gloves when handling laundry from the patient.  Read and follow directions on labels of laundry or clothing  items and detergent. In general, wash and dry with the warmest temperatures recommended on the label.  Clean all areas the individual has used often  Clean all touchable surfaces, such as counters, tabletops, doorknobs, bathroom fixtures, toilets, phones, keyboards, tablets, and bedside tables, every day. Also, clean any surfaces that may have blood, body fluids, and/or secretions or excretions on them.  Wear gloves when cleaning surfaces the patient has come in contact with.  Use a diluted bleach solution (e.g., dilute bleach with 1 part bleach and 10 parts water) or a household disinfectant with a label that says EPA-registered for coronaviruses. To make a bleach solution at home, add 1 tablespoon of bleach to 1 quart (4 cups) of water. For a larger supply, add  cup of bleach to 1 gallon (16 cups) of  water.  Read labels of cleaning products and follow recommendations provided on product labels. Labels contain instructions for safe and effective use of the cleaning product including precautions you should take when applying the product, such as wearing gloves or eye protection and making sure you have good ventilation during use of the product.  Remove gloves and wash hands immediately after cleaning.  Monitor yourself for signs and symptoms of illness Caregivers and household members are considered close contacts, should monitor their health, and will be asked to limit movement outside of the home to the extent possible. Follow the monitoring steps for close contacts listed on the symptom monitoring form.   ? If you have additional questions, contact your local health department or call the epidemiologist on call at [phone] (available 24/7). ? This guidance is subject to change. For the most up-to-date guidance from CDC, please refer to their website: YouBlogs.pl    Activity:  As tolerated with Full fall precautions use walker/cane & assistance as needed  Discharge Instructions    Call MD for:  difficulty breathing, headache or visual disturbances   Complete by: As directed    Call MD for:  extreme fatigue   Complete by: As directed    Call MD for:  persistant dizziness or light-headedness   Complete by: As directed    Diet - low sodium heart healthy   Complete by: As directed    Discharge instructions   Complete by: As directed    Follow with Primary MD  Center[facility] in 1-2 weeks  Please follow with your primary cardiologist in the next 1-2 weeks.  Please take all your medications as prescribed by outpatient physicians.  Please get a complete blood count and chemistry panel checked by your Primary MD at your next visit, and again as instructed by your Primary MD.  Get Medicines reviewed and  adjusted: Please take all your medications with you for your next visit with your Primary MD  Laboratory/radiological data: Please request your Primary MD to go over all hospital tests and procedure/radiological results at the follow up, please ask your Primary MD to get all Hospital records sent to his/her office.  In some cases, they will be blood work, cultures and biopsy results pending at the time of your discharge. Please request that your primary care M.D. follows up on these results.  Also Note the following: If you experience worsening of your admission symptoms, develop shortness of breath, life threatening emergency, suicidal or homicidal thoughts you must seek medical attention immediately by calling 911 or calling your MD immediately  if symptoms less severe.  You must read complete instructions/literature along with all the possible adverse reactions/side  effects for all the Medicines you take and that have been prescribed to you. Take any new Medicines after you have completely understood and accpet all the possible adverse reactions/side effects.   Do not drive when taking Pain medications or sleeping medications (Benzodaizepines)  Do not take more than prescribed Pain, Sleep and Anxiety Medications. It is not advisable to combine anxiety,sleep and pain medications without talking with your primary care practitioner  Special Instructions: If you have smoked or chewed Tobacco  in the last 2 yrs please stop smoking, stop any regular Alcohol  and or any Recreational drug use.  Wear Seat belts while driving.  Please note: You were cared for by a hospitalist during your hospital stay. Once you are discharged, your primary care physician will handle any further medical issues. Please note that NO REFILLS for any discharge medications will be authorized once you are discharged, as it is imperative that you return to your primary care physician (or establish a relationship with a  primary care physician if you do not have one) for your post hospital discharge needs so that they can reassess your need for medications and monitor your lab values.   1.  10 days of isolation  2.  If you develop worsening shortness of breath please seek immediate medical attention.   Increase activity slowly   Complete by: As directed      Allergies as of 06/26/2020      Reactions   No Known Allergies       Medication List    STOP taking these medications   losartan 100 MG tablet Commonly known as: COZAAR     TAKE these medications   apixaban 5 MG Tabs tablet Commonly known as: ELIQUIS Take 1 tablet (5 mg total) by mouth 2 (two) times daily.   atorvastatin 40 MG tablet Commonly known as: LIPITOR Take 1 tablet (40 mg total) by mouth daily.   diltiazem 180 MG 24 hr capsule Commonly known as: CARDIZEM CD Take 1 capsule (180 mg total) by mouth daily.   furosemide 40 MG tablet Commonly known as: LASIX Take 1 tablet (40 mg total) by mouth daily.   isosorbide mononitrate 120 MG 24 hr tablet Commonly known as: IMDUR Take 120 mg by mouth Daily.   levothyroxine 75 MCG tablet Commonly known as: SYNTHROID Take 75 mcg by mouth daily.   metoprolol succinate 50 MG 24 hr tablet Commonly known as: TOPROL-XL Take 1 tablet (50 mg total) by mouth daily. Take with or immediately following a meal.   spironolactone 25 MG tablet Commonly known as: ALDACTONE Take 1 tablet (25 mg total) by mouth daily.   traZODone 150 MG tablet Commonly known as: DESYREL Take 225 mg by mouth at bedtime.   venlafaxine XR 150 MG 24 hr capsule Commonly known as: EFFEXOR-XR Take 150 mg by mouth 2 (two) times a day.       [place], [place]. Schedule an appointment as soon as possible for a visit in 1 week(s).   Specialty: [place] information: [address] [place] [address] [phone]        [place], Dani Gobble, MD. Schedule an  appointment as soon as possible for a visit in 1 week(s).   Specialty: Cardiology Contact information: [address] [place] [address] [place], [facility] Follow up.   Specialty: Home Health Services Why: [facility], [place] Contact information:  [address]  [place] [address] [phone]              Allergies  Allergen Reactions  . No Known Allergies       Other Procedures/Studies: DG Chest 2 View  Result Date: 06/25/2020 CLINICAL DATA:  Fall EXAM: CHEST - 2 VIEW COMPARISON:  Her 03/13/2019 FINDINGS: Cardiomegaly. Both lungs are clear. Disc degenerative disease of the thoracic spine. IMPRESSION: Cardiomegaly without acute abnormality of the lungs. Electronically Signed   By: Eddie Candle M.D.   On: 06/25/2020 20:29   DG Elbow Complete Right  Result Date: 06/25/2020 CLINICAL DATA:  Pain status post fall EXAM: RIGHT ELBOW - COMPLETE 3+ VIEW COMPARISON:  None. FINDINGS: There is no evidence of fracture, dislocation, or joint effusion. There is no evidence of arthropathy or other focal bone abnormality. Soft tissues are unremarkable. IMPRESSION: Negative. Electronically Signed   By: Constance Holster M.D.   On: 06/25/2020 20:29   DG Hip Unilat W or Wo Pelvis 2-3 Views Right  Result Date: 06/25/2020 CLINICAL DATA:  Hip pain. EXAM: DG HIP (WITH OR WITHOUT PELVIS) 2-3V RIGHT COMPARISON:  None. FINDINGS: Mild degenerative changes are noted of both hips. Phleboliths project over the patient's pelvis. There is no acute displaced fracture or dislocation. IMPRESSION: Negative. Electronically Signed   By: Constance Holster M.D.   On: 06/25/2020 20:31     TODAY-DAY OF DISCHARGE:  Subjective:   [name] [name] today has no headache,no chest abdominal pain,no new weakness tingling or numbness, feels much better wants to go home today.   Objective:   Blood pressure (!) 119/58, pulse 86, temperature 98.3 F (36.8 C),  temperature source Oral, resp. rate 17, height 5\' 3"  (1.6 m), weight 87.2 kg, SpO2 95 %.  Intake/Output Summary (Last 24 hours) at 06/26/2020 1140 Last data filed at 06/26/2020 0835 Gross per 24 hour  Intake 981.61 ml  Output 200 ml  Net 781.61 ml   Filed Weights   06/25/20 1935 06/26/20 0018  Weight: 86.2 kg 87.2 kg    Exam: Awake Alert, Oriented *3, No new F.N deficits, Normal affect [place].AT,PERRAL Supple Neck,No JVD, No cervical lymphadenopathy appriciated.  Symmetrical Chest wall movement, Good air movement bilaterally, CTAB RRR,No Gallops,Rubs or new Murmurs, No Parasternal Heave +ve B.Sounds, Abd Soft, Non tender, No organomegaly appriciated, No rebound -guarding or rigidity. No Cyanosis, Clubbing or edema, No new Rash or bruise   PERTINENT RADIOLOGIC STUDIES: DG Chest 2 View  Result Date: 06/25/2020 CLINICAL DATA:  Fall EXAM: CHEST - 2 VIEW COMPARISON:  Her 03/13/2019 FINDINGS: Cardiomegaly. Both lungs are clear. Disc degenerative disease of the thoracic spine. IMPRESSION: Cardiomegaly without acute abnormality of the lungs. Electronically Signed   By: Eddie Candle M.D.   On: 06/25/2020 20:29   DG Elbow Complete Right  Result Date: 06/25/2020 CLINICAL DATA:  Pain status post fall EXAM: RIGHT ELBOW - COMPLETE 3+ VIEW COMPARISON:  None. FINDINGS: There is no evidence of fracture, dislocation, or joint effusion. There is no evidence of arthropathy or other focal bone abnormality. Soft tissues are unremarkable. IMPRESSION: Negative. Electronically Signed   By: Constance Holster M.D.   On: 06/25/2020 20:29   DG Hip Unilat W or Wo Pelvis 2-3 Views Right  Result Date: 06/25/2020 CLINICAL DATA:  Hip pain. EXAM: DG HIP (WITH OR WITHOUT PELVIS) 2-3V RIGHT COMPARISON:  None. FINDINGS: Mild degenerative changes are noted of both hips. Phleboliths project over the patient's pelvis. There is no acute displaced fracture or dislocation.  IMPRESSION: Negative. Electronically Signed   By:  Constance Holster M.D.   On: 06/25/2020 20:31     PERTINENT LAB RESULTS: CBC: Recent Labs    06/25/20 1945 06/26/20 0523  WBC 10.1 8.5  HGB 15.4* 15.1*  HCT 45.0 44.8  PLT 268 255   CMET CMP     Component Value Date/Time   NA 137 06/26/2020 0523   NA 142 02/12/2020 0812   K 3.3 (L) 06/26/2020 0523   CL 103 06/26/2020 0523   CO2 23 06/26/2020 0523   GLUCOSE 117 (H) 06/26/2020 0523   BUN 8 06/26/2020 0523   BUN 9 02/12/2020 0812   CREATININE 0.80 06/26/2020 0523   CALCIUM 9.3 06/26/2020 0523   PROT 7.1 06/25/2020 1945   ALBUMIN 3.9 06/25/2020 1945   AST 26 06/25/2020 1945   ALT 23 06/25/2020 1945   ALKPHOS 60 06/25/2020 1945   BILITOT 1.0 06/25/2020 1945   GFRNONAA >60 06/26/2020 0523   GFRAA 65 02/12/2020 0812    GFR Estimated Creatinine Clearance: 54.8 mL/min (by C-G formula based on SCr of 0.8 mg/dL). No results for input(s): LIPASE, AMYLASE in the last 72 hours. No results for input(s): CKTOTAL, CKMB, CKMBINDEX, TROPONINI in the last 72 hours. Invalid input(s): POCBNP No results for input(s): DDIMER in the last 72 hours. No results for input(s): HGBA1C in the last 72 hours. No results for input(s): CHOL, HDL, LDLCALC, TRIG, CHOLHDL, LDLDIRECT in the last 72 hours. Recent Labs    06/26/20 0523  TSH 2.997   No results for input(s): VITAMINB12, FOLATE, FERRITIN, TIBC, IRON, RETICCTPCT in the last 72 hours. Coags: No results for input(s): INR in the last 72 hours.  Invalid input(s): PT Microbiology: Recent Results (from the past 240 hour(s))  Respiratory Panel by RT PCR (Flu A&B, Covid) - Nasopharyngeal Swab     Status: Abnormal   Collection Time: 06/25/20 10:06 PM   Specimen: Nasopharyngeal Swab  Result Value Ref Range Status   SARS Coronavirus 2 by RT PCR POSITIVE (A) NEGATIVE Final    Comment: RESULT CALLED TO, READ BACK BY AND VERIFIED WITH: G TATE RN 06/25/20 AT 2356 SK (NOTE) SARS-CoV-2 target nucleic acids are DETECTED.  SARS-CoV-2 RNA is  generally detectable in upper respiratory specimens  during the acute phase of infection. Positive results are indicative of the presence of the identified virus, but do not rule out bacterial infection or co-infection with other pathogens not detected by the test. Clinical correlation with patient history and other diagnostic information is necessary to determine patient infection status. The expected result is Negative.  Fact Sheet for Patients:  PinkCheek.be  Fact Sheet for Healthcare Providers: GravelBags.it  This test is not yet approved or cleared by the [place] FDA and  has been authorized for detection and/or diagnosis of SARS-CoV-2 by FDA under an Emergency Use Authorization (EUA).  This EUA will remain in effect (meaning this test can be used)  for the duration of  the COVID-19 declaration under Section 564(b)(1) of the Act, 21 U.S.C. section 360bbb-3(b)(1), unless the authorization is terminated or revoked sooner.      Influenza A by PCR NEGATIVE NEGATIVE Final   Influenza B by PCR NEGATIVE NEGATIVE Final    Comment: (NOTE) The Xpert Xpress SARS-CoV-2/FLU/RSV assay is intended as an aid in  the diagnosis of influenza from Nasopharyngeal swab specimens and  should not be used as a sole basis for treatment. Nasal washings and  aspirates are unacceptable for Xpert Xpress SARS-CoV-2/FLU/RSV  testing.  Fact Sheet for Patients: PinkCheek.be  Fact Sheet for Healthcare Providers: GravelBags.it  This test is not yet approved or cleared by the [place] FDA and  has been authorized for detection and/or diagnosis of SARS-CoV-2 by  FDA under an Emergency Use Authorization (EUA). This EUA will remain  in effect (meaning this test can be used) for the duration of the  Covid-19 declaration under Section 564(b)(1) of the Act, 21  U.S.C. section  360bbb-3(b)(1), unless the authorization is  terminated or revoked. Performed at [facility], [place] [address], [place], [place] [address]     FURTHER DISCHARGE INSTRUCTIONS:  Get Medicines reviewed and adjusted: Please take all your medications with you for your next visit with your Primary MD  Laboratory/radiological data: Please request your Primary MD to go over all hospital tests and procedure/radiological results at the follow up, please ask your Primary MD to get all Hospital records sent to his/her office.  In some cases, they will be blood work, cultures and biopsy results pending at the time of your discharge. Please request that your primary care M.D. goes through all the records of your hospital data and follows up on these results.  Also Note the following: If you experience worsening of your admission symptoms, develop shortness of breath, life threatening emergency, suicidal or homicidal thoughts you must seek medical attention immediately by calling 911 or calling your MD immediately  if symptoms less severe.  You must read complete instructions/literature along with all the possible adverse reactions/side effects for all the Medicines you take and that have been prescribed to you. Take any new Medicines after you have completely understood and accpet all the possible adverse reactions/side effects.   Do not drive when taking Pain medications or sleeping medications (Benzodaizepines)  Do not take more than prescribed Pain, Sleep and Anxiety Medications. It is not advisable to combine anxiety,sleep and pain medications without talking with your primary care practitioner  Special Instructions: If you have smoked or chewed Tobacco  in the last 2 yrs please stop smoking, stop any regular Alcohol  and or any Recreational drug use.  Wear Seat belts while driving.  Please note: You were cared for by a hospitalist during your hospital stay. Once you are discharged, your  primary care physician will handle any further medical issues. Please note that NO REFILLS for any discharge medications will be authorized once you are discharged, as it is imperative that you return to your primary care physician (or establish a relationship with a primary care physician if you do not have one) for your post hospital discharge needs so that they can reassess your need for medications and monitor your lab values.  Total Time spent coordinating discharge including counseling, education and face to face time equals 35 minutes.  SignedOren Binet 06/26/2020 11:40 AM

## 2020-06-26 NOTE — Evaluation (Signed)
Physical Therapy Evaluation Patient Details Name: [name] [name] MRN: [id number] DOB: 07-Jul-1934 Today's Date: 06/26/2020   History of Present Illness  Pt adm after fall at home. PMH - afib, cad, chf, htn, depression/anxiety, DM, back fusion, bil TKR.   Clinical Impression  Pt moving fairly well but is confused this PM which nurse tech and nurse say is new. Could tell me where she was and why she came but repeatedly thought and referred to nurse tech as a family member named Radonna Ricker even after told it was not Radonna Ricker. Decr attention and impulsive. Recommend pt have 24 hour supervision at home until confusion is better.     Follow Up Recommendations Home health PT;Supervision/Assistance - 24 hour    Equipment Recommendations  Rolling walker with 5" wheels    Recommendations for Other Services       Precautions / Restrictions Precautions Precautions: Fall      Mobility  Bed Mobility Overal bed mobility: Modified Independent             General bed mobility comments: Use o rail    Transfers Overall transfer level: Needs assistance Equipment used: Rolling walker (2 wheeled);None Transfers: Sit to/from Stand Sit to Stand: Supervision         General transfer comment: supervision for safety due to confusion  Ambulation/Gait Ambulation/Gait assistance: Supervision Gait Distance (Feet): 30 Feet (x 2) Assistive device: Rolling walker (2 wheeled) Gait Pattern/deviations: Step-through pattern;Decreased stride length Gait velocity: decr Gait velocity interpretation: <1.8 ft/sec, indicate of risk for recurrent falls General Gait Details: supervision for safety due to confusion  Stairs            Wheelchair Mobility    Modified Rankin (Stroke Patients Only)       Balance Overall balance assessment: Needs assistance Sitting-balance support: No upper extremity supported;Feet supported Sitting balance-Leahy Scale: Normal     Standing balance support: No upper  extremity supported;During functional activity Standing balance-Leahy Scale: Good                               Pertinent Vitals/Pain Pain Assessment: No/denies pain    Home Living Family/patient expects to be discharged to:: Private residence Living Arrangements: Children Available Help at Discharge: Available PRN/intermittently Type of Home: House Home Access: Stairs to enter;Other (comment) (ramp in the back) Entrance Stairs-Rails: Can reach both Entrance Stairs-Number of Steps: 7 Home Layout: One level Home Equipment: Grab bars - tub/shower;Cane - single point      Prior Function Level of Independence: Independent with assistive device(s)         Comments: Uses cane      Hand Dominance   Dominant Hand: Right    Extremity/Trunk Assessment   Upper Extremity Assessment Upper Extremity Assessment: Defer to OT evaluation    Lower Extremity Assessment Lower Extremity Assessment: Generalized weakness       Communication   Communication: No difficulties  Cognition Arousal/Alertness: Awake/alert Behavior During Therapy: Impulsive Overall Cognitive Status: Impaired/Different from baseline Area of Impairment: Orientation;Memory;Attention;Safety/judgement                 Orientation Level: Disoriented to;Time Current Attention Level: Sustained Memory: Decreased short-term memory   Safety/Judgement: Decreased awareness of safety     General Comments: Pt repeatedly referring to nurse tech as her granddaughter.       General Comments      Exercises     Assessment/Plan    PT  Assessment Patient needs continued PT services  PT Problem List Decreased strength;Decreased balance;Decreased mobility;Decreased cognition;Decreased safety awareness       PT Treatment Interventions DME instruction;Gait training;Functional mobility training;Therapeutic activities;Therapeutic exercise;Balance training;Patient/family education;Cognitive remediation     PT Goals (Current goals can be found in the Care Plan section)  Acute Rehab PT Goals Patient Stated Goal: What day is it PT Goal Formulation: With patient Time For Goal Achievement: 07/03/20 Potential to Achieve Goals: Good    Frequency Min 3X/week   Barriers to discharge        Co-evaluation               AM-PAC PT "6 Clicks" Mobility  Outcome Measure Help needed turning from your back to your side while in a flat bed without using bedrails?: None Help needed moving from lying on your back to sitting on the side of a flat bed without using bedrails?: None Help needed moving to and from a bed to a chair (including a wheelchair)?: None Help needed standing up from a chair using your arms (e.g., wheelchair or bedside chair)?: None Help needed to walk in hospital room?: A Little Help needed climbing 3-5 steps with a railing? : A Little 6 Click Score: 22    End of Session   Activity Tolerance: Patient tolerated treatment well Patient left: in bed;with call bell/phone within reach;with bed alarm set Nurse Communication: Mobility status;Other (comment) (confusion) PT Visit Diagnosis: History of falling (Z91.81)    Time: 1610-9604 PT Time Calculation (min) (ACUTE ONLY): 16 min   Charges:   PT Evaluation $PT Eval Moderate Complexity: Angus Pager [phone] Office [place] 06/26/2020, 2:45 PM

## 2020-06-26 NOTE — Progress Notes (Signed)
Infusion completed.  Patient denies any pain or s/s of allergic reaction.  Vital signs stable.  Will continue to monitor.

## 2020-06-26 NOTE — Discharge Instructions (Signed)
Person Under Monitoring Name: [name] [name]  Location: [place] [address]   Infection Prevention Recommendations for Individuals Confirmed to have, or Being Evaluated for, 2019 Novel Coronavirus (COVID-19) Infection Who Receive Care at Home  Individuals who are confirmed to have, or are being evaluated for, COVID-19 should follow the prevention steps below until a healthcare provider or local or state health department says they can return to normal activities.  Stay home except to get medical care You should restrict activities outside your home, except for getting medical care. Do not go to work, school, or public areas, and do not use public transportation or taxis.  Call ahead before visiting your doctor Before your medical appointment, call the healthcare provider and tell them that you have, or are being evaluated for, COVID-19 infection. This will help the healthcare provider's office take steps to keep other people from getting infected. Ask your healthcare provider to call the local or state health department.  Monitor your symptoms Seek prompt medical attention if your illness is worsening (e.g., difficulty breathing). Before going to your medical appointment, call the healthcare provider and tell them that you have, or are being evaluated for, COVID-19 infection. Ask your healthcare provider to call the local or state health department.  Wear a facemask You should wear a facemask that covers your nose and mouth when you are in the same room with other people and when you visit a healthcare provider. People who live with or visit you should also wear a facemask while they are in the same room with you.  Separate yourself from other people in your home As much as possible, you should stay in a different room from other people in your home. Also, you should use a separate bathroom, if available.  Avoid sharing household items You should not  share dishes, drinking glasses, cups, eating utensils, towels, bedding, or other items with other people in your home. After using these items, you should wash them thoroughly with soap and water.  Cover your coughs and sneezes Cover your mouth and nose with a tissue when you cough or sneeze, or you can cough or sneeze into your sleeve. Throw used tissues in a lined trash can, and immediately wash your hands with soap and water for at least 20 seconds or use an alcohol-based hand rub.  Wash your [facility] your hands often and thoroughly with soap and water for at least 20 seconds. You can use an alcohol-based hand sanitizer if soap and water are not available and if your hands are not visibly dirty. Avoid touching your eyes, nose, and mouth with unwashed hands.   Prevention Steps for Caregivers and Household Members of Individuals Confirmed to have, or Being Evaluated for, COVID-19 Infection Being Cared for in the Home  If you live with, or provide care at home for, a person confirmed to have, or being evaluated for, COVID-19 infection please follow these guidelines to prevent infection:  Follow healthcare provider's instructions Make sure that you understand and can help the patient follow any healthcare provider instructions for all care.  Provide for the patient's basic needs You should help the patient with basic needs in the home and provide support for getting groceries, prescriptions, and other personal needs.  Monitor the patient's symptoms If they are getting sicker, call his or her medical provider and tell them that the patient has, or is being evaluated for, COVID-19 infection. This will help the healthcare provider's  office take steps to keep other people from getting infected. Ask the healthcare provider to call the local or state health department.  Limit the number of people who have contact with the patient  If possible, have only one caregiver for the  patient.  Other household members should stay in another home or place of residence. If this is not possible, they should stay  in another room, or be separated from the patient as much as possible. Use a separate bathroom, if available.  Restrict visitors who do not have an essential need to be in the home.  Keep older adults, very young children, and other sick people away from the patient Keep older adults, very young children, and those who have compromised immune systems or chronic health conditions away from the patient. This includes people with chronic heart, lung, or kidney conditions, diabetes, and cancer.  Ensure good ventilation Make sure that shared spaces in the home have good air flow, such as from an air conditioner or an opened window, weather permitting.  Wash your hands often  Wash your hands often and thoroughly with soap and water for at least 20 seconds. You can use an alcohol based hand sanitizer if soap and water are not available and if your hands are not visibly dirty.  Avoid touching your eyes, nose, and mouth with unwashed hands.  Use disposable paper towels to dry your hands. If not available, use dedicated cloth towels and replace them when they become wet.  Wear a facemask and gloves  Wear a disposable facemask at all times in the room and gloves when you touch or have contact with the patient's blood, body fluids, and/or secretions or excretions, such as sweat, saliva, sputum, nasal mucus, vomit, urine, or feces.  Ensure the mask fits over your nose and mouth tightly, and do not touch it during use.  Throw out disposable facemasks and gloves after using them. Do not reuse.  Wash your hands immediately after removing your facemask and gloves.  If your personal clothing becomes contaminated, carefully remove clothing and launder. Wash your hands after handling contaminated clothing.  Place all used disposable facemasks, gloves, and other waste in a lined  container before disposing them with other household waste.  Remove gloves and wash your hands immediately after handling these items.  Do not share dishes, glasses, or other household items with the patient  Avoid sharing household items. You should not share dishes, drinking glasses, cups, eating utensils, towels, bedding, or other items with a patient who is confirmed to have, or being evaluated for, COVID-19 infection.  After the person uses these items, you should wash them thoroughly with soap and water.  Wash laundry thoroughly  Immediately remove and wash clothes or bedding that have blood, body fluids, and/or secretions or excretions, such as sweat, saliva, sputum, nasal mucus, vomit, urine, or feces, on them.  Wear gloves when handling laundry from the patient.  Read and follow directions on labels of laundry or clothing items and detergent. In general, wash and dry with the warmest temperatures recommended on the label.  Clean all areas the individual has used often  Clean all touchable surfaces, such as counters, tabletops, doorknobs, bathroom fixtures, toilets, phones, keyboards, tablets, and bedside tables, every day. Also, clean any surfaces that may have blood, body fluids, and/or secretions or excretions on them.  Wear gloves when cleaning surfaces the patient has come in contact with.  Use a diluted bleach solution (e.g., dilute bleach with 1  part bleach and 10 parts water) or a household disinfectant with a label that says EPA-registered for coronaviruses. To make a bleach solution at home, add 1 tablespoon of bleach to 1 quart (4 cups) of water. For a larger supply, add  cup of bleach to 1 gallon (16 cups) of water.  Read labels of cleaning products and follow recommendations provided on product labels. Labels contain instructions for safe and effective use of the cleaning product including precautions you should take when applying the product, such as wearing gloves or  eye protection and making sure you have good ventilation during use of the product.  Remove gloves and wash hands immediately after cleaning.  Monitor yourself for signs and symptoms of illness Caregivers and household members are considered close contacts, should monitor their health, and will be asked to limit movement outside of the home to the extent possible. Follow the monitoring steps for close contacts listed on the symptom monitoring form.   ? If you have additional questions, contact your local health department or call the epidemiologist on call at [phone] (available 24/7). ? This guidance is subject to change. For the most up-to-date guidance from [facility], please refer to their website: YouBlogs.pl

## 2020-10-15 IMAGING — CT CT ANGIOGRAPHY CHEST
2 of 6 series · 18 of 46 positions shown · IV contrast (omnipaque)
Comparison: 03/12/2019 radiograph

CLINICAL DATA: Chest heaviness and diaphoresis

EXAM:
CT ANGIOGRAPHY CHEST WITH CONTRAST
TECHNIQUE: Multidetector CT imaging of the chest was performed using the
standard protocol during bolus administration of intravenous
contrast. Multiplanar CT image reconstructions and MIPs were
obtained to evaluate the vascular anatomy.
CONTRAST:  75mL OMNIPAQUE IOHEXOL 350 MG/ML SOLN

[Series 6: thins · axial · 0.94mm/px · z∈[+1177,+1386]mm · 15 of 229 slices shown]
[im 10/229  lung]
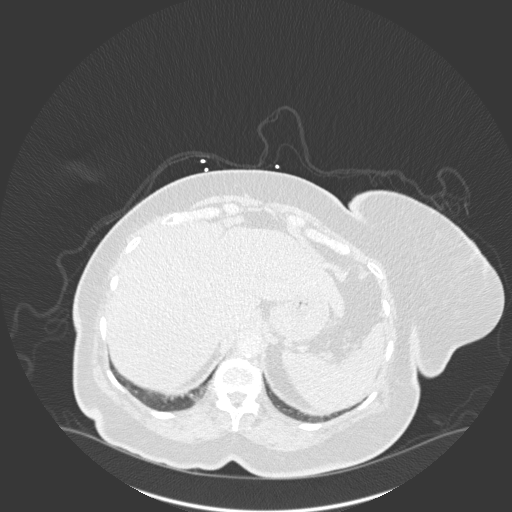
[im 30/229  soft-tissue]
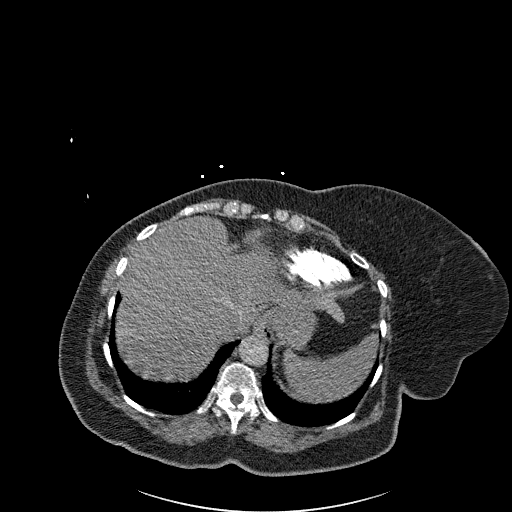
[im 40/229  lung]
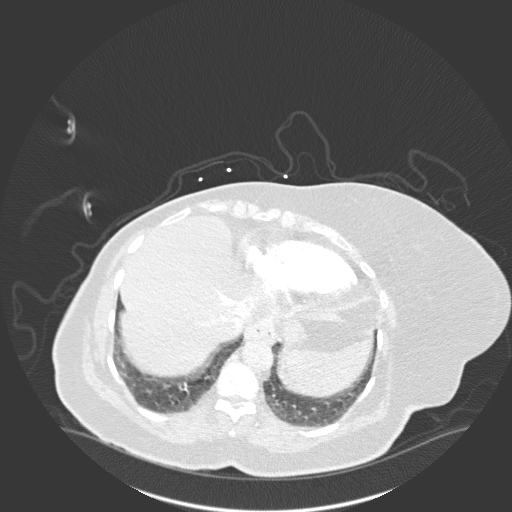
[im 60/229  soft-tissue]
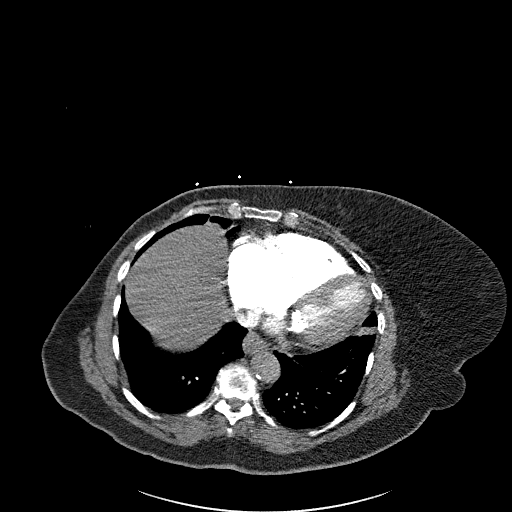
[im 70/229  lung]
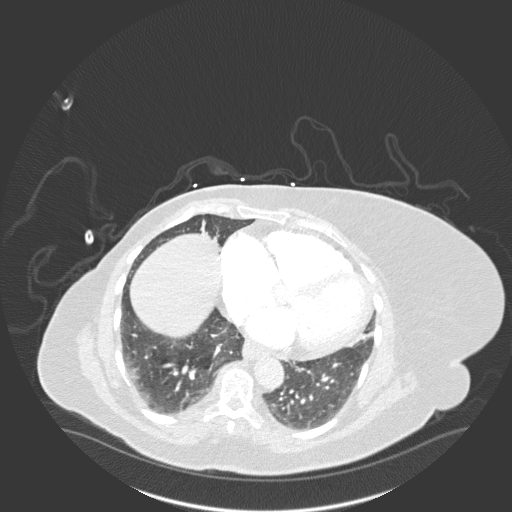
[im 90/229  soft-tissue]
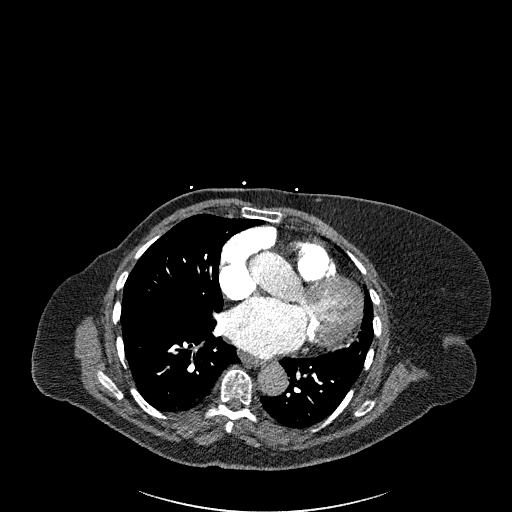
[im 100/229  lung]
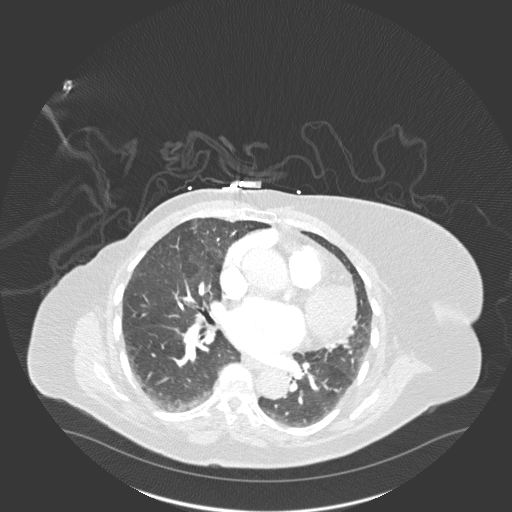
[im 119/229  soft-tissue]
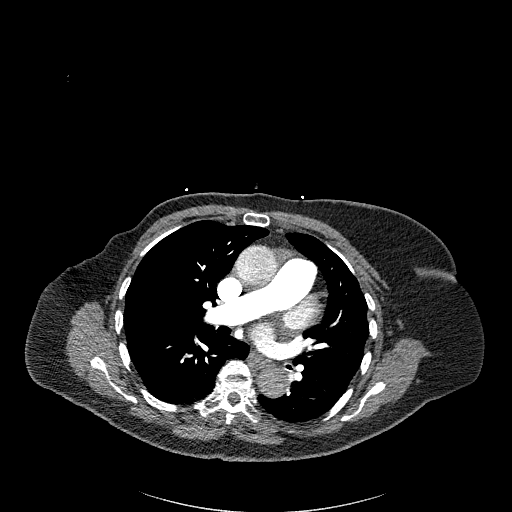
[im 129/229  lung]
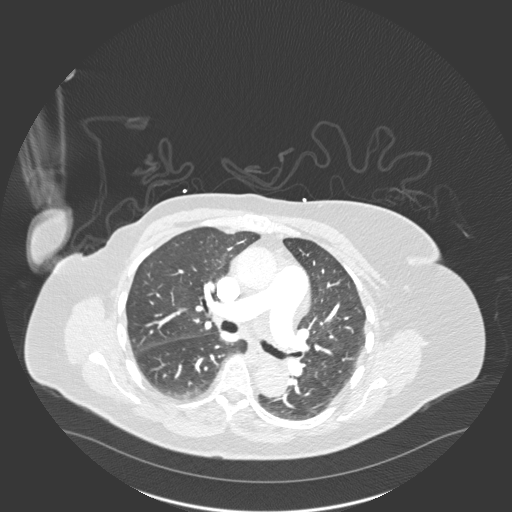
[im 139/229  soft-tissue]
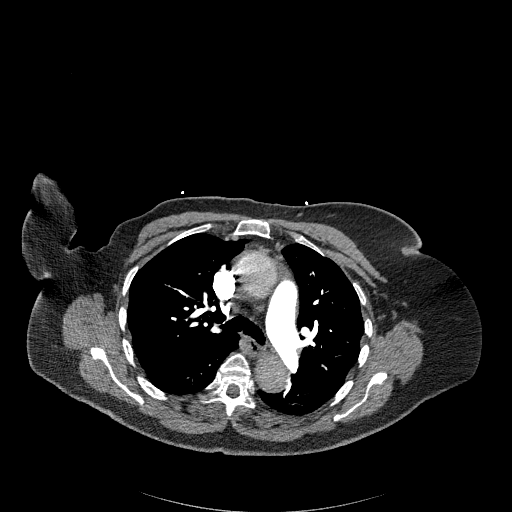
[im 159/229  lung]
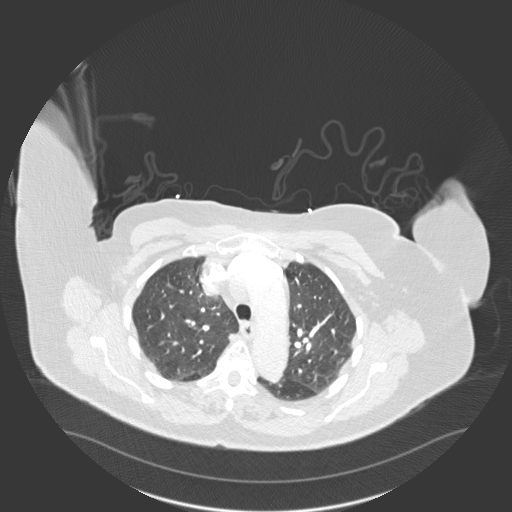
[im 169/229  soft-tissue]
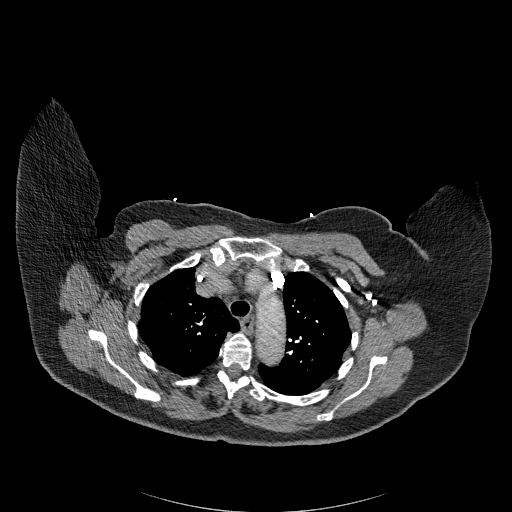
[im 189/229  lung]
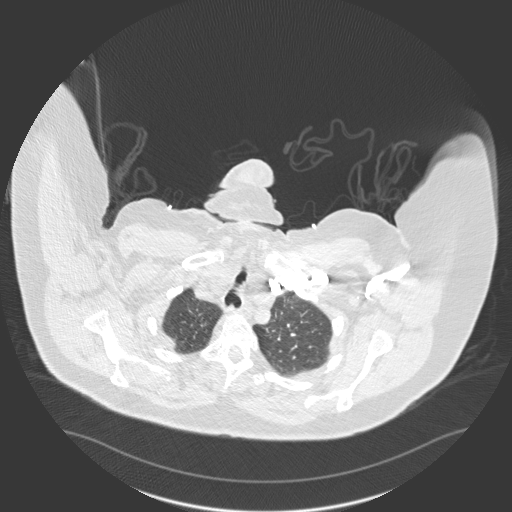
[im 199/229  soft-tissue]
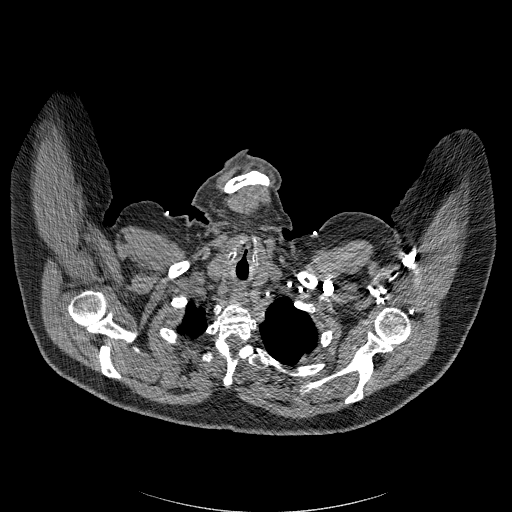
[im 219/229  lung]
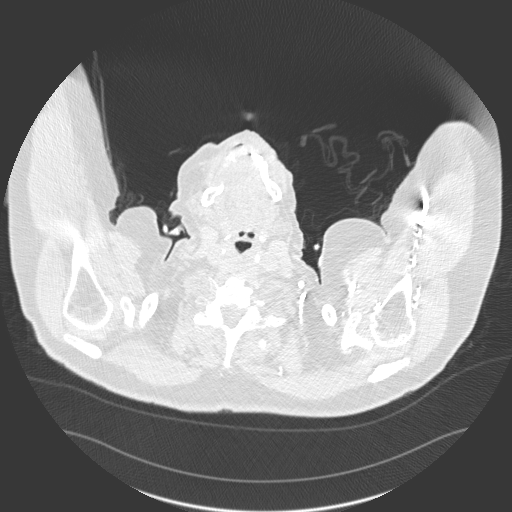

[Series 8: coronal mpr · coronal · 0.46mm/px · 3 of 140 slices shown]
[im 35/140  soft-tissue]
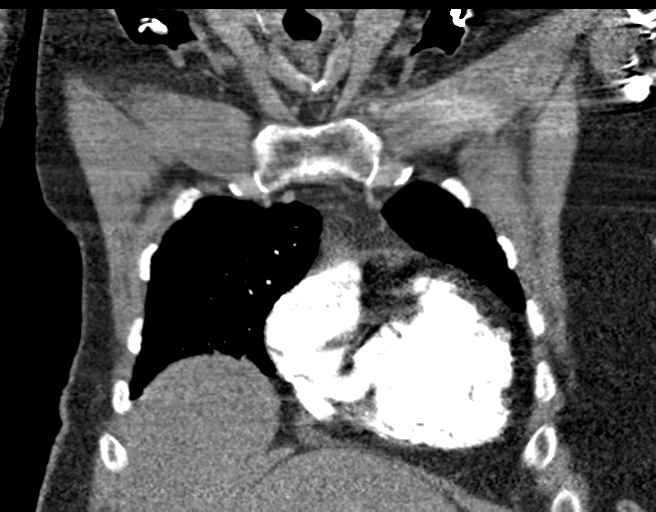
[im 70/140  soft-tissue]
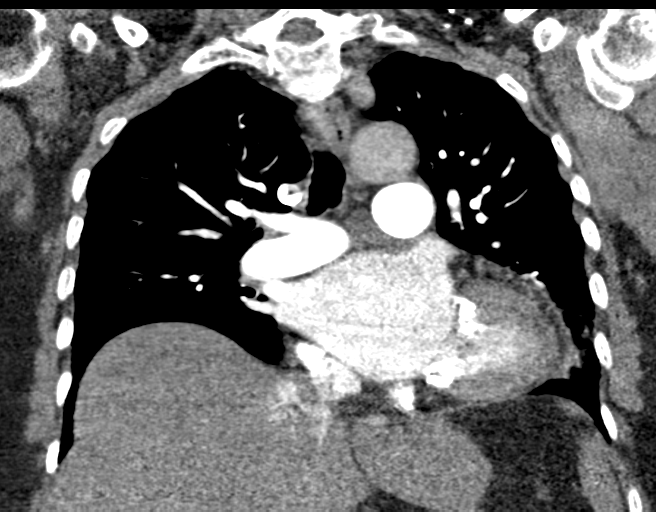
[im 105/140  soft-tissue]
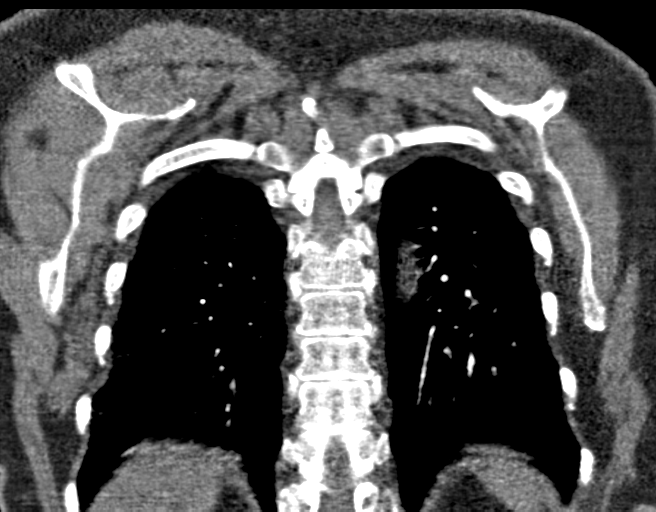

[18 of 46 positions shown; findings below may reference images not displayed]

FINDINGS: Cardiovascular: Satisfactory opacification of the pulmonary arteries
to the segmental level. No evidence of pulmonary embolism.
Nonaneurysmal aorta. Mild aortic atherosclerosis. Coronary vascular
calcification. Mild cardiomegaly. No pericardial effusion.

Mediastinum/Nodes: Midline trachea. No thyroid mass. No significant
adenopathy. Small distal esophageal hiatal hernia

Lungs/Pleura: Lungs are clear. No pleural effusion or pneumothorax.

Upper Abdomen: No acute abnormality.

Musculoskeletal: Degenerative changes. Incompletely visualized
superior endplate fracture at T12.

Review of the MIP images confirms the above findings.
IMPRESSION: 1. Negative for acute pulmonary embolus.
2. Clear lung fields

Aortic Atherosclerosis (RO4P1-P68.8).

## 2021-01-26 ENCOUNTER — Ambulatory Visit (INDEPENDENT_AMBULATORY_CARE_PROVIDER_SITE_OTHER): Payer: Medicare (Managed Care) | Admitting: Internal Medicine

## 2021-01-26 ENCOUNTER — Other Ambulatory Visit: Payer: Self-pay

## 2021-01-26 ENCOUNTER — Encounter: Payer: Self-pay | Admitting: Internal Medicine

## 2021-01-26 VITALS — BP 132/68 | HR 99 | Temp 98.4°F | Resp 16 | Ht 62.0 in | Wt 195.4 lb

## 2021-01-26 DIAGNOSIS — N1832 Chronic kidney disease, stage 3b: Secondary | ICD-10-CM

## 2021-01-26 DIAGNOSIS — I10 Essential (primary) hypertension: Secondary | ICD-10-CM

## 2021-01-26 DIAGNOSIS — E119 Type 2 diabetes mellitus without complications: Secondary | ICD-10-CM | POA: Insufficient documentation

## 2021-01-26 DIAGNOSIS — E781 Pure hyperglyceridemia: Secondary | ICD-10-CM

## 2021-01-26 DIAGNOSIS — I5032 Chronic diastolic (congestive) heart failure: Secondary | ICD-10-CM

## 2021-01-26 DIAGNOSIS — E039 Hypothyroidism, unspecified: Secondary | ICD-10-CM

## 2021-01-26 DIAGNOSIS — Z23 Encounter for immunization: Secondary | ICD-10-CM

## 2021-01-26 DIAGNOSIS — N182 Chronic kidney disease, stage 2 (mild): Secondary | ICD-10-CM

## 2021-01-26 DIAGNOSIS — R739 Hyperglycemia, unspecified: Secondary | ICD-10-CM

## 2021-01-26 DIAGNOSIS — E785 Hyperlipidemia, unspecified: Secondary | ICD-10-CM | POA: Diagnosis not present

## 2021-01-26 DIAGNOSIS — E1122 Type 2 diabetes mellitus with diabetic chronic kidney disease: Secondary | ICD-10-CM

## 2021-01-26 DIAGNOSIS — I4821 Permanent atrial fibrillation: Secondary | ICD-10-CM

## 2021-01-26 DIAGNOSIS — F418 Other specified anxiety disorders: Secondary | ICD-10-CM

## 2021-01-26 LAB — BASIC METABOLIC PANEL
BUN: 15 mg/dL (ref 6–23)
CO2: 32 mEq/L (ref 19–32)
Calcium: 9.9 mg/dL (ref 8.4–10.5)
Chloride: 99 mEq/L (ref 96–112)
Creatinine, Ser: 0.98 mg/dL (ref 0.40–1.20)
GFR: 52.7 mL/min — ABNORMAL LOW (ref >60.00)
Glucose, Bld: 110 mg/dL — ABNORMAL HIGH (ref 70–99)
Potassium: 3.8 mEq/L (ref 3.5–5.1)
Sodium: 140 mEq/L (ref 135–145)

## 2021-01-26 LAB — TSH: TSH: 3.49 u[IU]/mL (ref 0.35–4.50)

## 2021-01-26 LAB — CBC WITH DIFFERENTIAL/PLATELET
Basophils Absolute: 0 10*3/uL (ref 0.0–0.1)
Basophils Relative: 0.5 % (ref 0.0–3.0)
Eosinophils Absolute: 0.2 10*3/uL (ref 0.0–0.7)
Eosinophils Relative: 2.1 % (ref 0.0–5.0)
HCT: 41.7 % (ref 36.0–46.0)
Hemoglobin: 14.3 g/dL (ref 12.0–15.0)
Lymphocytes Relative: 24 % (ref 12.0–46.0)
Lymphs Abs: 2.1 10*3/uL (ref 0.7–4.0)
MCHC: 34.2 g/dL (ref 30.0–36.0)
MCV: 93.1 fl (ref 78.0–100.0)
Monocytes Absolute: 0.7 10*3/uL (ref 0.1–1.0)
Monocytes Relative: 8.4 % (ref 3.0–12.0)
Neutro Abs: 5.8 10*3/uL (ref 1.4–7.7)
Neutrophils Relative %: 65 % (ref 43.0–77.0)
Platelets: 297 10*3/uL (ref 150.0–400.0)
RBC: 4.48 Mil/uL (ref 3.87–5.11)
RDW: 14.2 % (ref 11.5–15.5)
WBC: 8.9 10*3/uL (ref 4.0–10.5)

## 2021-01-26 LAB — LIPID PANEL
Cholesterol: 156 mg/dL (ref 0–200)
HDL: 53.3 mg/dL (ref >39.00)
NonHDL: 103.15
Total CHOL/HDL Ratio: 3
Triglycerides: 220 mg/dL — ABNORMAL HIGH (ref 0.0–149.0)
VLDL: 44 mg/dL — ABNORMAL HIGH (ref 0.0–40.0)

## 2021-01-26 LAB — HEMOGLOBIN A1C: Hgb A1c MFr Bld: 6.6 % — ABNORMAL HIGH (ref 4.6–6.5)

## 2021-01-26 LAB — MAGNESIUM: Magnesium: 1.9 mg/dL (ref 1.5–2.5)

## 2021-01-26 LAB — LDL CHOLESTEROL, DIRECT: Direct LDL: 87 mg/dL

## 2021-01-26 MED ORDER — APIXABAN 5 MG PO TABS: 5.0000 mg | ORAL_TABLET | Freq: Two times a day (BID) | ORAL | 1 refills | Status: DC

## 2021-01-26 MED ORDER — SHINGRIX 50 MCG/0.5ML IM SUSR: 0.5000 mL | Freq: Once | INTRAMUSCULAR | 1 refills | Status: AC

## 2021-01-26 MED ORDER — VENLAFAXINE HCL ER 150 MG PO CP24: 150.0000 mg | ORAL_CAPSULE | Freq: Every day | ORAL | 1 refills | Status: DC

## 2021-01-26 MED ORDER — TRAZODONE HCL 150 MG PO TABS: 225.0000 mg | ORAL_TABLET | Freq: Every day | ORAL | 1 refills | Status: DC

## 2021-01-26 MED ORDER — ATORVASTATIN CALCIUM 40 MG PO TABS: 40.0000 mg | ORAL_TABLET | Freq: Every day | ORAL | 1 refills | Status: DC

## 2021-01-26 MED ORDER — BOOSTRIX 5-2.5-18.5 LF-MCG/0.5 IM SUSP: 0.5000 mL | Freq: Once | INTRAMUSCULAR | 0 refills | Status: AC

## 2021-01-26 NOTE — Patient Instructions (Signed)
Atrial Fibrillation  Atrial fibrillation is a type of irregular or rapid heartbeat (arrhythmia). In atrial fibrillation, the top part of the heart (atria) beats in an irregular pattern. This makes the heart unable to pump bloodnormally and effectively. The goal of treatment is to prevent blood clots from forming, control your heart rate, or restore your heartbeat to a normal rhythm. If this condition is not treated, it can cause serious problems, such as a weakened heart muscle (cardiomyopathy) or a stroke. What are the causes? This condition is often caused by medical conditions that damage the heart's electrical system. These include: High blood pressure (hypertension). This is the most common cause. Certain heart problems or conditions, such as heart failure, coronary artery disease, heart valve problems, or heart surgery. Diabetes. Overactive thyroid (hyperthyroidism). Obesity. Chronic kidney disease. In some cases, the cause of this condition is not known. What increases the risk? This condition is more likely to develop in: Older people. People who smoke. Athletes who do endurance exercise. People who have a family history of atrial fibrillation. Men. People who use drugs. People who drink a lot of alcohol. People who have lung conditions, such as emphysema, pneumonia, or COPD. People who have obstructive sleep apnea. What are the signs or symptoms? Symptoms of this condition include: A feeling that your heart is racing or beating irregularly. Discomfort or pain in your chest. Shortness of breath. Sudden light-headedness or weakness. Tiring easily during exercise or activity. Fatigue. Syncope (fainting). Sweating. In some cases, there are no symptoms. How is this diagnosed? Your health care provider may detect atrial fibrillation when taking your pulse. If detected, this condition may be diagnosed with: An electrocardiogram (ECG) to check electrical signals of the  heart. An ambulatory cardiac monitor to record your heart's activity for a few days. A transthoracic echocardiogram (TTE) to create pictures of your heart. A transesophageal echocardiogram (TEE) to create even closer pictures of your heart. A stress test to check your blood supply while you exercise. Imaging tests, such as a CT scan or chest X-ray. Blood tests. How is this treated? Treatment depends on underlying conditions and how you feel when you experience atrial fibrillation. This condition may be treated with: Medicines to prevent blood clots or to treat heart rate or heart rhythm problems. Electrical cardioversion to reset the heart's rhythm. A pacemaker to correct abnormal heart rhythm. Ablation to remove the heart tissue that sends abnormal signals. Left atrial appendage closure to seal the area where blood clots can form. In some cases, underlying conditions will be treated. Follow these instructions at home: Medicines Take over-the counter and prescription medicines only as told by your health care provider. Do not take any new medicines without talking to your health care provider. If you are taking blood thinners: Talk with your health care provider before you take any medicines that contain aspirin or NSAIDs, such as ibuprofen. These medicines increase your risk for dangerous bleeding. Take your medicine exactly as told, at the same time every day. Avoid activities that could cause injury or bruising, and follow instructions about how to prevent falls. Wear a medical alert bracelet or carry a card that lists what medicines you take. Lifestyle     Do not use any products that contain nicotine or tobacco, such as cigarettes, e-cigarettes, and chewing tobacco. If you need help quitting, ask your health care provider. Eat heart-healthy foods. Talk with a dietitian to make an eating plan that is right for you. Exercise regularly as told by   your health care provider. Do not  drink alcohol. Lose weight if you are overweight. Do not use drugs, including cannabis. General instructions If you have obstructive sleep apnea, manage your condition as told by your health care provider. Do not use diet pills unless your health care provider approves. Diet pills can make heart problems worse. Keep all follow-up visits as told by your health care provider. This is important. Contact a health care provider if you: Notice a change in the rate, rhythm, or strength of your heartbeat. Are taking a blood thinner and you notice more bruising. Tire more easily when you exercise or do heavy work. Have a sudden change in weight. Get help right away if you have:  Chest pain, abdominal pain, sweating, or weakness. Trouble breathing. Side effects of blood thinners, such as blood in your vomit, stool, or urine, or bleeding that cannot stop. Any symptoms of a stroke. "BE FAST" is an easy way to remember the main warning signs of a stroke: B - Balance. Signs are dizziness, sudden trouble walking, or loss of balance. E - Eyes. Signs are trouble seeing or a sudden change in vision. F - Face. Signs are sudden weakness or numbness of the face, or the face or eyelid drooping on one side. A - Arms. Signs are weakness or numbness in an arm. This happens suddenly and usually on one side of the body. S - Speech. Signs are sudden trouble speaking, slurred speech, or trouble understanding what people say. T - Time. Time to call emergency services. Write down what time symptoms started. Other signs of a stroke, such as: A sudden, severe headache with no known cause. Nausea or vomiting. Seizure. These symptoms may represent a serious problem that is an emergency. Do not wait to see if the symptoms will go away. Get medical help right away. Call your local emergency services (911 in the [place] Do not drive yourself to the hospital. Summary Atrial fibrillation is a type of irregular or rapid  heartbeat (arrhythmia). Symptoms include a feeling that your heart is beating fast or irregularly. You may be given medicines to prevent blood clots or to treat heart rate or heart rhythm problems. Get help right away if you have signs or symptoms of a stroke. Get help right away if you cannot catch your breath or have chest pain or pressure. This information is not intended to replace advice given to you by your health care provider. Make sure you discuss any questions you have with your healthcare provider. Document Revised: 01/23/2019 Document Reviewed: 01/23/2019 Elsevier Patient Education  2022 [employer] Inc.  

## 2021-01-26 NOTE — Progress Notes (Signed)
Subjective:  Patient ID: [name] [name], female    DOB: 12-28-1933  Age: 87 y.o. MRN: [id number]  CC: New Patient (Initial Visit), Hypertension, Hyperlipidemia, and Atrial Fibrillation  This visit occurred during the SARS-CoV-2 public health emergency.  Safety protocols were in place, including screening questions prior to the visit, additional usage of staff PPE, and extensive cleaning of exam room while observing appropriate contact time as indicated for disinfecting solutions.    HPI [name] [name] presents for f/up and to establish.  She complains of chronic, unchanged shortness of breath and PND.  She has recently had some weight gain.  She does not complain of lower extremity edema.  She denies palpitations, dizziness, lightheadedness, or chest pain.  History [name] [name] has a past medical history of Anginal pain ([place]), Anxiety, Arthritis, Breast cancer ([place]), Bronchitis, CAD (coronary artery disease), Chest pressure (03/13/2019), Depression, Diastolic heart failure, Frequent urination at night, GERD (gastroesophageal reflux disease), Heart murmur, Hypercholesterolemia, Hypertension, Hypothyroidism, Mental disorder, Sleep disturbance, Type II diabetes mellitus ([place]), and Urinary incontinence.   She has a past surgical history that includes Adenosine Myoview; Ulnar nerve repair; Plantar fascia release (1990's); Colonoscopy; Pars plana vitrectomy (04/03/2012); Tonsillectomy and adenoidectomy (1972); Appendectomy (1954); Cholecystectomy (~ 2004); Abdominal hysterectomy (1964); Dilation and curettage of uterus (1960's); Neuroplasty / transposition median nerve at carpal tunnel bilateral (1990's); Cataract extraction w/ intraocular lens  implant, bilateral (~ 2000); Mastectomy (2001); Breast biopsy (2001); Cardiac catheterization; Coronary angioplasty; Coronary angioplasty with stent (2000's); Pars plana vitrectomy (04/03/2012); 25 gauge pars plana vitrectomy with 20 gauge mvr port (Right, 06/04/2013);  Membrane peel (Right, 06/04/2013); Gas/fluid exchange (Right, 06/04/2013); Eye surgery; Back surgery (06/2015); Total knee arthroplasty (Left, 10/13/2015); Total knee arthroplasty (Right, 09/20/2016); LEFT HEART CATH AND CORONARY ANGIOGRAPHY (N/A, 03/14/2019); and Cardioversion (N/A, 03/15/2019).   Her family history includes Alcoholism in her father; Cancer in her brother and mother.She reports that she has never smoked. She has never used smokeless tobacco. She reports that she does not drink alcohol and does not use drugs.  Outpatient Medications Prior to Visit  Medication Sig Dispense Refill   diltiazem (CARDIZEM CD) 180 MG 24 hr capsule Take 1 capsule (180 mg total) by mouth daily. 90 capsule 3   furosemide (LASIX) 40 MG tablet Take 1 tablet (40 mg total) by mouth daily. 30 tablet 3   hydrochlorothiazide (MICROZIDE) 12.5 MG capsule Take 12.5 mg by mouth daily.     isosorbide mononitrate (IMDUR) 120 MG 24 hr tablet Take 120 mg by mouth Daily.      levothyroxine (SYNTHROID) 75 MCG tablet Take 75 mcg by mouth daily.     losartan (COZAAR) 100 MG tablet Take 100 mg by mouth daily.     apixaban (ELIQUIS) 5 MG TABS tablet Take 1 tablet (5 mg total) by mouth 2 (two) times daily. 180 tablet 3   atorvastatin (LIPITOR) 40 MG tablet Take 1 tablet (40 mg total) by mouth daily. 90 tablet 3   traZODone (DESYREL) 150 MG tablet Take 225 mg by mouth at bedtime. Taking 1 & 1/2 tablet at bedtime     venlafaxine XR (EFFEXOR-XR) 150 MG 24 hr capsule Take 150 mg by mouth 2 (two) times a day.      acetaminophen (TYLENOL) 650 MG CR tablet Take 1,300 mg by mouth every 8 (eight) hours as needed for pain. (Patient not taking: Reported on 01/26/2021)     Biotin 5000 MCG CAPS Take 5,000 mcg by mouth daily. (Patient not taking: Reported on 01/26/2021)  metoprolol succinate (TOPROL-XL) 50 MG 24 hr tablet Take 1 tablet (50 mg total) by mouth daily. Take with or immediately following a meal. (Patient not taking: No sig reported)  30 tablet 0   spironolactone (ALDACTONE) 25 MG tablet Take 1 tablet (25 mg total) by mouth daily. (Patient not taking: Reported on 01/26/2021) 30 tablet 3   No facility-administered medications prior to visit.    ROS Review of Systems  Constitutional:  Positive for unexpected weight change. Negative for appetite change, diaphoresis and fatigue.  HENT: Negative.    Eyes: Negative.   Respiratory:  Positive for shortness of breath. Negative for cough, chest tightness and wheezing.   Cardiovascular:  Negative for chest pain, palpitations and leg swelling.  Gastrointestinal:  Negative for abdominal pain, constipation, diarrhea, nausea and vomiting.  Endocrine: Negative.  Negative for polyuria.  Genitourinary: Negative.  Negative for decreased urine volume, difficulty urinating and dysuria.  Musculoskeletal:  Negative for arthralgias and myalgias.  Skin: Negative.   Neurological: Negative.  Negative for dizziness, weakness, light-headedness and numbness.  Hematological:  Negative for adenopathy. Does not bruise/bleed easily.  Psychiatric/Behavioral:  Positive for dysphoric mood and sleep disturbance. Negative for decreased concentration and suicidal ideas. The patient is nervous/anxious.        She would like to continue taking the current dose of trazodone and Effexor.   Objective:  BP 132/68 (BP Location: Left Arm, Patient Position: Sitting, Cuff Size: Large)   Pulse 99   Temp 98.4 F (36.9 C) (Oral)   Resp 16   Ht 5\' 2"  (1.575 m)   Wt 195 lb 6.4 oz (88.6 kg)   SpO2 97%   BMI 35.74 kg/m   Physical Exam Vitals reviewed.  Constitutional:      Appearance: She is obese.  HENT:     Nose: Nose normal.     Mouth/Throat:     Mouth: Mucous membranes are moist.  Eyes:     General: No scleral icterus.    Conjunctiva/sclera: Conjunctivae normal.  Cardiovascular:     Rate and Rhythm: Normal rate. Rhythm irregularly irregular.     Heart sounds: Murmur heard.  Systolic murmur is present  with a grade of 2/6.    No gallop.  Pulmonary:     Breath sounds: No stridor. No wheezing, rhonchi or rales.  Abdominal:     General: Abdomen is protuberant. Bowel sounds are normal. There is no distension.     Palpations: Abdomen is soft. There is no hepatomegaly, splenomegaly or mass.     Tenderness: There is no abdominal tenderness. There is no guarding.  Musculoskeletal:        General: Normal range of motion.     Cervical back: Neck supple.     Right lower leg: No edema.     Left lower leg: No edema.  Lymphadenopathy:     Cervical: No cervical adenopathy.  Skin:    General: Skin is warm and dry.  Neurological:     General: No focal deficit present.     Mental Status: She is alert.  Psychiatric:        Mood and Affect: Mood normal.    Lab Results  Component Value Date   WBC 8.9 01/26/2021   HGB 14.3 01/26/2021   HCT 41.7 01/26/2021   PLT 297.0 01/26/2021   GLUCOSE 110 (H) 01/26/2021   CHOL 156 01/26/2021   TRIG 220.0 (H) 01/26/2021   HDL 53.30 01/26/2021   LDLDIRECT 87.0 01/26/2021   LDLCALC 127 (H)  02/12/2020   ALT 23 06/25/2020   AST 26 06/25/2020   NA 140 01/26/2021   K 3.8 01/26/2021   CL 99 01/26/2021   CREATININE 0.98 01/26/2021   BUN 15 01/26/2021   CO2 32 01/26/2021   TSH 3.49 01/26/2021   INR 0.96 09/13/2016   HGBA1C 6.6 (H) 01/26/2021     Assessment & Plan:   [name] [name] was seen today for new patient (initial visit), hypertension, hyperlipidemia and atrial fibrillation.  Diagnoses and all orders for this visit:  Depression with anxiety -     traZODone (DESYREL) 150 MG tablet; Take 1.5 tablets (225 mg total) by mouth at bedtime. Taking 1 & 1/2 tablet at bedtime -     venlafaxine XR (EFFEXOR-XR) 150 MG 24 hr capsule; Take 1 capsule (150 mg total) by mouth daily with breakfast.  Dyslipidemia, goal LDL below 70- LDL goal achieved. Doing well on the statin  -     atorvastatin (LIPITOR) 40 MG tablet; Take 1 tablet (40 mg total) by mouth daily. -      Lipid panel; Future -     Lipid panel  Permanent atrial fibrillation (HCC)-she has good rate control.  Will continue anticoagulation with the DOAC. -     apixaban (ELIQUIS) 5 MG TABS tablet; Take 1 tablet (5 mg total) by mouth 2 (two) times daily.  Acquired hypothyroidism- Her TSH is in the normal range.  She will stay on the current dose of levothyroxine. -     TSH; Future -     TSH  Chronic hyperglycemia- She has developed DM2. -     Hemoglobin A1c; Future -     Hemoglobin A1c  Essential hypertension- Her BP is well controlled. -     CBC with Differential/Platelet; Future -     Basic metabolic panel; Future -     Magnesium; Future -     Magnesium -     Basic metabolic panel -     CBC with Differential/Platelet  Chronic diastolic HF (heart failure) ([place])- She has a normal volume status.  Need for prophylactic vaccination with combined diphtheria-tetanus-pertussis (DTP) vaccine -     Tdap (BOOSTRIX) 5-2.5-18.5 LF-MCG/0.5 injection; Inject 0.5 mLs into the muscle once for 1 dose.  Need for shingles vaccine -     Zoster Vaccine Adjuvanted [facility]) injection; Inject 0.5 mLs into the muscle once for 1 dose.  Need for vaccination -     Pneumococcal polysaccharide vaccine 23-valent greater than or equal to 2yo subcutaneous/IM  Type 2 diabetes mellitus with stage 2 chronic kidney disease, without long-term current use of insulin (HCC) -     dapagliflozin propanediol (FARXIGA) 10 MG TABS tablet; Take 1 tablet (10 mg total) by mouth daily before breakfast.  Stage 3b chronic kidney disease ([place])- I recommended that she start [place] for renal protection. -     dapagliflozin propanediol (FARXIGA) 10 MG TABS tablet; Take 1 tablet (10 mg total) by mouth daily before breakfast.  Other orders -     LDL cholesterol, direct  I have discontinued [name] [name] "Faye"'s metoprolol succinate, spironolactone, and Biotin. I have also changed her traZODone and venlafaxine XR. Additionally, I am  having her start on Shingrix, Boostrix, and dapagliflozin propanediol. Lastly, I am having her maintain her isosorbide mononitrate, levothyroxine, diltiazem, furosemide, hydrochlorothiazide, acetaminophen, losartan, apixaban, and atorvastatin.  Meds ordered this encounter  Medications   traZODone (DESYREL) 150 MG tablet    Sig: Take 1.5 tablets (225 mg total) by mouth  at bedtime. Taking 1 & 1/2 tablet at bedtime    Dispense:  135 tablet    Refill:  1   venlafaxine XR (EFFEXOR-XR) 150 MG 24 hr capsule    Sig: Take 1 capsule (150 mg total) by mouth daily with breakfast.    Dispense:  90 capsule    Refill:  1   apixaban (ELIQUIS) 5 MG TABS tablet    Sig: Take 1 tablet (5 mg total) by mouth 2 (two) times daily.    Dispense:  180 tablet    Refill:  1   atorvastatin (LIPITOR) 40 MG tablet    Sig: Take 1 tablet (40 mg total) by mouth daily.    Dispense:  90 tablet    Refill:  1   Zoster Vaccine Adjuvanted [facility]) injection    Sig: Inject 0.5 mLs into the muscle once for 1 dose.    Dispense:  0.5 mL    Refill:  1   Tdap (BOOSTRIX) 5-2.5-18.5 LF-MCG/0.5 injection    Sig: Inject 0.5 mLs into the muscle once for 1 dose.    Dispense:  0.5 mL    Refill:  0   dapagliflozin propanediol (FARXIGA) 10 MG TABS tablet    Sig: Take 1 tablet (10 mg total) by mouth daily before breakfast.    Dispense:  90 tablet    Refill:  1     Follow-up: Return in about 6 months (around 07/28/2021).  Scarlette Calico, MD

## 2021-01-27 ENCOUNTER — Encounter: Payer: Self-pay | Admitting: Internal Medicine

## 2021-01-27 MED ORDER — DAPAGLIFLOZIN PROPANEDIOL 10 MG PO TABS: 10.0000 mg | ORAL_TABLET | Freq: Every day | ORAL | 1 refills | Status: DC

## 2021-03-23 ENCOUNTER — Other Ambulatory Visit: Payer: Self-pay | Admitting: Internal Medicine

## 2021-03-23 ENCOUNTER — Telehealth: Payer: Self-pay | Admitting: Internal Medicine

## 2021-03-23 DIAGNOSIS — I4821 Permanent atrial fibrillation: Secondary | ICD-10-CM

## 2021-03-23 MED ORDER — APIXABAN 5 MG PO TABS: 5.0000 mg | ORAL_TABLET | Freq: Two times a day (BID) | ORAL | 1 refills | Status: DC

## 2021-03-23 NOTE — Telephone Encounter (Signed)
1.Medication Requested: apixaban (ELIQUIS) 5 MG TABS tablet  2. Pharmacy (Name, Street, [place]):  [place], [place] [employer] [place] Phone:  [phone]  Fax:  [phone]      3. On Med List: yes  4. Last Visit with PCP: 06.14.22  5. Next visit date with PCP: 12.14.22   Agent: Please be advised that RX refills may take up to 3 business days. We ask that you follow-up with your pharmacy.

## 2021-06-23 ENCOUNTER — Ambulatory Visit (INDEPENDENT_AMBULATORY_CARE_PROVIDER_SITE_OTHER): Payer: Medicare Other

## 2021-06-23 ENCOUNTER — Ambulatory Visit (INDEPENDENT_AMBULATORY_CARE_PROVIDER_SITE_OTHER): Payer: Medicare Other | Admitting: Sports Medicine

## 2021-06-23 ENCOUNTER — Other Ambulatory Visit: Payer: Self-pay

## 2021-06-23 VITALS — BP 160/100 | HR 106 | Ht 62.0 in

## 2021-06-23 DIAGNOSIS — M545 Low back pain, unspecified: Secondary | ICD-10-CM

## 2021-06-23 NOTE — Patient Instructions (Signed)
Good to see you  Tylenol 500mg  2 tablets 2 times daily Follow up in 2 weeks

## 2021-06-23 NOTE — Progress Notes (Signed)
[name] [name] [name] [name] Phone: [phone]   Assessment and Plan:     1. Acute bilateral low back pain, without sciatica present -Acute, initial sports medicine visit - Acute low back pain after a fall - X-ray obtained in clinic due to patient age, comorbidities, mechanism of injury.  My interpretation: Hardware in place from L4-L5 fusion.  No significant listhesis.  Decreased height appearance of L1 on lateral view, however this may be due to angulation of x-ray as there is no drastic change or pathology seen on AP.  Will await for radiology interpretation - Encouraged to use Rollator, 4 prongs cane when ambulatory - Discontinue NSAID use due to comorbidities as well as patient being on chronic Eliquis - Start Tylenol 500 mg 2 tablets daily for pain relief - Educated on red flag signs to be wary of that would warrant a phone call to our office for transportation to ER - DG Lumbar Spine 2-3 Views; Future    Pertinent previous records reviewed include lumbar MRI and lumbar x-ray   Follow Up: 2 weeks for reevaluation to ensure no worsening of pain.    Subjective:   I, [name] [name], am serving as a scribe for Dr. Glennon [name]  Chief Complaint: back pain   HPI:   06/23/21 Patient is a 87 year old female presenting with back pain after a fall she had on Sunday. Patient is unsure how she fell but her grand daughter states she thinks she tripped over her own feet. Patient fell on left side and arm, wrist, and painter finger is sore and bruised. Locates pain to low back on left side, patient states last night she woke up and could hardly move from the pain.   Numbness/tingling:no Aggravates: walking  Treatments tried: ibuprofen    Relevant Historical Information: back surgery 2016  Additional pertinent review of systems negative.   Current Outpatient Medications:    apixaban (ELIQUIS) 5 MG TABS  tablet, Take 1 tablet (5 mg total) by mouth 2 (two) times daily., Disp: 180 tablet, Rfl: 1   atorvastatin (LIPITOR) 40 MG tablet, Take 1 tablet (40 mg total) by mouth daily., Disp: 90 tablet, Rfl: 1   dapagliflozin propanediol (FARXIGA) 10 MG TABS tablet, Take 1 tablet (10 mg total) by mouth daily before breakfast., Disp: 90 tablet, Rfl: 1   diltiazem (CARDIZEM CD) 180 MG 24 hr capsule, Take 1 capsule (180 mg total) by mouth daily., Disp: 90 capsule, Rfl: 3   hydrochlorothiazide (MICROZIDE) 12.5 MG capsule, Take 12.5 mg by mouth daily., Disp: , Rfl:    isosorbide mononitrate (IMDUR) 120 MG 24 hr tablet, Take 120 mg by mouth Daily. , Disp: , Rfl:    levothyroxine (SYNTHROID) 75 MCG tablet, Take 75 mcg by mouth daily., Disp: , Rfl:    losartan (COZAAR) 100 MG tablet, Take 100 mg by mouth daily., Disp: , Rfl:    traZODone (DESYREL) 150 MG tablet, Take 1.5 tablets (225 mg total) by mouth at bedtime. Taking 1 & 1/2 tablet at bedtime, Disp: 135 tablet, Rfl: 1   venlafaxine XR (EFFEXOR-XR) 150 MG 24 hr capsule, Take 1 capsule (150 mg total) by mouth daily with breakfast., Disp: 90 capsule, Rfl: 1   acetaminophen (TYLENOL) 650 MG CR tablet, Take 1,300 mg by mouth every 8 (eight) hours as needed for pain. (Patient not taking: No sig reported), Disp: , Rfl:    furosemide (LASIX) 40 MG tablet, Take 1  tablet (40 mg total) by mouth daily., Disp: 30 tablet, Rfl: 3   Objective:     Vitals:   06/23/21 1427  BP: (!) 160/100  Pulse: (!) 106  SpO2: 96%  Height: 5\' 2"  (1.575 m)      Body mass index is 35.74 kg/m.    Physical Exam:    Gen: Appears well, nad, nontoxic and pleasant Psych: Alert and oriented, appropriate mood and affect Neuro: sensation intact, strength is 5/5 in upper and lower extremities, muscle tone wnl Skin: no susupicious lesions or rashes or bruising  Back - Normal skin, Spine with normal alignment and no deformity.   No tenderness to vertebral process palpation.   Paraspinous  muscles are not tender and without spasm TTP lateral to L1-L2 on the left without bruising or muscle spasming or radiating pain Straight leg raise negative   Electronically signed by:  [name] [name] D.[name] [name] [facility] 3:13 PM 06/23/21

## 2021-07-06 NOTE — Progress Notes (Deleted)
    Benito Mccreedy D.[place] Phone: [phone]   Assessment and Plan:     There are no diagnoses linked to this encounter.  ***   Pertinent previous records reviewed include ***   Follow Up: ***     Subjective:   I, [name] [name], am serving as a scribe for Dr. Glennon Mac  Chief Complaint: Back pain follow up   HPI:   06/23/21 Patient is a 87 year old female presenting with back pain after a fall she had on Sunday. Patient is unsure how she fell but her grand daughter states she thinks she tripped over her own feet. Patient fell on left side and arm, wrist, and painter finger is sore and bruised. Locates pain to low back on left side, patient states last night she woke up and could hardly move from the pain.    Numbness/tingling:no Aggravates: walking  Treatments tried: ibuprofen   07/07/21 Patient states   Relevant Historical Information: Back surgery in 2016  Additional pertinent review of systems negative.   Current Outpatient Medications:    acetaminophen (TYLENOL) 650 MG CR tablet, Take 1,300 mg by mouth every 8 (eight) hours as needed for pain. (Patient not taking: No sig reported), Disp: , Rfl:    apixaban (ELIQUIS) 5 MG TABS tablet, Take 1 tablet (5 mg total) by mouth 2 (two) times daily., Disp: 180 tablet, Rfl: 1   atorvastatin (LIPITOR) 40 MG tablet, Take 1 tablet (40 mg total) by mouth daily., Disp: 90 tablet, Rfl: 1   dapagliflozin propanediol (FARXIGA) 10 MG TABS tablet, Take 1 tablet (10 mg total) by mouth daily before breakfast., Disp: 90 tablet, Rfl: 1   diltiazem (CARDIZEM CD) 180 MG 24 hr capsule, Take 1 capsule (180 mg total) by mouth daily., Disp: 90 capsule, Rfl: 3   furosemide (LASIX) 40 MG tablet, Take 1 tablet (40 mg total) by mouth daily., Disp: 30 tablet, Rfl: 3   hydrochlorothiazide (MICROZIDE) 12.5 MG capsule, Take 12.5 mg by mouth daily., Disp: , Rfl:     isosorbide mononitrate (IMDUR) 120 MG 24 hr tablet, Take 120 mg by mouth Daily. , Disp: , Rfl:    levothyroxine (SYNTHROID) 75 MCG tablet, Take 75 mcg by mouth daily., Disp: , Rfl:    losartan (COZAAR) 100 MG tablet, Take 100 mg by mouth daily., Disp: , Rfl:    traZODone (DESYREL) 150 MG tablet, Take 1.5 tablets (225 mg total) by mouth at bedtime. Taking 1 & 1/2 tablet at bedtime, Disp: 135 tablet, Rfl: 1   venlafaxine XR (EFFEXOR-XR) 150 MG 24 hr capsule, Take 1 capsule (150 mg total) by mouth daily with breakfast., Disp: 90 capsule, Rfl: 1   Objective:     There were no vitals filed for this visit.    There is no height or weight on file to calculate BMI.    Physical Exam:    ***   Electronically signed by:  Benito Mccreedy D.[facility] 2:32 PM 07/06/21

## 2021-07-07 ENCOUNTER — Ambulatory Visit: Payer: Medicare Other | Admitting: Sports Medicine

## 2021-07-28 ENCOUNTER — Encounter: Payer: Self-pay | Admitting: Internal Medicine

## 2021-07-28 ENCOUNTER — Ambulatory Visit (INDEPENDENT_AMBULATORY_CARE_PROVIDER_SITE_OTHER): Payer: Medicare Other | Admitting: Internal Medicine

## 2021-07-28 ENCOUNTER — Other Ambulatory Visit: Payer: Self-pay

## 2021-07-28 VITALS — BP 146/98 | HR 100 | Temp 97.6°F | Ht 62.0 in | Wt 188.0 lb

## 2021-07-28 DIAGNOSIS — Z23 Encounter for immunization: Secondary | ICD-10-CM

## 2021-07-28 DIAGNOSIS — N1832 Chronic kidney disease, stage 3b: Secondary | ICD-10-CM | POA: Diagnosis not present

## 2021-07-28 DIAGNOSIS — I10 Essential (primary) hypertension: Secondary | ICD-10-CM | POA: Diagnosis not present

## 2021-07-28 DIAGNOSIS — N182 Chronic kidney disease, stage 2 (mild): Secondary | ICD-10-CM

## 2021-07-28 DIAGNOSIS — I4821 Permanent atrial fibrillation: Secondary | ICD-10-CM | POA: Diagnosis not present

## 2021-07-28 DIAGNOSIS — R270 Ataxia, unspecified: Secondary | ICD-10-CM

## 2021-07-28 DIAGNOSIS — E1122 Type 2 diabetes mellitus with diabetic chronic kidney disease: Secondary | ICD-10-CM

## 2021-07-28 DIAGNOSIS — Z0001 Encounter for general adult medical examination with abnormal findings: Secondary | ICD-10-CM | POA: Diagnosis not present

## 2021-07-28 DIAGNOSIS — E039 Hypothyroidism, unspecified: Secondary | ICD-10-CM | POA: Diagnosis not present

## 2021-07-28 DIAGNOSIS — R296 Repeated falls: Secondary | ICD-10-CM

## 2021-07-28 MED ORDER — DILTIAZEM HCL ER COATED BEADS 180 MG PO CP24
180.0000 mg | ORAL_CAPSULE | Freq: Every day | ORAL | 1 refills | Status: DC
Start: 2021-07-28 — End: 2021-09-03

## 2021-07-28 NOTE — Progress Notes (Signed)
Subjective:  Patient ID: [name] [name], female    DOB: [birth year]  Age: 87 y.o. MRN: [id number]  CC: Atrial Fibrillation, Hypertension, Diabetes, and Hypothyroidism  This visit occurred during the SARS-CoV-2 public health emergency.  Safety protocols were in place, including screening questions prior to the visit, additional usage of staff PPE, and extensive cleaning of exam room while observing appropriate contact time as indicated for disinfecting solutions.    HPI Sharyah Bostwick Meth presents for f/up -   She sustained a fall about a month ago.  She complains of a generalized sensation of weakness and ataxia.  She is poorly conditioned because she said she sits at home by herself on her sofa all day.  She denies slurred speech, trouble swallowing, or paresthesias.  She is no longer taking the calcium channel blocker.  Outpatient Medications Prior to Visit  Medication Sig Dispense Refill   acetaminophen (TYLENOL) 650 MG CR tablet Take 1,300 mg by mouth every 8 (eight) hours as needed for pain.     apixaban (ELIQUIS) 5 MG TABS tablet Take 1 tablet (5 mg total) by mouth 2 (two) times daily. 180 tablet 1   atorvastatin (LIPITOR) 40 MG tablet Take 1 tablet (40 mg total) by mouth daily. 90 tablet 1   dapagliflozin propanediol (FARXIGA) 10 MG TABS tablet Take 1 tablet (10 mg total) by mouth daily before breakfast. 90 tablet 1   hydrochlorothiazide (MICROZIDE) 12.5 MG capsule Take 12.5 mg by mouth daily.     isosorbide mononitrate (IMDUR) 120 MG 24 hr tablet Take 120 mg by mouth Daily.      levothyroxine (SYNTHROID) 75 MCG tablet Take 75 mcg by mouth daily.     losartan (COZAAR) 100 MG tablet Take 100 mg by mouth daily.     traZODone (DESYREL) 150 MG tablet Take 1.5 tablets (225 mg total) by mouth at bedtime. Taking 1 & 1/2 tablet at bedtime 135 tablet 1   venlafaxine XR (EFFEXOR-XR) 150 MG 24 hr capsule Take 1 capsule (150 mg total) by mouth daily with breakfast. 90 capsule 1   diltiazem (CARDIZEM CD)  180 MG 24 hr capsule Take 1 capsule (180 mg total) by mouth daily. 90 capsule 3   furosemide (LASIX) 40 MG tablet Take 1 tablet (40 mg total) by mouth daily. 30 tablet 3   No facility-administered medications prior to visit.    ROS Review of Systems  Constitutional:  Negative for chills, diaphoresis, fatigue and fever.  HENT: Negative.    Eyes: Negative.  Negative for visual disturbance.  Respiratory:  Negative for cough, chest tightness, shortness of breath and wheezing.   Cardiovascular:  Negative for chest pain, palpitations and leg swelling.  Gastrointestinal:  Negative for abdominal pain, constipation, diarrhea, nausea and vomiting.  Endocrine: Negative.   Genitourinary: Negative.  Negative for difficulty urinating.  Musculoskeletal:  Positive for gait problem. Negative for arthralgias and neck pain.  Skin: Negative.  Negative for color change.  Neurological:  Positive for weakness. Negative for dizziness, syncope, light-headedness and numbness.  Hematological:  Negative for adenopathy. Does not bruise/bleed easily.  Psychiatric/Behavioral: Negative.     Objective:  BP (!) 146/98 (BP Location: Left Arm, Patient Position: Sitting, Cuff Size: Large)    Pulse 100    Temp 97.6 F (36.4 C) (Oral)    Ht 5\' 2"  (1.575 m)    Wt 188 lb (85.3 kg)    SpO2 98%    BMI 34.39 kg/m   BP Readings from Last 3 Encounters:  07/28/21 (!) 146/98  06/23/21 (!) 160/100  01/26/21 132/68    Wt Readings from Last 3 Encounters:  07/28/21 188 lb (85.3 kg)  01/26/21 195 lb 6.4 oz (88.6 kg)  06/26/20 192 lb 3.2 oz (87.2 kg)    Physical Exam Constitutional:      Appearance: She is obese. She is not ill-appearing.  HENT:     Mouth/Throat:     Mouth: Mucous membranes are moist.  Eyes:     General: No scleral icterus.    Extraocular Movements: Extraocular movements intact.     Pupils: Pupils are equal, round, and reactive to light.  Cardiovascular:     Rate and Rhythm: Tachycardia present. Rhythm  irregularly irregular.     Heart sounds: No murmur heard.    Comments: EKG- A fib with RVR, 119 bpm LAD - old No LVH or Q waves Pulmonary:     Effort: Pulmonary effort is normal.     Breath sounds: No stridor. No wheezing, rhonchi or rales.  Abdominal:     General: Abdomen is protuberant. Bowel sounds are normal. There is no distension.     Palpations: Abdomen is soft. There is no hepatomegaly, splenomegaly or mass.     Tenderness: There is no abdominal tenderness.  Musculoskeletal:        General: No swelling.     Cervical back: Neck supple.     Right lower leg: No edema.     Left lower leg: No edema.  Lymphadenopathy:     Cervical: No cervical adenopathy.  Skin:    General: Skin is warm and dry.     Coloration: Skin is not pale.  Neurological:     General: No focal deficit present.     Mental Status: She is alert.     Cranial Nerves: Cranial nerves 2-12 are intact. No cranial nerve deficit.     Sensory: Sensation is intact. No sensory deficit.     Motor: Weakness (BLE) present. No atrophy.     Coordination: Romberg sign positive. Coordination abnormal. Finger-Nose-Finger Test abnormal and Heel to [facility] Test abnormal. Impaired rapid alternating movements.     Gait: Gait abnormal.     Deep Tendon Reflexes: Reflexes normal. Babinski sign absent on the right side. Babinski sign absent on the left side.     Reflex Scores:      Tricep reflexes are 0 on the right side and 0 on the left side.      Bicep reflexes are 0 on the right side and 0 on the left side.      Brachioradialis reflexes are 0 on the right side and 0 on the left side.      Patellar reflexes are 0 on the right side and 0 on the left side.      Achilles reflexes are 0 on the right side and 0 on the left side. Psychiatric:        Mood and Affect: Mood normal.        Behavior: Behavior normal.    Lab Results  Component Value Date   WBC 8.6 07/28/2021   HGB 15.9 (H) 07/28/2021   HCT 47.7 (H) 07/28/2021   PLT  306.0 07/28/2021   GLUCOSE 60 (L) 07/28/2021   CHOL 156 01/26/2021   TRIG 220.0 (H) 01/26/2021   HDL 53.30 01/26/2021   LDLDIRECT 87.0 01/26/2021   LDLCALC 127 (H) 02/12/2020   ALT 23 06/25/2020   AST 26 06/25/2020   NA 140 07/28/2021   K  4.4 07/28/2021   CL 100 07/28/2021   CREATININE 0.85 07/28/2021   BUN 11 07/28/2021   CO2 31 07/28/2021   TSH 6.37 (H) 07/28/2021   INR 0.96 09/13/2016   HGBA1C 6.3 07/28/2021   MICROALBUR 5.9 (H) 07/28/2021    DG Chest 2 View  Result Date: 06/25/2020 CLINICAL DATA:  Fall EXAM: CHEST - 2 VIEW COMPARISON:  Her 03/13/2019 FINDINGS: Cardiomegaly. Both lungs are clear. Disc degenerative disease of the thoracic spine. IMPRESSION: Cardiomegaly without acute abnormality of the lungs. Electronically Signed   By: Eddie Candle M.D.   On: 06/25/2020 20:29   DG Elbow Complete Right  Result Date: 06/25/2020 CLINICAL DATA:  Pain status post fall EXAM: RIGHT ELBOW - COMPLETE 3+ VIEW COMPARISON:  None. FINDINGS: There is no evidence of fracture, dislocation, or joint effusion. There is no evidence of arthropathy or other focal bone abnormality. Soft tissues are unremarkable. IMPRESSION: Negative. Electronically Signed   By: Constance Holster M.D.   On: 06/25/2020 20:29   DG Hip Unilat W or Wo Pelvis 2-3 Views Right  Result Date: 06/25/2020 CLINICAL DATA:  Hip pain. EXAM: DG HIP (WITH OR WITHOUT PELVIS) 2-3V RIGHT COMPARISON:  None. FINDINGS: Mild degenerative changes are noted of both hips. Phleboliths project over the patient's pelvis. There is no acute displaced fracture or dislocation. IMPRESSION: Negative. Electronically Signed   By: Constance Holster M.D.   On: 06/25/2020 20:31    Assessment & Plan:   Lashala was seen today for atrial fibrillation, hypertension, diabetes and hypothyroidism.  Diagnoses and all orders for this visit:  Essential hypertension- Her BP is not well controlled. Will restart the CCB. -     CBC with Differential/Platelet;  Future -     Basic metabolic panel; Future -     TSH; Future -     Urinalysis, Routine w reflex microscopic; Future -     Urinalysis, Routine w reflex microscopic -     TSH -     Basic metabolic panel -     CBC with Differential/Platelet  Permanent atrial fibrillation ([place])- Her HR is too high. Will restart the CCB. -     TSH; Future -     diltiazem (CARDIZEM CD) 180 MG 24 hr capsule; Take 1 capsule (180 mg total) by mouth daily. -     TSH -     AMB Referral to [place]  Acquired hypothyroidism- Her TSH is in the normal range. -     TSH; Future -     TSH  Type 2 diabetes mellitus with stage 2 chronic kidney disease, without long-term current use of insulin ([place])- Her blood sugar is well controlled. -     Microalbumin / creatinine urine ratio; Future -     Hemoglobin A1c; Future -     Hemoglobin A1c -     Microalbumin / creatinine urine ratio  Stage 3b chronic kidney disease ([place])- Her renal function is stable. -     Microalbumin / creatinine urine ratio; Future -     Urinalysis, Routine w reflex microscopic; Future -     Urinalysis, Routine w reflex microscopic -     Microalbumin / creatinine urine ratio  Ataxia- Will get an MRI to see if she has had a CVA or has developed atrophy or NPH. -     MR Brain Wo Contrast; Future -     Ambulatory referral to [place] -     AMB Referral to [facility]  Coordinaton  Need for immunization against influenza -     Flu Vaccine QUAD High Dose(Fluad)  Frequent falls -     Ambulatory referral to [place] -     AMB Referral to [place]  I am having Adrian Prows. Pecor "[name]" maintain her isosorbide mononitrate, levothyroxine, furosemide, hydrochlorothiazide, acetaminophen, losartan, traZODone, venlafaxine XR, atorvastatin, dapagliflozin propanediol, apixaban, and diltiazem.  Meds ordered this encounter  Medications   diltiazem (CARDIZEM CD) 180 MG 24 hr capsule    Sig: Take 1 capsule (180 mg total)  by mouth daily.    Dispense:  90 capsule    Refill:  1      Follow-up: Return in about 3 months (around 10/26/2021).  Scarlette Calico, MD

## 2021-07-28 NOTE — Patient Instructions (Signed)
Atrial Fibrillation  Atrial fibrillation is a type of irregular or rapid heartbeat (arrhythmia). In atrial fibrillation, the top part of the heart (atria) beats in an irregular pattern. This makes the heart unable to pump bloodnormally and effectively. The goal of treatment is to prevent blood clots from forming, control your heart rate, or restore your heartbeat to a normal rhythm. If this condition is not treated, it can cause serious problems, such as a weakened heart muscle (cardiomyopathy) or a stroke. What are the causes? This condition is often caused by medical conditions that damage the heart's electrical system. These include: High blood pressure (hypertension). This is the most common cause. Certain heart problems or conditions, such as heart failure, coronary artery disease, heart valve problems, or heart surgery. Diabetes. Overactive thyroid (hyperthyroidism). Obesity. Chronic kidney disease. In some cases, the cause of this condition is not known. What increases the risk? This condition is more likely to develop in: Older people. People who smoke. Athletes who do endurance exercise. People who have a family history of atrial fibrillation. Men. People who use drugs. People who drink a lot of alcohol. People who have lung conditions, such as emphysema, pneumonia, or COPD. People who have obstructive sleep apnea. What are the signs or symptoms? Symptoms of this condition include: A feeling that your heart is racing or beating irregularly. Discomfort or pain in your chest. Shortness of breath. Sudden light-headedness or weakness. Tiring easily during exercise or activity. Fatigue. Syncope (fainting). Sweating. In some cases, there are no symptoms. How is this diagnosed? Your health care provider may detect atrial fibrillation when taking your pulse. If detected, this condition may be diagnosed with: An electrocardiogram (ECG) to check electrical signals of the  heart. An ambulatory cardiac monitor to record your heart's activity for a few days. A transthoracic echocardiogram (TTE) to create pictures of your heart. A transesophageal echocardiogram (TEE) to create even closer pictures of your heart. A stress test to check your blood supply while you exercise. Imaging tests, such as a CT scan or chest X-ray. Blood tests. How is this treated? Treatment depends on underlying conditions and how you feel when you experience atrial fibrillation. This condition may be treated with: Medicines to prevent blood clots or to treat heart rate or heart rhythm problems. Electrical cardioversion to reset the heart's rhythm. A pacemaker to correct abnormal heart rhythm. Ablation to remove the heart tissue that sends abnormal signals. Left atrial appendage closure to seal the area where blood clots can form. In some cases, underlying conditions will be treated. Follow these instructions at home: Medicines Take over-the counter and prescription medicines only as told by your health care provider. Do not take any new medicines without talking to your health care provider. If you are taking blood thinners: Talk with your health care provider before you take any medicines that contain aspirin or NSAIDs, such as ibuprofen. These medicines increase your risk for dangerous bleeding. Take your medicine exactly as told, at the same time every day. Avoid activities that could cause injury or bruising, and follow instructions about how to prevent falls. Wear a medical alert bracelet or carry a card that lists what medicines you take. Lifestyle     Do not use any products that contain nicotine or tobacco, such as cigarettes, e-cigarettes, and chewing tobacco. If you need help quitting, ask your health care provider. Eat heart-healthy foods. Talk with a dietitian to make an eating plan that is right for you. Exercise regularly as told by   your health care provider. Do not  drink alcohol. Lose weight if you are overweight. Do not use drugs, including cannabis. General instructions If you have obstructive sleep apnea, manage your condition as told by your health care provider. Do not use diet pills unless your health care provider approves. Diet pills can make heart problems worse. Keep all follow-up visits as told by your health care provider. This is important. Contact a health care provider if you: Notice a change in the rate, rhythm, or strength of your heartbeat. Are taking a blood thinner and you notice more bruising. Tire more easily when you exercise or do heavy work. Have a sudden change in weight. Get help right away if you have:  Chest pain, abdominal pain, sweating, or weakness. Trouble breathing. Side effects of blood thinners, such as blood in your vomit, stool, or urine, or bleeding that cannot stop. Any symptoms of a stroke. "BE FAST" is an easy way to remember the main warning signs of a stroke: B - Balance. Signs are dizziness, sudden trouble walking, or loss of balance. E - Eyes. Signs are trouble seeing or a sudden change in vision. F - Face. Signs are sudden weakness or numbness of the face, or the face or eyelid drooping on one side. A - Arms. Signs are weakness or numbness in an arm. This happens suddenly and usually on one side of the body. S - Speech. Signs are sudden trouble speaking, slurred speech, or trouble understanding what people say. T - Time. Time to call emergency services. Write down what time symptoms started. Other signs of a stroke, such as: A sudden, severe headache with no known cause. Nausea or vomiting. Seizure. These symptoms may represent a serious problem that is an emergency. Do not wait to see if the symptoms will go away. Get medical help right away. Call your local emergency services (911 in the [place] Do not drive yourself to the hospital. Summary Atrial fibrillation is a type of irregular or rapid  heartbeat (arrhythmia). Symptoms include a feeling that your heart is beating fast or irregularly. You may be given medicines to prevent blood clots or to treat heart rate or heart rhythm problems. Get help right away if you have signs or symptoms of a stroke. Get help right away if you cannot catch your breath or have chest pain or pressure. This information is not intended to replace advice given to you by your health care provider. Make sure you discuss any questions you have with your healthcare provider. Document Revised: 01/23/2019 Document Reviewed: 01/23/2019 Elsevier Patient Education  2022 [employer] Inc.  

## 2021-07-29 LAB — URINALYSIS, ROUTINE W REFLEX MICROSCOPIC
Bilirubin Urine: NEGATIVE
Ketones, ur: NEGATIVE
Leukocytes,Ua: NEGATIVE
Nitrite: NEGATIVE
Specific Gravity, Urine: 1.025 (ref 1.000–1.030)
Total Protein, Urine: NEGATIVE
Urine Glucose: NEGATIVE
Urobilinogen, UA: 0.2 (ref 0.0–1.0)
pH: 6 (ref 5.0–8.0)

## 2021-07-29 LAB — MICROALBUMIN / CREATININE URINE RATIO
Creatinine,U: 99.6 mg/dL
Microalb Creat Ratio: 5.9 mg/g (ref 0.0–30.0)
Microalb, Ur: 5.9 mg/dL — ABNORMAL HIGH (ref 0.0–1.9)

## 2021-07-29 LAB — CBC WITH DIFFERENTIAL/PLATELET
Basophils Absolute: 0.1 10*3/uL (ref 0.0–0.1)
Basophils Relative: 0.8 % (ref 0.0–3.0)
Eosinophils Absolute: 0.1 10*3/uL (ref 0.0–0.7)
Eosinophils Relative: 1.5 % (ref 0.0–5.0)
HCT: 47.7 % — ABNORMAL HIGH (ref 36.0–46.0)
Hemoglobin: 15.9 g/dL — ABNORMAL HIGH (ref 12.0–15.0)
Lymphocytes Relative: 30.4 % (ref 12.0–46.0)
Lymphs Abs: 2.6 10*3/uL (ref 0.7–4.0)
MCHC: 33.3 g/dL (ref 30.0–36.0)
MCV: 93.8 fl (ref 78.0–100.0)
Monocytes Absolute: 0.7 10*3/uL (ref 0.1–1.0)
Monocytes Relative: 7.6 % (ref 3.0–12.0)
Neutro Abs: 5.1 10*3/uL (ref 1.4–7.7)
Neutrophils Relative %: 59.7 % (ref 43.0–77.0)
Platelets: 306 10*3/uL (ref 150.0–400.0)
RBC: 5.09 Mil/uL (ref 3.87–5.11)
RDW: 14.2 % (ref 11.5–15.5)
WBC: 8.6 10*3/uL (ref 4.0–10.5)

## 2021-07-29 LAB — BASIC METABOLIC PANEL
BUN: 11 mg/dL (ref 6–23)
CO2: 31 mEq/L (ref 19–32)
Calcium: 10 mg/dL (ref 8.4–10.5)
Chloride: 100 mEq/L (ref 96–112)
Creatinine, Ser: 0.85 mg/dL (ref 0.40–1.20)
GFR: 62.29 mL/min (ref >60.00)
Glucose, Bld: 60 mg/dL — ABNORMAL LOW (ref 70–99)
Potassium: 4.4 mEq/L (ref 3.5–5.1)
Sodium: 140 mEq/L (ref 135–145)

## 2021-07-29 LAB — HEMOGLOBIN A1C: Hgb A1c MFr Bld: 6.3 % (ref 4.6–6.5)

## 2021-07-29 LAB — TSH: TSH: 6.37 u[IU]/mL — ABNORMAL HIGH (ref 0.35–5.50)

## 2021-07-30 ENCOUNTER — Encounter: Payer: Self-pay | Admitting: Internal Medicine

## 2021-07-30 ENCOUNTER — Telehealth: Payer: Self-pay | Admitting: *Deleted

## 2021-07-30 DIAGNOSIS — Z0001 Encounter for general adult medical examination with abnormal findings: Secondary | ICD-10-CM | POA: Insufficient documentation

## 2021-07-30 NOTE — Chronic Care Management (AMB) (Signed)
Chronic Care Management  ° °Note ° °07/30/2021 °Name: [name] [name] MRN: [id number] DOB: 09/20/1933 ° °[name] [name] is a 87 y.o. year old female who is a primary care patient of Jones, Thomas L, MD. I reached out to [name] [name] by phone today in response to a referral sent by [name] [name]'s PCP. ° °[name] [name] was given information about Chronic Care Management services today including:  °CCM service includes personalized support from designated clinical staff supervised by her physician, including individualized plan of care and coordination with other care providers °24/7 contact phone numbers for assistance for urgent and routine care needs. °Service will only be billed when office clinical staff spend 20 minutes or more in a month to coordinate care. °Only one practitioner may furnish and bill the service in a calendar month. °The patient may stop CCM services at any time (effective at the end of the month) by phone call to the office staff. °The patient is responsible for co-pay (up to 20% after annual deductible is met) if co-pay is required by the individual health plan.  ° °Daughter [name] [name] DPR on file  verbally agreed to assistance and services provided by embedded care coordination/care management team today. ° °Follow up plan: °Telephone appointment with care management team member scheduled for: 08/17/21 with SW and 08/19/21 with RNCM  ° °[name] [name]  °Care Guide, Embedded Care Coordination °[place]   Care Management  °Direct Dial: [phone] ° °

## 2021-07-30 NOTE — Assessment & Plan Note (Signed)
Exam completed Labs reviewed Vaccines reviewed and updated No cancer screenings indicated  Pt ed material was given

## 2021-08-17 ENCOUNTER — Ambulatory Visit (INDEPENDENT_AMBULATORY_CARE_PROVIDER_SITE_OTHER): Payer: Medicare Other | Admitting: *Deleted

## 2021-08-17 DIAGNOSIS — E039 Hypothyroidism, unspecified: Secondary | ICD-10-CM

## 2021-08-17 NOTE — Chronic Care Management (AMB) (Signed)
Chronic Care Management    Clinical Social Work Note  08/17/2021 Name: [name] [name] MRN: [id number] DOB: [birth year]  [name] [name] is a 88 y.o. year old female who is a primary care patient of [name], MD. The CCM team was consulted to assist the patient with chronic disease management and/or care coordination needs related to: [employer] .   Engaged with patient by telephone for initial visit in response to provider referral for social work chronic care management and care coordination services.   Consent to Services:  The patient was given information about Chronic Care Management services, agreed to services, and gave verbal consent prior to initiation of services.  Please see initial visit note for detailed documentation.   Patient agreed to services and consent obtained.   Assessment: Review of patient past medical history, allergies, medications, and health status, including review of relevant consultants reports was performed today as part of a comprehensive evaluation and provision of chronic care management and care coordination services.     SDOH (Social Determinants of Health) assessments and interventions performed:  SDOH Interventions    Flowsheet Row Most Recent Value  SDOH Interventions   Housing Interventions Intervention Not Indicated  Transportation Interventions Intervention Not Indicated        Advanced Directives Status: See Care Plan for related entries.  CCM Care Plan  Allergies  Allergen Reactions   No Known Allergies     Outpatient Encounter Medications as of 08/17/2021  Medication Sig   acetaminophen (TYLENOL) 650 MG CR tablet Take 1,300 mg by mouth every 8 (eight) hours as needed for pain.   apixaban (ELIQUIS) 5 MG TABS tablet Take 1 tablet (5 mg total) by mouth 2 (two) times daily.   atorvastatin (LIPITOR) 40 MG tablet Take 1 tablet (40 mg total) by mouth daily.   dapagliflozin propanediol (FARXIGA) 10 MG TABS tablet Take 1 tablet (10  mg total) by mouth daily before breakfast.   diltiazem (CARDIZEM CD) 180 MG 24 hr capsule Take 1 capsule (180 mg total) by mouth daily.   furosemide (LASIX) 40 MG tablet Take 1 tablet (40 mg total) by mouth daily.   hydrochlorothiazide (MICROZIDE) 12.5 MG capsule Take 12.5 mg by mouth daily.   isosorbide mononitrate (IMDUR) 120 MG 24 hr tablet Take 120 mg by mouth Daily.    levothyroxine (SYNTHROID) 75 MCG tablet Take 75 mcg by mouth daily.   losartan (COZAAR) 100 MG tablet Take 100 mg by mouth daily.   traZODone (DESYREL) 150 MG tablet Take 1.5 tablets (225 mg total) by mouth at bedtime. Taking 1 & 1/2 tablet at bedtime   venlafaxine XR (EFFEXOR-XR) 150 MG 24 hr capsule Take 1 capsule (150 mg total) by mouth daily with breakfast.   No facility-administered encounter medications on file as of 08/17/2021.    Patient Active Problem List   Diagnosis Date Noted   Encounter for general adult medical examination with abnormal findings 07/30/2021   Ataxia 07/28/2021   Frequent falls 07/28/2021   Permanent atrial fibrillation ([place]) 01/26/2021   Stage 3b chronic kidney disease ([place]) 01/26/2021   Type II diabetes mellitus ([place])    Depression with anxiety 06/25/2020   Chronic anticoagulation 11/08/2019   Sleep apnea 11/08/2019   PAF (paroxysmal atrial fibrillation) ([place]) 12/20/2018   Primary localized osteoarthritis of right knee 09/20/2016   Primary osteoarthritis of right knee 09/20/2016   Primary osteoarthritis of left knee 10/13/2015   Radiculopathy 07/08/2015   Preretinal fibrosis, 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  Chronic diastolic HF (heart failure) ([place]) 07/03/2009   Aortic valve disorder 06/17/2009   Hypothyroidism 07/28/2008   Dyslipidemia, goal LDL below 70 07/28/2008   Essential hypertension 07/28/2008    Conditions to be addressed/monitored: DMII and Osteoarthritis; Limited social support, Limited access to caregiver, and Lacks knowledge of community resource: (PCS)  Care Plan : LCSW Plan  of Care  Updates made by Deirdre Peer, LCSW since 08/17/2021 12:00 AM     Problem: Social and Functional Symptoms      Long-Range Goal: Social and Functional Skills Optimized   Start Date: 08/17/2021  Expected End Date: 11/12/2021  This Visit's Progress: On track  Priority: High  Note:   Current Barriers:   Patient unable to consistently perform activities of daily living and needs assistance in order to meet this unmet need Clinical Goals: Over the next 45 to 60 days, patient will have personal care needs met as evident by having PCS Aide in the home assisting with needs.  Clinical Interventions :  Initial call to pt and daughter, HCPOA-[name] [name], today.  Pt has had some falls at home- she is home alone a few days a week while family is working and in school.  Noted PCP to order [facility] (PT) at last office visit- they have not heard from anyone yet- will alert PCP office.  Pt has a life alert button and is also on Medicaid so may be eligible for some in home PCS/care.  Discussed process with pt and daughter for application process and will have Care Guide mail this to them.   Assessed needs, level of care concerns, basic eligibility and provided education on Personal Care Service process,  Advised daughter to look for PCS paperwork/application to be mailed to them- the patient portion to be completed and then delivered to PCP office for PCP portion completion and faxing to [employer] at [phone] LCSW will collaborate with [facility] to verify application is received and processed.  Solution-Focused Strategies employed:   Active listening / Reflection utilized  Emotional Support Provided Caregiver stress acknowledged  1:1 collaboration with primary care provider regarding development and update of comprehensive plan of care as evidenced by provider attestation and co-signature Inter-disciplinary care team collaboration (see longitudinal plan of care) Patient  Goals/Self-Care Activities: Over the next 30 days Review, complete and deliver to PCP office the PCS/personal care service application Return calls from [facility] or call them directly if you have questions [phone] or [phone]                 Follow Up Plan: Appointment scheduled for SW follow up with client by phone on: 09/07/21      Eduard Clos MSW, [place] Licensed Clinical Social Worker [place] [phone]

## 2021-08-17 NOTE — Patient Instructions (Signed)
Visit Information   Thank you for taking time to visit with me today. Please don't hesitate to contact me if I can be of assistance to you before our next scheduled telephone appointment.  Following are the goals we discussed today:  (Copy and paste patient goals from clinical care plan here)  Our next appointment is by telephone on 09/07/21 at 1PM  Please call the care guide team at [phone] if you need to cancel or reschedule your appointment.   If you are experiencing a Mental Health or [place] or need someone to talk to, please call the Suicide and Crisis Lifeline: 988 call 911   Following is a copy of your full care plan:  Care Plan : LCSW Plan of Care  Updates made by Deirdre Peer, LCSW since 08/17/2021 12:00 AM     Problem: Social and Functional Symptoms      Long-Range Goal: Social and Functional Skills Optimized   Start Date: 08/17/2021  Expected End Date: 11/12/2021  This Visit's Progress: On track  Priority: High  Note:   Current Barriers:   Patient unable to consistently perform activities of daily living and needs assistance in order to meet this unmet need Clinical Goals: Over the next 45 to 60 days, patient will have personal care needs met as evident by having PCS Aide in the home assisting with needs.  Clinical Interventions :  Initial call to pt and daughter, HCPOA-[name], today.  Pt has had some falls at home- she is home alone a few days a week while family is working and in school.  Noted PCP to order [facility] (PT) at last office visit- they have not heard from anyone yet- will alert PCP office.  Pt has a life alert button and is also on Medicaid so may be eligible for some in home PCS/care.  Discussed process with pt and daughter for application process and will have Care Guide mail this to them.   Assessed needs, level of care concerns, basic eligibility and provided education on Personal Care Service process,  Advised daughter to look for  PCS paperwork/application to be mailed to them- the patient portion to be completed and then delivered to PCP office for PCP portion completion and faxing to [employer] at [phone] LCSW will collaborate with [facility] to verify application is received and processed.  Solution-Focused Strategies employed:   Active listening / Reflection utilized  Emotional Support Provided Caregiver stress acknowledged  1:1 collaboration with primary care provider regarding development and update of comprehensive plan of care as evidenced by provider attestation and co-signature Inter-disciplinary care team collaboration (see longitudinal plan of care) Patient Goals/Self-Care Activities: Over the next 30 days Review, complete and deliver to PCP office the PCS/personal care service application Return calls from [facility] or call them directly if you have questions [phone] or [phone]                Consent to CCM Services: Ms. Felmlee was given information about Chronic Care Management services including:  CCM service includes personalized support from designated clinical staff supervised by her physician, including individualized plan of care and coordination with other care providers 24/7 contact phone numbers for assistance for urgent and routine care needs. Service will only be billed when office clinical staff spend 20 minutes or more in a month to coordinate care. Only one practitioner may furnish and bill the service in a calendar month. The patient may stop CCM services at  any time (effective at the end of the month) by phone call to the office staff. The patient will be responsible for cost sharing (co-pay) of up to 20% of the service fee (after annual deductible is met).  Patient agreed to services and verbal consent obtained.   The patient verbalized understanding of instructions, educational materials, and care plan provided today and  declined offer to receive copy of patient instructions, educational materials, and care plan.   Telephone follow up appointment with care management team member scheduled for:

## 2021-08-18 ENCOUNTER — Other Ambulatory Visit: Payer: Self-pay | Admitting: *Deleted

## 2021-08-18 DIAGNOSIS — E1122 Type 2 diabetes mellitus with diabetic chronic kidney disease: Secondary | ICD-10-CM

## 2021-08-18 DIAGNOSIS — N182 Chronic kidney disease, stage 2 (mild): Secondary | ICD-10-CM

## 2021-08-19 ENCOUNTER — Ambulatory Visit: Payer: Medicare Other | Admitting: *Deleted

## 2021-08-19 DIAGNOSIS — I10 Essential (primary) hypertension: Secondary | ICD-10-CM

## 2021-08-19 DIAGNOSIS — I4821 Permanent atrial fibrillation: Secondary | ICD-10-CM

## 2021-08-19 DIAGNOSIS — R296 Repeated falls: Secondary | ICD-10-CM

## 2021-08-19 DIAGNOSIS — F418 Other specified anxiety disorders: Secondary | ICD-10-CM

## 2021-08-19 NOTE — Patient Instructions (Addendum)
Visit Information   [name] [name] and [name], thank you for taking time to talk with me today. Please don't hesitate to contact me if I can be of assistance to you before our next scheduled telephone appointment.  Below are the goals we discussed today:  Patient Self-Care Activities: Patient [name] [name] will: Take medications as prescribed- please read over the enclosed educational material about the importance of taking medications as prescribed, as well as things you can do to help to remember to take your medications Attend all scheduled provider appointments Call pharmacy for medication refills Call provider office for new concerns or questions Talk to your doctor and to the Clinical Social Worker about your ongoing depression Continue to try to follow heart healthy, low salt, low cholesterol, carbohydrate-modified, low sugar diet Review enclosed educational material    Our next scheduled telephone follow up visit/ appointment with care management team member is scheduled on:  Monday, September 27, 2021 at 11:45 am  If you need to cancel or re-schedule our visit, please call [phone] and our care guide team will be happy to assist you.   I look forward to hearing about your progress.   [name], RN, BSN, [place] [phone]: direct office  If you are experiencing a Mental Health or [place] or need someone to talk to, please  call the Suicide and Crisis Lifeline: 988 call the [place] National Suicide Prevention Lifeline: (629)791-6821 or TTY: (680)805-8803 TTY (737)735-2767) to talk to a trained counselor call 1-800-273-TALK (toll free, 24 hour hotline) go to [facility] [address] [place] [phone])   Understanding Your Risk for Falls Each year, millions of people have serious injuries from falls. It is important to understand your risk for falling. Talk with  your health care provider about your risk and what you can do to lower it. There are actions you can take at home to lower your risk and prevent falls. If you do have a serious fall, make sure to tell your health care provider. Falling once raises your risk of falling again. How can falls affect me? Serious injuries from falls are common. These include: Broken bones, such as hip fractures. Head injuries, such as traumatic brain injuries (TBI) or concussion. A fear of falling can cause you to avoid activities and stay at home. This can make your muscles weaker and actually raise your risk for a fall. What can increase my risk? There are a number of risk factors that increase your risk for falling. The more risk factors you have, the higher your risk of falling. Serious injuries from a fall happen most often to people older than age 3. Children and young adults ages 32-29 are also at higher risk. Common risk factors include: Weakness in the lower body. Lack (deficiency) of vitamin D. Being generally weak or confused due to long-term (chronic) illness. Dizziness or balance problems. Poor vision. Medicines that cause dizziness or drowsiness. These can include medicines for your blood pressure, heart, anxiety, insomnia, or edema, as well as pain medicines and muscle relaxants. Other risk factors include: Drinking alcohol. Having had a fall in the past. Having depression. Having foot pain or wearing improper footwear. Working at a dangerous job. Having any of the following in your home: Tripping hazards, such as floor clutter or loose rugs. Poor lighting. Pets. Having dementia or memory loss. What actions can I take to lower my risk of falling?  Physical activity Maintain physical fitness. Do strength and balance exercises. Consider taking a regular class to build strength and balance. Yoga and tai chi are good options. Vision Have your eyes checked every year and your vision prescription  updated as needed. Walking aids and footwear Wear nonskid shoes. Do not wear high heels. Do not walk around the house in socks or slippers. Use a cane or walker as told by your health care provider. Home safety Attach secure railings on both sides of your stairs. Install grab bars for your tub, shower, and toilet. Use a bath mat in your tub or shower. Use good lighting in all rooms. Keep a flashlight near your bed. Make sure there is a clear path from your bed to the bathroom. Use night-lights. Do not use throw rugs. Make sure all carpeting is taped or tacked down securely. Remove all clutter from walkways and stairways, including extension cords. Repair uneven or broken steps. Avoid walking on icy or slippery surfaces. Walk on the grass instead of on icy or slick sidewalks. Use ice melt to get rid of ice on walkways. Use a cordless phone. Questions to ask your health care provider Can you help me check my risk for a fall? Do any of my medicines make me more likely to fall? Should I take a vitamin D supplement? What exercises can I do to improve my strength and balance? Should I make an appointment to have my vision checked? Do I need a bone density test to check for weak bones or osteoporosis? Would it help to use a cane or a walker? Where to find more information Centers for Disease Control and Prevention, [employer]: http://www.wolf.info/ Community-Based Fall Prevention Programs: http://www.wolf.info/ National Institute on Aging: http://kim-miller.com/ Contact a health care provider if: You fall at home. You are afraid of falling at home. You feel weak, drowsy, or dizzy. Summary Serious injuries from a fall happen most often to people older than age 34. Children and young adults ages 50-29 are also at higher risk. Talk with your health care provider about your risks for falling and how to lower those risks. Taking certain precautions at home can lower your risk for falling. If you fall, always tell your  health care provider. This information is not intended to replace advice given to you by your health care provider. Make sure you discuss any questions you have with your health care provider. Document Revised: 03/04/2020 Document Reviewed: 03/04/2020 Elsevier Patient Education  2022 [place] of Medicine Management Taking your medicines correctly is an important part of managing or preventing medical problems. Make sure you know what disease or condition your medicine is treating, and how and when to take it. If you do not take your medicine correctly, it may not work well and may cause unpleasant side effects, including serious health problems. What should I do when I am taking medicines?  Read all the labels and inserts that come with your medicines. Review the information often and with each refill. Talk with your pharmacist if you get a refill and notice a change in the size, color, or shape of your medicines. Know the potential side effects for each medicine that you take. Try to get all your medicines from the same pharmacy. The pharmacist will have all your information and will understand how your medicines will affect each other (interact). Always carry an updated list of your medicines with you. If there is an emergency, a first responder can quickly see what medicines  you are taking. Tell your health care provider about all your medicines, including over-the-counter medicines, vitamins, and herbal or dietary supplements. Your health care provider will make sure that nothing will interact with any of your prescribed medicines. How can I take my medicines safely? Take medicines only as told by your health care provider. Do not take more of your medicine than instructed. Do not take anyone else's medicines. Do not share your medicines with others. Do not stop taking your medicines unless your health care provider tells you to do so. You may need to avoid alcohol or certain  foods or liquids when taking certain medicines. Follow your health care provider's instructions. Do not split, cut, crush, or chew your medicines unless your health care provider tells you to do so. Tell your health care provider if you have trouble swallowing your medicines. For liquid medicine, use the dosing container that was provided. Household spoons are not accurate. How should I organize my medicines? Know your medicines Know what each of your medicines looks like. This includes size, color, and shape. Tell your health care provider if you are having trouble recognizing all the medicines that you are taking. If you cannot tell your medicines apart because they look similar, keep them in the original bottles. If you cannot read the labels on the bottles, tell your pharmacist to put your medicines in containers with large print. Review your medicines and your schedule with family members, a friend, or a caregiver. Use a pill organizer Use a tool to organize your medicine schedule. Tools include a weekly pillbox, a written chart, a notebook, or a calendar. Your tool should help you remember the following things about each medicine: The name of the medicine. The amount (dose) to take. The schedule. This is the day and time the medicine should be taken. The appearance. This includes color, shape, size, and stamp. How to take your medicines. This includes instructions to take them with food, without food, with fluids, or with other medicines. Create reminders for taking your medicines. Use sticky notes, or use alarms on your watch, mobile device, or phone calendar. You may choose to use a more advanced management system. These systems have storage, alarms, and visual and audio prompts. Some medicines can be taken on an "as-needed" basis. These may include medicines for nausea, constipation, pain, cough and cold, allergies, and anxiety. If you take an as-needed medicine, write down the name and  dose, as well as the date and time that you took it. How should I plan for travel? Take your pillbox, medicines, and organization system with you when traveling. Have your medicines refilled before you travel. This will ensure that you do not run out of your medicines while you are away from home. Always carry an updated list of your medicines with you. If there is an emergency, a first responder can quickly see what medicines you are taking. Do not pack your medicines in checked luggage in case your luggage is lost or delayed. Keep your medicines in your carry-on bag. If any of your medicines is considered a controlled substance, make sure you bring a letter from your health care provider with you. How should I store and discard my medicines? For safe storage: Store medicines in a cool, dry area away from light, or as directed by your health care provider. Do not store medicines in the bathroom. Heat and humidity will affect them. Do not store your medicines with other chemicals or with medicines for pets  or other household members. Keep medicines away from children and pets. Do not leave them on counters or bedside tables. Store them in high cabinets or on high shelves. For safe disposal: Check expiration dates regularly. Do not take expired medicines. Discard medicines that are older than the expiration date. Learn a safe way to dispose of your medicines. You may: Use a local government, hospital, or pharmacy medicine-take-back program. If you cannot return the medicine, check the label or package insert to see if the medicine should be thrown out in the garbage or flushed down the toilet. If you are not sure, ask your health care team. If it is safe to put the medicine in the trash, empty the medicine out of the container. Mix the medicine with cat litter, dirt, coffee grounds, or another unwanted substance. Seal the mixture in a bag or container. Put it in the trash. What should I  remember? Tell your health care provider if you: Experience side effects. Have new symptoms. Feel that your medicine is no longer working. Have other concerns about taking your medicines. Review your medicines regularly with your health care provider. Other medicines, diet, medical conditions, weight changes, and daily habits can all affect how medicines work. Ask if you need to continue taking each medicine, and discuss how well each one is working. Refill your medicines early to avoid running out of them. In case of an accidental overdose, call your local poison control center at 1-(930)488-4532 or go to your local emergency department right away. Summary Taking your medicines correctly is an important part of managing or preventing medical problems. You need to make sure that you understand what you are taking a medicine for, as well as how and when you need to take it. Use a tool to organize your medicine schedule. Tools include a weekly pillbox, a written chart, a notebook, or a calendar. In case of an accidental overdose, call your local [place] at [phone] or go to your local emergency department right away. This information is not intended to replace advice given to you by your health care provider. Make sure you discuss any questions you have with your health care provider. Document Revised: 03/09/2021 Document Reviewed: 03/09/2021 Elsevier Patient Education  [place].  Management of Memory Problems  There are some general things you can do to help manage your memory problems.  Your memory may not in fact recover, but by using techniques and strategies you will be able to manage your memory difficulties better.  1)  Establish a routine. Try to establish and then stick to a regular routine.  By doing this, you will get used to what to expect and you will reduce the need to rely on your memory.  Also, try to do things at the same time of day, such as taking  your medication or checking your calendar first thing in the morning. Think about think that you can do as a part of a regular routine and make a list.  Then enter them into a daily planner to remind you.  This will help you establish a routine.  2)  Organize your environment. Organize your environment so that it is uncluttered.  Decrease visual stimulation.  Place everyday items such as keys or cell phone in the same place every day (ie.  Basket next to front door) Use post it notes with a brief message to yourself (ie. Turn off light, lock the door) Use labels to indicate where things go (  ie. Which cupboards are for food, dishes, etc.) Keep a notepad and pen by the telephone to take messages  3)  Memory Aids A diary or journal/notebook/daily planner Making a list (shopping list, chore list, to do list that needs to be done) Using an alarm as a reminder (kitchen timer or cell phone alarm) Using cell phone to store information (Notes, Calendar, Reminders) Calendar/White board placed in a prominent position Post-it notes  In order for memory aids to be useful, you need to have good habits.  It's no good remembering to make a note in your journal if you don't remember to look in it.  Try setting aside a certain time of day to look in journal.  4)  Improving mood and managing fatigue. There may be other factors that contribute to memory difficulties.  Factors, such as anxiety, depression and tiredness can affect memory. Regular gentle exercise can help improve your mood and give you more energy. Simple relaxation techniques may help relieve symptoms of anxiety Try to get back to completing activities or hobbies you enjoyed doing in the past. Learn to pace yourself through activities to decrease fatigue. Find out about some local support groups where you can share experiences with others. Try and achieve 7-8 hours of sleep at night.   Following is a copy of your full care plan:  Care Plan :  [place] of Care  Updates made by [name], RN since 08/20/2021 12:00 AM     Problem: Chronic Disease Management Needs   Priority: High     Long-Range Goal: Development of plan of care for long term chronic disease management   Start Date: 08/19/2021  Expected End Date: 08/19/2022  Priority: High  Note:   Current Barriers:  Chronic Disease Management support and education needs related to Atrial Fibrillation, HTN, and frequent falls Non-adherence to prescribed medication regimen Ongoing depression Multiple falls- patient/ caregiver report 6-plus falls, without serious injury, over last 12 months: patient uses cane/ walker as needed Memory issues reported by caregiver/ patient Need for in-home care assistance: CCM CSW team active; working to obtain PCS through patient's medicaid benefits  RNCM Clinical Goal(s):  Patient will demonstrate improved health management independence as evidenced by adherence to plan of care for medication regimen, AF/ HTN, fall prevention        through collaboration with Consulting civil engineer, provider, and care team.   Interventions: 1:1 collaboration with primary care provider regarding development and update of comprehensive plan of care as evidenced by provider attestation and co-signature Inter-disciplinary care team collaboration (see longitudinal plan of care) Evaluation of current treatment plan related to  self management and patient's adherence to plan as established by provider Initial assessment completed 08/19/21 Medications discussed: patient/ caregiver report patient does not consistently take morning medications; daughter/ caregiver prepares medications in 7-day pill boxes, one for morning, one for evening; caregiver works during daytime hours and often returns home from work to discover that patient has not taken morning medications; patient states, "I just get tired of swallowing so many pills all the time;" discussed purpose of  medications, as well as patient's consistently high blood pressures; discussed value of medication adherence in setting of A-F/ HTN; patient states she "will try to do better;" she declines need for CCM pharmacy referral, "for now," stating she would like to try herself to improve adherence; we discussed strategies that might help patient in "remembering" medications, although the primary barrier appears to be that patient just  does not "like" having to take "so many medications;" caregiver reports that in general, patient misses "2 or 3 days a week" of morning medications; caregiver agrees to write days patient misses taking morning medications down on her calendar for our future discussion/ care planning Depression screening completed: patient reports ongoing depression/ sadness- she is currently on antidepressant medications, but as noted above, misses weekly doses of this medication; discussed mechanism of action for antidepressant medications, and provided rationale around why it is important to take every day, to maintain medication levels in her body; patient denies SI today-- will make CCM CSW and PCP aware of patient's ongoing report of depressive symptoms; encouraged caregiver and patient to discuss with both CSW team (already active) and with PCP; patient admits that she has been very worried about her adult son who is scheduled to have "heart surgery for a valve replacement" on August 31, 2021  Falls:  (Status: New goal.) Long Term Goal  Provided written and verbal education re: potential causes of falls and Fall prevention strategies Reviewed medications and discussed potential side effects of medications such as dizziness and frequent urination Advised patient to discuss ongoing inconsistent adherence to prescribed medication regimen with provider Screening for signs and symptoms of depression related to chronic disease state Assessed social determinant of health barriers Confirmed patient  has ramp at home off back entry; caregiver reports floor vents in the mobile home they live in, which has caused patient to fall in past- patient is avoiding these areas in the floor when she is at home, this is helping some; considering options for flooring solutions Confirms patient uses walker/ cane consistently Fall risks/ complications and prevention education provided and discussed- will provide printed education  Hypertension: (Status: New goal.) Long Term Goal  Last practice recorded BP readings:  BP Readings from Last 3 Encounters:  07/28/21 (!) 146/98  06/23/21 (!) 160/100  01/26/21 132/68  Most recent eGFR/CrCl: No results found for: EGFR  No components found for: CRCL  Evaluation of current treatment plan related to hypertension self management and patient's adherence to plan as established by provider;   Discussed plans with patient for ongoing care management follow up and provided patient with direct contact information for care management team; Discussed complications of poorly controlled blood pressure such as heart disease, stroke, circulatory complications, vision complications, kidney impairment, sexual dysfunction;  Confirmed patient does not monitor blood pressures at home: this was encouraged; advised to periodically monitor if possible; considering obtaining blood rpessure cuff; however, they will also consider monitoring when out in community, to start Discussed benefits of following heart healthy, low salt, low cholesterol diet Importance of medication adherence reinforced/ reiterated throughout call  Patient Goals/Self-Care Activities: As evidenced by review of EHR, collaboration with care team, and patient reporting during CCM RN CM outreach, Patient [name] [name] will: Take medications as prescribed- please read over the enclosed educational material about the importance of taking medications as prescribed, as well as things you can do to help to remember to take your  medications Attend all scheduled provider appointments Call pharmacy for medication refills Call provider office for new concerns or questions Talk to your doctor and to the Clinical Social Worker about your ongoing depression Continue to try to follow heart healthy, low salt, low cholesterol, carbohydrate-modified, low sugar diet Review enclosed educational material       Consent to CCM Services: Ms. Miu was given information about Chronic Care Management services 07/30/21 including:  CCM service includes personalized support from  designated clinical staff supervised by her physician, including individualized plan of care and coordination with other care providers 24/7 contact phone numbers for assistance for urgent and routine care needs. Service will only be billed when office clinical staff spend 20 minutes or more in a month to coordinate care. Only one practitioner may furnish and bill the service in a calendar month. The patient may stop CCM services at any time (effective at the end of the month) by phone call to the office staff. The patient will be responsible for cost sharing (co-pay) of up to 20% of the service fee (after annual deductible is met).  Patient agreed to services and verbal consent obtained 07/30/21.   The patient verbalized understanding of instructions, educational materials, and care plan provided today and agreed to receive a mailed copy of patient instructions, educational materials, and care plan Telephone follow up appointment with care management team member scheduled for:  Monday, September 27, 2021 at 11:45 am The patient has been provided with contact information for the care management team and has been advised to call with any health related questions or concerns

## 2021-08-20 ENCOUNTER — Telehealth: Payer: Self-pay | Admitting: *Deleted

## 2021-08-20 NOTE — Chronic Care Management (AMB) (Signed)
Chronic Care Management   CCM RN Visit Note  08/20/2021 Name: [name] [name] MRN: [id number] DOB: 1934/03/31  Subjective: [name] [name] is a 88 y.o. year old female who is a primary care patient of [name], MD. The care management team was consulted for assistance with disease management and care coordination needs.    Engaged with patient and her daughter/ caregiver [name] [name], on [place], by telephone for initial visit in response to provider referral for case management and/or care coordination services.   Consent to Services:  The patient was given information about Chronic Care Management services, agreed to services, and gave verbal consent 07/30/21 prior to initiation of services.  Please see initial visit note for detailed documentation.  Patient agreed to services and verbal consent obtained.   Assessment: Review of patient past medical history, allergies, medications, health status, including review of consultants reports, laboratory and other test data, was performed as part of comprehensive evaluation and provision of chronic care management services.   SDOH (Social Determinants of Health) assessments and interventions performed:  SDOH Interventions    Flowsheet Row Most Recent Value  SDOH Interventions   Food Insecurity Interventions Intervention Not Indicated  [denies food insecurity,  does not get food stamps]  Housing Interventions Intervention Not Indicated  [Single family mobile home,  lives with adult daughter x 3.5 years,  one level,  has ramp off back steps]  Transportation Interventions Intervention Not Indicated  [Family provides transportation]  Depression Interventions/Treatment  Medication  [CCM CSW/ PCP notified,  reports "fighting depression all my life"-- reports inconsistent adherence to prescribed medications]       CCM Care Plan Allergies  Allergen Reactions   No Known Allergies    Outpatient Encounter Medications as of 08/19/2021  Medication Sig Note    acetaminophen (TYLENOL) 650 MG CR tablet Take 1,300 mg by mouth every 8 (eight) hours as needed for pain.    apixaban (ELIQUIS) 5 MG TABS tablet Take 1 tablet (5 mg total) by mouth 2 (two) times daily.    atorvastatin (LIPITOR) 40 MG tablet Take 1 tablet (40 mg total) by mouth daily.    dapagliflozin propanediol (FARXIGA) 10 MG TABS tablet Take 1 tablet (10 mg total) by mouth daily before breakfast.    diltiazem (CARDIZEM CD) 180 MG 24 hr capsule Take 1 capsule (180 mg total) by mouth daily.    furosemide (LASIX) 40 MG tablet Take 1 tablet (40 mg total) by mouth daily.    hydrochlorothiazide (MICROZIDE) 12.5 MG capsule Take 12.5 mg by mouth daily.    isosorbide mononitrate (IMDUR) 120 MG 24 hr tablet Take 120 mg by mouth Daily.     levothyroxine (SYNTHROID) 75 MCG tablet Take 75 mcg by mouth daily.    losartan (COZAAR) 100 MG tablet Take 100 mg by mouth daily.    traZODone (DESYREL) 150 MG tablet Take 1.5 tablets (225 mg total) by mouth at bedtime. Taking 1 & 1/2 tablet at bedtime 08/19/2021: 08/19/21:Daughter reports taking one half of one tablet qHS   venlafaxine XR (EFFEXOR-XR) 150 MG 24 hr capsule Take 1 capsule (150 mg total) by mouth daily with breakfast.    No facility-administered encounter medications on file as of 08/19/2021.   Patient Active Problem List   Diagnosis Date Noted   Encounter for general adult medical examination with abnormal findings 07/30/2021   Ataxia 07/28/2021   Frequent falls 07/28/2021   Permanent atrial fibrillation ([place]) 01/26/2021   Stage 3b chronic kidney disease ([place])  01/26/2021   Type II diabetes mellitus ([place])    Depression with anxiety 06/25/2020   Chronic anticoagulation 11/08/2019   Sleep apnea 11/08/2019   PAF (paroxysmal atrial fibrillation) ([place]) 12/20/2018   Primary localized osteoarthritis of right knee 09/20/2016   Primary osteoarthritis of right knee 09/20/2016   Primary osteoarthritis of left knee 10/13/2015   Radiculopathy 07/08/2015    Preretinal fibrosis, 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   Chronic diastolic HF (heart failure) ([place]) 07/03/2009   Aortic valve disorder 06/17/2009   Hypothyroidism 07/28/2008   Dyslipidemia, goal LDL below 70 07/28/2008   Essential hypertension 07/28/2008   Conditions to be addressed/monitored:  Atrial Fibrillation and HTN; depression, medication non-adherence  Care Plan : RN Care Manager Plan of Care  Updates made by [name], RN since 08/20/2021 12:00 AM     Problem: Chronic Disease Management Needs   Priority: High     Long-Range Goal: Development of plan of care for long term chronic disease management   Start Date: 08/19/2021  Expected End Date: 08/19/2022  Priority: High  Note:   Current Barriers:  Chronic Disease Management support and education needs related to Atrial Fibrillation, HTN, and frequent falls Non-adherence to prescribed medication regimen Ongoing depression Multiple falls- patient/ caregiver report 6-plus falls, without serious injury, over last 12 months: patient uses cane/ walker as needed Memory issues reported by caregiver/ patient Need for in-home care assistance: CCM CSW team active; working to obtain PCS through patient's medicaid benefits  RNCM Clinical Goal(s):  Patient will demonstrate improved health management independence as evidenced by adherence to plan of care for medication regimen, AF/ HTN, fall prevention        through collaboration with Consulting civil engineer, provider, and care team.   Interventions: 1:1 collaboration with primary care provider regarding development and update of comprehensive plan of care as evidenced by provider attestation and co-signature Inter-disciplinary care team collaboration (see longitudinal plan of care) Evaluation of current treatment plan related to  self management and patient's adherence to plan as established by provider Initial assessment completed 08/19/21 Medications discussed: patient/ caregiver report patient does not  consistently take morning medications; daughter/ caregiver prepares medications in 7-day pill boxes, one for morning, one for evening; caregiver works during daytime hours and often returns home from work to discover that patient has not taken morning medications; patient states, "I just get tired of swallowing so many pills all the time;" discussed purpose of medications, as well as patient's consistently high blood pressures; discussed value of medication adherence in setting of A-F/ HTN; patient states she "will try to do better;" she declines need for CCM pharmacy referral, "for now," stating she would like to try herself to improve adherence; we discussed strategies that might help patient in "remembering" medications, although the primary barrier appears to be that patient just does not "like" having to take "so many medications;" caregiver reports that in general, patient misses "2 or 3 days a week" of morning medications; caregiver agrees to write days patient misses taking morning medications down on her calendar for our future discussion/ care planning Depression screening completed: patient reports ongoing depression/ sadness- she is currently on antidepressant medications, but as noted above, misses weekly doses of this medication; discussed mechanism of action for antidepressant medications, and provided rationale around why it is important to take every day, to maintain medication levels in her body; patient denies SI today-- will make CCM CSW and PCP aware of patient's ongoing report of depressive symptoms; encouraged caregiver and  patient to discuss with both CSW team (already active) and with PCP; patient admits that she has been very worried about her adult son who is scheduled to have "heart surgery for a valve replacement" on August 31, 2021  Falls:  (Status: New goal.) Long Term Goal  Provided written and verbal education re: potential causes of falls and Fall prevention  strategies Reviewed medications and discussed potential side effects of medications such as dizziness and frequent urination Advised patient to discuss ongoing inconsistent adherence to prescribed medication regimen with provider Screening for signs and symptoms of depression related to chronic disease state Assessed social determinant of health barriers Confirmed patient has ramp at home off back entry; caregiver reports floor vents in the mobile home they live in, which has caused patient to fall in past- patient is avoiding these areas in the floor when she is at home, this is helping some; considering options for flooring solutions Confirms patient uses walker/ cane consistently Fall risks/ complications and prevention education provided and discussed- will provide printed education  Hypertension: (Status: New goal.) Long Term Goal  Last practice recorded BP readings:  BP Readings from Last 3 Encounters:  07/28/21 (!) 146/98  06/23/21 (!) 160/100  01/26/21 132/68  Most recent eGFR/CrCl: No results found for: EGFR  No components found for: CRCL  Evaluation of current treatment plan related to hypertension self management and patient's adherence to plan as established by provider;   Discussed plans with patient for ongoing care management follow up and provided patient with direct contact information for care management team; Discussed complications of poorly controlled blood pressure such as heart disease, stroke, circulatory complications, vision complications, kidney impairment, sexual dysfunction;  Confirmed patient does not monitor blood pressures at home: this was encouraged; advised to periodically monitor if possible; considering obtaining blood rpessure cuff; however, they will also consider monitoring when out in community, to start Discussed benefits of following heart healthy, low salt, low cholesterol diet Importance of medication adherence reinforced/ reiterated throughout  call  Patient Goals/Self-Care Activities: As evidenced by review of EHR, collaboration with care team, and patient reporting during CCM RN CM outreach, Patient [name] [name] will: Take medications as prescribed- please read over the enclosed educational material about the importance of taking medications as prescribed, as well as things you can do to help to remember to take your medications Attend all scheduled provider appointments Call pharmacy for medication refills Call provider office for new concerns or questions Talk to your doctor and to the Clinical Social Worker about your ongoing depression Continue to try to follow heart healthy, low salt, low cholesterol, carbohydrate-modified, low sugar diet Review enclosed educational material       Plan: Telephone follow up appointment with care management team member scheduled for:  Monday September 27, 2021 at 11:45 am The patient has been provided with contact information for the care management team and has been advised to call with any health related questions or concerns  [name], RN, BSN, [place] [phone]: direct office

## 2021-08-20 NOTE — Telephone Encounter (Signed)
° °  Telephone encounter was:  Successful.  08/20/2021 Name: [name] [name] MRN: [id number] DOB: 1933/09/10  [name] [name] is a 88 y.o. year old female who is a primary care patient of [name], MD . The community resource team was consulted for assistance with Talked with patient and daughter about [place] [facility] services and [facility] transportaion as well as [employer] services and will mail requested information and also provided CG contact information  Care guide performed the following interventions: Patient provided with information about care guide support team and interviewed to confirm resource needs Follow up call placed to community resources to determine status of patients referral.  Follow Up Plan:  No further follow up planned at this time. The patient has been provided with needed resources.  [place], Care Management  [phone] , [place] [address] Email : [email or web address]

## 2021-09-03 ENCOUNTER — Other Ambulatory Visit: Payer: Self-pay

## 2021-09-03 ENCOUNTER — Telehealth: Payer: Self-pay | Admitting: Cardiovascular Disease

## 2021-09-03 DIAGNOSIS — I4821 Permanent atrial fibrillation: Secondary | ICD-10-CM

## 2021-09-03 DIAGNOSIS — E785 Hyperlipidemia, unspecified: Secondary | ICD-10-CM

## 2021-09-03 MED ORDER — DILTIAZEM HCL ER COATED BEADS 180 MG PO CP24: 180.0000 mg | ORAL_CAPSULE | Freq: Every day | ORAL | 1 refills | Status: DC

## 2021-09-03 MED ORDER — APIXABAN 5 MG PO TABS: 5.0000 mg | ORAL_TABLET | Freq: Two times a day (BID) | ORAL | 0 refills | Status: DC

## 2021-09-03 MED ORDER — ATORVASTATIN CALCIUM 40 MG PO TABS: 40.0000 mg | ORAL_TABLET | Freq: Every day | ORAL | 1 refills | Status: DC

## 2021-09-03 NOTE — Telephone Encounter (Signed)
Refills sent to pharmacy. 

## 2021-09-03 NOTE — Telephone Encounter (Signed)
°*  STAT* If patient is at the pharmacy, call can be transferred to refill team.   1. Which medications need to be refilled? (please list name of each medication and dose if known)  apixaban (ELIQUIS) 5 MG TABS tablet atorvastatin (LIPITOR) 40 MG tablet diltiazem (CARDIZEM CD) 180 MG 24 hr capsule  2. Which pharmacy/location (including street and city if local pharmacy) is medication to be sent to? [facility] - [place], [place]  3. Do they need a 30 day or 90 day supply? 90 with refills

## 2021-09-03 NOTE — Telephone Encounter (Signed)
Prescription refill request for Eliquis received. Indication:Afib Last office visit:needs appointment Scr:0.8 Age: 88 Weight:85.3 kg  Prescription refilled

## 2021-09-06 ENCOUNTER — Encounter: Payer: Self-pay | Admitting: Internal Medicine

## 2021-09-06 ENCOUNTER — Ambulatory Visit
Admission: RE | Admit: 2021-09-06 | Discharge: 2021-09-06 | Disposition: A | Discharge status: Home / Self Care | Payer: Medicare Other | Source: Ambulatory Visit | Attending: Internal Medicine | Admitting: Internal Medicine

## 2021-09-06 ENCOUNTER — Other Ambulatory Visit: Payer: Self-pay

## 2021-09-06 DIAGNOSIS — R270 Ataxia, unspecified: Secondary | ICD-10-CM

## 2021-09-07 ENCOUNTER — Ambulatory Visit: Payer: Medicaid Other | Admitting: Licensed Clinical Social Worker

## 2021-09-07 DIAGNOSIS — F418 Other specified anxiety disorders: Secondary | ICD-10-CM

## 2021-09-07 DIAGNOSIS — N1832 Chronic kidney disease, stage 3b: Secondary | ICD-10-CM

## 2021-09-07 NOTE — Chronic Care Management (AMB) (Signed)
Chronic Care Management    Clinical Social Work Note  09/07/2021 Name: [name] [name] MRN: [id number] DOB: 28-Oct-1933  [name] [name] is a 88 y.o. year old female who is a primary care patient of [name], MD. The CCM team was consulted to assist the patient with chronic disease management and/or care coordination needs related to: Level of Care Concerns and Caregiver Stress.   Engaged with patient by telephone for follow up visit in response to provider referral for social work chronic care management and care coordination services.   Consent to Services:  The patient was given information about Chronic Care Management services, agreed to services, and gave verbal consent prior to initiation of services.  Please see initial visit note for detailed documentation.   Patient agreed to services and consent obtained.   Summary: Assessed patient's current treatment, progress, coping skills, support system and barriers to care.  Patient was accompanied by her daughter [name] who provided information during this encounter.They received the packet of information form Care guide. Patient does not have Medicaid, would not qualify for [facility] services.  Discussed options that would engage patient with activities to assist with managing symptoms of depression such as behavioral activation to decrease social isolation during the day.  See Care Plan below for interventions and patient self-care actives.  Recommendation: Patient may benefit from, and is in agreement to explore Senior Adult day programs to promotes and meals on wheels.  .   Follow up Plan: Patient's caregiver would like continued follow-up from CCM LCSW .  per caregiver's request CCM LCSW will follow up in 4 weeks.  They will call the office if needed prior to next encounter.   Assessment: Review of patient past medical history, allergies, medications, and health status, including review of relevant consultants reports was performed today as part  of a comprehensive evaluation and provision of chronic care management and care coordination services.     SDOH (Social Determinants of Health) assessments and interventions performed:  SDOH Interventions    Flowsheet Row Most Recent Value  SDOH Interventions   SDOH Interventions for the Following Domains Social Connections  Food Insecurity Interventions Other (Comment)  [meals on wheels information]  Social Connections Interventions Other (Comment)  [[facility]]        Advanced Directives Status: Not addressed in this encounter.  CCM Care Plan  Conditions to be addressed/monitored: CKD Stage 3 and Depression; Level of care concerns  Care Plan : LCSW Plan of Care  Updates made by Maurine Cane, LCSW since 09/07/2021 12:00 AM     Problem: Social and Functional Symptoms      Long-Range Goal: Social and Functional Skills Optimized   Start Date: 08/17/2021  Expected End Date: 11/12/2021  This Visit's Progress: Not on track  Recent Progress: On track  Priority: High  Note:   Current Barriers:  Social Isolation  Level of Care Concerns:Inability to perform ADL's independently Does not have Medicaid and will not qualify for personal care services CSW Clinical Goal(s):  Patient  and Caregiver  will work with agencies discussed to address needs related to level of care and social isolation  through collaboration with [place] representative, provider, and care team.   Interventions: 1:1 collaboration with primary care provider regarding development and update of comprehensive plan of care as evidenced by provider attestation and co-signature Inter-disciplinary care team collaboration (see longitudinal plan of care) Evaluation of current treatment plan related to  self management and patient's  adherence to plan as established by provider Review resources, discussed options and provided patient information about  [facility] Social [facility] ( in-home aide  program ) [place] (PCS) Services provided by [employer] pay for home care Meals on [place]:  (Status: New goal.) Evaluation of current treatment plan related to Caregiver Stress Solution-Focused Strategies employed:  Active listening / Reflection utilized  Caregiver stress acknowledged  Consideration of in-home help encouraged : options discussed Discussed caregiver resources and support:     Level of Care Concerns in a patient with CKD Stage 3 and Depression:  (Status: Goal Not Met.) Current level of care: home with other family or significant other(s): family member: Daughter Charity fundraiser of patient safety in current living environment Problem [place] strategies reviewed Provided information for Day programs at  [facility]   Patient Self-Care Activities: Call [facility]  to inquire about the in-home aide program  Call [employer] [facility] [phone] to inquire about Meals on wheels Adult Day programs      Casimer Lanius, LCSW Licensed Clinical Social Worker Warehouse manager [place]  [phone]

## 2021-09-07 NOTE — Patient Instructions (Signed)
Visit Information  Thank you for taking time to visit with me today. Please don't hesitate to contact me if I can be of assistance to you before our next scheduled telephone appointment.  Following are the goals we discussed today:  Connecting for community Support  Patient Self-Care Activities: Call [facility]  to inquire about the in-home aide program  Call [employer] [facility] [phone] to inquire about Meals on wheels Adult Day programs   Our next appointment is by telephone on Feb 21st at 1:00  Please call the care guide team at [phone] if you need to cancel or reschedule your appointment.   If you are experiencing a Mental Health or [place] or need someone to talk to, please call the Suicide and Crisis Lifeline: 988 call 1-800-273-TALK (toll free, 24 hour hotline) call 911   The patient verbalized understanding of instructions, educational materials, and care plan provided today and agreed to receive a mailed copy of patient instructions, educational materials, and care plan.   Casimer Lanius, LCSW Licensed Clinical Social Worker [facility]  [facility] [place]  [phone]

## 2021-09-14 DIAGNOSIS — I4821 Permanent atrial fibrillation: Secondary | ICD-10-CM

## 2021-09-14 DIAGNOSIS — F418 Other specified anxiety disorders: Secondary | ICD-10-CM

## 2021-09-14 DIAGNOSIS — E039 Hypothyroidism, unspecified: Secondary | ICD-10-CM

## 2021-09-14 DIAGNOSIS — I10 Essential (primary) hypertension: Secondary | ICD-10-CM

## 2021-09-27 ENCOUNTER — Ambulatory Visit (INDEPENDENT_AMBULATORY_CARE_PROVIDER_SITE_OTHER): Payer: Medicare Other | Admitting: *Deleted

## 2021-09-27 DIAGNOSIS — F418 Other specified anxiety disorders: Secondary | ICD-10-CM

## 2021-09-27 DIAGNOSIS — I4821 Permanent atrial fibrillation: Secondary | ICD-10-CM

## 2021-09-27 DIAGNOSIS — R296 Repeated falls: Secondary | ICD-10-CM

## 2021-09-27 NOTE — Chronic Care Management (AMB) (Signed)
Chronic Care Management   CCM RN Visit Note  09/27/2021 Name: [name] [name] MRN: [id number] DOB: 03/06/1934  Subjective: [name] [name] is a 88 y.o. year old female who is a primary care patient of [name], MD. The care management team was consulted for assistance with disease management and care coordination needs.    Engaged with patient by telephone for follow up visit in response to provider referral for case management and/or care coordination services.   Consent to Services:  The patient was given information about Chronic Care Management services, agreed to services, and gave verbal consent prior to initiation of services.  Please see initial visit note for detailed documentation.  Patient agreed to services and verbal consent obtained.   Assessment: Review of patient past medical history, allergies, medications, health status, including review of consultants reports, laboratory and other test data, was performed as part of comprehensive evaluation and provision of chronic care management services.  CCM Care Plan  Allergies  Allergen Reactions   No Known Allergies    Outpatient Encounter Medications as of 09/27/2021  Medication Sig Note   acetaminophen (TYLENOL) 650 MG CR tablet Take 1,300 mg by mouth every 8 (eight) hours as needed for pain.    apixaban (ELIQUIS) 5 MG TABS tablet Take 1 tablet (5 mg total) by mouth 2 (two) times daily.    atorvastatin (LIPITOR) 40 MG tablet Take 1 tablet (40 mg total) by mouth daily.    dapagliflozin propanediol (FARXIGA) 10 MG TABS tablet Take 1 tablet (10 mg total) by mouth daily before breakfast.    diltiazem (CARDIZEM CD) 180 MG 24 hr capsule Take 1 capsule (180 mg total) by mouth daily.    furosemide (LASIX) 40 MG tablet Take 1 tablet (40 mg total) by mouth daily.    hydrochlorothiazide (MICROZIDE) 12.5 MG capsule Take 12.5 mg by mouth daily.    isosorbide mononitrate (IMDUR) 120 MG 24 hr tablet Take 120 mg by mouth Daily.      levothyroxine (SYNTHROID) 75 MCG tablet Take 75 mcg by mouth daily.    losartan (COZAAR) 100 MG tablet Take 100 mg by mouth daily.    traZODone (DESYREL) 150 MG tablet Take 1.5 tablets (225 mg total) by mouth at bedtime. Taking 1 & 1/2 tablet at bedtime 08/19/2021: 08/19/21:Daughter reports taking one half of one tablet qHS   venlafaxine XR (EFFEXOR-XR) 150 MG 24 hr capsule Take 1 capsule (150 mg total) by mouth daily with breakfast.    No facility-administered encounter medications on file as of 09/27/2021.   Patient Active Problem List   Diagnosis Date Noted   Encounter for general adult medical examination with abnormal findings 07/30/2021   Ataxia 07/28/2021   Frequent falls 07/28/2021   Permanent atrial fibrillation ([place]) 01/26/2021   Stage 3b chronic kidney disease ([place]) 01/26/2021   Type II diabetes mellitus ([place])    Depression with anxiety 06/25/2020   Chronic anticoagulation 11/08/2019   Sleep apnea 11/08/2019   PAF (paroxysmal atrial fibrillation) ([place]) 12/20/2018   Primary localized osteoarthritis of right knee 09/20/2016   Primary osteoarthritis of right knee 09/20/2016   Primary osteoarthritis of left knee 10/13/2015   Radiculopathy 07/08/2015   Preretinal fibrosis, 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   Chronic diastolic HF (heart failure) ([place]) 07/03/2009   Aortic valve disorder 06/17/2009   Hypothyroidism 07/28/2008   Dyslipidemia, goal LDL below 70 07/28/2008   Essential hypertension 07/28/2008   Conditions to be addressed/monitored:  Atrial Fibrillation, HTN, and medication non-adherence and frequent  falls  Care Plan : RN Care Manager Plan of Care  Updates made by [name], RN since 09/27/2021 12:00 AM     Problem: Chronic Disease Management Needs   Priority: High     Long-Range Goal: Ongoing adherence to established plan of care for long term chronic disease management   Start Date: 08/19/2021  Expected End Date: 08/19/2022  Priority: High  Note:   Current Barriers:   Chronic Disease Management support and education needs related to Atrial Fibrillation, HTN, and frequent falls Non-adherence to prescribed medication regimen Ongoing depression Multiple falls- patient/ caregiver report 6-plus falls, without serious injury, over last 12 months: patient uses cane/ walker as needed 09/27/21-- reports only one fall without injury since last outreach 08/19/21-- last fall occurred 08/20/21- so pateint has been fall-free x 5 weeks; continues using cane/ walker "occasionally" Memory issues reported by caregiver/ patient Need for in-home care assistance: CCM CSW team active; working to obtain PCS through patient's medicaid benefits Confirmed through review of CCM CSW notes that patient does not qualify for medicaid/ PCS Caregiver reports 09/27/21 that they are not able to afford private duty in-home care assistance Caregiver reports 09/27/21 that patient is not interested/ willing to go to [place]: states pateint sleeps too late in day, and daughter would not be able to provide transportation due to work hours 09/27/21- Caregiver confirms they have spoken to [employer] for transportation resources: states "they don't come [place]  RNCM Clinical Goal(s):  Patient will demonstrate improved health management independence as evidenced by adherence to plan of care for medication regimen, AF/ HTN, fall prevention        through collaboration with RN Care manager, provider, and care team.   Interventions: 1:1 collaboration with primary care provider regarding development and update of comprehensive plan of care as evidenced by provider attestation and co-signature Inter-disciplinary care team collaboration (see longitudinal plan of care) Evaluation of current treatment plan related to  self management and patient's adherence to plan as established by provider Initial assessment completed 08/19/21 09/27/21: Medications  again discussed: patient/ caregiver report patient still not consistently take morning medications; daughter reports since our last outreach patient has missed "at least" 8 days of her morning medications; they have tried the strategies we previously discussed, and while patient has not missed "as many" days as before, she continues to miss taking them on a regular basis; reasons include: "patient sleeps too late in the day and just doesn't want to or doesn't remember" to take medications when her daughter is not physically present in the home (is at work) to remind her 09/27/21: discussed CCM Pharmacy referral-- daughter is willing today to engage with CCM pharmacy team, will make referral accordingly Report from 08/19/21: daughter/ caregiver prepares medications in 7-day pill boxes, one for morning, one for evening; caregiver works during daytime hours and often returns home from work to discover that patient has not taken morning medications; patient states, "I just get tired of swallowing so many pills all the time;" discussed purpose of medications, as well as patient's consistently high blood pressures; discussed value of medication adherence in setting of A-F/ HTN; patient states she "will try to do better;" she declines need for CCM pharmacy referral, "for now," stating she would like to try herself to improve adherence; we discussed strategies that might help patient in "remembering" medications, although the primary barrier appears to be that patient just  does not "like" having to take "so many medications;" caregiver reports that in general, patient misses "2 or 3 days a week" of morning medications; caregiver agrees to write days patient misses taking morning medications down on her calendar for our future discussion/ care planning Depression screening completed 08/19/21: patient reports ongoing depression/ sadness- she is currently on antidepressant medications, but as noted above, misses weekly doses  of this medication; 09/27/21: reinforced previously provided education around mechanism of action for antidepressant medications, and provided rationale around why it is important to take every day, to maintain medication levels in her body; patient denies SI today-- CCM CSW and PCP made aware of patient's ongoing report of depressive symptoms; encouraged caregiver and patient to discuss with both CSW team (already active) and with PCP; patient admits that she has been very worried about her adult son who is scheduled to have "heart surgery for a valve replacement" on August 31, 2021-- today, 09/27/21, patient reports her son had this surgery as planned and added that he is "doing real good" post-recent surgery  Falls:  (Status: 09/27/21: Goal on Track (progressing): YES.) Long Term Goal  Provided written and verbal education re: potential causes of falls and Fall prevention strategies Advised patient to discuss ongoing inconsistent adherence to prescribed medication regimen and caregiver's report of declining memory with provider Falls assessment updated: as noted above, no falls x approximately 5 weeks-- this represents improvement from the weekly- biweekly falls they had previously reported Last fall reported 08/20/21- patient fell backwards off front entry way, no injury; apparently patient insisted on using front entry (instead of back ramp) without assistance from caregiver Confirmed patient has since been using ramp at home off back entry; caregiver reports floor heat/ air vents in the mobile home they live in, which has caused patient to fall in past- patient is now avoiding these areas in the floor when she is at home, this is helping- has had no indoor falls since last outreach 08/19/21  Confirms patient uses walker/ cane consistently when she goes out; inconsistently when she is at home Previously provided education around fall risks/ complications/ prevention reinforced  Hypertension: (Status:  09/27/21: Goal on track: NO.) Long Term Goal  Last practice recorded BP readings:  BP Readings from Last 3 Encounters:  07/28/21 (!) 146/98  06/23/21 (!) 160/100  01/26/21 132/68  Most recent eGFR/CrCl: No results found for: EGFR  No components found for: CRCL  Evaluation of current treatment plan related to hypertension self management and patient's adherence to plan as established by provider;   Discussed plans with patient for ongoing care management follow up and provided patient with direct contact information for care management team; Discussed complications of poorly controlled blood pressure such as heart disease, stroke, circulatory complications, vision complications, kidney impairment, sexual dysfunction;  Reviewed recent blood pressures from provider office visits with caregiver and patient: discussed effect of medication non-adherence with both; patient remains uninterested in monitoring blood pressures at home/ outside in community Provided education around risks of ongoing HTN Discussed benefits of following heart healthy, low salt, low cholesterol diet Importance of medication adherence reinforced/ reiterated throughout call-- as noted above, CCM pharmacy referral placed Confirmed no recent signs/ symptoms A-Fib; education provided along with action plan for same  Patient Goals/Self-Care Activities: As evidenced by review of EHR, collaboration with care team, and patient reporting during CCM RN CM outreach,  Patient [name] [name] will: Take medications as prescribed- I have asked the pharmacist ar Dr. Ronnald Ramp to contact you  to discuss strategies to help you remember to take your medications- please listen out for a call from the pharmacy team to schedule this phone call appointment Attend all scheduled provider appointments Call pharmacy for medication refills Call provider office for new concerns or questions Stay in touch with your doctor and to the Clinical Social Worker about your  ongoing depression Continue to try to follow heart healthy, low salt, low cholesterol, carbohydrate-modified, low sugar diet Keep up the effort you have made to prevent falls-- use your cane/ walker as needed, and avoid areas and situations that may cause you to fall    Plan: Telephone follow up appointment with care management team member scheduled for:  Wednesday, November 10, 2021 at 11:30 am The patient has been provided with contact information for the care management team and has been advised to call with any health related questions or concerns  [name], RN, BSN, [place] [phone]: direct office

## 2021-09-27 NOTE — Patient Instructions (Signed)
Visit Information  [name] [name] and [name] [name], thank you for taking time to talk with me today about [name] [name]'s healthcare needs. Please don't hesitate to contact me if I can be of assistance to you before our next scheduled telephone appointment.  Below are the goals we discussed today:  Patient Self-Care Activities: Patient [name] [name] will: Take medications as prescribed- I have asked the pharmacist ar Dr. Ronnald Ramp to contact you to discuss strategies to help you remember to take your medications- please listen out for a call from the pharmacy team to schedule this phone call appointment Attend all scheduled provider appointments Call pharmacy for medication refills Call provider office for new concerns or questions Stay in touch with your doctor and to the Clinical Social Worker about your ongoing depression Continue to try to follow heart healthy, low salt, low cholesterol, carbohydrate-modified, low sugar diet Keep up the effort you have made to prevent falls-- use your cane/ walker as needed, and avoid areas and situations that may cause you to fall  Our next scheduled telephone follow up visit/ appointment with care management team member is scheduled on:   Wednesday, November 10, 2021 at 11:30 am- This is a PHONE [place] appointment  If you need to cancel or re-schedule our visit, please call [phone] and our care guide team will be happy to assist you.   I look forward to hearing about your progress.   [name], RN, BSN, [place] [phone]: direct office  If you are experiencing a Mental Health or [place] or need someone to talk to, please  call the Suicide and Crisis Lifeline: 988 call the [place] National Suicide Prevention Lifeline: (670)714-4120 or TTY: 581-006-6837 TTY (509) 187-3076) to talk to a trained counselor call 1-800-273-TALK (toll free, 24 hour hotline) go to [facility]  [address] [place] ([phone]) call 911   The caregiver/ patient verbalized understanding of instructions, educational materials, and care plan provided today and agreed to receive a mailed copy of patient instructions, educational materials, and care plan  Fall Prevention in the Home, Adult Falls can cause injuries and can happen to people of all ages. There are many things you can do to make your home safe and to help prevent falls. Ask for help when making these changes. What actions can I take to prevent falls? General Instructions Use good lighting in all rooms. Replace any light bulbs that burn out. Turn on the lights in dark areas. Use night-lights. Keep items that you use often in easy-to-reach places. Lower the shelves around your home if needed. Set up your furniture so you have a clear path. Avoid moving your furniture around. Do not have throw rugs or other things on the floor that can make you trip. Avoid walking on wet floors. If any of your floors are uneven, fix them. Add color or contrast paint or tape to clearly mark and help you see: Grab bars or handrails. First and last steps of staircases. Where the edge of each step is. If you use a stepladder: Make sure that it is fully opened. Do not climb a closed stepladder. Make sure the sides of the stepladder are locked in place. Ask someone to hold the stepladder while you use it. Know where your pets are when moving through your home. What can I do in the bathroom?   Keep the floor dry. Clean up any water on the floor right away. Remove  soap buildup in the tub or shower. Use nonskid mats or decals on the floor of the tub or shower. Attach bath mats securely with double-sided, nonslip rug tape. If you need to sit down in the shower, use a plastic, nonslip stool. Install grab bars by the toilet and in the tub and shower. Do not use towel bars as grab bars. What can I do in the bedroom? Make sure that you  have a light by your bed that is easy to reach. Do not use any sheets or blankets for your bed that hang to the floor. Have a firm chair with side arms that you can use for support when you get dressed. What can I do in the kitchen? Clean up any spills right away. If you need to reach something above you, use a step stool with a grab bar. Keep electrical cords out of the way. Do not use floor polish or wax that makes floors slippery. What can I do with my stairs? Do not leave any items on the stairs. Make sure that you have a light switch at the top and the bottom of the stairs. Make sure that there are handrails on both sides of the stairs. Fix handrails that are broken or loose. Install nonslip stair treads on all your stairs. Avoid having throw rugs at the top or bottom of the stairs. Choose a carpet that does not hide the edge of the steps on the stairs. Check carpeting to make sure that it is firmly attached to the stairs. Fix carpet that is loose or worn. What can I do on the outside of my home? Use bright outdoor lighting. Fix the edges of walkways and driveways and fix any cracks. Remove anything that might make you trip as you walk through a door, such as a raised step or threshold. Trim any bushes or trees on paths to your home. Check to see if handrails are loose or broken and that both sides of all steps have handrails. Install guardrails along the edges of any raised decks and porches. Clear paths of anything that can make you trip, such as tools or rocks. Have leaves, snow, or ice cleared regularly. Use sand or salt on paths during winter. Clean up any spills in your garage right away. This includes grease or oil spills. What other actions can I take? Wear shoes that: Have a low heel. Do not wear high heels. Have rubber bottoms. Feel good on your feet and fit well. Are closed at the toe. Do not wear open-toe sandals. Use tools that help you move around if needed. These  include: Canes. Walkers. Scooters. Crutches. Review your medicines with your doctor. Some medicines can make you feel dizzy. This can increase your chance of falling. Ask your doctor what else you can do to help prevent falls. Where to find more information Centers for Disease Control and Prevention, [place]: http://www.wolf.info/ National Institute on Aging: http://kim-miller.com/ Contact a doctor if: You are afraid of falling at home. You feel weak, drowsy, or dizzy at home. You fall at home. Summary There are many simple things that you can do to make your home safe and to help prevent falls. Ways to make your home safe include removing things that can make you trip and installing grab bars in the bathroom. Ask for help when making these changes in your home. This information is not intended to replace advice given to you by your health care provider. Make sure you discuss any questions  you have with your health care provider. Document Revised: 03/04/2020 Document Reviewed: 03/04/2020 Elsevier Patient Education  [place].

## 2021-09-28 ENCOUNTER — Telehealth: Payer: Self-pay | Admitting: Internal Medicine

## 2021-09-28 NOTE — Chronic Care Management (AMB) (Signed)
°  Chronic Care Management   Note  09/28/2021 Name: GERLENE GLASSBURN MRN: [id number] DOB: May 14, 1934  Aryanna Shaver Schweiss is a 88 y.o. year old female who is a primary care patient of [name], MD. I reached out to [place] by phone today in response to a referral sent by Ms. Adrian Prows Flater's PCP, [name], MD.   Ms. Nordstrom was given information about Chronic Care Management services today including:  CCM service includes personalized support from designated clinical staff supervised by her physician, including individualized plan of care and coordination with other care providers 24/7 contact phone numbers for assistance for urgent and routine care needs. Service will only be billed when office clinical staff spend 20 minutes or more in a month to coordinate care. Only one practitioner may furnish and bill the service in a calendar month. The patient may stop CCM services at any time (effective at the end of the month) by phone call to the office staff.   Patient agreed to services and verbal consent obtained.   Follow up plan:   [place]ana Secretary/administrator

## 2021-09-30 ENCOUNTER — Encounter: Payer: Self-pay | Admitting: Internal Medicine

## 2021-09-30 ENCOUNTER — Telehealth (INDEPENDENT_AMBULATORY_CARE_PROVIDER_SITE_OTHER): Payer: Medicare Other | Admitting: Internal Medicine

## 2021-09-30 VITALS — Wt 175.0 lb

## 2021-09-30 DIAGNOSIS — U071 COVID-19: Secondary | ICD-10-CM

## 2021-09-30 MED ORDER — NIRMATRELVIR/RITONAVIR (PAXLOVID)TABLET: 3.0000 | ORAL_TABLET | Freq: Two times a day (BID) | ORAL | 0 refills | Status: AC

## 2021-09-30 NOTE — Progress Notes (Signed)
Virtual Visit via Video Note  I connected with [name] [name] on 09/30/21 at  9:30 AM EST by a video enabled telemedicine application and verified that I am speaking with the correct person using two identifiers.  Location patient: home Location provider: work office Persons participating in the virtual visit: patient, provider  I discussed the limitations of evaluation and management by telemedicine and the availability of in person appointments. The patient expressed understanding and agreed to proceed.   HPI: She has called to inform us that she tested positive for COVID yesterday.  Her symptoms started 3 days ago and progressed over the course of the next 2 days.  She has had sore throat, headache, rhinorrhea, today she feels extremely weak and has body aches as well as a fever as high as 101.3.  Her daughter, whom she lives with, also has COVID.   ROS: Constitutional: Positive for fever, chills,  appetite change and fatigue.  HEENT: Denies photophobia, eye pain, redness,  mouth sores, trouble swallowing, neck pain, neck stiffness and tinnitus.   Respiratory: Denies SOB, DOE,chest tightness,  and wheezing.   Cardiovascular: Denies chest pain, palpitations and leg swelling.  Gastrointestinal: Denies nausea, vomiting, abdominal pain, diarrhea, constipation, blood in stool and abdominal distention.  Genitourinary: Denies dysuria, urgency, frequency, hematuria, flank pain and difficulty urinating.  Endocrine: Denies: hot or cold intolerance, sweats, changes in hair or nails, polyuria, polydipsia. Musculoskeletal: Denies  back pain, joint swelling, arthralgias and gait problem.  Skin: Denies pallor, rash and wound.  Neurological: Denies dizziness, seizures, syncope, light-headedness, numbness and headaches.  Hematological: Denies adenopathy. Easy bruising, personal or family bleeding history  Psychiatric/Behavioral: Denies suicidal ideation, mood changes, confusion, nervousness, sleep  disturbance and agitation   Past Medical History:  Diagnosis Date   Anginal pain ([place])    Anxiety    Arthritis    "little in my thumbs"back & knees    Breast cancer ([place])    R breast, treated with mastectomy/tomoxifen in early 2000s   Bronchitis    hx of   CAD (coronary artery disease)    The patient had a left heart catheterization in August 2006 showed an EF of 70%, a 70% mid LAD lesion and 80% distal LAD ledsion, as well as 50% ostial first diagonal lesion.  The patient did have a drug-eluting stent placed in her mid LAD at that time.     Chest pressure 03/13/2019   Depression    Diastolic heart failure    Echo 11/10 with EF 55-60%, moderate diastolic dysfunction, no regional WMAs, mild to moderate TR, moderate LAE, mild AI   Frequent urination at night    GERD (gastroesophageal reflux disease)    "gone since gallbladder took out"   Heart murmur    "small"   Hypercholesterolemia    Hypertension    Hypothyroidism    Mental disorder    Sleep disturbance    sleep study done- told that there wasn't any apnea concerns, states she is up & down for use of bathroom several times per night    Type II diabetes mellitus ([place])    "controlled w/diet and exercise"   Urinary incontinence     Past Surgical History:  Procedure Laterality Date   25 GAUGE PARS PLANA VITRECTOMY WITH 20 GAUGE MVR PORT Right 06/04/2013   Procedure: 25 GAUGE PARS PLANA VITRECTOMY WITH 20 GAUGE MVR PORT / REMOVAL SILICONE OIL RIGHT EYE. With Endolaser;  Surgeon: Hayden Pedro, MD;  Location: [facility];  Service: Ophthalmology;  Laterality: Right;   ABDOMINAL HYSTERECTOMY  1964   Adenosine Myoview     9/09: EF 69% with normal perfusion images suggesting no ischemia or infarction.     [place] SURGERY  06/2015   fusion-lumbar   BREAST BIOPSY  2001   right   CARDIAC CATHETERIZATION     CARDIOVERSION N/A 03/15/2019   Procedure: CARDIOVERSION;  Surgeon: Fay Records, MD;  Location: [facility] ENDOSCOPY;   Service: Cardiovascular;  Laterality: N/A;   CATARACT EXTRACTION W/ INTRAOCULAR LENS  IMPLANT, BILATERAL  ~ 2000   CHOLECYSTECTOMY  ~ 2004   COLONOSCOPY     CORONARY ANGIOPLASTY     CORONARY ANGIOPLASTY WITH STENT PLACEMENT  2000's   "1"   DILATION AND CURETTAGE OF UTERUS  1960's   "I had 3"   EYE SURGERY     /iol    following cataract removal   GAS/FLUID EXCHANGE Right 06/04/2013   Procedure: GAS/FLUID EXCHANGE;  Surgeon: Hayden Pedro, MD;  Location: [facility];  Service: Ophthalmology;  Laterality: Right;   LEFT HEART CATH AND CORONARY ANGIOGRAPHY N/A 03/14/2019   Procedure: LEFT HEART CATH AND CORONARY ANGIOGRAPHY;  Surgeon: Belva Crome, MD;  Location: [facility] CV LAB;  Service: Cardiovascular;  Laterality: N/A;   MASTECTOMY  2001   right breast   MEMBRANE PEEL Right 06/04/2013   Procedure: MEMBRANE PEEL;  Surgeon: Hayden Pedro, MD;  Location: [facility];  Service: Ophthalmology;  Laterality: Right;   NEUROPLASTY / TRANSPOSITION MEDIAN NERVE AT CARPAL TUNNEL BILATERAL  1990's   PARS PLANA VITRECTOMY  04/03/2012   right   PARS PLANA VITRECTOMY  04/03/2012   Procedure: PARS PLANA VITRECTOMY WITH 25 GAUGE;  Surgeon: Hayden Pedro, MD;  Location: [facility];  Service: Ophthalmology;  Laterality: Right;  Silicone Oil, Perfluron, Repair Complex Traction Retinal Detachment   PLANTAR FASCIA RELEASE  1990's   left   TONSILLECTOMY AND ADENOIDECTOMY  1972   TOTAL KNEE ARTHROPLASTY Left 10/13/2015   Procedure: TOTAL KNEE ARTHROPLASTY;  Surgeon: Melrose Nakayama, MD;  Location: [facility];  Service: Orthopedics;  Laterality: Left;   TOTAL KNEE ARTHROPLASTY Right 09/20/2016   Procedure: TOTAL KNEE ARTHROPLASTY;  Surgeon: Melrose Nakayama, MD;  Location: [facility];  Service: Orthopedics;  Laterality: Right;   ULNAR NERVE REPAIR     left arm    Family History  Problem Relation Age of Onset   Cancer Mother    Alcoholism Father    Cancer Brother     SOCIAL HX:   reports that she has never smoked. She has  never used smokeless tobacco. She reports that she does not drink alcohol and does not use drugs.   Current Outpatient Medications:    acetaminophen (TYLENOL) 650 MG CR tablet, Take 1,300 mg by mouth every 8 (eight) hours as needed for pain., Disp: , Rfl:    apixaban (ELIQUIS) 5 MG TABS tablet, Take 1 tablet (5 mg total) by mouth 2 (two) times daily., Disp: 60 tablet, Rfl: 0   atorvastatin (LIPITOR) 40 MG tablet, Take 1 tablet (40 mg total) by mouth daily., Disp: 90 tablet, Rfl: 1   dapagliflozin propanediol (FARXIGA) 10 MG TABS tablet, Take 1 tablet (10 mg total) by mouth daily before breakfast., Disp: 90 tablet, Rfl: 1   diltiazem (CARDIZEM CD) 180 MG 24 hr capsule, Take 1 capsule (180 mg total) by mouth daily., Disp: 90 capsule, Rfl: 1   hydrochlorothiazide (MICROZIDE) 12.5 MG capsule, Take  12.5 mg by mouth daily., Disp: , Rfl:    isosorbide mononitrate (IMDUR) 120 MG 24 hr tablet, Take 120 mg by mouth Daily. , Disp: , Rfl:    levothyroxine (SYNTHROID) 75 MCG tablet, Take 75 mcg by mouth daily., Disp: , Rfl:    losartan (COZAAR) 100 MG tablet, Take 100 mg by mouth daily., Disp: , Rfl:    nirmatrelvir/ritonavir EUA (PAXLOVID) 20 x 150 MG & 10 x 100MG  TABS, Take 3 tablets by mouth 2 (two) times daily for 5 days. (Take nirmatrelvir 150 mg two tablets twice daily for 5 days and ritonavir 100 mg one tablet twice daily for 5 days) Patient GFR is 62, Disp: 30 tablet, Rfl: 0   traZODone (DESYREL) 150 MG tablet, Take 1.5 tablets (225 mg total) by mouth at bedtime. Taking 1 & 1/2 tablet at bedtime, Disp: 135 tablet, Rfl: 1   venlafaxine XR (EFFEXOR-XR) 150 MG 24 hr capsule, Take 1 capsule (150 mg total) by mouth daily with breakfast., Disp: 90 capsule, Rfl: 1   furosemide (LASIX) 40 MG tablet, Take 1 tablet (40 mg total) by mouth daily., Disp: 30 tablet, Rfl: 3  EXAM:   VITALS per patient if applicable: None reported  GENERAL: alert, oriented, appears acutely ill, sounds congested.  HEENT:  atraumatic, conjunttiva clear, no obvious abnormalities on inspection of external nose and ears  NECK: normal movements of the head and neck  LUNGS: on inspection no signs of respiratory distress, breathing rate appears normal, no obvious gross increased work of breathing, gasping or wheezing  CV: no obvious cyanosis  MS: moves all visible extremities without noticeable abnormality  PSYCH/NEURO: pleasant and cooperative, no obvious depression or anxiety, speech and thought processing grossly intact  ASSESSMENT AND PLAN:   COVID-19  - Plan: nirmatrelvir/ritonavir EUA (PAXLOVID) 20 x 150 MG & 10 x 100MG  TABS -We have reviewed quarantine period of 5 days. -We have discussed symptoms that would promote ED evaluation. -She knows to follow with [place] if symptoms fail to resolve.  -I have suggested the following changes to her chronic medications while on Paxlovid:  Discontinue use of atorvastatin and trazodone. Take diltiazem every other day. Decrease Eliquis dosing to 2.5 mg twice daily.     I discussed the assessment and treatment plan with the patient. The patient was provided an opportunity to ask questions and all were answered. The patient agreed with the plan and demonstrated an understanding of the instructions.   The patient was advised to call back or seek an in-person evaluation if the symptoms worsen or if the condition fails to improve as anticipated.    Lelon Frohlich, MD  [facility]

## 2021-10-05 ENCOUNTER — Telehealth: Payer: Self-pay

## 2021-10-05 ENCOUNTER — Telehealth: Payer: Medicare Other

## 2021-10-05 NOTE — Chronic Care Management (AMB) (Signed)
Error Opened in wrong context

## 2021-10-05 NOTE — Chronic Care Management (AMB) (Signed)
°  Care Management   Note  10/05/2021 Name: [name] [name] MRN: [id number] DOB: 04-19-1934  [name] [name] is a 88 y.o. year old female who is a primary care patient of [name], MD and is actively engaged with the care management team. I reached out to [place] by phone today to assist with re-scheduling a follow up visit with the Licensed Clinical Social Worker  Follow up plan: Unsuccessful telephone outreach attempt made. A HIPAA compliant phone message was left for the patient providing contact information and requesting a return call.  The care management team will reach out to the patient again.  If patient returns call to provider office, please advise to call [facility]  at [place], [address], [place], [place] [address] Direct Dial: [phone] [email or web address]: [email or web address]

## 2021-10-11 ENCOUNTER — Ambulatory Visit: Payer: Medicare Other | Admitting: Licensed Clinical Social Worker

## 2021-10-11 DIAGNOSIS — I10 Essential (primary) hypertension: Secondary | ICD-10-CM

## 2021-10-11 DIAGNOSIS — R296 Repeated falls: Secondary | ICD-10-CM

## 2021-10-11 NOTE — Patient Instructions (Addendum)
Visit Information  Thank you for taking time to visit with me today. Please don't hesitate to contact me if I can be of assistance to you before our next scheduled telephone appointment.  Following are the goals we discussed today:  Level of Care concerns  Patient Self-Care Activities: Discuss falls with Dr. Ronnald Ramp for possible Home health for PT and OT Call [place]  [phone] or State number [phone]  to inquire about the in-home aide program  Call [facility] [phone] to inquire about Meals on wheels Adult Day programs   Our next appointment is by telephone on April 3rd  at 12:00  Please call the care guide team at [phone] if you need to cancel or reschedule your appointment.   If you are experiencing a Mental Health or [place] or need someone to talk to, please call the [place] National Suicide Prevention Lifeline: 819-214-2777 or TTY: 336-278-9987 TTY [phone]) to talk to a trained counselor call 1-800-273-TALK (toll free, 24 hour hotline) call 911   The patient verbalized understanding of instructions, educational materials, and care plan provided today and agreed to receive a mailed copy of patient instructions, educational materials, and care plan.   Casimer Lanius, LCSW Licensed Clinical Social Worker [facility]  [place]  [phone]

## 2021-10-11 NOTE — Chronic Care Management (AMB) (Signed)
Chronic Care Management   Clinical Social Work Note  10/11/2021 Name: [name] [name] MRN: [id number] DOB: 12/19/1933  [name] [name] is a 88 y.o. year old female who is a primary care patient of [name], MD. The CCM team was consulted to assist the patient with chronic disease management and/or care coordination needs related to: Level of Care Concerns and Caregiver Stress.   Engaged with patient by telephone for follow up visit in response to provider referral for social work chronic care management and care coordination services.   Consent to Services:  The patient was given information about Chronic Care Management services, agreed to services, and gave verbal consent prior to initiation of services.  Please see initial visit note for detailed documentation.  Patient agreed to services and consent obtained.   Summary: Patient was accompanied by her daughter [name] [name] who provided information during this encounter. Patient and daughter reports increase in falls ( fall today and Sat) Daughter is experiencing difficulty with connecting to resources discussed for day programs as patient was not willing to attend.  Patient is now willing to go. CCM LCSW collaborated with CCM RN to share information on falls  to assist with meeting patient's needs.  .  See Care Plan below for interventions and patient self-care actives.  Recommendation: Patient may benefit from, and is in agreement to use her walker. She may also benefit from [facility] to assist with improvement in her gait and with utilizing the walker.  Falls happened with cane and using no assistance devises. Daughter will also explore adult day programs. Shares with PCP in anticipation of ov on 3/22323 .   Follow up Plan: Patient's caregiver would like continued follow-up from CCM LCSW .  per caregiver's request CCM LCSW will follow up in 30 days.  They will call the office if needed prior to next encounter.   Assessment: Review of patient past  medical history, allergies, medications, and health status, including review of relevant consultants reports was performed today as part of a comprehensive evaluation and provision of chronic care management and care coordination services.     SDOH (Social Determinants of Health) assessments and interventions performed:    Advanced Directives Status: Not addressed in this encounter.  CCM Care Plan  Conditions to be addressed/monitored: HTN, Anxiety, and Depression; Level of care concerns  Care Plan : LCSW Plan of Care  Updates made by Maurine Cane, LCSW since 10/11/2021 12:00 AM     Problem: Social and Functional decline      Long-Range Goal: Social and Functional Skills enhanced   Start Date: 08/17/2021  This Visit's Progress: On track  Recent Progress: Not on track  Priority: High  Note:   Current Barriers:  Social Isolation  Level of Care Concerns:Inability to perform ADL's independently Does not have Medicaid and will not qualify for personal care services CSW Clinical Goal(s):  Patient  and Caregiver  will work with agencies discussed to address needs related to level of care and social isolation  through collaboration with [place] representative, provider, and care team.   Interventions: 1:1 collaboration with primary care provider regarding development and update of comprehensive plan of care as evidenced by provider attestation and co-signature Inter-disciplinary care team collaboration (see longitudinal plan of care) Evaluation of current treatment plan related to  self management and patient's adherence to plan as established by provider Review resources, discussed options and provided patient information about  [facility] (  in-home aide program ) Services provided by [employer] (Day programs and meals on wheels)  Mental Health:  (Status: New goal.) Evaluation of current treatment plan related to Caregiver Stress Solution-Focused Strategies  employed:  Active listening / Reflection utilized  Emotional Support Provided Problem [place] strategies reviewed Caregiver stress acknowledged  Discussed caregiver resources and support:     Level of Care Concerns in a patient with CKD Stage 3 and Depression:  (Status: Goal on Track (progressing): YES.) Current level of care: home with other family or significant other(s): family member: Daughter Charity fundraiser of patient safety in current living environment Problem [place] strategies reviewed Provided information for Day programs at  [place] with CCM RN as patient and caregiver reported additional falls today and last Sat. Behavioral Activation  Review and reminded of all upcoming appointments  Patient Self-Care Activities: Discuss falls with Dr. Ronnald Ramp for possible Home health for PT and OT Call [place]  [phone] or State number [phone]  to inquire about the in-home aide program  Call [facility] [phone] to inquire about Meals on wheels Adult Day programs      Casimer Lanius, LCSW Licensed Clinical Social Worker [facility]  [place]  [phone]

## 2021-10-12 ENCOUNTER — Other Ambulatory Visit: Payer: Self-pay | Admitting: Internal Medicine

## 2021-10-12 DIAGNOSIS — I10 Essential (primary) hypertension: Secondary | ICD-10-CM | POA: Diagnosis not present

## 2021-10-12 DIAGNOSIS — I4821 Permanent atrial fibrillation: Secondary | ICD-10-CM

## 2021-10-12 DIAGNOSIS — F418 Other specified anxiety disorders: Secondary | ICD-10-CM | POA: Diagnosis not present

## 2021-10-12 DIAGNOSIS — R296 Repeated falls: Secondary | ICD-10-CM

## 2021-10-12 DIAGNOSIS — M1711 Unilateral primary osteoarthritis, right knee: Secondary | ICD-10-CM

## 2021-10-12 DIAGNOSIS — R270 Ataxia, unspecified: Secondary | ICD-10-CM

## 2021-11-03 ENCOUNTER — Ambulatory Visit: Payer: Medicare Other | Admitting: Internal Medicine

## 2021-11-10 ENCOUNTER — Ambulatory Visit (INDEPENDENT_AMBULATORY_CARE_PROVIDER_SITE_OTHER): Payer: Medicare Other | Admitting: *Deleted

## 2021-11-10 DIAGNOSIS — I10 Essential (primary) hypertension: Secondary | ICD-10-CM

## 2021-11-10 DIAGNOSIS — I4821 Permanent atrial fibrillation: Secondary | ICD-10-CM

## 2021-11-10 DIAGNOSIS — F418 Other specified anxiety disorders: Secondary | ICD-10-CM

## 2021-11-10 NOTE — Patient Instructions (Signed)
Visit Information ? ?[name], thank you for taking time to talk with me today about Kailynn's (Faye's) health care needs. Please don't hesitate to contact me if I can be of assistance to you before our next scheduled telephone appointment. ? ?Below are the goals we discussed today:  ?Patient Self-Care Activities: ?Patient [name] will: ?Take medications as prescribed- you have an in-person office visit with the pharmacist at [facility] on Dec 16, 2021-- please bring all of your medications to that in-person office visit ?Attend all scheduled provider appointments ?Call pharmacy for medication refills ?Call provider office for new concerns or questions ?Stay in touch with your doctor and with the Clinical Social Worker about your ongoing depression ?Continue to try to follow heart healthy, low salt, low cholesterol, carbohydrate-modified, low sugar diet ?Keep up the efforts you have made to prevent falls-- use your cane/ walker as needed, and avoid areas and situations that may cause you to fall ? ?Our next scheduled telephone follow up visit/ appointment is scheduled on: Tuesday, Dec 28, 2021 at 11:30 am- This is a PHONE [place] appointment ? ?If you need to cancel or re-schedule our visit, please call [phone] and our care guide team will be happy to assist you. ?  ?I look forward to hearing about your progress. ?  ?[name], RN, BSN, CCRN Alumnus ?[place] ?([phone]: direct office ? ?If you are experiencing a Mental Health or [place] or need someone to talk to, please  ?call the Suicide and Crisis Lifeline: 988 ?call the [place] National Suicide Prevention Lifeline: 408-163-6324 or TTY: 646-008-3766 TTY (684)547-0679) to talk to a trained counselor ?call 1-800-273-TALK (toll free, 24 hour hotline) ?go to [facility] [address] [place] [phone]) ?call 911  ? ?The patient's  caregiver/ daughter verbalized understanding of instructions, educational materials, and care plan provided today and agreed to receive a mailed copy of patient instructions, educational materials, and care plan.  ? ?Safety In The Home ? ?Use a variety of textures, such as Velcro, rubber bands and raised dots to provide tactile clues.  Apply to the on/off controls on appliances, at the end of the banister, or on medicine bottles. ? ?Flooring  ?The following suggestions can help reduce the risk of a fall: ?Repair or replace torn carpet because a foot, cane or walker can easily get caught. ?Remove area carpets or throw rugs, especially if your loved one has a shuffling gait or uses a walker. ?When rugs  Or carpeting cannot be eliminated, place non-skid padding under rugs or secure to floor with double sided tape.  Area carpets without padding or tape can easily buckle underneath when walked on, causing a person to slip or fall. ?Use only matte, non-shiny finishes on the floor. ?Doorsills can be tripping hazards.  Remove them whenever possible or paint them a contrasting color. ?Eliminate low furniture that is easy to trip over such as coffee tables and footstools. ?Move furniture against walls to create a large area of uncluttered space in the center of the room.  However, if you are rearranging a room for someone else, discuss beforehand with that person.  Many individuals rely on specific locations of furniture to find their way around a room. ?It is easier to see the sofa or chair when its color contrasts with that of the flooring.  Choose a fabric that contrasts with the floor material or use a bright colored piping  along the edges of the seat cushion. ?Reduce glare on polished furniture by covering it with a large doily or tablecloth. ? ?Fall Prevention in the Home, Adult ?Falls can cause injuries and affect people of all ages. There are many simple things that you can do to make your home safe and to help  prevent falls. Ask for help when making these changes, if needed. ?What actions can I take to prevent falls? ?General instructions ?Use good lighting in all rooms. Replace any light bulbs that burn out, turn on lights if it is dark, and use night-lights. ?Place frequently used items in easy-to-reach places. Lower the shelves around your home if necessary. ?Set up furniture so that there are clear paths around it. Avoid moving your furniture around. ?Remove throw rugs and other tripping hazards from the floor. ?Avoid walking on wet floors. ?Fix any uneven floor surfaces. ?Add color or contrast paint or tape to grab bars and handrails in your home. Place contrasting color strips on the first and last steps of staircases. ?When you use a stepladder, make sure that it is completely opened and that the sides and supports are firmly locked. Have someone hold the ladder while you are using it. Do not climb a closed stepladder. ?Know where your pets are when moving through your home. ?What can I do in the bathroom? ?  ?Keep the floor dry. Immediately clean up any water that is on the floor. ?Remove soap buildup in the tub or shower regularly. ?Use nonskid mats or decals on the floor of the tub or shower. ?Attach bath mats securely with double-sided, nonslip rug tape. ?If you need to sit down while you are in the shower, use a plastic, nonslip stool. ?Install grab bars by the toilet and in the tub and shower. Do not use towel bars as grab bars. ?What can I do in the bedroom? ?Make sure that a bedside light is easy to reach. ?Do not use oversized bedding that reaches the floor. ?Have a firm chair that has side arms to use for getting dressed. ?What can I do in the kitchen? ?Clean up any spills right away. ?If you need to reach for something above you, use a sturdy step stool that has a grab bar. ?Keep electrical cables out of the way. ?Do not use floor polish or wax that makes floors slippery. If you must use wax, make sure  that it is non-skid floor wax. ?What can I do with my stairs? ?Do not leave any items on the stairs. ?Make sure that you have a light switch at the top and the bottom of the stairs. Have them installed if you do not have them. ?Make sure that there are handrails on both sides of the stairs. Fix handrails that are broken or loose. Make sure that handrails are as long as the staircases. ?Install non-slip stair treads on all stairs in your home. ?Avoid having throw rugs at the top or bottom of stairs, or secure the rugs with carpet tape to prevent them from moving. ?Choose a carpet design that does not hide the edge of steps on the stairs. ?Check any carpeting to make sure that it is firmly attached to the stairs. Fix any carpet that is loose or worn. ?What can I do on the outside of my home? ?Use bright outdoor lighting. ?Regularly repair the edges of walkways and driveways and fix any cracks. ?Remove high doorway thresholds. ?Trim any shrubbery on the main path into your home. ?Regularly  check that handrails are securely fastened and in good repair. Both sides of all steps should have handrails. ?Install guardrails along the edges of any raised decks or porches. ?Clear walkways of debris and clutter, including tools and rocks. ?Have leaves, snow, and ice cleared regularly. ?Use sand or salt on walkways during winter months. ?In the garage, clean up any spills right away, including grease or oil spills. ?What other actions can I take? ?Wear closed-toe shoes that fit well and support your feet. Wear shoes that have rubber soles or low heels. ?Use mobility aids as needed, such as canes, walkers, scooters, and crutches. ?Review your medicines with your health care provider. Some medicines can cause dizziness or changes in blood pressure, which increase your risk of falling. ?Talk with your health care provider about other ways that you can decrease your risk of falls. This may include working with a physical therapist or  trainer to improve your strength, balance, and endurance. ?Where to find more information ?Centers for Disease Control and Prevention, [employer]: http://www.wolf.info/ ?[employer] on Aging: http://kim-miller.com/ ?Contact a h

## 2021-11-10 NOTE — Chronic Care Management (AMB) (Signed)
?Chronic Care Management  ? ?CCM RN Visit Note ? ?11/10/2021 ?Name: [name] [name] MRN: [id number] DOB: [birth year] ? ?Subjective: ?[name] [name] is a 88 y.o. year old female who is a primary care patient of [name], MD. The care management team was consulted for assistance with disease management and care coordination needs.   ? ?Engaged with patient's daughter/ caregiver [name], on [facility] DPR by telephone for follow up visit in response to provider referral for case management and/or care coordination services.  ? ?Consent to Services:  ?The patient was given information about Chronic Care Management services, agreed to services, and gave verbal consent prior to initiation of services.  Please see initial visit note for detailed documentation.  ?Patient agreed to services and verbal consent obtained.  ? ?Assessment: Review of patient past medical history, allergies, medications, health status, including review of consultants reports, laboratory and other test data, was performed as part of comprehensive evaluation and provision of chronic care management services.  ? ?SDOH (Social Determinants of Health) assessments and interventions performed:  ?SDOH Interventions   ? ?Flowsheet Row Most Recent Value  ?SDOH Interventions   ?Food Insecurity Interventions Intervention Not Indicated  [caregiver denies food insecurity- confirmed CCM CSW has discussed meals on wheels]  ?Housing Interventions Intervention Not Indicated  [continues to reside with caregiver/ daughter, single family mobile home, one level, ramp off back]  ?Transportation Interventions Intervention Not Indicated  [daughter/ caregiver continues to provide transportation]  ? ?  ?CCM Care Plan ? ?Allergies  ?Allergen Reactions  ? No Known Allergies   ? ?Outpatient Encounter Medications as of 11/10/2021  ?Medication Sig Note  ? acetaminophen (TYLENOL) 650 MG CR tablet Take 1,300 mg by mouth every 8 (eight) hours as needed for pain.   ? apixaban (ELIQUIS) 5 MG  TABS tablet Take 1 tablet (5 mg total) by mouth 2 (two) times daily.   ? atorvastatin (LIPITOR) 40 MG tablet Take 1 tablet (40 mg total) by mouth daily.   ? dapagliflozin propanediol (FARXIGA) 10 MG TABS tablet Take 1 tablet (10 mg total) by mouth daily before breakfast.   ? diltiazem (CARDIZEM CD) 180 MG 24 hr capsule Take 1 capsule (180 mg total) by mouth daily.   ? furosemide (LASIX) 40 MG tablet Take 1 tablet (40 mg total) by mouth daily.   ? hydrochlorothiazide (MICROZIDE) 12.5 MG capsule Take 12.5 mg by mouth daily.   ? isosorbide mononitrate (IMDUR) 120 MG 24 hr tablet Take 120 mg by mouth Daily.    ? levothyroxine (SYNTHROID) 75 MCG tablet Take 75 mcg by mouth daily.   ? losartan (COZAAR) 100 MG tablet Take 100 mg by mouth daily.   ? traZODone (DESYREL) 150 MG tablet Take 1.5 tablets (225 mg total) by mouth at bedtime. Taking 1 & 1/2 tablet at bedtime 08/19/2021: 08/19/21:Daughter reports taking one half of one tablet qHS  ? venlafaxine XR (EFFEXOR-XR) 150 MG 24 hr capsule Take 1 capsule (150 mg total) by mouth daily with breakfast.   ? ?No facility-administered encounter medications on file as of 11/10/2021.  ? ?Patient Active Problem List  ? Diagnosis Date Noted  ? Encounter for general adult medical examination with abnormal findings 07/30/2021  ? Ataxia 07/28/2021  ? Frequent falls 07/28/2021  ? Permanent atrial fibrillation ([place]) 01/26/2021  ? Stage 3b chronic kidney disease ([place]) 01/26/2021  ? Type II diabetes mellitus ([place])   ? Depression with anxiety 06/25/2020  ? Chronic anticoagulation 11/08/2019  ? Sleep apnea 11/08/2019  ?  PAF (paroxysmal atrial fibrillation) ([place]) 12/20/2018  ? Primary localized osteoarthritis of right knee 09/20/2016  ? Primary osteoarthritis of right knee 09/20/2016  ? Primary osteoarthritis of left knee 10/13/2015  ? Radiculopathy 07/08/2015  ? 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 Chronic diastolic HF (heart failure) ([place]) 07/03/2009  ? Aortic valve disorder 06/17/2009  ?  Hypothyroidism 07/28/2008  ? Dyslipidemia, goal LDL below 70 07/28/2008  ? Essential hypertension 07/28/2008  ? ?Conditions to be addressed/monitored:  Atrial Fibrillation, HTN, and falls ? ?Care Plan : RN Care Manager Plan of Care  ?Updates made by [name], RN since 11/10/2021 12:00 AM  ?  ? ?Problem: Chronic Disease Management Needs   ?Priority: High  ?  ? ?Long-Range Goal: Ongoing adherence to established plan of care for long term chronic disease management   ?Start Date: 08/19/2021  ?Expected End Date: 08/19/2022  ?Priority: High  ?Note:   ?Current Barriers:  ?Chronic Disease Management support and education needs related to Atrial Fibrillation, HTN, and frequent falls ?Non-adherence to prescribed medication regimen ?Ongoing depression ?Multiple falls- patient/ caregiver report 6-plus falls, without serious injury, over last 12 months: patient uses cane/ walker as needed ?09/27/21-- reports only one fall without injury since last outreach 08/19/21-- last fall occurred 08/20/21- so pateint has been fall-free x 5 weeks; continues using cane/ walker "occasionally" ?11/10/21-- daughter reports patient falls (without injury) x "about 2" since our last outreach on 09/27/21, as reported to CCM CSW 10/11/21; today 11/10/21, caregiver daughter reports no "known" falls (daughter works outside of home) "for about 4 weeks;" reports patient continues to use cane and/ or walker "most of the time" ?Memory issues reported by caregiver/ patient ?Need for in-home care assistance: CCM CSW team active; working to obtain PCS through patient's medicaid benefits ?Confirmed through review of CCM CSW notes that patient does not qualify for medicaid/ PCS ?Caregiver reports 09/27/21 that they are not able to afford private duty in-home care assistance ?Caregiver reports 09/27/21 that patient is not interested/ willing to go to [facility]: states pateint sleeps too late in day, and daughter would not be able to provide transportation  due to work hours  ?11/10/21: patient reported to CCM CSW 10/11/21 that she had reconsidered option of [facility], and that she was "now willing" to attend ?11/10/21: daughter/ caregiver reports patient is NOT willing to attend, speculates that patient said she would go on 10/11/21, "because that is what she knew we wanted her to say;"- will update CCM CSW as FYI ?11/10/21: caregiver tells me about a conversation that patient brought up with her recently: patient told caregiver/ daughter that she "would be better off" living in a facility-- daughter stated that she agreed with patient and they discussed benefits of having medications provided at regular intervals when they are due, increased socialization, assistance with care needs such as bathing: daughter is willing to discuss this conversation with CCM CSW when she and patient have next telephone appointment 11/15/21-- I will update CCM CSW accordingly  ?09/27/21- Caregiver confirms they have spoken to [facility] for transportation resources: states "they don't come [place] we [place] ? ?RNCM Clinical Goal(s):  ?Patient will demonstrate improved health management independence as evidenced by adherence to plan of care for medication regimen, AF/ HTN, fall prevention        through collaboration with RN Care manager, provider, and care team.  ? ?Interventions: ?1:1 collaboration with primary care  provider regarding development and update of comprehensive plan of care as evidenced by provider attestation and co-signature ?Inter-disciplinary care team collaboration (see longitudinal plan of care) ?Evaluation of current treatment plan related to  self management and patient's adherence to plan as established by provider ?Initial assessment completed 08/19/21 ?11/10/21: ?Review of patient status, including review of consultants reports, relevant laboratory and other test results, and medications completed ?SDOH updated: no  new/ unmet concerns identified ?Falls assessment updated:  ?daughter reports patient falls (without injury) x "about 2" since our last outreach on 09/27/21, as reported to CCM CSW 10/11/21; today 11/10/21

## 2021-11-15 ENCOUNTER — Encounter: Payer: Self-pay | Admitting: Licensed Clinical Social Worker

## 2021-11-15 ENCOUNTER — Ambulatory Visit (INDEPENDENT_AMBULATORY_CARE_PROVIDER_SITE_OTHER): Payer: Medicare Other | Admitting: Licensed Clinical Social Worker

## 2021-11-15 DIAGNOSIS — F418 Other specified anxiety disorders: Secondary | ICD-10-CM

## 2021-11-15 DIAGNOSIS — Z741 Need for assistance with personal care: Secondary | ICD-10-CM

## 2021-11-15 DIAGNOSIS — I10 Essential (primary) hypertension: Secondary | ICD-10-CM

## 2021-11-15 NOTE — Patient Instructions (Addendum)
Visit Information ? ?Thank you for taking time to visit with me today. Please don't hesitate to contact me if I can be of assistance to you before our next scheduled telephone appointment. ? ?Following are the goals we discussed today: support for level of care needs ?Patient Self-Care Activities: ?Make appointment with Dr. Ronnald Ramp for a face to face to move forward with  Home health for PT and OT ?Review Assisted living information mailed ( ALF list, SA brochure, When a loved one needs long-term care) ?Call [place]  [phone] or State number [phone]  to inquire about the in-home aide program  ?Call [facility] [phone] to inquire about ?Meals on wheels ?Adult Day programs  ? ?Our next appointment is by telephone on April 17th at 12:00 ? ?Please call the care guide team at [phone] if you need to cancel or reschedule your appointment.  ? ?If you are experiencing a Mental Health or [place] or need someone to talk to, please call 1-800-273-TALK (toll free, 24 hour hotline)  ? ?The patient's daughter verbalized understanding of instructions, educational materials, and care plan provided today and agreed to receive a mailed copy of patient instructions, educational materials, and care plan.  ? ?Casimer Lanius, LCSW ?Licensed Clinical Social Worker [facility]  ?[place]  ?[phone]  ?

## 2021-11-15 NOTE — Chronic Care Management (AMB) (Signed)
?Chronic Care Management  ? Clinical Social Work Note ? ?11/15/2021 ?Name: [name] [name] MRN: [id number] DOB: 1933/08/30 ? ?[name] [name] is a 88 y.o. year old female who is a primary care patient of [name], MD. The CCM team was consulted to assist the patient with chronic disease management and/or care coordination needs related to: Level of Care Concerns and Caregiver Stress.  ? ?Collaboration with patient's daughter  for follow up visit in response to provider referral for social work chronic care management and care coordination services.  ? ?Consent to Services:  ?The patient was given information about Chronic Care Management services, agreed to services, and gave verbal consent prior to initiation of services.  Please see initial visit note for detailed documentation.  ? ?Patient's daughter agreed to services and consent obtained.  ? ?Summary: Patient's daughter [name] [name] provided all information during this encounter. Patient was sleep. Patient continues to experience difficulty with decline in ADL's.  Patient, CCM RN and daughter discussed the possibility of exploring Assisted living options..  See Care Plan below for interventions and patient self-care actives. ? ?Recommendation: Patient may benefit from, and daughter is in agreement for LCSW to mail assisted living information for them to review..  ? ?Follow up Plan: Patient's caregiver would like continued follow-up from CCM LCSW .  per caregiver's request CCM LCSW will follow up in 2 weeks.  They will call the office if needed prior to next encounter. ?  ?Assessment: Review of patient past medical history, allergies, medications, and health status, including review of relevant consultants reports was performed today as part of a comprehensive evaluation and provision of chronic care management and care coordination services.    ? ?SDOH (Social Determinants of Health) assessments and interventions performed:   ? ?Advanced Directives Status: Not addressed  in this encounter. ? ?CCM Care Plan ?Conditions to be addressed/monitored: ; Level of care concerns ? ?Care Plan : LCSW Plan of Care  ?Updates made by Maurine Cane, LCSW since 11/15/2021 12:00 AM  ?  ? ?Problem: Social and Functional decline   ?  ? ?Long-Range Goal: Social and Functional Skills enhanced   ?Start Date: 08/17/2021  ?This Visit's Progress: Not on track  ?Recent Progress: On track  ?Priority: High  ?Note:   ?Current Barriers:  ?Social Isolation  ?Level of Care Concerns:Inability to perform ADL's independently ?Does not have Medicaid and will not qualify for personal care services ?CSW Clinical Goal(s):  ?Patient  and Caregiver  will work with agencies discussed to address needs related to level of care and social isolation  through collaboration with [place] representative, provider, and care team.  ? ?Interventions: ?1:1 collaboration with primary care provider regarding development and update of comprehensive plan of care as evidenced by provider attestation and co-signature ?Inter-disciplinary care team collaboration (see longitudinal plan of care) ?Evaluation of current treatment plan related to  self management and patient's adherence to plan as established by provider ?Review resources, discussed options and provided patient information about  ?Department of Social Services ( in-home aide program ) ?Services provided by [employer] (Day programs and meals on wheels) ? ?Mental Health:  (Status: New goal.) ?Evaluation of current treatment plan related to Caregiver Stress ?Solution-Focused Strategies employed:  ?Active listening / Reflection utilized  ?Emotional Support Provided ?Problem Solving /Task Center strategies reviewed ?Caregiver stress acknowledged  ?Discussed caregiver resources and support:   ?  ?Level of Care Concerns in a patient with CKD Stage 3 and Depression:  (Status:  Goal on Track (progressing): YES.) ?Current level of care: home with other family or significant other(s):  family member: Daughter [name] [name] ?Evaluation of patient safety in current living environment ?Solution-Focused Strategies employed:  ?Problem Solving /Task Center strategies [facility]  ?Assessed needs and reviewed facility placement process; as well as the different levels of care, Provided a list of facilities based on level of care needs, Discussed payment options for placement :Assisted living, Discussed Special Assistance Medicaid , and Provided educational information "When a loved one needs Long-term Care"  ? ?Patient Self-Care Activities: ?Make appointment with Dr. Ronnald Ramp for a face to face to move forward with  Home health for PT and OT ?Review Assisted living information mailed ( ALF list, SA brochure, When a loved one needs long-term care) ?Call [place]  [phone] or State number [phone]  to inquire about the in-home aide program  ?Call [facility] [phone] to inquire about ?Meals on wheels ?Adult Day programs  ?  ?  ?Casimer Lanius, LCSW ?Licensed Clinical Social Worker [facility]  ?[place]  ?[phone]  ? ?

## 2021-11-21 ENCOUNTER — Other Ambulatory Visit: Payer: Self-pay | Admitting: Internal Medicine

## 2021-11-21 DIAGNOSIS — E1122 Type 2 diabetes mellitus with diabetic chronic kidney disease: Secondary | ICD-10-CM

## 2021-11-21 DIAGNOSIS — N1832 Chronic kidney disease, stage 3b: Secondary | ICD-10-CM

## 2021-11-26 ENCOUNTER — Other Ambulatory Visit: Payer: Self-pay

## 2021-11-26 DIAGNOSIS — I4821 Permanent atrial fibrillation: Secondary | ICD-10-CM

## 2021-11-26 MED ORDER — APIXABAN 5 MG PO TABS: 5.0000 mg | ORAL_TABLET | Freq: Two times a day (BID) | ORAL | 0 refills | Status: DC

## 2021-11-26 NOTE — Telephone Encounter (Signed)
Prescription refill request for Eliquis received. ?Indication:Afib ?Last office visit:Needs visit ?Scr:0.8 ?Age: 88 ?Weight:79.4 kg ? ?Prescription refilled ? ?

## 2021-11-29 ENCOUNTER — Telehealth: Payer: Medicare Other

## 2021-11-29 ENCOUNTER — Encounter: Payer: Self-pay | Admitting: Licensed Clinical Social Worker

## 2021-11-29 ENCOUNTER — Ambulatory Visit: Payer: Medicare Other | Admitting: Licensed Clinical Social Worker

## 2021-11-29 DIAGNOSIS — F418 Other specified anxiety disorders: Secondary | ICD-10-CM

## 2021-11-29 DIAGNOSIS — Z741 Need for assistance with personal care: Secondary | ICD-10-CM

## 2021-11-29 DIAGNOSIS — I10 Essential (primary) hypertension: Secondary | ICD-10-CM

## 2021-11-29 NOTE — Chronic Care Management (AMB) (Signed)
?Chronic Care Management  ? Clinical Social Work Note ? ?11/29/2021 ?Name: [name] [name] MRN: [id number] DOB: [birth year] ? ?[name] [name] is a 88 y.o. year old female who is a primary care patient of [name], MD. The CCM team was consulted to assist the patient with chronic disease management and/or care coordination needs related to: Level of Care Concerns.  ? ?Engaged with patient by telephone for follow up visit in response to provider referral for social work chronic care management and care coordination services.  ? ?Consent to Services:  ?The patient was given information about Chronic Care Management services, agreed to services, and gave verbal consent prior to initiation of services.  Please see initial visit note for detailed documentation.  ? ?Patient agreed to services and consent obtained.  ? ?Summary: Patient was accompanied by her daughter [name] [name] who provided information during this encounter.They are not making progress with level of care needs.  Patient reports not wanting to go live in assisted living.  Time spent utilizing motivational interviewing on levels of care. Will discuss [facility] at next office visit with provider.  See Care Plan below for interventions and patient self-care actives. ? ?Recommendation: Patient may benefit from, and is in agreement to go visit 2 assisted living facilities if her daughter will take her.  ? ?Follow up Plan: No follow up scheduled with CCM LCSW at this time. Will follow up in 3 to 4 weeks per patients request to schedule a follow up call .They will call office if needed prior to next encounter. ?  ?Assessment: Review of patient past medical history, allergies, medications, and health status, including review of relevant consultants reports was performed today as part of a comprehensive evaluation and provision of chronic care management and care coordination services.    ? ?SDOH (Social Determinants of Health) assessments and interventions performed:    ? ?Advanced Directives Status: Not addressed in this encounter. ? ?CCM Care Plan ?Conditions to be addressed/monitored: Anxiety and Depression; Level of care concerns ? ?Care Plan : LCSW Plan of Care  ?Updates made by Maurine Cane, LCSW since 11/29/2021 12:00 AM  ?  ? ?Problem: Social and Functional decline   ?  ? ?Long-Range Goal: Social and Functional Skills enhanced   ?Start Date: 08/17/2021  ?This Visit's Progress: On track  ?Recent Progress: Not on track  ?Priority: High  ?Note:   ?Current Barriers:  ?Social Isolation  ?Level of Care Concerns:Inability to perform ADL's independently ? ?CSW Clinical Goal(s):  ?Patient  and Caregiver  will work with agencies discussed to address needs related to level of care and social isolation  through collaboration with [facility] representative, provider, and care team.  ? ?Interventions: ?1:1 collaboration with primary care provider regarding development and update of comprehensive plan of care as evidenced by provider attestation and co-signature ?Inter-disciplinary care team collaboration (see longitudinal plan of care) ?Evaluation of current treatment plan related to  self management and patient's adherence to plan as established by provider ?Review resources, discussed options and provided patient information about  ?Department of Social Services ( in-home aide program ) ?Services provided by [employer] (Day programs and meals on wheels) ? ?Mental Health:  (Status: Goal on Track (progressing): YES.) ?Evaluation of current treatment plan related to Caregiver Stress ?Solution-Focused Strategies employed:  ?Active listening / Reflection utilized  ?Problem Solving /Task Center strategies reviewed ?  ?Level of Care Concerns in a patient with CKD Stage 3 and Depression:  (Status: Goal on  Track (progressing): YES.) ?Current level of care: home with other family or significant other(s): family member: Daughter [name] [name] ?Evaluation of patient safety in current living  environment ?Motivational Interviewing employed ?Solution-Focused Strategies employed:  ?Active listening / Reflection utilized  ?Problem Solving /Task Center strategies [facility]  ?Assessed needs and reviewed facility placement process; as well as the different levels of care, Provided a list of facilities based on level of care needs, Discussed payment options for placement :Assisted living, Discussed Special Assistance Medicaid , and Provided educational information "When a loved one needs Long-term Care"  ? ?Patient Self-Care Activities: ?Keep appointment with Dr. Ronnald Ramp for a face to face to move forward with  Home health for PT and OT ?Review Assisted living information mailed ( ALF list, SA brochure, When a loved one needs long-term care) ?Go Visit Assisted Living Facilities  ?Call [facility]  [phone]  to inquire about the in-home aide program  ?Call [facility] [phone] to inquire about ?Meals on wheels ?Adult Day programs  ?  ?  ?Casimer Lanius, LCSW ?Licensed Clinical Social Worker [employer]  ?[place]  ?[phone]  ? ?

## 2021-11-29 NOTE — Patient Instructions (Addendum)
Visit Information ? ?Thank you for taking time to visit with me today. Please don't hesitate to contact me if I can be of assistance to you before our next scheduled telephone appointment. ? ?Following are the goals we discussed today: Levels of Care ?Patient Self-Care Activities: ?Keep appointment with Dr. Ronnald Ramp for a face to face to move forward with  Home health for PT and OT ?Review Assisted living information mailed ( ALF list, SA brochure, When a loved one needs long-term care) ?Go Visit Assisted Living Facilities  ?Call [facility]  [phone]  to inquire about the in-home aide program  ?Call [facility] [phone] to inquire about ?Meals on wheels ?Adult Day programs  ? ?Per your request we did not schedule a follow up call.  I will have the care guide contact you in 3 to 4  weeks to schedule the f/u call. ?Please call the care guide team at [phone] if you need schedule an appointment before they contact you.  ? ?If you are experiencing a Mental Health or [place] or need someone to talk to, please call 1-800-273-TALK (toll free, 24 hour hotline)  ? ?The patient verbalized understanding of instructions, educational materials, and care plan provided today and agreed to receive a mailed copy of patient instructions, educational materials, and care plan.  ? ?Casimer Lanius, LCSW ?Licensed Clinical Social Worker [employer]  ?[place]  ?[phone]  ?

## 2021-12-12 DIAGNOSIS — I10 Essential (primary) hypertension: Secondary | ICD-10-CM

## 2021-12-12 DIAGNOSIS — F418 Other specified anxiety disorders: Secondary | ICD-10-CM | POA: Diagnosis not present

## 2021-12-13 ENCOUNTER — Telehealth: Payer: Self-pay

## 2021-12-13 NOTE — Progress Notes (Signed)
? ? ?  Chronic Care Management ?Pharmacy Assistant  ? ?Name: [name] [name]  MRN: [id number] DOB: 1934-04-18 ? ?[name] [name] is an 88 y.o. year old female who presents for his initial CCM visit with the clinical pharmacist. ? ? ?Recent office visits:  ?07/28/21 [name], MD-PCP (Afib,HTN, Hypo, Diabetic) Blood work ordered and referral to [facility];No medication [place] ? ?Recent consult visits:  ?06/23/21 Glennon Mac, DO-Sports Medicine (Acute bilateral low back pain without sciatica)  ? ?Hospital visits:  ?None in previous 6 months ? ?Medications: ?Outpatient Encounter Medications as of 12/13/2021  ?Medication Sig Note  ? acetaminophen (TYLENOL) 650 MG CR tablet Take 1,300 mg by mouth every 8 (eight) hours as needed for pain.   ? apixaban (ELIQUIS) 5 MG TABS tablet Take 1 tablet (5 mg total) by mouth 2 (two) times daily. NEEDS CARDIOLOGY VISIT, CALL OFFICE ASAP   ? atorvastatin (LIPITOR) 40 MG tablet Take 1 tablet (40 mg total) by mouth daily.   ? diltiazem (CARDIZEM CD) 180 MG 24 hr capsule Take 1 capsule (180 mg total) by mouth daily.   ? FARXIGA 10 MG TABS tablet TAKE 1 TABLET BY MOUTH DAILY BEFORE BREAKFAST.   ? furosemide (LASIX) 40 MG tablet Take 1 tablet (40 mg total) by mouth daily.   ? hydrochlorothiazide (MICROZIDE) 12.5 MG capsule Take 12.5 mg by mouth daily.   ? isosorbide mononitrate (IMDUR) 120 MG 24 hr tablet Take 120 mg by mouth Daily.    ? levothyroxine (SYNTHROID) 75 MCG tablet Take 75 mcg by mouth daily.   ? losartan (COZAAR) 100 MG tablet Take 100 mg by mouth daily.   ? traZODone (DESYREL) 150 MG tablet Take 1.5 tablets (225 mg total) by mouth at bedtime. Taking 1 & 1/2 tablet at bedtime 08/19/2021: 08/19/21:Daughter reports taking one half of one tablet qHS  ? venlafaxine XR (EFFEXOR-XR) 150 MG 24 hr capsule Take 1 capsule (150 mg total) by mouth daily with breakfast.   ? ?No facility-administered encounter medications on file as of 12/13/2021.  ? ?Medication  List: ?acetaminophen (TYLENOL) 650 MG CR tablet ?apixaban (ELIQUIS) 5 MG TABS tablet-last fill 10/05/21 30 ds ?atorvastatin (LIPITOR) 40 MG tablet-last fill 11/26/21 30 ds ?diltiazem (CARDIZEM CD) 180 MG 24 hr capsule-last fill 11/26/21 30 ds ?FARXIGA 10 MG TABS tablet-last fill 11/21/21 90 ds ?furosemide (LASIX) 40 MG tablet (Expired) ?hydrochlorothiazide (MICROZIDE) 12.5 MG capsule ?isosorbide mononitrate (IMDUR) 120 MG 24 hr tablet-last fill 04/22/20 90 ds ?levothyroxine (SYNTHROID) 75 MCG tablet-last fill 06/18/20 90 ds ?losartan (COZAAR) 100 MG tablet-last fill 04/22/20 90 ds ?traZODone (DESYREL) 150 MG tablet-last fill 07/30/21 30 ds ?venlafaxine XR (EFFEXOR-XR) 150 MG 24 hr capsule-last fill 07/30/21 30 ds ? ? ?Care Gaps: ?Colonoscopy-NA ?Diabetic Foot Exam-NA ?Mammogram-NA ?Ophthalmology-NA ?Dexa Scan - NA ?Annual Well Visit - NA ?Micro albumin-NA ?Hemoglobin A1c- 07/28/21 ? ?Star Rating Drugs: ?Farxiga 10 mg-last fill 11/21/21 90 ds ?Atorvastatin 40 mg-last fill 11/26/21 30 ds ?Losartan 100 mg-last fill 04/22/20 90 ds ? ?Ethelene Hal ?Clinical Pharmacist Assistant ?[phone]  ?

## 2021-12-14 ENCOUNTER — Ambulatory Visit (INDEPENDENT_AMBULATORY_CARE_PROVIDER_SITE_OTHER): Payer: Medicare HMO | Admitting: Internal Medicine

## 2021-12-14 ENCOUNTER — Encounter: Payer: Self-pay | Admitting: Internal Medicine

## 2021-12-14 VITALS — BP 118/76 | HR 120 | Temp 98.1°F | Resp 16 | Ht 62.0 in | Wt 182.0 lb

## 2021-12-14 DIAGNOSIS — E876 Hypokalemia: Secondary | ICD-10-CM | POA: Diagnosis not present

## 2021-12-14 DIAGNOSIS — N182 Chronic kidney disease, stage 2 (mild): Secondary | ICD-10-CM | POA: Diagnosis not present

## 2021-12-14 DIAGNOSIS — T502X5A Adverse effect of carbonic-anhydrase inhibitors, benzothiadiazides and other diuretics, initial encounter: Secondary | ICD-10-CM

## 2021-12-14 DIAGNOSIS — E039 Hypothyroidism, unspecified: Secondary | ICD-10-CM

## 2021-12-14 DIAGNOSIS — I10 Essential (primary) hypertension: Secondary | ICD-10-CM

## 2021-12-14 DIAGNOSIS — I4821 Permanent atrial fibrillation: Secondary | ICD-10-CM

## 2021-12-14 DIAGNOSIS — E1122 Type 2 diabetes mellitus with diabetic chronic kidney disease: Secondary | ICD-10-CM | POA: Diagnosis not present

## 2021-12-14 DIAGNOSIS — N1832 Chronic kidney disease, stage 3b: Secondary | ICD-10-CM | POA: Diagnosis not present

## 2021-12-14 DIAGNOSIS — I5032 Chronic diastolic (congestive) heart failure: Secondary | ICD-10-CM

## 2021-12-14 LAB — BASIC METABOLIC PANEL
BUN: 16 mg/dL (ref 6–23)
CO2: 30 mEq/L (ref 19–32)
Calcium: 9.6 mg/dL (ref 8.4–10.5)
Chloride: 100 mEq/L (ref 96–112)
Creatinine, Ser: 1.14 mg/dL (ref 0.40–1.20)
GFR: 43.68 mL/min — ABNORMAL LOW (ref >60.00)
Glucose, Bld: 88 mg/dL (ref 70–99)
Potassium: 3.3 mEq/L — ABNORMAL LOW (ref 3.5–5.1)
Sodium: 138 mEq/L (ref 135–145)

## 2021-12-14 LAB — TSH: TSH: 2.88 u[IU]/mL (ref 0.35–5.50)

## 2021-12-14 MED ORDER — DAPAGLIFLOZIN PROPANEDIOL 10 MG PO TABS: 10.0000 mg | ORAL_TABLET | Freq: Every day | ORAL | 1 refills | Status: DC

## 2021-12-14 MED ORDER — FUROSEMIDE 40 MG PO TABS: 40.0000 mg | ORAL_TABLET | Freq: Every day | ORAL | 1 refills | Status: DC

## 2021-12-14 MED ORDER — DILTIAZEM HCL ER COATED BEADS 300 MG PO CP24: 300.0000 mg | ORAL_CAPSULE | Freq: Every day | ORAL | 1 refills | Status: DC

## 2021-12-14 NOTE — Progress Notes (Signed)
? ?Subjective:  ?Patient ID: [name] [name], female    DOB: [birth year]  Age: 88 y.o. MRN: [id number] ? ?CC: Atrial Fibrillation ? ? ?HPI ?[name] [name] presents for f/up- ? ?She complains of weight gain but denies chest pain, shortness of breath, palpitations, dizziness, lightheadedness, or near syncope. ? ?Outpatient Medications Prior to Visit  ?Medication Sig Dispense Refill  ? acetaminophen (TYLENOL) 650 MG CR tablet Take 1,300 mg by mouth every 8 (eight) hours as needed for pain.    ? apixaban (ELIQUIS) 5 MG TABS tablet Take 1 tablet (5 mg total) by mouth 2 (two) times daily. NEEDS CARDIOLOGY VISIT, CALL OFFICE ASAP 30 tablet 0  ? atorvastatin (LIPITOR) 40 MG tablet Take 1 tablet (40 mg total) by mouth daily. 90 tablet 1  ? isosorbide mononitrate (IMDUR) 120 MG 24 hr tablet Take 120 mg by mouth Daily.     ? levothyroxine (SYNTHROID) 75 MCG tablet Take 75 mcg by mouth daily.    ? losartan (COZAAR) 100 MG tablet Take 100 mg by mouth daily.    ? traZODone (DESYREL) 150 MG tablet Take 1.5 tablets (225 mg total) by mouth at bedtime. Taking 1 & 1/2 tablet at bedtime 135 tablet 1  ? venlafaxine XR (EFFEXOR-XR) 150 MG 24 hr capsule Take 1 capsule (150 mg total) by mouth daily with breakfast. 90 capsule 1  ? diltiazem (CARDIZEM CD) 180 MG 24 hr capsule Take 1 capsule (180 mg total) by mouth daily. 90 capsule 1  ? FARXIGA 10 MG TABS tablet TAKE 1 TABLET BY MOUTH DAILY BEFORE BREAKFAST. 90 tablet 1  ? hydrochlorothiazide (MICROZIDE) 12.5 MG capsule Take 12.5 mg by mouth daily.    ? furosemide (LASIX) 40 MG tablet Take 1 tablet (40 mg total) by mouth daily. 30 tablet 3  ? ?No facility-administered medications prior to visit.  ? ? ?ROS ?Review of Systems  ?Constitutional:  Positive for unexpected weight change. Negative for chills, diaphoresis and fatigue.  ?HENT: Negative.    ?Eyes: Negative.   ?Respiratory:  Negative for cough, chest tightness, shortness of breath and wheezing.   ?Cardiovascular:  Negative for chest pain,  palpitations and leg swelling.  ?Gastrointestinal:  Negative for abdominal pain, constipation, diarrhea, nausea and vomiting.  ?Endocrine: Negative.   ?Genitourinary: Negative.  Negative for difficulty urinating.  ?Musculoskeletal: Negative.   ?Skin: Negative.   ?Neurological:  Negative for dizziness, weakness and light-headedness.  ?Hematological:  Negative for adenopathy. Does not bruise/bleed easily.  ?Psychiatric/Behavioral: Negative.    ? ?Objective:  ?BP 118/76 (BP Location: Left Arm, Patient Position: Sitting, Cuff Size: Large)   Pulse (!) 120   Temp 98.1 ?F (36.7 ?C) (Oral)   Resp 16   Ht '5\' 2"'$  (1.575 m)   Wt 182 lb (82.6 kg)   SpO2 94%   BMI 33.29 kg/m?  ? ?BP Readings from Last 3 Encounters:  ?12/14/21 118/76  ?07/28/21 (!) 146/98  ?06/23/21 (!) 160/100  ? ? ?Wt Readings from Last 3 Encounters:  ?12/14/21 182 lb (82.6 kg)  ?09/30/21 175 lb (79.4 kg)  ?07/28/21 188 lb (85.3 kg)  ? ? ?Physical Exam ?Vitals reviewed.  ?HENT:  ?   Nose: Nose normal.  ?   Mouth/Throat:  ?   Mouth: Mucous membranes are moist.  ?Eyes:  ?   General: No scleral icterus. ?   Conjunctiva/sclera: Conjunctivae normal.  ?Cardiovascular:  ?   Rate and Rhythm: Tachycardia present. Rhythm irregularly irregular.  ?   Heart sounds: No murmur heard. ?  Comments: EKG- ?A fib with RVR/PVC, 119 bpm ?RBBB ?Lateral infarct pattern is not new ?Pulmonary:  ?   Effort: Pulmonary effort is normal.  ?   Breath sounds: No stridor. No wheezing, rhonchi or rales.  ?Abdominal:  ?   General: Abdomen is flat.  ?   Palpations: There is no mass.  ?   Tenderness: There is no abdominal tenderness. There is no guarding.  ?   Hernia: No hernia is present.  ?Musculoskeletal:  ?   Right lower leg: No edema.  ?   Left lower leg: No edema.  ?Skin: ?   General: Skin is warm and dry.  ?   Findings: No rash.  ?Neurological:  ?   General: No focal deficit present.  ?   Mental Status: She is alert.  ?Psychiatric:     ?   Mood and Affect: Mood normal.     ?    Behavior: Behavior normal.  ? ? ?Lab Results  ?Component Value Date  ? WBC 8.6 07/28/2021  ? HGB 15.9 (H) 07/28/2021  ? HCT 47.7 (H) 07/28/2021  ? PLT 306.0 07/28/2021  ? GLUCOSE 88 12/14/2021  ? CHOL 156 01/26/2021  ? TRIG 220.0 (H) 01/26/2021  ? HDL 53.30 01/26/2021  ? LDLDIRECT 87.0 01/26/2021  ? LDLCALC 127 (H) 02/12/2020  ? ALT 23 06/25/2020  ? AST 26 06/25/2020  ? NA 138 12/14/2021  ? K 3.3 (L) 12/14/2021  ? CL 100 12/14/2021  ? CREATININE 1.14 12/14/2021  ? BUN 16 12/14/2021  ? CO2 30 12/14/2021  ? TSH 2.88 12/14/2021  ? INR 0.96 09/13/2016  ? HGBA1C 6.0 12/14/2021  ? MICROALBUR 5.9 (H) 07/28/2021  ? ? ?MR Brain Wo Contrast ? ?Result Date: 09/06/2021 ?CLINICAL DATA:  Ataxia, nontraumatic, stroke excluded. Forgetfulness and confusion. EXAM: MRI HEAD WITHOUT CONTRAST TECHNIQUE: Multiplanar, multiecho pulse sequences of the brain and surrounding structures were obtained without intravenous contrast. COMPARISON:  None. FINDINGS: Brain: Diffusion imaging does not show any acute or subacute infarction. Chronic small-vessel ischemic changes affect the pons. No focal cerebellar finding. 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 No cortical or large vessel territory infarction. No mass lesion, hemorrhage, hydrocephalus or extra-axial collection. Vascular: Major vessels at the base of the brain show flow. Skull and upper cervical spine: Negative Sinuses/Orbits: Clear/normal Other: None IMPRESSION: No acute or reversible finding. Chronic small-vessel ischemic changes of the pons, thalami, basal ganglia and cerebral hemispheric white matter. Electronically Signed   By: Nelson Chimes M.D.   On: 09/06/2021 14:43  ? ? ?Assessment & Plan:  ? ?[name] [name] was seen today for atrial fibrillation. ? ?Diagnoses and all orders for this visit: ? ?Essential hypertension- Her blood pressure is well controlled. ?-      EKG 12-Lead ?-     TSH; Future ?-     TSH ?-     potassium chloride SA (KLOR-CON M15) 15 MEQ tablet; Take 1 tablet (15 mEq total) by mouth 2 (two) times daily. ? ?Type 2 diabetes mellitus with stage 2 chronic kidney disease, without long-term current use of insulin ([place])- Her blood sugar is well controlled. ?-     dapagliflozin propanediol (FARXIGA) 10 MG TABS tablet; Take 1 tablet (10 mg total) by mouth daily before breakfast. ?-     Hemoglobin A1c; Future ?-     Hemoglobin A1c ? ?Stage 3b chronic kidney disease ([place]) ?-  dapagliflozin propanediol (FARXIGA) 10 MG TABS tablet; Take 1 tablet (10 mg total) by mouth daily before breakfast. ?-     Basic metabolic panel; Future ?-     Basic metabolic panel ? ?Chronic diastolic HF (heart failure) (H[facility]) ?-     dapagliflozin propanediol (FARXIGA) 10 MG TABS tablet; Take 1 tablet (10 mg total) by mouth daily before breakfast. ?-     furosemide (LASIX) 40 MG tablet; Take 1 tablet (40 mg total) by mouth daily. ? ?Permanent atrial fibrillation ([place])- She does not have good rate and rhythm control.  Will increase the dose of the CCB. ?-     diltiazem (CARDIZEM CD) 300 MG 24 hr capsule; Take 1 capsule (300 mg total) by mouth daily. ?-     TSH; Future ?-     TSH ? ?Acquired hypothyroidism ? ?Diuretic-induced hypokalemia ?-     potassium chloride SA (KLOR-CON M15) 15 MEQ tablet; Take 1 tablet (15 mEq total) by mouth 2 (two) times daily. ? ? ?I have discontinued [name] Tamm. Hockley "Faye"'s hydrochlorothiazide and diltiazem. I have also changed her [name] [name] to dapagliflozin propanediol. Additionally, I am having her start on diltiazem and potassium chloride SA. Lastly, I am having her maintain her isosorbide mononitrate, levothyroxine, acetaminophen, losartan, traZODone, venlafaxine XR, atorvastatin, apixaban, and furosemide. ? ?Meds ordered this encounter  ?Medications  ? dapagliflozin propanediol (FARXIGA) 10 MG TABS tablet  ?  Sig: Take 1 tablet (10 mg total) by mouth daily before  breakfast.  ?  Dispense:  90 tablet  ?  Refill:  1  ? furosemide (LASIX) 40 MG tablet  ?  Sig: Take 1 tablet (40 mg total) by mouth daily.  ?  Dispense:  90 tablet  ?  Refill:  1  ? diltiazem (CARDIZEM CD) 300 MG 24

## 2021-12-14 NOTE — Patient Instructions (Signed)
Atrial Fibrillation  Atrial fibrillation is a type of irregular or rapid heartbeat (arrhythmia). In atrial fibrillation, the top part of the heart (atria) beats in an irregular pattern. This makes the heart unable to pump blood normally and effectively. The goal of treatment is to prevent blood clots from forming, control your heart rate, or restore your heartbeat to a normal rhythm. If this condition is not treated, it can cause serious problems, such as a weakened heart muscle (cardiomyopathy) or a stroke. What are the causes? This condition is often caused by medical conditions that damage the heart's electrical system. These include: High blood pressure (hypertension). This is the most common cause. Certain heart problems or conditions, such as heart failure, coronary artery disease, heart valve problems, or heart surgery. Diabetes. Overactive thyroid (hyperthyroidism). Obesity. Chronic kidney disease. In some cases, the cause of this condition is not known. What increases the risk? This condition is more likely to develop in: Older people. People who smoke. Athletes who do endurance exercise. People who have a family history of atrial fibrillation. Men. People who use drugs. People who drink a lot of alcohol. People who have lung conditions, such as emphysema, pneumonia, or COPD. People who have obstructive sleep apnea. What are the signs or symptoms? Symptoms of this condition include: A feeling that your heart is racing or beating irregularly. Discomfort or pain in your chest. Shortness of breath. Sudden light-headedness or weakness. Tiring easily during exercise or activity. Fatigue. Syncope (fainting). Sweating. In some cases, there are no symptoms. How is this diagnosed? Your health care provider may detect atrial fibrillation when taking your pulse. If detected, this condition may be diagnosed with: An electrocardiogram (ECG) to check electrical signals of the  heart. An ambulatory cardiac monitor to record your heart's activity for a few days. A transthoracic echocardiogram (TTE) to create pictures of your heart. A transesophageal echocardiogram (TEE) to create even closer pictures of your heart. A stress test to check your blood supply while you exercise. Imaging tests, such as a CT scan or chest X-ray. Blood tests. How is this treated? Treatment depends on underlying conditions and how you feel when you experience atrial fibrillation. This condition may be treated with: Medicines to prevent blood clots or to treat heart rate or heart rhythm problems. Electrical cardioversion to reset the heart's rhythm. A pacemaker to correct abnormal heart rhythm. Ablation to remove the heart tissue that sends abnormal signals. Left atrial appendage closure to seal the area where blood clots can form. In some cases, underlying conditions will be treated. Follow these instructions at home: Medicines Take over-the counter and prescription medicines only as told by your health care provider. Do not take any new medicines without talking to your health care provider. If you are taking blood thinners: Talk with your health care provider before you take any medicines that contain aspirin or NSAIDs, such as ibuprofen. These medicines increase your risk for dangerous bleeding. Take your medicine exactly as told, at the same time every day. Avoid activities that could cause injury or bruising, and follow instructions about how to prevent falls. Wear a medical alert bracelet or carry a card that lists what medicines you take. Lifestyle     Do not use any products that contain nicotine or tobacco, such as cigarettes, e-cigarettes, and chewing tobacco. If you need help quitting, ask your health care provider. Eat heart-healthy foods. Talk with a dietitian to make an eating plan that is right for you. Exercise regularly as told   by your health care provider. Do not  drink alcohol. Lose weight if you are overweight. Do not use drugs, including cannabis. General instructions If you have obstructive sleep apnea, manage your condition as told by your health care provider. Do not use diet pills unless your health care provider approves. Diet pills can make heart problems worse. Keep all follow-up visits as told by your health care provider. This is important. Contact a health care provider if you: Notice a change in the rate, rhythm, or strength of your heartbeat. Are taking a blood thinner and you notice more bruising. Tire more easily when you exercise or do heavy work. Have a sudden change in weight. Get help right away if you have:  Chest pain, abdominal pain, sweating, or weakness. Trouble breathing. Side effects of blood thinners, such as blood in your vomit, stool, or urine, or bleeding that cannot stop. Any symptoms of a stroke. "BE FAST" is an easy way to remember the main warning signs of a stroke: B - Balance. Signs are dizziness, sudden trouble walking, or loss of balance. E - Eyes. Signs are trouble seeing or a sudden change in vision. F - Face. Signs are sudden weakness or numbness of the face, or the face or eyelid drooping on one side. A - Arms. Signs are weakness or numbness in an arm. This happens suddenly and usually on one side of the body. S - Speech. Signs are sudden trouble speaking, slurred speech, or trouble understanding what people say. T - Time. Time to call emergency services. Write down what time symptoms started. Other signs of a stroke, such as: A sudden, severe headache with no known cause. Nausea or vomiting. Seizure. These symptoms may represent a serious problem that is an emergency. Do not wait to see if the symptoms will go away. Get medical help right away. Call your local emergency services (911 in the [place] Do not drive yourself to the hospital. Summary Atrial fibrillation is a type of irregular or rapid  heartbeat (arrhythmia). Symptoms include a feeling that your heart is beating fast or irregularly. You may be given medicines to prevent blood clots or to treat heart rate or heart rhythm problems. Get help right away if you have signs or symptoms of a stroke. Get help right away if you cannot catch your breath or have chest pain or pressure. This information is not intended to replace advice given to you by your health care provider. Make sure you discuss any questions you have with your health care provider. Document Revised: 01/23/2019 Document Reviewed: 01/23/2019 Elsevier Patient Education  2023 [employer] Inc[employer]  

## 2021-12-15 DIAGNOSIS — E876 Hypokalemia: Secondary | ICD-10-CM | POA: Insufficient documentation

## 2021-12-15 DIAGNOSIS — T502X5A Adverse effect of carbonic-anhydrase inhibitors, benzothiadiazides and other diuretics, initial encounter: Secondary | ICD-10-CM | POA: Insufficient documentation

## 2021-12-15 LAB — HEMOGLOBIN A1C: Hgb A1c MFr Bld: 6 % (ref 4.6–6.5)

## 2021-12-15 MED ORDER — POTASSIUM CHLORIDE CRYS ER 15 MEQ PO TBCR: 15.0000 meq | EXTENDED_RELEASE_TABLET | Freq: Two times a day (BID) | ORAL | 1 refills | Status: DC

## 2021-12-16 ENCOUNTER — Ambulatory Visit (INDEPENDENT_AMBULATORY_CARE_PROVIDER_SITE_OTHER): Payer: Medicare HMO

## 2021-12-16 DIAGNOSIS — I480 Paroxysmal atrial fibrillation: Secondary | ICD-10-CM

## 2021-12-16 DIAGNOSIS — I10 Essential (primary) hypertension: Secondary | ICD-10-CM

## 2021-12-16 DIAGNOSIS — N1832 Chronic kidney disease, stage 3b: Secondary | ICD-10-CM

## 2021-12-16 DIAGNOSIS — E785 Hyperlipidemia, unspecified: Secondary | ICD-10-CM

## 2021-12-16 DIAGNOSIS — I5032 Chronic diastolic (congestive) heart failure: Secondary | ICD-10-CM

## 2021-12-16 NOTE — Patient Instructions (Signed)
Visit Information  ? ?Following is a copy of your full care plan:  ?Care Plan : [place]  ?Updates made by Tomasa Blase, RPH since 12/16/2021 12:00 AM  ?  ? ?Problem: HTN, CHF, Afib, HLD, CKD, Hypothyroidism, Depression, Insomnia   ?Priority: High  ?Onset Date: 12/16/2021  ?  ? ?Long-Range Goal: Disease Management   ?Start Date: 12/16/2021  ?Expected End Date: 12/17/2022  ?This Visit's Progress: On track  ?Priority: High  ?Note:   ?Current Barriers:  ?Unable to independently monitor therapeutic efficacy ?Does not adhere to prescribed medication regimen ? ?Pharmacist Clinical Goal(s):  ?Patient will achieve adherence to monitoring guidelines and medication adherence to achieve therapeutic efficacy ?adhere to prescribed medication regimen as evidenced by taking medications as directed - no missed doses through collaboration with PharmD and provider.  ? ?Interventions: ?1:1 collaboration with [name], MD regarding development and update of comprehensive plan of care as evidenced by provider attestation and co-signature ?Inter-disciplinary care team collaboration (see longitudinal plan of care) ?Comprehensive medication review performed; medication list updated in electronic medical record ? ? ?Hyperlipidemia: (LDL goal < 70) ?-Not ideally controlled - likely due to noncompliance with statin  ?-Last LDL: 87 mg/dL ?-Current treatment: ?Atorvastatin 38m daily  ?-Medications previously tried: ezetimibe, simvastatin,   ?-Current dietary patterns: not discussed with appointment today  ?-Current exercise habits: limited at this time  ?-Educated on Cholesterol goals;  ?Benefits of statin for ASCVD risk reduction; ?Importance of limiting foods high in cholesterol; ?Exercise goal of 150 minutes per week; ?-Counseled on diet and exercise extensively ?Recommended to continue current medication ? ?Diabetes (A1c goal <7%) ?-Controlled ?Lab Results  ?Component Value Date  ? HGBA1C 6.0 12/14/2021  ?-Current  medications: ?Farxiga 157mdaily  ?-Medications previously tried: n/a  ?-Not currently checking BG at home  ?hypoglycemic/hyperglycemic symptoms ?-Current meal patterns:  ?Not discussed with appointment today ?-Current exercise: limited ?-Educated on A1c and blood sugar goals; ?Complications of diabetes including kidney damage, retinal damage, and cardiovascular disease; ?-Counseled to check feet daily and get yearly eye exams ?-Counseled on diet and exercise extensively ?Recommended to continue current medication ? ?Atrial Fibrillation (Goal: prevent stroke and major bleeding) ?-Controlled ?CHADS2VASc score: Age (2 points), Female sex (1 point), Heart failure history (1 point), Hypertension history (1 point), and Diabetes history (1 point) ?-Current treatment: ?Rate control: Diltiazem 30066maily  ?Anticoagulation: Eliquis 5mg63mice daily  ?-Medications previously tried: metoprolol tartrate, metoprolol succinate,  ?-Home BP and HR readings: n/a - not currently checking at home   ?-Counseled on increased risk of stroke due to Afib and benefits of anticoagulation for stroke prevention; ?importance of adherence to anticoagulant exactly as prescribed; ?bleeding risk associated with eliquis and importance of self-monitoring for signs/symptoms of bleeding; ?avoidance of NSAIDs due to increased bleeding risk with anticoagulants; ?importance of regular laboratory monitoring; ?seeking medical attention after a head injury or if there is blood in the urine/stool; ?-Counseled on diet and exercise extensively ?Recommended to continue current medication ? ?Heart Failure (Goal: manage symptoms and prevent exacerbations) / Hypertension (BP goal <140/90) ?-Controlled ?-Last ejection fraction: 55-60%(Date: 11/11/2018) ?-HF type: Diastolic ?-NYHA Class: II (slight limitation of activity) ?-Current treatment: ?Diltiazem 300mg62mly  ?Furosemide 40mg 82my  ?Farxiga 10mg d43m  ?Potassium Chloride 15mEq t39m daily (has not yet  started) ?Losartan 100mg dai4m?Isosorbide mononitrate 120mg dail32m-Medications previously tried: hctz, isosorbide dinitrate, metoprolol , spironolactone, telmisartan  ?-Current home BP/HR readings: n/a - not checking at this time  ?-  Current dietary habits: not discussed with appointment today  ?-Current exercise habits: limited  ?-Educated on Benefits of medications for managing symptoms and prolonging life ?Proper diuretic administration and potassium supplementation ?Importance of blood pressure control ?-Recommended to continue current medication ?Patient agreeable to purchase BP cuff - will monitor BP and HR at home , reviewed BP goal with patient and daughter, to reach out with any issues or concerns  ? ?Depression / Insomnia (Goal: Promotion of positive mood / quality sleep) ?-Controlled ?-Current treatment: ?Venlafaxine 132m daily  ?Trazodone 223mhs - pt taking 7530ms and will take additional 84m83m needed (rare) ?-Medications previously tried/failed: n/a ?-Educated on Benefits of medication for symptom control ?Benefits of cognitive-behavioral therapy with or without medication ?-Recommended to continue current medication ? ?Hypothyroidism (Goal: Maintenance of Euthyroid levels) ?-Controlled ?Lab Results  ?Component Value Date  ? TSH 2.88 12/14/2021  ?-Current treatment  ?Levothyroxine 84mc28mily  ?-Medications previously tried: n/a  ?-Recommended to continue current medication ?Counseled on importance of adherence to medication, would recommend repeat TSH with next PCP appointment ? ?Health Maintenance ?-Vaccine gaps: COVID booster  ?-Current therapy:  ?APAP 650mg 48mtablets every 8 hours as needed - rarely takes  ?-Patient is satisfied with current therapy and denies issues ?-Recommended to continue current medication ? ?Patient Goals/Self-Care Activities ?Patient will:  ?- take medications as prescribed as evidenced by patient report and record review ?focus on medication adherence by taking  medications as prescribed - using AM and PM pill boxes to help with adherence ?check blood pressure daily, document, and provide at future appointments ? ?Follow Up Plan: Telephone follow up appointment with care management team member scheduled for: 6 weeks  ?The patient has been provided with contact information for the care management team and has been advised to call with any health related questions or concerns.  ?  ? ? ?Consent to CCM Services: ?Ms. Cutrona was given information about Chronic Care Management services including:  ?CCM service includes personalized support from designated clinical staff supervised by her physician, including individualized plan of care and coordination with other care providers ?24/7 contact phone numbers for assistance for urgent and routine care needs. ?Service will only be billed when office clinical staff spend 20 minutes or more in a month to coordinate care. ?Only one practitioner may furnish and bill the service in a calendar month. ?The patient may stop CCM services at any time (effective at the end of the month) by phone call to the office staff. ?The patient will be responsible for cost sharing (co-pay) of up to 20% of the service fee (after annual deductible is met). ? ?Patient agreed to services and verbal consent obtained.  ? ?Plan: Telephone follow up appointment with care management team member scheduled for:  6 weeks  ?The patient has been provided with contact information for the care management team and has been advised to call with any health related questions or concerns.  ? ?DanielTomasa BlasemD ?Clinical Pharmacist, [facility] call the care guide team at [phone] need to cancel or reschedule your appointment.  ? ?The patient verbalized understanding of instructions, educational materials, and care plan provided today and declined offer to receive copy of patient instructions, educational materials, and care plan.  ? ? ?

## 2021-12-16 NOTE — Progress Notes (Signed)
? ?Chronic Care Management ?Pharmacy Note ? ?12/16/2021 ?Name:  [name] [name] MRN:  [id number] DOB:  08/12/1934 ? ?Summary: ?-Spoke with patient and her daughter about medications, notes to nonadherence to AM medications a majority of the time - only taking trazodone at night consistently  ?-Patient is not currently checking BP at home, does not have a BP cuff ?-Daughter organizes medications into two separate weekly pill boxes AM and PM - pt waking up at times ~12pm-1pm - does not have reason as to why she does not take medications, denies any AE from medications  ? ?Recommendations/Changes made from today'[name] visit: ?-Reviewed with patient what each of her medications is used for and how it is to be taken ?-Recommended switching diltiazem, atorvastatin, losartan, isosorbide mononitrate, and levothyroxine at evening pill box ?-Patient will take furosemide, farxiga, and venlafaxine in AM along with her twice daily medications (Eliquis and Potassium) ?-Patient and daughter agreeable to purchase BP arm cuff and monitor BP and HR at home, reviewed BP targets, will reach out with any issues  ?-would recommend rechecking TSH level with next PCP appointment as likely this will change with increased adherence to medication (decided adherence would be further complicated if pt was made to separate levothyroxine from her other medications) ? ?Plan: ?-F/u in 6 weeks  ? ?Subjective: ?[name] [name] is an 88 y.o. year old female who is a primary patient of Jones, Thomas L, MD.  The CCM team was consulted for assistance with disease management and care coordination needs.   ? ?Engaged with patient face to face for initial visit in response to provider referral for pharmacy case management and/or care coordination services.  ? ?Consent to Services:  ?The patient was given the following information about Chronic Care Management services today, agreed to services, and gave verbal consent: 1. CCM service includes personalized support from  designated clinical staff supervised by the primary care provider, including individualized plan of care and coordination with other care providers 2. 24/7 contact phone numbers for assistance for urgent and routine care needs. 3. Service will only be billed when office clinical staff spend 20 minutes or more in a month to coordinate care. 4. Only one practitioner may furnish and bill the service in a calendar month. 5.The patient may stop CCM services at any time (effective at the end of the month) by phone call to the office staff. 6. The patient will be responsible for cost sharing (co-pay) of up to 20% of the service fee (after annual deductible is met). Patient agreed to services and consent obtained. ? ?Patient Care Team: ?Jones, Thomas L, MD as PCP - General (Internal Medicine) ?Croitoru, Mihai, MD as PCP - Cardiology (Cardiology) ?McLean, Dalton S, MD as Consulting Physician (Cardiology) ?Tousey, Laine M, RN as Case Manager ?Moore, Deborah H, LCSW as Social Worker (Licensed Clinical Social Worker) ?Szabat, Daniel C, RPH as Pharmacist (Pharmacist) ? ?Recent office visits: ?12/14/2021 - Dr. Jones - stopped hctz, started potassium - f/u in 3 months  ?09/30/2021 - Dr. Hernandez - VV - COVID positive - rx'd paxlovid  ?07/28/2021 - Dr. Jones - restart diltiazem 180mg daily - f/u in 3 months  ? ?Recent consult visits: ?06/23/2021 - Dr. Jackson - Sports Medicine - d/c NSAID use (pt on eliquis) start APAP prn  - f/u in 2 weeks  ? ?Hospital visits: ?None in previous 6 months ? ?Objective: ? ?Lab Results  ?Component Value Date  ? CREATININE 1.14 12/14/2021  ? BUN 16 12/14/2021  ?   GFR 43.68 ([name]) 12/14/2021  ? GFRNONAA >60 06/26/2020  ? GFRAA 65 02/12/2020  ? NA 138 12/14/2021  ? K 3.3 ([name]) 12/14/2021  ? CALCIUM 9.6 12/14/2021  ? CO2 30 12/14/2021  ? GLUCOSE 88 12/14/2021  ? ? ?Lab Results  ?Component Value Date/Time  ? HGBA1C 6.0 12/14/2021 04:26 PM  ? HGBA1C 6.3 07/28/2021 04:40 PM  ? GFR 43.68 ([name]) 12/14/2021 04:26 PM  ? GFR  62.29 07/28/2021 04:40 PM  ? MICROALBUR 5.9 (H) 07/28/2021 04:40 PM  ?  ?Last diabetic Eye exam:  ?No results found for: HMDIABEYEEXA  ?Last diabetic Foot exam:  ?No results found for: HMDIABFOOTEX  ? ?Lab Results  ?Component Value Date  ? CHOL 156 01/26/2021  ? HDL 53.30 01/26/2021  ? LDLCALC 127 (H) 02/12/2020  ? LDLDIRECT 87.0 01/26/2021  ? TRIG 220.0 (H) 01/26/2021  ? CHOLHDL 3 01/26/2021  ? ? ? ?  Latest Ref Rng & Units 06/25/2020  ?  7:45 PM 03/13/2019  ?  2:37 AM 06/25/2015  ?  9:33 AM  ?Hepatic Function  ?Total Protein 6.5 - 8.1 g/dL 7.1   7.2   7.2    ?Albumin 3.5 - 5.0 g/dL 3.9   4.0   3.5    ?AST 15 - 41 U/[name] 26   21   26    ?ALT 0 - 44 U/[name] 23   21   17    ?Alk Phosphatase 38 - 126 U/[name] 60   69   77    ?Total Bilirubin 0.3 - 1.2 mg/dL 1.0   0.9   0.4    ? ? ?Lab Results  ?Component Value Date/Time  ? TSH 2.88 12/14/2021 04:26 PM  ? TSH 6.37 (H) 07/28/2021 04:40 PM  ? ? ? ?  Latest Ref Rng & Units 07/28/2021  ?  4:40 PM 01/26/2021  ?  2:00 PM 06/26/2020  ?  5:23 AM  ?CBC  ?WBC 4.0 - 10.5 K/uL 8.6   8.9   8.5    ?Hemoglobin 12.0 - 15.0 g/dL 15.9   14.3   15.1    ?Hematocrit 36.0 - 46.0 % 47.7   41.7   44.8    ?Platelets 150.0 - 400.0 K/uL 306.0   297.0   255    ? ? ?No results found for: VD25OH ? ?Clinical ASCVD: No  ?The ASCVD Risk score (Arnett DK, et al., 2019) failed to calculate for the following reasons: ?  The 2019 ASCVD risk score is only valid for ages 40 to 79   ? ? ?  12/14/2021  ?  3:42 PM 08/19/2021  ?  1:15 PM 01/26/2021  ?  1:15 PM  ?Depression screen PHQ 2/9  ?Decreased Interest 3 1 2  ?Down, Depressed, Hopeless 3 1 3  ?PHQ - 2 Score 6 2 5  ?Altered sleeping 2 1   ?Tired, decreased energy 3 1   ?Change in appetite 2 0   ?Feeling bad or failure about yourself  3 1   ?Trouble concentrating 0 1   ?Moving slowly or fidgety/restless 2 0   ?Suicidal thoughts 0 0   ?PHQ-9 Score 18 6   ?Difficult doing work/chores  Somewhat difficult   ?  ? ?Social History  ? ?Tobacco Use  ?Smoking Status Never  ?Smokeless  Tobacco Never  ? ?BP Readings from Last 3 Encounters:  ?12/14/21 118/76  ?07/28/21 (!) 146/98  ?06/23/21 (!) 160/100  ? ?Pulse Readings from Last 3 Encounters:  ?12/14/21 (!) 120  ?  07/28/21 100  ?06/23/21 (!) 106  ? ?Wt Readings from Last 3 Encounters:  ?12/14/21 182 lb (82.6 kg)  ?09/30/21 175 lb (79.4 kg)  ?07/28/21 188 lb (85.3 kg)  ? ?BMI Readings from Last 3 Encounters:  ?12/14/21 33.29 kg/[name]?  ?09/30/21 32.01 kg/[name]?  ?07/28/21 34.39 kg/[name]?  ? ? ?Assessment/Interventions: Review of patient past medical history, allergies, medications, health status, including review of consultants reports, laboratory and other test data, was performed as part of comprehensive evaluation and provision of chronic care management services.  ? ?SDOH:  (Social Determinants of Health) assessments and interventions performed: Yes ? ?SDOH Screenings  ? ?Alcohol Screen: Not on file  ?Depression (PHQ2-9): Medium Risk  ? PHQ-2 Score: 18  ?Financial Resource Strain: Not on file  ?Food Insecurity: No Food Insecurity  ? Worried About Running Out of Food in the Last Year: Never true  ? Ran Out of Food in the Last Year: Never true  ?Housing: Low Risk   ? Last Housing Risk Score: 0  ?Physical Activity: Not on file  ?Social Connections: Not on file  ?Stress: Not on file  ?Tobacco Use: Low Risk   ? Smoking Tobacco Use: Never  ? Smokeless Tobacco Use: Never  ? Passive Exposure: Not on file  ?Transportation Needs: No Transportation Needs  ? Lack of Transportation (Medical): No  ? Lack of Transportation (Non-Medical): No  ? ? ?CCM Care Plan ? ?Allergies  ?Allergen Reactions  ? No Known Allergies   ? ? ?Medications Reviewed Today   ? ? Reviewed by Jones, Thomas L, MD (Physician) on 12/14/21 at 1619  Med List Status: <None>  ? ?Medication Order Taking? Sig Documenting Provider Last Dose Status Informant  ?acetaminophen (TYLENOL) 650 MG CR tablet [id number] Yes Take 1,300 mg by mouth every 8 (eight) hours as needed for pain. [provider] Taking  Active   ?apixaban (ELIQUIS) 5 MG TABS tablet [id number] Yes Take 1 tablet (5 mg total) by mouth 2 (two) times daily. NEEDS CARDIOLOGY VISIT, CALL OFFICE ASAP Croitoru, Mihai, MD Taking Active   ?atorv

## 2021-12-17 ENCOUNTER — Encounter: Payer: Self-pay | Admitting: Internal Medicine

## 2021-12-22 ENCOUNTER — Other Ambulatory Visit: Payer: Self-pay | Admitting: Cardiovascular Disease

## 2021-12-22 DIAGNOSIS — I4821 Permanent atrial fibrillation: Secondary | ICD-10-CM

## 2021-12-23 NOTE — Telephone Encounter (Signed)
Prescription refill request for Eliquis received. ?Indication:Afib ?Last office visit:NEEDS CARDIOLOGY APPOINTMENT ?Scr:1.1 ?Age: 88 ?Weight:82.6 kg ? ?Prescription refilled ? ?

## 2021-12-28 ENCOUNTER — Ambulatory Visit: Payer: Medicare Other | Admitting: *Deleted

## 2021-12-28 DIAGNOSIS — I480 Paroxysmal atrial fibrillation: Secondary | ICD-10-CM

## 2021-12-28 DIAGNOSIS — I10 Essential (primary) hypertension: Secondary | ICD-10-CM

## 2021-12-28 DIAGNOSIS — R296 Repeated falls: Secondary | ICD-10-CM

## 2021-12-28 NOTE — Patient Instructions (Signed)
Visit Information ? ?[name] [name], thank you for taking time to talk with me today about [name] [name] health care needs. Please don't hesitate to contact me if I can be of assistance to you before our next scheduled telephone appointment ? ?Below are the goals we discussed today:  ?Patient Self-Care Activities: ?Patient [name] [name] will: ?Take medications as prescribed- please start taking your medications as instructed/ advised ?Attend all scheduled provider appointments ?Call pharmacy for medication refills ?Call provider office for new concerns or questions ?Stay in touch with your doctor and with the Clinical Social Worker about your ongoing depression ?Continue to try to follow heart healthy, low salt, low cholesterol, carbohydrate-modified, low sugar diet ?Continue trying to check your blood pressure at home once or twice a week, or more often if you are not feeling well ?Keep up the efforts you have made to prevent falls-- use your cane/ walker as needed, and avoid areas and situations that may cause you to fall ? ?Our next scheduled telephone follow up visit/ appointment is scheduled on: Wednesday, February 09, 2022 at 11:30 am- This is a PHONE [place] appointment ? ?If you need to cancel or re-schedule our visit, please call [phone] and our care guide team will be happy to assist you. ?  ?I look forward to hearing about your progress. ?  ?[name], RN, BSN, CCRN Alumnus ?[place] ?[phone]: direct office ? ?If you are experiencing a Mental Health or [place] or need someone to talk to, please  ?call the Suicide and Crisis Lifeline: 988 ?call the [place] National Suicide Prevention Lifeline: (303)745-3513 or TTY: 209-487-8000 TTY 510-189-1350) to talk to a trained counselor ?call 1-800-273-TALK (toll free, 24 hour hotline) ?go to [facility] [address], [place] [phone]) ?call 911  ? ?The patient's  daughter/ caregiver verbalized understanding of instructions, educational materials, and care plan provided today and agreed to receive a mailed copy of patient instructions, educational materials, and care plan ? ?Managing Your Hypertension ?Hypertension, also called high blood pressure, is when the force of the blood pressing against the walls of the arteries is too strong. Arteries are blood vessels that carry blood from your heart throughout your body. Hypertension forces the heart to work harder to pump blood and may cause the arteries to become narrow or stiff. ?Understanding blood pressure readings ?A blood pressure reading includes a higher number over a lower number: ?The first, or top, number is called the systolic pressure. It is a measure of the pressure in your arteries as your heart beats. ?The second, or bottom number, is called the diastolic pressure. It is a measure of the pressure in your arteries as the heart relaxes. ?For most people, a normal blood pressure is below 120/80. Your personal target blood pressure may vary depending on your medical conditions, your age, and other factors. ?Blood pressure is classified into four stages. Based on your blood pressure reading, your health care provider may use the following stages to determine what type of treatment you need, if any. Systolic pressure and diastolic pressure are measured in a unit called millimeters of mercury (mmHg). ?Normal ?Systolic pressure: below 742. ?Diastolic pressure: below 80. ?Elevated ?Systolic pressure: 595-638. ?Diastolic pressure: below 80. ?Hypertension stage 1 ?Systolic pressure: 756-433. ?Diastolic pressure: 29-51. ?Hypertension stage 2 ?Systolic pressure: 884 or above. ?Diastolic pressure: 90 or above. ?How can this condition affect me? ?Managing your hypertension is very important. Over time, hypertension can  damage the arteries and decrease blood flow to parts of the body, including the brain, heart, and kidneys.  Having untreated or uncontrolled hypertension can lead to: ?A heart attack. ?A stroke. ?A weakened blood vessel (aneurysm). ?Heart failure. ?Kidney damage. ?Eye damage. ?Memory and concentration problems. ?Vascular dementia. ?What actions can I take to manage this condition? ?Hypertension can be managed by making lifestyle changes and possibly by taking medicines. Your health care provider will help you make a plan to bring your blood pressure within a normal range. You may be referred for counseling on a healthy diet and physical activity. ?Nutrition ? ?Eat a diet that is high in fiber and potassium, and low in salt (sodium), added sugar, and fat. An example eating plan is called the DASH diet. DASH stands for Dietary Approaches to Stop Hypertension. To eat this way: ?Eat plenty of fresh fruits and vegetables. Try to fill one-half of your plate at each meal with fruits and vegetables. ?Eat whole grains, such as whole-wheat pasta, brown rice, or whole-grain bread. Fill about one-fourth of your plate with whole grains. ?Eat low-fat dairy products. ?Avoid fatty cuts of meat, processed or cured meats, and poultry with skin. Fill about one-fourth of your plate with lean proteins such as fish, chicken without skin, beans, eggs, and tofu. ?Avoid pre-made and processed foods. These tend to be higher in sodium, added sugar, and fat. ?Reduce your daily sodium intake. Many people with hypertension should eat less than 1,500 mg of sodium a day. ?Lifestyle ? ?Work with your health care provider to maintain a healthy body weight or to lose weight. Ask what an ideal weight is for you. ?Get at least 30 minutes of exercise that causes your heart to beat faster (aerobic exercise) most days of the week. Activities may include walking, swimming, or biking. ?Include exercise to strengthen your muscles (resistance exercise), such as weight lifting, as part of your weekly exercise routine. Try to do these types of exercises for 30  minutes at least 3 days a week. ?Do not use any products that contain nicotine or tobacco. These products include cigarettes, chewing tobacco, and vaping devices, such as e-cigarettes. If you need help quitting, ask your health care provider. ?Control any long-term (chronic) conditions you have, such as high cholesterol or diabetes. ?Identify your sources of stress and find ways to manage stress. This may include meditation, deep breathing, or making time for fun activities. ?Alcohol use ?Do not drink alcohol if: ?Your health care provider tells you not to drink. ?You are pregnant, may be pregnant, or are planning to become pregnant. ?If you drink alcohol: ?Limit how much you have to: ?0-1 drink a day for women. ?0-2 drinks a day for men. ?Know how much alcohol is in your drink. In the [place] one drink equals one 12 oz bottle of beer (355 mL), one 5 oz glass of wine (148 mL), or one 1? oz glass of hard liquor (44 mL). ?Medicines ?Your health care provider may prescribe medicine if lifestyle changes are not enough to get your blood pressure under control and if: ?Your systolic blood pressure is 130 or higher. ?Your diastolic blood pressure is 80 or higher. ?Take medicines only as told by your health care provider. Follow the directions carefully. Blood pressure medicines must be taken as told by your health care provider. The medicine does not work as well when you skip doses. Skipping doses also puts you at risk for problems. ?Monitoring ?Before you monitor your blood pressure: ?Do  not smoke, drink caffeinated beverages, or exercise within 30 minutes before taking a measurement. ?Use the bathroom and empty your bladder (urinate). ?Sit quietly for at least 5 minutes before taking measurements. ?Monitor your blood pressure at home as told by your health care provider. To do this: ?Sit with your back straight and supported. ?Place your feet flat on the floor. Do not cross your legs. ?Support your arm on a flat surface,  such as a table. Make sure your upper arm is at heart level. ?Each time you measure, take two or three readings one minute apart and record the results. ?You may also need to have your blood pressure checked regu

## 2021-12-28 NOTE — Chronic Care Management (AMB) (Signed)
?Chronic Care Management  ? ?CCM RN Visit Note ? ?12/28/2021 ?Name: [name] [name] MRN: [id number] DOB: 22-Feb-1934 ? ?Subjective: ?[name] [name] is a 88 y.o. year old female who is a primary care patient of [name] Lima, MD. The care management team was consulted for assistance with disease management and care coordination needs.   ? ?Engaged with patient by telephone for follow up visit in response to provider referral for case management and/or care coordination services.  ? ?Consent to Services:  ?The patient was given information about Chronic Care Management services, agreed to services, and gave verbal consent prior to initiation of services.  Please see initial visit note for detailed documentation.  ?Patient agreed to services and verbal consent obtained.  ? ?Assessment: Review of patient past medical history, allergies, medications, health status, including review of consultants reports, laboratory and other test data, was performed as part of comprehensive evaluation and provision of chronic care management services.  ? ?SDOH (Social Determinants of Health) assessments and interventions performed:  ?SDOH Interventions   ? ?Flowsheet Row Most Recent Value  ?SDOH Interventions   ?Food Insecurity Interventions Intervention Not Indicated  [caregiver/ daughter continues to deny food insecurity]  ?Transportation Interventions Intervention Not Indicated  [caregiver/ daughter continues to provide transportation]  ? ?  ?  ? ?Lutsen ? ?Allergies  ?Allergen Reactions  ? No Known Allergies   ? ? ?Outpatient Encounter Medications as of 12/28/2021  ?Medication Sig Note  ? acetaminophen (TYLENOL) 650 MG CR tablet Take 1,300 mg by mouth every 8 (eight) hours as needed for pain.   ? apixaban (ELIQUIS) 5 MG TABS tablet Take 1 tablet (5 mg total) by mouth 2 (two) times daily. NEEDS CARDIOLOGY APPOINTMENT, PLEASE CALL OFFICE ASAP   ? atorvastatin (LIPITOR) 40 MG tablet Take 1 tablet (40 mg total) by mouth daily.   ?  dapagliflozin propanediol (FARXIGA) 10 MG TABS tablet Take 1 tablet (10 mg total) by mouth daily before breakfast.   ? diltiazem (CARDIZEM CD) 300 MG 24 hr capsule Take 1 capsule (300 mg total) by mouth daily.   ? furosemide (LASIX) 40 MG tablet Take 1 tablet (40 mg total) by mouth daily.   ? isosorbide mononitrate (IMDUR) 120 MG 24 hr tablet Take 120 mg by mouth Daily.    ? levothyroxine (SYNTHROID) 75 MCG tablet Take 75 mcg by mouth daily.   ? losartan (COZAAR) 100 MG tablet Take 100 mg by mouth daily.   ? potassium chloride SA (KLOR-CON M15) 15 MEQ tablet Take 1 tablet (15 mEq total) by mouth 2 (two) times daily.   ? traZODone (DESYREL) 150 MG tablet Take 1.5 tablets (225 mg total) by mouth at bedtime. Taking 1 & 1/2 tablet at bedtime 08/19/2021: 08/19/21:Daughter reports taking one half of one tablet qHS  ? venlafaxine XR (EFFEXOR-XR) 150 MG 24 hr capsule Take 1 capsule (150 mg total) by mouth daily with breakfast.   ? ?No facility-administered encounter medications on file as of 12/28/2021.  ? ? ?Patient Active Problem List  ? Diagnosis Date Noted  ? Diuretic-induced hypokalemia 12/15/2021  ? Encounter for general adult medical examination with abnormal findings 07/30/2021  ? Ataxia 07/28/2021  ? Frequent falls 07/28/2021  ? Permanent atrial fibrillation ([place]) 01/26/2021  ? Stage 3b chronic kidney disease ([place]) 01/26/2021  ? Type II diabetes mellitus ([place])   ? Depression with anxiety 06/25/2020  ? Chronic anticoagulation 11/08/2019  ? Sleep apnea 11/08/2019  ? PAF (paroxysmal atrial fibrillation) ([place]) 12/20/2018  ?  Primary localized osteoarthritis of right knee 09/20/2016  ? Primary osteoarthritis of right knee 09/20/2016  ? Primary osteoarthritis of left knee 10/13/2015  ? Radiculopathy 07/08/2015  ? 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 Chronic diastolic HF (heart failure) ([place]) 07/03/2009  ? Aortic valve disorder 06/17/2009  ? Hypothyroidism 07/28/2008  ? Dyslipidemia, goal LDL below 70 07/28/2008  ?  Essential hypertension 07/28/2008  ? ?Conditions to be addressed/monitored:  Medication non-adherence; Frequent Falls; Atrial Fibrillation and HTN ? ?Care Plan : RN Care Manager Plan of Care  ?Updates made by [name] Royalty, RN since 12/28/2021 12:00 AM  ?  ? ?Problem: Chronic Disease Management Needs   ?Priority: High  ?  ? ?Long-Range Goal: Ongoing adherence to established plan of care for long term chronic disease management   ?Start Date: 08/19/2021  ?Expected End Date: 08/19/2022  ?Priority: High  ?Note:   ?Current Barriers:  ?Chronic Disease Management support and education needs related to Atrial Fibrillation, HTN, and frequent falls ?Non-adherence to prescribed medication regimen ?Ongoing chronic depression ?Multiple falls- patient/ caregiver report 6-plus falls, without serious injury, over last 12 months: patient uses cane/ walker as needed ?09/27/21-- reports only one fall without injury since last outreach 08/19/21-- last fall occurred 08/20/21- so pateint has been fall-free x 5 weeks; continues using cane/ walker "occasionally" ?11/10/21-- daughter reports patient falls (without injury) x "about 2" since our last outreach on 09/27/21, as reported to CCM CSW 10/11/21; today 11/10/21, caregiver daughter reports no "known" falls (daughter works outside of home) "for about 4 weeks;" reports patient continues to use cane and/ or walker "most of the time" ?12/28/21-- caregiver/ daughter reports "no falls for several weeks;" she estimates last fall as "end of February;" reports patient continues using cane/ walker as indicated/ needed ?Memory issues reported by caregiver/ patient ?Need for in-home care assistance: CCM CSW team active; working to obtain PCS through patient's medicaid benefits ?Confirmed through review of CCM CSW notes that patient does not qualify for medicaid/ PCS ?Caregiver reports 09/27/21 that they are not able to afford private duty in-home care assistance ?Caregiver reports 09/27/21 that patient is not  interested/ willing to go to [facility]: states patient sleeps too late in day, and daughter would not be able to provide transportation due to work hours  ?11/10/21: patient reported to CCM CSW 10/11/21 that she had reconsidered option of [facility], and that she was "now willing" to attend ?11/10/21: daughter/ caregiver reports patient is NOT willing to attend, speculates that patient said she would go on 10/11/21, "because that is what she knew we wanted her to say;"- will update CCM CSW as FYI ?11/10/21: caregiver tells me about a conversation that patient brought up with her recently: patient told caregiver/ daughter that she "would be better off" living in a facility-- daughter stated that she agreed with patient and they discussed benefits of having medications provided at regular intervals when they are due, increased socialization, assistance with care needs such as bathing: daughter is willing to discuss this conversation with CCM CSW when she and patient have next telephone appointment 11/15/21-- I will update CCM CSW accordingly  ?12/28/21: Caregiver/ daughter confirms they have spoken to CCM CSW, last outreach 11/29/21 reviewed with caregiver: she confirms that they have received resources which were provided at that time; also reports they have not yet started looking into resources to follow up; caregiver reports patient has "agreed to go to ALF to look at the facility, but I  can promise you, she will not agree to go to an ALF to live" ?12/28/21: Caregiver verbalizes plans to go out of town for a long weekend over [place] day; verbalizes plans to have extended family and neighbors "check in on patient" while she and patient's granddaughter are out of town ?09/27/21- Caregiver confirms they have spoken to [facility] for transportation resources: states "they [place]'t come [place] we [place] ? ?RNCM Clinical Goal(s):  ?Patient will demonstrate  improved health management independence as evidenced by adherence to plan of care for medication regimen, AF/ HTN, fall prevention        through collaboration with RN Care manager, provider, and care team.  ? ?Int

## 2022-01-06 ENCOUNTER — Other Ambulatory Visit: Payer: Self-pay | Admitting: Cardiovascular Disease

## 2022-01-06 DIAGNOSIS — I4821 Permanent atrial fibrillation: Secondary | ICD-10-CM

## 2022-01-06 NOTE — Telephone Encounter (Signed)
Pt last saw [name], NP on 02/26/20, pt is overdue for follow-up.  Pt does have appt scheduled to see Dr Sallyanne Kuster on 03/11/22. Last labs 12/14/21 Creat 1.14, age 88, weight 82.6kg, based on specified criteria pt is on appropriate dosage of Eliquis '5mg'$  BID for afib.  Will refill rx to get pt to upcoming appt.

## 2022-01-12 DIAGNOSIS — I4891 Unspecified atrial fibrillation: Secondary | ICD-10-CM

## 2022-01-12 DIAGNOSIS — I5030 Unspecified diastolic (congestive) heart failure: Secondary | ICD-10-CM

## 2022-01-12 DIAGNOSIS — E785 Hyperlipidemia, unspecified: Secondary | ICD-10-CM | POA: Diagnosis not present

## 2022-01-12 DIAGNOSIS — Z7984 Long term (current) use of oral hypoglycemic drugs: Secondary | ICD-10-CM

## 2022-01-12 DIAGNOSIS — F32A Depression, unspecified: Secondary | ICD-10-CM | POA: Diagnosis not present

## 2022-01-12 DIAGNOSIS — E1159 Type 2 diabetes mellitus with other circulatory complications: Secondary | ICD-10-CM

## 2022-01-12 DIAGNOSIS — I110 Hypertensive heart disease with heart failure: Secondary | ICD-10-CM

## 2022-01-13 ENCOUNTER — Telehealth: Payer: Self-pay | Admitting: *Deleted

## 2022-01-13 NOTE — Chronic Care Management (AMB) (Signed)
  Chronic Care Management Note  01/13/2022 Name: [name] [name] MRN: [id number] DOB: 1934/02/21  [name] is a 88 y.o. year old female who is a primary care patient of [name], MD and is actively engaged with the care management team. I reached out to [name] by phone today to assist with scheduling a follow up visit with the Licensed Clinical Social Worker  Follow up plan: Unsuccessful telephone outreach attempt made. The care management team will reach out to the patient again over the next 7 days. If patient returns call to provider office, please advise to call [place] [place] at [phone].  [facility] Management  Direct Dial: [phone]

## 2022-01-20 NOTE — Chronic Care Management (AMB) (Signed)
  Chronic Care Management Note  01/20/2022 Name: [name] [name] MRN: [id number] DOB: Nov 16, 1933  [name] is a 88 y.o. year old female who is a primary care patient of [name], MD and is actively engaged with the care management team. I reached out to [place] by phone today to assist with scheduling a follow up visit with the Licensed Clinical Social Worker  Follow up plan: The patient has been provided with contact information for the care management team and has been advised to call with any health related questions or concerns. Patient and daughter do not wish to schedule follow up with Licensed Clinical Social worker. Will continue working with [facility] Management  Direct Dial: [phone]

## 2022-01-26 LAB — HM DIABETES EYE EXAM

## 2022-01-27 ENCOUNTER — Telehealth: Payer: Medicare HMO

## 2022-02-09 ENCOUNTER — Ambulatory Visit (INDEPENDENT_AMBULATORY_CARE_PROVIDER_SITE_OTHER): Payer: Medicare HMO | Admitting: *Deleted

## 2022-02-09 DIAGNOSIS — R296 Repeated falls: Secondary | ICD-10-CM

## 2022-02-09 DIAGNOSIS — I10 Essential (primary) hypertension: Secondary | ICD-10-CM

## 2022-02-09 NOTE — Chronic Care Management (AMB) (Signed)
Chronic Care Management   CCM RN Visit Note  02/09/2022 Name: [name] [name] MRN: [id number] DOB: Oct 02, 1933  Subjective: [name] [name] is a 88 y.o. year old female who is a primary care patient of [name], MD. The care management team was consulted for assistance with disease management and care coordination needs.    Engaged with patient's daughter/ caregiver [name] [name], on [place] by telephone for follow up visit/ CCM RN CM case closure in response to provider referral for case management and/or care coordination services.   Consent to Services:  The patient was given information about Chronic Care Management services, agreed to services, and gave verbal consent prior to initiation of services.  Please see initial visit note for detailed documentation.  Patient agreed to services and verbal consent obtained.   Assessment: Review of patient past medical history, allergies, medications, health status, including review of consultants reports, laboratory and other test data, was performed as part of comprehensive evaluation and provision of chronic care management services.   SDOH (Social Determinants of Health) assessments and interventions performed:  SDOH Interventions    Flowsheet Row Most Recent Value  SDOH Interventions   Food Insecurity Interventions Intervention Not Indicated  [caregiver/ daughter continues to deny food insecurity]  Transportation Interventions Intervention Not Indicated  [daughter/ caregver continues to provide transportation]     CCM Care Plan  Allergies  Allergen Reactions   No Known Allergies    Outpatient Encounter Medications as of 02/09/2022  Medication Sig Note   acetaminophen (TYLENOL) 650 MG CR tablet Take 1,300 mg by mouth every 8 (eight) hours as needed for pain.    apixaban (ELIQUIS) 5 MG TABS tablet Take 1 tablet (5 mg total) by mouth 2 (two) times daily. OVERDUE for follow-up, MUST keep upcoming appt for any FUTURE refills.    atorvastatin  (LIPITOR) 40 MG tablet Take 1 tablet (40 mg total) by mouth daily.    dapagliflozin propanediol (FARXIGA) 10 MG TABS tablet Take 1 tablet (10 mg total) by mouth daily before breakfast.    diltiazem (CARDIZEM CD) 300 MG 24 hr capsule Take 1 capsule (300 mg total) by mouth daily.    furosemide (LASIX) 40 MG tablet Take 1 tablet (40 mg total) by mouth daily.    isosorbide mononitrate (IMDUR) 120 MG 24 hr tablet Take 120 mg by mouth Daily.     levothyroxine (SYNTHROID) 75 MCG tablet Take 75 mcg by mouth daily.    losartan (COZAAR) 100 MG tablet Take 100 mg by mouth daily.    potassium chloride SA (KLOR-CON M15) 15 MEQ tablet Take 1 tablet (15 mEq total) by mouth 2 (two) times daily.    traZODone (DESYREL) 150 MG tablet Take 1.5 tablets (225 mg total) by mouth at bedtime. Taking 1 & 1/2 tablet at bedtime 08/19/2021: 08/19/21:Daughter reports taking one half of one tablet qHS   venlafaxine XR (EFFEXOR-XR) 150 MG 24 hr capsule Take 1 capsule (150 mg total) by mouth daily with breakfast.    No facility-administered encounter medications on file as of 02/09/2022.   Patient Active Problem List   Diagnosis Date Noted   Diuretic-induced hypokalemia 12/15/2021   Encounter for general adult medical examination with abnormal findings 07/30/2021   Ataxia 07/28/2021   Frequent falls 07/28/2021   Permanent atrial fibrillation ([place]) 01/26/2021   Stage 3b chronic kidney disease ([place]) 01/26/2021   Type II diabetes mellitus ([place])    Depression with anxiety 06/25/2020   Chronic anticoagulation 11/08/2019   Sleep  apnea 11/08/2019   PAF (paroxysmal atrial fibrillation) ([place]) 12/20/2018   Primary localized osteoarthritis of right knee 09/20/2016   Primary osteoarthritis of right knee 09/20/2016   Primary osteoarthritis of left knee 10/13/2015   Radiculopathy 07/08/2015   Preretinal fibrosis, 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   Chronic diastolic HF (heart failure) ([place]) 07/03/2009   Aortic valve disorder 06/17/2009    Hypothyroidism 07/28/2008   Dyslipidemia, goal LDL below 70 07/28/2008   Essential hypertension 07/28/2008   Conditions to be addressed/monitored:  HTN, frequent falls and A-Fib  Care Plan : RN Care Manager Plan of Care  Updates made by [name], RN since 02/09/2022 12:00 AM     Problem: Chronic Disease Management Needs   Priority: High     Long-Range Goal: Ongoing adherence to established plan of care for long term chronic disease management   Start Date: 08/19/2021  Expected End Date: 08/19/2022  Priority: High  Note:   Current Barriers:  Chronic Disease Management support and education needs related to Atrial Fibrillation, HTN, and frequent falls Non-adherence to prescribed medication regimen Ongoing chronic depression Multiple falls- patient/ caregiver report 6-plus falls, without serious injury, over last 12 months: patient uses cane/ walker as needed 09/27/21-- reports only one fall without injury since last outreach 08/19/21-- last fall occurred 08/20/21- so pateint has been fall-free x 5 weeks; continues using cane/ walker "occasionally" 11/10/21-- daughter reports patient falls (without injury) x "about 2" since our last outreach on 09/27/21, as reported to CCM CSW 10/11/21; today 11/10/21, caregiver daughter reports no "known" falls (daughter works outside of home) "for about 4 weeks;" reports patient continues to use cane and/ or walker "most of the time" 12/28/21-- caregiver/ daughter reports "no falls for several weeks;" she estimates last fall as "end of February;" reports patient continues using cane/ walker as indicated/ needed 02/09/22-- caregiver/ daughter continues to deny new/ recent falls since last fall in February- reports patient using cane/ walker more consistently Memory issues reported by caregiver/ patient Need for in-home care assistance: CCM CSW team active; working to obtain PCS through patient's medicaid benefits Confirmed through review of CCM CSW notes that  patient does not qualify for medicaid/ PCS Caregiver reports 09/27/21 that they are not able to afford private duty in-home care assistance Caregiver reports 09/27/21 that patient is not interested/ willing to go to [facility]: states patient sleeps too late in day, and daughter would not be able to provide transportation due to work hours  11/10/21: patient reported to CCM CSW 10/11/21 that she had reconsidered option of [facility], and that she was "now willing" to attend 11/10/21: daughter/ caregiver reports patient is NOT willing to attend, speculates that patient said she would go on 10/11/21, "because that is what she knew we wanted her to say;"- will update CCM CSW as FYI 11/10/21: caregiver tells me about a conversation that patient brought up with her recently: patient told caregiver/ daughter that she "would be better off" living in a facility-- daughter stated that she agreed with patient and they discussed benefits of having medications provided at regular intervals when they are due, increased socialization, assistance with care needs such as bathing: daughter is willing to discuss this conversation with CCM CSW when she and patient have next telephone appointment 11/15/21-- I will update CCM CSW accordingly  12/28/21: Caregiver/ daughter confirms they have spoken to CCM CSW, last outreach 11/29/21 reviewed with caregiver: she confirms that they have received resources which were provided at that  time; also reports they have not yet started looking into resources to follow up; caregiver reports patient has "agreed to go to ALF to look at the facility, but I can promise you, she will not agree to go to an ALF to live" 12/28/21: Caregiver verbalizes plans to go out of town for a long weekend over [place] day; verbalizes plans to have extended family and neighbors "check in on patient" while she and patient's granddaughter are out of town: 02/09/22- caregiver reports she did go out of town  and had friends/ neighbors/ extended family checking in on patient- reports "it all went fine, she did just fine" 09/27/21- Caregiver confirms they have spoken to [employer] for transportation resources: states "they don't [place] 02/09/22- caregiver confirms patient no longer interested in possibility of ALF; reports that patient has been overall much more adherent to taking medications; she denies ongoing needs today  RNCM Clinical Goal(s):  Patient will demonstrate improved health management independence as evidenced by adherence to plan of care for medication regimen, AF/ HTN, fall prevention        through collaboration with RN Care manager, provider, and care team.   Interventions: 1:1 collaboration with primary care provider regarding development and update of comprehensive plan of care as evidenced by provider attestation and co-signature Inter-disciplinary care team collaboration (see longitudinal plan of care) Evaluation of current treatment plan related to  self management and patient's adherence to plan as established by provider Initial assessment completed 08/19/21 Review of patient status, including review of consultants reports, relevant laboratory and other test results, and medications completed SDOH updated: no new/ unmet concerns identified Pain assessment updated: caregiver denies that patient is in pain Falls assessment updated: she continues to deny new/ recent falls since last reported fall without serious injury  in February 2023; caregiver reports patient is using her cane/ walker more consistently; positive reinforcement provided with encouragement to continue efforts at fall prevention; previously provided education around fall risks/ prevention reinforced Medications discussed: caregiver reports she continues to manage all aspects of patient's medications for her-- today she reports that patient has been taking  medications more consistently during the daytime hours when [name]/ caregiver is at work-- she reports she has also adjusted administration times as allowed, depending on the medication, around [facility] pharmacist recommendation, which "has really helped;" today, she denies current concerns/ issues/ questions around medications; endorses "much better" adherence to taking medications as prescribed Reviewed upcoming scheduled provider appointments: 03/15/22- PCP; 03/11/22- cardiology; caregiver confirms is aware of all and has plans to attend as scheduled Discussed plans for ongoing care management follow up- caregiver/ daughter denies current care coordination/ care management needs and is agreeable to CCM RN CM case closure today; verbalizes understanding to contact PCP or other care providers for any needs that arise in the future, and confirms she has contact information for all care providers     Falls:  (Status: 02/09/22: Goal Met.) Long Term Goal  Assessed for falls since last encounter Assessed patients knowledge of fall risk prevention secondary to previously provided education Confirmed with caregiver that patient continues to use walker/ cane consistently when she goes out  Hypertension: (Status: 02/09/22: Goal Met.) Long Term Goal  Last practice recorded BP readings:  BP Readings from Last 3 Encounters:  12/14/21 118/76  07/28/21 (!) 146/98  06/23/21 (!) 160/100  Most recent eGFR/CrCl: No results found for: EGFR  No components found for: CRCL  Evaluation of current treatment plan related to hypertension self management and patient's adherence to plan as established by provider;   Discussed complications of poorly controlled blood pressure such as heart disease, stroke, circulatory complications, vision complications, kidney impairment, sexual dysfunction;  Reinforced previously provided education around effect of medication non-adherence-- caregiver is very happy that patient has been more  adherent to taking medications Provided education around risks of ongoing uncontrolled HTN- caregiver confirms she is only occasionally monitoring patient's blood pressure at home now that patient is taking medications appropriately Discussed benefits of following heart healthy, low salt, low cholesterol diet Confirmed no recent signs/ symptoms A-Fib; reinforced previously provided education for same along with corresponding action plan    Plan: No further follow up required: caregiver denies current care coordination/ care management needs and is agreeable to CCM RN CM case closure today; CCM RN CM case closure accordingly     [name], RN, BSN, [place] [phone]: direct office

## 2022-02-10 DIAGNOSIS — H54413A Blindness right eye category 3, normal vision left eye: Secondary | ICD-10-CM | POA: Diagnosis not present

## 2022-02-10 DIAGNOSIS — R7303 Prediabetes: Secondary | ICD-10-CM | POA: Diagnosis not present

## 2022-02-11 DIAGNOSIS — I10 Essential (primary) hypertension: Secondary | ICD-10-CM

## 2022-03-03 ENCOUNTER — Other Ambulatory Visit: Payer: Self-pay | Admitting: Cardiovascular Disease

## 2022-03-03 DIAGNOSIS — I4821 Permanent atrial fibrillation: Secondary | ICD-10-CM

## 2022-03-03 NOTE — Telephone Encounter (Signed)
Prescription refill request for Eliquis received. Indication: PAF Last office visit: 02/26/20  Arnold Long NP  (Has an appt with Dr C on 03/11/22) Scr: 1.14 on 12/14/21 Age: 88 Weight: 84.5kg  Based on above findings Eliquis '5mg'$  twice daily is the appropriate dose.  Refill approved.

## 2022-03-04 ENCOUNTER — Other Ambulatory Visit: Payer: Self-pay

## 2022-03-04 DIAGNOSIS — E785 Hyperlipidemia, unspecified: Secondary | ICD-10-CM

## 2022-03-04 DIAGNOSIS — I4821 Permanent atrial fibrillation: Secondary | ICD-10-CM

## 2022-03-04 MED ORDER — DILTIAZEM HCL ER COATED BEADS 300 MG PO CP24: 300.0000 mg | ORAL_CAPSULE | Freq: Every day | ORAL | 1 refills | Status: DC

## 2022-03-04 MED ORDER — ATORVASTATIN CALCIUM 40 MG PO TABS: 40.0000 mg | ORAL_TABLET | Freq: Every day | ORAL | 1 refills | Status: DC

## 2022-03-11 ENCOUNTER — Ambulatory Visit: Payer: Medicare HMO | Admitting: Cardiovascular Disease

## 2022-03-11 ENCOUNTER — Encounter: Payer: Self-pay | Admitting: Cardiovascular Disease

## 2022-03-11 VITALS — BP 106/70 | HR 102 | Ht 62.0 in | Wt 179.4 lb

## 2022-03-11 DIAGNOSIS — I2510 Atherosclerotic heart disease of native coronary artery without angina pectoris: Secondary | ICD-10-CM

## 2022-03-11 DIAGNOSIS — I10 Essential (primary) hypertension: Secondary | ICD-10-CM

## 2022-03-11 DIAGNOSIS — E1122 Type 2 diabetes mellitus with diabetic chronic kidney disease: Secondary | ICD-10-CM | POA: Diagnosis not present

## 2022-03-11 DIAGNOSIS — E7800 Pure hypercholesterolemia, unspecified: Secondary | ICD-10-CM

## 2022-03-11 DIAGNOSIS — N1831 Chronic kidney disease, stage 3a: Secondary | ICD-10-CM | POA: Diagnosis not present

## 2022-03-11 DIAGNOSIS — I4821 Permanent atrial fibrillation: Secondary | ICD-10-CM | POA: Diagnosis not present

## 2022-03-11 DIAGNOSIS — I5032 Chronic diastolic (congestive) heart failure: Secondary | ICD-10-CM | POA: Diagnosis not present

## 2022-03-11 NOTE — Patient Instructions (Signed)

## 2022-03-11 NOTE — Progress Notes (Signed)
Cardiology Office Note:    Date:  03/11/2022   ID:  [name] [name], DOB May 09, 1934, MRN [id number]  PCP:  [name], MD  Cardiologist:  [name], MD  Electrophysiologist:  None   Referring MD: [name], MD   Chief Complaint  Patient presents with   Congestive Heart Failure        Coronary Artery Disease        Irregular Heart Beat    History of Present Illness:    [name] [name] is a 88 y.o. female with a hx of chronic diastolic HF, CAD (mid LAD stent 2006, no significant stenoses at cardiac catheterization 2010 and 2020), paroxysmal atrial fibrillation, HTN, OSA (mild, AHI 5.7/min), moderate obesity, diet controlled DM.  Cardiac catheterization 2020 did report mildly depressed LVEF 45-50%, but this was during atrial fibrillation.  More recently EF 66% by nuclear scintigraphy 2021.  She has had a very good year.  She has not had any problems with shortness of breath with usual activity, although she does become dyspneic if she climbs a flight of stairs.  She has no trouble with household and self-care activities.  She does not have orthopnea or PND and does not have lower extremity edema.  She has not had palpitations, dizziness or syncope.  She has a little bit of unsteadiness in her gait and did have 1 fall, back in February, but none since.  She was hospitalized for a fall in November 2021 (at that time she was incidentally found to have COVID-19 infection and was in atrial fibrillation with rapid ventricular response).  She has not had any serious bleeding problems.  She has not had any focal neurological events.  Her dose of diltiazem was decreased due to dizziness and gait problems from 300 mg daily to 180 mg daily.  Continues to have NYHA functional class II, sometimes functional class III exertional dyspnea.  Usually activities of self-care do not make her short of breath, but making the bed or walking to the mailbox are often enough to make her dyspneic.  She is no  longer taking loop diuretics although she is still on spironolactone.  I am not sure why the diuretics were stopped.  She has normal kidney function by very recent labs.  She has not had orthopnea or PND.  She denies angina pectoris at rest or with activity.  She has not had dizziness, palpitations or syncope.  She does not have intermittent claudication.  Compliance with CPAP appears to remain spotty.  She is above her target "dry weight" of 180 pounds.  She underwent a nuclear stress test on 01/02/2020 which did not show any reversible ischemia, although she did have an apical scar consistent with her previous LAD disease.  EF was normal at 66%.   Sinus rhythm when she wore a telemetry monitor in May 2021, but it seems that she has been in atrial fibrillation now for at least the last 18 months since November 2021, based on ECGs.  She is in sinus rhythm with borderline ventricular rate control today.  Past Medical History:  Diagnosis Date   Anginal pain ([place])    Anxiety    Arthritis    "little in my thumbs"back & knees    Breast cancer ([place])    R breast, treated with mastectomy/tomoxifen in early 2000s   Bronchitis    hx of   CAD (coronary artery disease)    The patient had a left heart catheterization in August  2006 showed an EF of 70%, a 70% mid LAD lesion and 80% distal LAD ledsion, as well as 50% ostial first diagonal lesion.  The patient did have a drug-eluting stent placed in her mid LAD at that time.     Chest pressure 03/13/2019   Depression    Diastolic heart failure    Echo 11/10 with EF 55-60%, moderate diastolic dysfunction, no regional WMAs, mild to moderate TR, moderate LAE, mild AI   Frequent urination at night    GERD (gastroesophageal reflux disease)    "gone since gallbladder took out"   Heart murmur    "small"   Hypercholesterolemia    Hypertension    Hypothyroidism    Mental disorder    Sleep disturbance    sleep study done- told that there wasn't any apnea  concerns, states she is up & down for use of bathroom several times per night    Type II diabetes mellitus ([place])    "controlled w/diet and exercise"   Urinary incontinence     Past Surgical History:  Procedure Laterality Date   25 GAUGE PARS PLANA VITRECTOMY WITH 20 GAUGE MVR PORT Right 06/04/2013   Procedure: 25 GAUGE PARS PLANA VITRECTOMY WITH 20 GAUGE MVR PORT / REMOVAL SILICONE OIL RIGHT EYE. With Endolaser;  Surgeon: Hayden Pedro, MD;  Location: [facility];  Service: Ophthalmology;  Laterality: Right;   ABDOMINAL HYSTERECTOMY  1964   Adenosine Myoview     9/09: EF 69% with normal perfusion images suggesting no ischemia or infarction.     [place] SURGERY  06/2015   fusion-lumbar   BREAST BIOPSY  2001   right   CARDIAC CATHETERIZATION     CARDIOVERSION N/A 03/15/2019   Procedure: CARDIOVERSION;  Surgeon: Fay Records, MD;  Location: [facility] ENDOSCOPY;  Service: Cardiovascular;  Laterality: N/A;   CATARACT EXTRACTION W/ INTRAOCULAR LENS  IMPLANT, BILATERAL  ~ 2000   CHOLECYSTECTOMY  ~ 2004   COLONOSCOPY     CORONARY ANGIOPLASTY     CORONARY ANGIOPLASTY WITH STENT PLACEMENT  2000's   "1"   DILATION AND CURETTAGE OF UTERUS  1960's   "I had 3"   EYE SURGERY     /iol    following cataract removal   GAS/FLUID EXCHANGE Right 06/04/2013   Procedure: GAS/FLUID EXCHANGE;  Surgeon: Hayden Pedro, MD;  Location: [facility];  Service: Ophthalmology;  Laterality: Right;   LEFT HEART CATH AND CORONARY ANGIOGRAPHY N/A 03/14/2019   Procedure: LEFT HEART CATH AND CORONARY ANGIOGRAPHY;  Surgeon: Belva Crome, MD;  Location: [facility] CV LAB;  Service: Cardiovascular;  Laterality: N/A;   MASTECTOMY  2001   right breast   MEMBRANE PEEL Right 06/04/2013   Procedure: MEMBRANE PEEL;  Surgeon: Hayden Pedro, MD;  Location: [facility];  Service: Ophthalmology;  Laterality: Right;   NEUROPLASTY / TRANSPOSITION MEDIAN NERVE AT CARPAL TUNNEL BILATERAL  1990's   PARS PLANA VITRECTOMY  04/03/2012    right   PARS PLANA VITRECTOMY  04/03/2012   Procedure: PARS PLANA VITRECTOMY WITH 25 GAUGE;  Surgeon: Hayden Pedro, MD;  Location: [facility];  Service: Ophthalmology;  Laterality: Right;  Silicone Oil, Perfluron, Repair Complex Traction Retinal Detachment   PLANTAR FASCIA RELEASE  1990's   left   TONSILLECTOMY AND ADENOIDECTOMY  1972   TOTAL KNEE ARTHROPLASTY Left 10/13/2015   Procedure: TOTAL KNEE ARTHROPLASTY;  Surgeon: [place] Nakayama, MD;  Location: [facility];  Service: Orthopedics;  Laterality: Left;  TOTAL KNEE ARTHROPLASTY Right 09/20/2016   Procedure: TOTAL KNEE ARTHROPLASTY;  Surgeon: [place] Nakayama, MD;  Location: [facility];  Service: Orthopedics;  Laterality: Right;   ULNAR NERVE REPAIR     left arm    Current Medications: Current Meds  Medication Sig   acetaminophen (TYLENOL) 650 MG CR tablet Take 1,300 mg by mouth every 8 (eight) hours as needed for pain.   apixaban (ELIQUIS) 5 MG TABS tablet TAKE ONE TABLET BY MOUTH TWICE DAILY @ 9AM & 5PM OVERDUE FOR FOLLOW UP, MUST KEEP UPCOMING APPOINTMENT FOR ANY FUTURE REFILL   atorvastatin (LIPITOR) 40 MG tablet Take 1 tablet (40 mg total) by mouth daily.   dapagliflozin propanediol (FARXIGA) 10 MG TABS tablet Take 1 tablet (10 mg total) by mouth daily before breakfast.   diltiazem (CARDIZEM CD) 180 MG 24 hr capsule Take 180 mg by mouth every morning.   furosemide (LASIX) 40 MG tablet Take 1 tablet (40 mg total) by mouth daily.   isosorbide mononitrate (IMDUR) 120 MG 24 hr tablet Take 120 mg by mouth Daily.    levothyroxine (SYNTHROID) 75 MCG tablet Take 75 mcg by mouth daily.   losartan (COZAAR) 100 MG tablet Take 100 mg by mouth daily.   potassium chloride SA (KLOR-CON M15) 15 MEQ tablet Take 1 tablet (15 mEq total) by mouth 2 (two) times daily.   traZODone (DESYREL) 150 MG tablet Take 1.5 tablets (225 mg total) by mouth at bedtime. Taking 1 & 1/2 tablet at bedtime   venlafaxine XR (EFFEXOR-XR) 150 MG 24 hr capsule Take 1 capsule (150 mg  total) by mouth daily with breakfast.     Allergies:   No known allergies   Social History   Socioeconomic History   Marital status: Married    Spouse name: Not on file   Number of children: Not on file   Years of education: Not on file   Highest education level: Not on file  Occupational History   Occupation: Daycare provider    Employer: RETIRED    Comment: part-time  Tobacco Use   Smoking status: Never   Smokeless tobacco: Never  Vaping Use   Vaping Use: Never used  Substance and Sexual Activity   Alcohol use: No   Drug use: No   Sexual activity: Not Currently  Other Topics Concern   Not on file  Social History Narrative   Not on file   Social Determinants of Health   Financial Resource Strain: Not on file  Food Insecurity: No Food Insecurity (02/09/2022)   Hunger Vital Sign    Worried About Running Out of Food in the Last Year: Never true    Ran Out of Food in the Last Year: Never true  Transportation Needs: No Transportation Needs (02/09/2022)   PRAPARE - Hydrologist (Medical): No    Lack of Transportation (Non-Medical): No  Physical Activity: Not on file  Stress: Not on file  Social Connections: Not on file     Family History: The patient's family history includes Alcoholism in her father; Cancer in her brother and mother.  ROS:   Please see the history of present illness.    All other systems are reviewed and are negative  EKGs/Labs/Other Studies Reviewed:    The following studies were reviewed today: Nuclear stress test 01/02/2020 14-day event monitor 01/23/2020  EKG: ECG is ordered today, shows atrial fibrillation and right bundle branch block, left axis deviation not quite meeting criteria for left anterior  fascicular block.  It is very similar to tracings from November 2021 May 2023 but with better ventricular rate control ECG from 12/09/2019 shows sinus rhythm with incomplete right bundle branch block and borderline  left axis deviation.  No ischemic changes are seen.  Older tracings have shown complete RBBB plus LAFB.  Recent Labs: 07/28/2021: Hemoglobin 15.9; Platelets 306.0 12/14/2021: BUN 16; Creatinine, Ser 1.14; Potassium 3.3; Sodium 138; TSH 2.88  Recent Lipid Panel    Component Value Date/Time   CHOL 156 01/26/2021 1400   CHOL 203 (H) 02/12/2020 0812   TRIG 220.0 (H) 01/26/2021 1400   HDL 53.30 01/26/2021 1400   HDL 57 02/12/2020 0812   CHOLHDL 3 01/26/2021 1400   VLDL 44.0 (H) 01/26/2021 1400   LDLCALC 127 (H) 02/12/2020 0812   LDLDIRECT 87.0 01/26/2021 1400    Physical Exam:    VS:  BP 106/70 (BP Location: Left Arm, Patient Position: Sitting, Cuff Size: Normal)   Pulse (!) 102   Ht '5\' 2"'$  (1.575 m)   Wt 179 lb 6.4 oz (81.4 kg)   SpO2 95%   BMI 32.81 kg/m     Wt Readings from Last 3 Encounters:  03/11/22 179 lb 6.4 oz (81.4 kg)  12/14/21 182 lb (82.6 kg)  09/30/21 175 lb (79.4 kg)      General: Alert, oriented x3, no distress, appears well.  Mildly obese Head: no evidence of trauma, PERRL, EOMI, no exophtalmos or lid lag, no myxedema, no xanthelasma; normal ears, nose and oropharynx Neck: normal jugular venous pulsations and no hepatojugular reflux; brisk carotid pulses without delay and no carotid bruits Chest: clear to auscultation, no signs of consolidation by percussion or palpation, normal fremitus, symmetrical and full respiratory excursions Cardiovascular: normal position and quality of the apical impulse, regular rhythm, normal first and second heart sounds, no murmurs, rubs or gallops Abdomen: no tenderness or distention, no masses by palpation, no abnormal pulsatility or arterial bruits, normal bowel sounds, no hepatosplenomegaly Extremities: no clubbing, cyanosis or edema; 2+ radial, ulnar and brachial pulses bilaterally; 2+ right femoral, posterior tibial and dorsalis pedis pulses; 2+ left femoral, posterior tibial and dorsalis pedis pulses; no subclavian or femoral  bruits Neurological: grossly nonfocal Psych: Normal mood and affect    ASSESSMENT:    1. Permanent atrial fibrillation ([place])   2. Chronic diastolic HF (heart failure) ([place])   3. Coronary artery disease involving native coronary artery of native heart without angina pectoris   4. Type 2 diabetes mellitus with stage 3a chronic kidney disease, without long-term current use of insulin ([place])   5. Hypercholesterolemia   6. Essential hypertension     PLAN:    In order of problems listed above:  CHF: Appears clinically euvolemic.  NYHA functional class II.  Reviewed the importance of sodium restricted diet, weight monitoring, signs and symptoms of heart failure.  We have previously estimated her "dry weight" to be 180 pounds and she is just under that today.  On SGLT2 inhibitor on a relatively low-dose of loop diuretic,. AFib: Borderline ventricular rate control.  In the past we had to use both metoprolol and diltiazem for rate control, but she seems to have a less requirement for AV nodal blocking agents with advancing age.  Dose of diltiazem was reduced due to its effect on her gait stability and blood pressure.  CHADSVasc 7 (age 19, gender, HF, CAD, DM, HTN).  CAD: Asymptomatic.  No significant stenoses at coronary angiography in 2020 and very recent nuclear stress  test in May 2021.  She has really not had any disease progression since 2006 when she received a stent to the LAD.  Not on aspirin due to full anticoagulation.  On statin.  Target LDL cholesterol less than 70. DM: Well-controlled.  Hemoglobin A1c 6.0%.  Wilder Glade is also indicated for heart failure. HLP: Most recent LDL cholesterol was 87 (direct measurement), roughly a year ago.  Target LDL less than 70.  She is having a labs at her upcoming visits with Dr. Ronnald Ramp on August 1. HTN: Excellent control.  She has been requiring lower doses of antihypertensive medications (in the past she was also on beta-blockers and thiazide diuretic). OSA:  She denies daytime hypersomnolence.   Medication Adjustments/Labs and Tests Ordered: Current medicines are reviewed at length with the patient today.  Concerns regarding medicines are outlined above.  Orders Placed This Encounter  Procedures   EKG 12-Lead   No orders of the defined types were placed in this encounter.   Patient Instructions  Medication Instructions:  No changes *If you need a refill on your cardiac medications before your next appointment, please call your pharmacy*   Lab Work: None ordered If you have labs (blood work) drawn today and your tests are completely normal, you will receive your results only by: [place] (if you have MyChart) OR A paper copy in the mail If you have any lab test that is abnormal or we need to change your treatment, we will call you to review the results.   Testing/Procedures: None ordered   Follow-Up: At [facility], you and your health needs are our priority.  As part of our continuing mission to provide you with exceptional heart care, we have created designated Provider Care Teams.  These Care Teams include your primary Cardiologist (physician) and Advanced Practice Providers (APPs -  Physician Assistants and Nurse Practitioners) who all work together to provide you with the care you need, when you need it.  We recommend signing up for the patient portal called "MyChart".  Sign up information is provided on this After Visit Summary.  MyChart is used to connect with patients for Virtual Visits (Telemedicine).  Patients are able to view lab/test results, encounter notes, upcoming appointments, etc.  Non-urgent messages can be sent to your provider as well.   To learn more about what you can do with MyChart, go to [email or web address].    Your next appointment:   12 month(s)  The format for your next appointment:   In Person  Provider:   [name], MD {    Important Information About Sugar          Signed, [name], MD  03/11/2022 3:33 PM    [place]

## 2022-03-15 ENCOUNTER — Ambulatory Visit (INDEPENDENT_AMBULATORY_CARE_PROVIDER_SITE_OTHER): Payer: Medicare HMO | Admitting: Internal Medicine

## 2022-03-15 VITALS — BP 126/82 | HR 85 | Temp 98.1°F | Ht 62.0 in | Wt 180.0 lb

## 2022-03-15 DIAGNOSIS — I10 Essential (primary) hypertension: Secondary | ICD-10-CM | POA: Diagnosis not present

## 2022-03-15 DIAGNOSIS — E785 Hyperlipidemia, unspecified: Secondary | ICD-10-CM | POA: Diagnosis not present

## 2022-03-15 DIAGNOSIS — N1832 Chronic kidney disease, stage 3b: Secondary | ICD-10-CM

## 2022-03-15 DIAGNOSIS — G252 Other specified forms of tremor: Secondary | ICD-10-CM | POA: Insufficient documentation

## 2022-03-15 LAB — LIPID PANEL
Cholesterol: 137 mg/dL (ref 0–200)
HDL: 57.2 mg/dL (ref >39.00)
LDL Cholesterol: 57 mg/dL (ref 0–99)
NonHDL: 79.3
Total CHOL/HDL Ratio: 2
Triglycerides: 114 mg/dL (ref 0.0–149.0)
VLDL: 22.8 mg/dL (ref 0.0–40.0)

## 2022-03-15 LAB — CBC WITH DIFFERENTIAL/PLATELET
Basophils Absolute: 0 10*3/uL (ref 0.0–0.1)
Basophils Relative: 0.6 % (ref 0.0–3.0)
Eosinophils Absolute: 0.2 10*3/uL (ref 0.0–0.7)
Eosinophils Relative: 2.2 % (ref 0.0–5.0)
HCT: 44.9 % (ref 36.0–46.0)
Hemoglobin: 15.2 g/dL — ABNORMAL HIGH (ref 12.0–15.0)
Lymphocytes Relative: 26.7 % (ref 12.0–46.0)
Lymphs Abs: 2 10*3/uL (ref 0.7–4.0)
MCHC: 33.9 g/dL (ref 30.0–36.0)
MCV: 91.1 fl (ref 78.0–100.0)
Monocytes Absolute: 0.6 10*3/uL (ref 0.1–1.0)
Monocytes Relative: 7.7 % (ref 3.0–12.0)
Neutro Abs: 4.8 10*3/uL (ref 1.4–7.7)
Neutrophils Relative %: 62.8 % (ref 43.0–77.0)
Platelets: 262 10*3/uL (ref 150.0–400.0)
RBC: 4.93 Mil/uL (ref 3.87–5.11)
RDW: 14 % (ref 11.5–15.5)
WBC: 7.7 10*3/uL (ref 4.0–10.5)

## 2022-03-15 NOTE — Progress Notes (Unsigned)
Subjective:  Patient ID: [name] [name], female    DOB: [birth year]  Age: 88 y.o. MRN: [id number]  CC: No chief complaint on file.   HPI Laurann Mcmorris Delange presents for ***  Outpatient Medications Prior to Visit  Medication Sig Dispense Refill   acetaminophen (TYLENOL) 650 MG CR tablet Take 1,300 mg by mouth every 8 (eight) hours as needed for pain.     apixaban (ELIQUIS) 5 MG TABS tablet TAKE ONE TABLET BY MOUTH TWICE DAILY @ 9AM & 5PM OVERDUE FOR FOLLOW UP, MUST KEEP UPCOMING APPOINTMENT FOR ANY FUTURE REFILL 60 tablet 1   atorvastatin (LIPITOR) 40 MG tablet Take 1 tablet (40 mg total) by mouth daily. 90 tablet 1   dapagliflozin propanediol (FARXIGA) 10 MG TABS tablet Take 1 tablet (10 mg total) by mouth daily before breakfast. 90 tablet 1   diltiazem (CARDIZEM CD) 180 MG 24 hr capsule Take 180 mg by mouth every morning.     diltiazem (CARDIZEM CD) 300 MG 24 hr capsule Take 1 capsule (300 mg total) by mouth daily. 90 capsule 1   isosorbide mononitrate (IMDUR) 120 MG 24 hr tablet Take 120 mg by mouth Daily.      levothyroxine (SYNTHROID) 75 MCG tablet Take 75 mcg by mouth daily.     losartan (COZAAR) 100 MG tablet Take 100 mg by mouth daily.     potassium chloride SA (KLOR-CON M15) 15 MEQ tablet Take 1 tablet (15 mEq total) by mouth 2 (two) times daily. 180 tablet 1   traZODone (DESYREL) 150 MG tablet Take 1.5 tablets (225 mg total) by mouth at bedtime. Taking 1 & 1/2 tablet at bedtime 135 tablet 1   venlafaxine XR (EFFEXOR-XR) 150 MG 24 hr capsule Take 1 capsule (150 mg total) by mouth daily with breakfast. 90 capsule 1   furosemide (LASIX) 40 MG tablet Take 1 tablet (40 mg total) by mouth daily. 90 tablet 1   No facility-administered medications prior to visit.    ROS Review of Systems  Objective:  BP 126/82 (BP Location: Right Arm, Patient Position: Sitting, Cuff Size: Large)   Pulse 85   Temp 98.1 F (36.7 C) (Oral)   Ht '5\' 2"'$  (1.575 m)   Wt 180 lb (81.6 kg)   SpO2 96%   BMI  32.92 kg/m   BP Readings from Last 3 Encounters:  03/15/22 126/82  03/11/22 106/70  12/14/21 118/76    Wt Readings from Last 3 Encounters:  03/15/22 180 lb (81.6 kg)  03/11/22 179 lb 6.4 oz (81.4 kg)  12/14/21 182 lb (82.6 kg)    Physical Exam  Lab Results  Component Value Date   WBC 8.6 07/28/2021   HGB 15.9 (H) 07/28/2021   HCT 47.7 (H) 07/28/2021   PLT 306.0 07/28/2021   GLUCOSE 88 12/14/2021   CHOL 156 01/26/2021   TRIG 220.0 (H) 01/26/2021   HDL 53.30 01/26/2021   LDLDIRECT 87.0 01/26/2021   LDLCALC 127 (H) 02/12/2020   ALT 23 06/25/2020   AST 26 06/25/2020   NA 138 12/14/2021   K 3.3 (L) 12/14/2021   CL 100 12/14/2021   CREATININE 1.14 12/14/2021   BUN 16 12/14/2021   CO2 30 12/14/2021   TSH 2.88 12/14/2021   INR 0.96 09/13/2016   HGBA1C 6.0 12/14/2021   MICROALBUR 5.9 (H) 07/28/2021    MR Brain Wo Contrast  Result Date: 09/06/2021 CLINICAL DATA:  Ataxia, nontraumatic, stroke excluded. Forgetfulness and confusion. EXAM: MRI HEAD WITHOUT CONTRAST TECHNIQUE: Multiplanar, multiecho  pulse sequences of the brain and surrounding structures were obtained without intravenous contrast. COMPARISON:  None. FINDINGS: Brain: Diffusion imaging does not show any acute or subacute infarction. Chronic small-vessel ischemic changes affect the pons. No focal cerebellar finding. 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 No cortical or large vessel territory infarction. No mass lesion, hemorrhage, hydrocephalus or extra-axial collection. Vascular: Major vessels at the base of the brain show flow. Skull and upper cervical spine: Negative Sinuses/Orbits: Clear/normal Other: None IMPRESSION: No acute or reversible finding. Chronic small-vessel ischemic changes of the pons, thalami, basal ganglia and cerebral hemispheric white matter. Electronically Signed   By:  Nelson Chimes M.D.   On: 09/06/2021 14:43    Assessment & Plan:   Diagnoses and all orders for this visit:  Essential hypertension  Stage 3b chronic kidney disease ([place])  Dyslipidemia, goal LDL below 70   I am having Jurney F. Sleight "[name]" maintain her isosorbide mononitrate, levothyroxine, acetaminophen, losartan, traZODone, venlafaxine XR, dapagliflozin propanediol, furosemide, potassium chloride SA, Eliquis, atorvastatin, diltiazem, and diltiazem.  No orders of the defined types were placed in this encounter.    Follow-up: No follow-ups on file.  Scarlette Calico, MD

## 2022-03-16 ENCOUNTER — Encounter: Payer: Self-pay | Admitting: Internal Medicine

## 2022-03-17 ENCOUNTER — Encounter: Payer: Self-pay | Admitting: Internal Medicine

## 2022-03-21 DIAGNOSIS — Z0100 Encounter for examination of eyes and vision without abnormal findings: Secondary | ICD-10-CM | POA: Diagnosis not present

## 2022-03-28 ENCOUNTER — Other Ambulatory Visit: Payer: Self-pay

## 2022-03-28 MED ORDER — DILTIAZEM HCL ER COATED BEADS 180 MG PO CP24: 180.0000 mg | ORAL_CAPSULE | Freq: Every morning | ORAL | 3 refills | Status: DC

## 2022-05-02 ENCOUNTER — Other Ambulatory Visit: Payer: Self-pay | Admitting: Internal Medicine

## 2022-05-02 ENCOUNTER — Other Ambulatory Visit: Payer: Self-pay | Admitting: Cardiovascular Disease

## 2022-05-02 DIAGNOSIS — I4821 Permanent atrial fibrillation: Secondary | ICD-10-CM

## 2022-05-02 DIAGNOSIS — I5032 Chronic diastolic (congestive) heart failure: Secondary | ICD-10-CM

## 2022-05-02 NOTE — Telephone Encounter (Signed)
Prescription refill request for Eliquis received. Indication:Afib  Last office visit: 03/11/22 ([place]) Scr: 1.14 (12/14/21)  Age: 88 Weight: 81.6kg  Appropriate dose and refill sent to requested pharmacy.

## 2022-06-07 ENCOUNTER — Telehealth: Payer: Self-pay | Admitting: Cardiovascular Disease

## 2022-06-07 ENCOUNTER — Telehealth: Payer: Self-pay | Admitting: Internal Medicine

## 2022-06-07 DIAGNOSIS — I4821 Permanent atrial fibrillation: Secondary | ICD-10-CM

## 2022-06-07 DIAGNOSIS — I5032 Chronic diastolic (congestive) heart failure: Secondary | ICD-10-CM

## 2022-06-07 MED ORDER — FUROSEMIDE 40 MG PO TABS: ORAL_TABLET | ORAL | 1 refills | Status: DC

## 2022-06-07 MED ORDER — APIXABAN 5 MG PO TABS: 5.0000 mg | ORAL_TABLET | Freq: Two times a day (BID) | ORAL | 1 refills | Status: DC

## 2022-06-07 NOTE — Telephone Encounter (Signed)
Caller & Relationship to patient: Rodman Key at [employer] back number: [phone]  Date of last office visit: 03-15-22  Date of next office visit: None scheduled and no recommendation in last office note  Medication(s) to be refilled: furosemide (LASIX) 40 MG tablet  Preferred Pharmacy: [facility] - [place], [place]

## 2022-06-07 NOTE — Telephone Encounter (Signed)
Prescription refill request for Eliquis received. Indication: Afib  Last office visit: 03/11/22 ([place])  Scr: 1.14 (12/14/21)  Age: 88 Weight: 81.6kg  Appropriate dose and refill sent to requested pharmacy.

## 2022-06-07 NOTE — Telephone Encounter (Signed)
*  STAT* If patient is at the pharmacy, call can be transferred to refill team.   1. Which medications need to be refilled? (please list name of each medication and dose if known)  apixaban (ELIQUIS) 5 MG TABS tablet  2. Which pharmacy/location (including street and city if local pharmacy) is medication to be sent to? [facility] - [place], [place]  3. Do they need a 30 day or 90 day supply? 90 day

## 2022-06-16 ENCOUNTER — Other Ambulatory Visit: Payer: Self-pay | Admitting: Internal Medicine

## 2022-06-16 DIAGNOSIS — I10 Essential (primary) hypertension: Secondary | ICD-10-CM

## 2022-06-16 DIAGNOSIS — I4821 Permanent atrial fibrillation: Secondary | ICD-10-CM

## 2022-06-16 DIAGNOSIS — T502X5A Adverse effect of carbonic-anhydrase inhibitors, benzothiadiazides and other diuretics, initial encounter: Secondary | ICD-10-CM

## 2022-06-27 ENCOUNTER — Ambulatory Visit (INDEPENDENT_AMBULATORY_CARE_PROVIDER_SITE_OTHER): Payer: Medicare HMO | Admitting: Internal Medicine

## 2022-06-27 ENCOUNTER — Encounter: Payer: Self-pay | Admitting: Internal Medicine

## 2022-06-27 VITALS — BP 120/64 | HR 107 | Temp 97.5°F | Ht 62.0 in | Wt 181.0 lb

## 2022-06-27 DIAGNOSIS — Z23 Encounter for immunization: Secondary | ICD-10-CM

## 2022-06-27 DIAGNOSIS — L299 Pruritus, unspecified: Secondary | ICD-10-CM | POA: Diagnosis not present

## 2022-06-27 DIAGNOSIS — I480 Paroxysmal atrial fibrillation: Secondary | ICD-10-CM | POA: Diagnosis not present

## 2022-06-27 DIAGNOSIS — E1140 Type 2 diabetes mellitus with diabetic neuropathy, unspecified: Secondary | ICD-10-CM

## 2022-06-27 MED ORDER — SELENIUM SULFIDE 2.5 % EX LOTN: 1.0000 | TOPICAL_LOTION | Freq: Every day | CUTANEOUS | 12 refills | Status: DC | PRN

## 2022-06-27 MED ORDER — CLOBETASOL PROPIONATE 0.05 % EX SOLN: 1.0000 | Freq: Two times a day (BID) | CUTANEOUS | 3 refills | Status: DC

## 2022-06-27 NOTE — Addendum Note (Signed)
Addended by: Pricilla Holm A on: 06/27/2022 11:53 AM   Modules accepted: Orders, Level of Service

## 2022-06-27 NOTE — Assessment & Plan Note (Signed)
Patient confused by medications during visit and was asking if she should be taking both diltiazem doses on her list 180 mg daily and 300 mg daily. Lately she is unsure if she is taking either. Will route a note to PCP to address. Last note from PCP and cardiology neither specify dosing of her diltiazem and cardiology prescribes 180 mg daily and PCP 300 mg daily so this is unclear.

## 2022-06-27 NOTE — Progress Notes (Signed)
   Subjective:   Patient ID: [name] [name], female    DOB: 07-Jan-1934, 88 y.o.   MRN: [id number]  HPI The patient is an 88 YO female coming in for concerns about itching to scalp for months. Overall worsening but gradual. Some confusion about medication doses.  Review of Systems  Constitutional: Negative.   HENT: Negative.    Eyes: Negative.   Respiratory:  Negative for cough, chest tightness and shortness of breath.   Cardiovascular:  Negative for chest pain, palpitations and leg swelling.  Gastrointestinal:  Negative for abdominal distention, abdominal pain, constipation, diarrhea, nausea and vomiting.  Musculoskeletal: Negative.   Skin:  Positive for rash.  Neurological: Negative.   Psychiatric/Behavioral: Negative.      Objective:  Physical Exam Constitutional:      Appearance: She is well-developed.  HENT:     Head: Normocephalic and atraumatic.  Cardiovascular:     Rate and Rhythm: Normal rate. Rhythm irregular.  Pulmonary:     Effort: Pulmonary effort is normal. No respiratory distress.     Breath sounds: Normal breath sounds. No wheezing or rales.  Abdominal:     General: Bowel sounds are normal. There is no distension.     Palpations: Abdomen is soft.     Tenderness: There is no abdominal tenderness. There is no rebound.  Musculoskeletal:     Cervical back: Normal range of motion.  Skin:    General: Skin is warm and dry.     Comments: Scalp with redness and flaking skin left scalp and to behind left ear  Neurological:     Mental Status: She is alert and oriented to person, place, and time.     Coordination: Coordination normal.     Vitals:   06/27/22 1110  BP: 120/64  Pulse: (!) 107  Temp: (!) 97.5 F (36.4 C)  TempSrc: Oral  SpO2: 96%  Weight: 181 lb (82.1 kg)  Height: '5\' 2"'$  (1.575 m)    Assessment & Plan:  Flu shot given at visit

## 2022-06-27 NOTE — Assessment & Plan Note (Signed)
Was overdue for foot exam done during visit. Taking farxiga 10 mg daily and advised to continue.

## 2022-06-27 NOTE — Assessment & Plan Note (Signed)
Looks to be either psoriasis or seborrheic dermatitis. Will cover for both with clobetasol cream use BID and selenium sulfide shampoo to use 3-4 times weekly to help symptoms.

## 2022-06-27 NOTE — Patient Instructions (Addendum)
We have sent in the shampoo and the clobetasol to help.   Use the shampoo 3-4 times a week.   The clobetasol you can use 2 times a day.

## 2022-06-28 ENCOUNTER — Telehealth: Payer: Self-pay | Admitting: Internal Medicine

## 2022-06-28 MED ORDER — SELENIUM SULFIDE 2.5 % EX LOTN: 1.0000 | TOPICAL_LOTION | Freq: Every day | CUTANEOUS | 12 refills | Status: DC | PRN

## 2022-06-28 MED ORDER — CLOBETASOL PROPIONATE 0.05 % EX SOLN: 1.0000 | Freq: Two times a day (BID) | CUTANEOUS | 3 refills | Status: DC

## 2022-06-28 NOTE — Telephone Encounter (Signed)
Daughter called to request RX from yesterday be [facility] [place]  CLOBETASOL 0.05% SELENIUM SULFIDE 2.5% SHAMPOO  It will take 2 weeks for the mail pharmacy to get it delivered.  Daughter [phone]

## 2022-08-22 ENCOUNTER — Telehealth: Payer: Self-pay | Admitting: Internal Medicine

## 2022-08-22 ENCOUNTER — Other Ambulatory Visit: Payer: Self-pay | Admitting: Internal Medicine

## 2022-08-22 DIAGNOSIS — E039 Hypothyroidism, unspecified: Secondary | ICD-10-CM

## 2022-08-22 MED ORDER — LEVOTHYROXINE SODIUM 75 MCG PO TABS: 75.0000 ug | ORAL_TABLET | Freq: Every day | ORAL | 0 refills | Status: DC

## 2022-08-22 NOTE — Telephone Encounter (Signed)
Caller & Relationship to patient:  [name] - daughter   Call back number:  [phone]   Date of last office visit:  04/15/2023   Date of next office visit:   Medication(s) to be refilled:  synthroid 75 mcg        [facility] [place]

## 2022-08-31 ENCOUNTER — Other Ambulatory Visit: Payer: Self-pay | Admitting: Internal Medicine

## 2022-08-31 ENCOUNTER — Other Ambulatory Visit: Payer: Self-pay | Admitting: Cardiovascular Disease

## 2022-08-31 DIAGNOSIS — I5032 Chronic diastolic (congestive) heart failure: Secondary | ICD-10-CM

## 2022-08-31 DIAGNOSIS — E785 Hyperlipidemia, unspecified: Secondary | ICD-10-CM

## 2022-08-31 DIAGNOSIS — N1832 Chronic kidney disease, stage 3b: Secondary | ICD-10-CM

## 2022-08-31 DIAGNOSIS — E1122 Type 2 diabetes mellitus with diabetic chronic kidney disease: Secondary | ICD-10-CM

## 2022-09-19 ENCOUNTER — Telehealth: Payer: Self-pay | Admitting: Internal Medicine

## 2022-09-19 NOTE — Telephone Encounter (Signed)
MEDICATION:FARXIGA 10 MG TABS tablet   [place] - [place] [place]   Comments:   **Let patient know to contact pharmacy at the end of the day to make sure medication is ready. **  ** Please notify patient to allow 48-72 hours to process**  **Encourage patient to contact the pharmacy for refills or they can request refills through [facility]**

## 2022-09-19 NOTE — Telephone Encounter (Signed)
Attempted to contact pt to schedule f/u OV. Could not leave a VM as her VM is full and could not receive any additional messages.

## 2022-10-18 ENCOUNTER — Ambulatory Visit: Payer: Medicare HMO | Admitting: Internal Medicine

## 2022-10-19 ENCOUNTER — Ambulatory Visit (INDEPENDENT_AMBULATORY_CARE_PROVIDER_SITE_OTHER): Payer: Medicare HMO | Admitting: Internal Medicine

## 2022-10-19 ENCOUNTER — Encounter: Payer: Self-pay | Admitting: Internal Medicine

## 2022-10-19 ENCOUNTER — Ambulatory Visit: Payer: Medicare HMO | Admitting: Internal Medicine

## 2022-10-19 VITALS — BP 112/80 | HR 112 | Temp 97.9°F | Ht 62.0 in | Wt 179.0 lb

## 2022-10-19 DIAGNOSIS — H5789 Other specified disorders of eye and adnexa: Secondary | ICD-10-CM

## 2022-10-19 MED ORDER — TRIAMCINOLONE ACETONIDE 0.5 % EX OINT: 1.0000 | TOPICAL_OINTMENT | Freq: Two times a day (BID) | CUTANEOUS | 0 refills | Status: DC

## 2022-10-19 NOTE — Progress Notes (Unsigned)
   Subjective:   Patient ID: [name] [name], female    DOB: [birth year], 89 y.o.   MRN: [id number]  HPI The patient is an 89 YO female coming in for redness and puffiness around left eye. Starting to go to right eye. Scratching/rubbing often due to irritation. Eyes feel gritty. Some drainage in the morning but not throughout the day.  Review of Systems  Constitutional: Negative.   HENT: Negative.    Eyes:  Positive for discharge and itching.  Respiratory:  Negative for cough, chest tightness and shortness of breath.   Cardiovascular:  Negative for chest pain, palpitations and leg swelling.  Gastrointestinal:  Negative for abdominal distention, abdominal pain, constipation, diarrhea, nausea and vomiting.  Musculoskeletal: Negative.   Skin: Negative.   Neurological: Negative.   Psychiatric/Behavioral: Negative.      Objective:  Physical Exam Constitutional:      Appearance: She is well-developed.  HENT:     Head: Normocephalic and atraumatic.     Comments: Redness with puffiness around left eyelid, conjunctivae clear no spontaneous drainage    Ears:     Comments: Ears with redness and stigmata of scratching externa ear bilateral with redness of ear canal as well bilateral  Cardiovascular:     Rate and Rhythm: Normal rate and regular rhythm.  Pulmonary:     Effort: Pulmonary effort is normal. No respiratory distress.     Breath sounds: Normal breath sounds. No wheezing or rales.  Abdominal:     General: Bowel sounds are normal. There is no distension.     Palpations: Abdomen is soft.     Tenderness: There is no abdominal tenderness. There is no rebound.  Musculoskeletal:     Cervical back: Normal range of motion.  Skin:    General: Skin is warm and dry.  Neurological:     Mental Status: She is alert and oriented to person, place, and time.     Coordination: Coordination normal.     Vitals:   10/19/22 1125  BP: 112/80  Pulse: (!) 112  Temp: 97.9 F (36.6 C)  TempSrc:  Oral  SpO2: 97%  Weight: 179 lb (81.2 kg)  Height: '5\' 2"'$  (1.575 m)    Assessment & Plan:

## 2022-10-19 NOTE — Patient Instructions (Signed)
We have sent in triamcinolone ointment to use daily around the eye and twice a day on the ears.   You can use saline eye drops in the eyes for the irritation.

## 2022-10-20 ENCOUNTER — Encounter: Payer: Self-pay | Admitting: Internal Medicine

## 2022-10-20 DIAGNOSIS — H5789 Other specified disorders of eye and adnexa: Secondary | ICD-10-CM | POA: Insufficient documentation

## 2022-10-20 NOTE — Assessment & Plan Note (Addendum)
Eyelid redness and mild drainage overnight seem consistent with viral or allergic etiology. No new products or creams. Rx triamcinolone ointment to use daily on eyelids, BID on the external ears and with q tips daily in ear canal. Advised not to scratch. This is mostly unilateral making thyroid change unlikely and she has not missed doses. Return for lack of improvement or worsening.

## 2022-10-29 ENCOUNTER — Other Ambulatory Visit: Payer: Self-pay | Admitting: Internal Medicine

## 2022-11-20 ENCOUNTER — Other Ambulatory Visit: Payer: Self-pay | Admitting: Internal Medicine

## 2022-11-20 DIAGNOSIS — E039 Hypothyroidism, unspecified: Secondary | ICD-10-CM

## 2022-11-30 ENCOUNTER — Telehealth: Payer: Self-pay

## 2022-11-30 NOTE — Telephone Encounter (Signed)
Called patient to schedule Medicare Annual Wellness Visit (AWV). No voicemail available to leave a message.  Last date of AWV: eligible 08/15/09 for AWV-I  Please schedule an appointment at any time on Annual Wellness Schedule.

## 2022-12-01 ENCOUNTER — Other Ambulatory Visit: Payer: Self-pay | Admitting: Internal Medicine

## 2022-12-01 ENCOUNTER — Other Ambulatory Visit: Payer: Self-pay | Admitting: Cardiovascular Disease

## 2022-12-01 DIAGNOSIS — I4821 Permanent atrial fibrillation: Secondary | ICD-10-CM

## 2022-12-01 DIAGNOSIS — I5032 Chronic diastolic (congestive) heart failure: Secondary | ICD-10-CM

## 2022-12-01 DIAGNOSIS — E1122 Type 2 diabetes mellitus with diabetic chronic kidney disease: Secondary | ICD-10-CM

## 2022-12-01 DIAGNOSIS — N1832 Chronic kidney disease, stage 3b: Secondary | ICD-10-CM

## 2022-12-02 NOTE — Telephone Encounter (Signed)
Pt last saw Dr Royann Shivers 03/11/22, last labs 12/14/21 Creat 1.14, age 89, weight 81.2kg, based on specified criteria pt is on appropriate dosage of Eliquis  BID for afib.  Will refill rx.

## 2022-12-05 ENCOUNTER — Telehealth: Payer: Self-pay | Admitting: Internal Medicine

## 2022-12-05 DIAGNOSIS — E039 Hypothyroidism, unspecified: Secondary | ICD-10-CM

## 2022-12-05 MED ORDER — LEVOTHYROXINE SODIUM 75 MCG PO TABS: 75.0000 ug | ORAL_TABLET | Freq: Every day | ORAL | 0 refills | Status: DC

## 2022-12-05 NOTE — Telephone Encounter (Signed)
Called pt/sister spoke w/ [name] inform pt is due for appt. Made appt for 12/20/22 @ 3:20. Camelia Eng states she is out of the levothyroxine. Inform her will send 30 day supply until appt.Marland KitchenRaechel Chute

## 2022-12-05 NOTE — Telephone Encounter (Signed)
Prescription Request  12/05/2022  LOV: 03/15/2022  What is the name of the medication or equipment? levothyroxine  Have you contacted your pharmacy to request a refill? Yes   Which pharmacy would you like this sent to?  [id number] [place], [place] - [address] AT [facility] [address] [place] [address] Phone: [phone] Fax: [phone]    Patient notified that their request is being sent to the clinical staff for review and that they should receive a response within 2 business days.   Please advise at Mobile [phone] (mobile)

## 2022-12-15 ENCOUNTER — Other Ambulatory Visit: Payer: Self-pay | Admitting: Internal Medicine

## 2022-12-15 DIAGNOSIS — I4821 Permanent atrial fibrillation: Secondary | ICD-10-CM

## 2022-12-15 DIAGNOSIS — I10 Essential (primary) hypertension: Secondary | ICD-10-CM

## 2022-12-15 DIAGNOSIS — E876 Hypokalemia: Secondary | ICD-10-CM

## 2022-12-20 ENCOUNTER — Ambulatory Visit (INDEPENDENT_AMBULATORY_CARE_PROVIDER_SITE_OTHER): Payer: Medicare HMO | Admitting: Internal Medicine

## 2022-12-20 ENCOUNTER — Encounter: Payer: Self-pay | Admitting: Internal Medicine

## 2022-12-20 VITALS — BP 112/74 | HR 85 | Temp 98.0°F | Ht 62.0 in | Wt 182.0 lb

## 2022-12-20 DIAGNOSIS — I10 Essential (primary) hypertension: Secondary | ICD-10-CM

## 2022-12-20 DIAGNOSIS — E039 Hypothyroidism, unspecified: Secondary | ICD-10-CM | POA: Diagnosis not present

## 2022-12-20 DIAGNOSIS — I5032 Chronic diastolic (congestive) heart failure: Secondary | ICD-10-CM | POA: Diagnosis not present

## 2022-12-20 DIAGNOSIS — Z7984 Long term (current) use of oral hypoglycemic drugs: Secondary | ICD-10-CM

## 2022-12-20 DIAGNOSIS — Z0000 Encounter for general adult medical examination without abnormal findings: Secondary | ICD-10-CM | POA: Diagnosis not present

## 2022-12-20 DIAGNOSIS — I4811 Longstanding persistent atrial fibrillation: Secondary | ICD-10-CM | POA: Diagnosis not present

## 2022-12-20 DIAGNOSIS — E1122 Type 2 diabetes mellitus with diabetic chronic kidney disease: Secondary | ICD-10-CM | POA: Diagnosis not present

## 2022-12-20 DIAGNOSIS — I480 Paroxysmal atrial fibrillation: Secondary | ICD-10-CM

## 2022-12-20 DIAGNOSIS — N182 Chronic kidney disease, stage 2 (mild): Secondary | ICD-10-CM

## 2022-12-20 DIAGNOSIS — N1831 Chronic kidney disease, stage 3a: Secondary | ICD-10-CM | POA: Diagnosis not present

## 2022-12-20 DIAGNOSIS — N1832 Chronic kidney disease, stage 3b: Secondary | ICD-10-CM

## 2022-12-20 DIAGNOSIS — Z0001 Encounter for general adult medical examination with abnormal findings: Secondary | ICD-10-CM

## 2022-12-20 DIAGNOSIS — E785 Hyperlipidemia, unspecified: Secondary | ICD-10-CM | POA: Diagnosis not present

## 2022-12-20 LAB — CBC WITH DIFFERENTIAL/PLATELET
Basophils Absolute: 0 10*3/uL (ref 0.0–0.1)
Basophils Relative: 0.6 % (ref 0.0–3.0)
Eosinophils Absolute: 0.1 10*3/uL (ref 0.0–0.7)
Eosinophils Relative: 1.7 % (ref 0.0–5.0)
HCT: 47.1 % — ABNORMAL HIGH (ref 36.0–46.0)
Hemoglobin: 15.9 g/dL — ABNORMAL HIGH (ref 12.0–15.0)
Lymphocytes Relative: 25.6 % (ref 12.0–46.0)
Lymphs Abs: 2.2 10*3/uL (ref 0.7–4.0)
MCHC: 33.7 g/dL (ref 30.0–36.0)
MCV: 92.9 fl (ref 78.0–100.0)
Monocytes Absolute: 0.7 10*3/uL (ref 0.1–1.0)
Monocytes Relative: 8.7 % (ref 3.0–12.0)
Neutro Abs: 5.3 10*3/uL (ref 1.4–7.7)
Neutrophils Relative %: 63.4 % (ref 43.0–77.0)
Platelets: 279 10*3/uL (ref 150.0–400.0)
RBC: 5.07 Mil/uL (ref 3.87–5.11)
RDW: 14.1 % (ref 11.5–15.5)
WBC: 8.4 10*3/uL (ref 4.0–10.5)

## 2022-12-20 LAB — HEPATIC FUNCTION PANEL
ALT: 25 U/L (ref 0–35)
AST: 24 U/L (ref 0–37)
Albumin: 4.4 g/dL (ref 3.5–5.2)
Alkaline Phosphatase: 91 U/L (ref 39–117)
Bilirubin, Direct: 0.2 mg/dL (ref 0.0–0.3)
Total Bilirubin: 0.7 mg/dL (ref 0.2–1.2)
Total Protein: 7.6 g/dL (ref 6.0–8.3)

## 2022-12-20 LAB — BASIC METABOLIC PANEL
BUN: 16 mg/dL (ref 6–23)
CO2: 31 mEq/L (ref 19–32)
Calcium: 9.7 mg/dL (ref 8.4–10.5)
Chloride: 101 mEq/L (ref 96–112)
Creatinine, Ser: 1.03 mg/dL (ref 0.40–1.20)
GFR: 48.98 mL/min — ABNORMAL LOW (ref >60.00)
Glucose, Bld: 107 mg/dL — ABNORMAL HIGH (ref 70–99)
Potassium: 4 mEq/L (ref 3.5–5.1)
Sodium: 140 mEq/L (ref 135–145)

## 2022-12-20 LAB — TSH: TSH: 0.76 u[IU]/mL (ref 0.35–5.50)

## 2022-12-20 LAB — HEMOGLOBIN A1C: Hgb A1c MFr Bld: 6.2 % (ref 4.6–6.5)

## 2022-12-20 NOTE — Patient Instructions (Signed)

## 2022-12-20 NOTE — Progress Notes (Signed)
Subjective:  Patient ID: [name] [name], female    DOB: 05-05-1934  Age: 89 y.o. MRN: [id number]  CC: Annual Exam, Atrial Fibrillation, Hypertension, and Hypothyroidism   HPI [name] [name] presents for a CPX and f/up --   She denies chest pain, shortness of breath, diaphoresis, or edema.  She has dizzy spells and has had a couple of falls recently but no injuries.  Outpatient Medications Prior to Visit  Medication Sig Dispense Refill   acetaminophen (TYLENOL) 650 MG CR tablet Take 1,300 mg by mouth every 8 (eight) hours as needed for pain.     apixaban (ELIQUIS) 5 MG TABS tablet Take 1 tablet (5 mg total) by mouth 2 (two) times daily. 180 tablet 1   atorvastatin (LIPITOR) 40 MG tablet TAKE ONE TABLET BY MOUTH DAILY AT 5 PM 90 tablet 3   clobetasol (TEMOVATE) 0.05 % external solution APPLY ONE application TWICE DAILY 50 mL 11   diltiazem (CARDIZEM CD) 180 MG 24 hr capsule Take 1 capsule (180 mg total) by mouth every morning. 90 capsule 3   diltiazem (CARDIZEM CD) 300 MG 24 hr capsule Take 1 capsule (300 mg total) by mouth daily. Must keep appt for additional refills 90 capsule 0   isosorbide mononitrate (IMDUR) 120 MG 24 hr tablet Take 120 mg by mouth Daily.      losartan (COZAAR) 100 MG tablet Take 100 mg by mouth daily.     Potassium Chloride ER (KLOR-CON M15) 15 MEQ TBCR Take 1 tablet by mouth twice a day Keep appt for additional refills 180 tablet 0   selenium sulfide (SELSUN) 2.5 % shampoo Apply 1 Application topically daily as needed for irritation. 118 mL 12   traZODone (DESYREL) 150 MG tablet Take 1.5 tablets (225 mg total) by mouth at bedtime. Taking 1 & 1/2 tablet at bedtime 135 tablet 1   triamcinolone ointment (KENALOG) 0.5 % Apply 1 Application topically 2 (two) times daily. 100 g 0   venlafaxine XR (EFFEXOR-XR) 150 MG 24 hr capsule Take 1 capsule (150 mg total) by mouth daily with breakfast. 90 capsule 1   FARXIGA 10 MG TABS tablet TAKE ONE TABLET BY MOUTH DAILY AT 9 AM BEFORE  BREAKFAST 90 tablet 0   furosemide (LASIX) 40 MG tablet TAKE ONE TABLET BY MOUTH DAILY AT 9 AM 90 tablet 1   levothyroxine (SYNTHROID) 75 MCG tablet Take 1 tablet (75 mcg total) by mouth daily. Must keep 12/20/22 appt for future refills 30 tablet 0   No facility-administered medications prior to visit.    ROS Review of Systems  Constitutional: Negative.  Negative for chills, diaphoresis, fatigue, fever and unexpected weight change.  HENT: Negative.    Eyes: Negative.   Respiratory:  Negative for cough, chest tightness, shortness of breath and wheezing.   Cardiovascular:  Negative for chest pain, palpitations and leg swelling.  Gastrointestinal:  Negative for abdominal pain, diarrhea, nausea and vomiting.  Endocrine: Negative.   Genitourinary: Negative.   Musculoskeletal: Negative.  Negative for arthralgias and myalgias.  Skin: Negative.   Neurological:  Positive for dizziness. Negative for light-headedness.  Hematological:  Negative for adenopathy. Does not bruise/bleed easily.  Psychiatric/Behavioral: Negative.      Objective:  BP 112/74 (BP Location: Left Arm, Patient Position: Sitting, Cuff Size: Large)   Pulse 85   Temp 98 F (36.7 C) (Oral)   Ht 5\' 2"  (1.575 m)   Wt 182 lb (82.6 kg)   SpO2 95%   BMI 33.29 kg/m  BP Readings from Last 3 Encounters:  12/20/22 112/74  10/19/22 112/80  06/27/22 120/64    Wt Readings from Last 3 Encounters:  12/20/22 182 lb (82.6 kg)  10/19/22 179 lb (81.2 kg)  06/27/22 181 lb (82.1 kg)    Physical Exam Vitals reviewed.  Constitutional:      Appearance: Normal appearance.  HENT:     Nose: Nose normal.     Mouth/Throat:     Mouth: Mucous membranes are moist.  Eyes:     General: No scleral icterus.    Conjunctiva/sclera: Conjunctivae normal.  Cardiovascular:     Rate and Rhythm: Normal rate. Rhythm irregularly irregular.     Heart sounds: No murmur heard.    No gallop.     Comments: EKG- A fib, 94 bpm Inferior/lateral  infarct patterns are old No LVH unchanged Pulmonary:     Effort: Pulmonary effort is normal.     Breath sounds: No stridor. No wheezing, rhonchi or rales.  Abdominal:     General: Abdomen is flat.     Tenderness: There is no abdominal tenderness. There is no guarding or rebound.     Hernia: No hernia is present.  Musculoskeletal:     Right lower leg: No edema.     Left lower leg: No edema.  Skin:    General: Skin is warm and dry.  Neurological:     General: No focal deficit present.     Mental Status: She is alert. Mental status is at baseline.  Psychiatric:        Mood and Affect: Mood normal.        Behavior: Behavior normal.     Lab Results  Component Value Date   WBC 8.4 12/20/2022   HGB 15.9 (H) 12/20/2022   HCT 47.1 (H) 12/20/2022   PLT 279.0 12/20/2022   GLUCOSE 107 (H) 12/20/2022   CHOL 137 03/15/2022   TRIG 114.0 03/15/2022   HDL 57.20 03/15/2022   LDLDIRECT 87.0 01/26/2021   LDLCALC 57 03/15/2022   ALT 25 12/20/2022   AST 24 12/20/2022   NA 140 12/20/2022   K 4.0 12/20/2022   CL 101 12/20/2022   CREATININE 1.03 12/20/2022   BUN 16 12/20/2022   CO2 31 12/20/2022   TSH 0.76 12/20/2022   INR 0.96 09/13/2016   HGBA1C 6.2 12/20/2022   MICROALBUR 5.9 (H) 07/28/2021    MR Brain Wo Contrast  Result Date: 09/06/2021 CLINICAL DATA:  Ataxia, nontraumatic, stroke excluded. Forgetfulness and confusion. EXAM: MRI HEAD WITHOUT CONTRAST TECHNIQUE: Multiplanar, multiecho pulse sequences of the brain and surrounding structures were obtained without intravenous contrast. COMPARISON:  None. FINDINGS: Brain: Diffusion imaging does not show any acute or subacute infarction. Chronic small-vessel ischemic changes affect the pons. No focal cerebellar finding. 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 No cortical or large vessel territory infarction. No  mass lesion, hemorrhage, hydrocephalus or extra-axial collection. Vascular: Major vessels at the base of the brain show flow. Skull and upper cervical spine: Negative Sinuses/Orbits: Clear/normal Other: None IMPRESSION: No acute or reversible finding. Chronic small-vessel ischemic changes of the pons, thalami, basal ganglia and cerebral hemispheric white matter. Electronically Signed   By: Paulina Fusi M.D.   On: 09/06/2021 14:43    Assessment & Plan:   Acquired hypothyroidism- She is euthyroid. -     TSH; Future -     Unithroid; Take 1 tablet (75  mcg total) by mouth daily before breakfast.  Dispense: 90 tablet; Refill: 1  Essential hypertension- Her blood pressure is well-controlled. -     TSH; Future -     Hepatic function panel; Future -     CBC with Differential/Platelet; Future -     EKG 12-Lead  PAF (paroxysmal atrial fibrillation) (HCC) -     TSH; Future  Type 2 diabetes mellitus with stage 3a chronic kidney disease, without long-term current use of insulin (HCC)- Will continue the SGLT2 inhibitor. -     Hemoglobin A1c; Future -     Basic metabolic panel; Future  Dyslipidemia, goal LDL below 70 -     Hepatic function panel; Future  Encounter for general adult medical examination with abnormal findings- Exam completed, labs reviewed, vaccines are up-to-date, no cancer screenings indicated, patient education and anticipatory guidance was given.  Longstanding persistent atrial fibrillation (HCC)- She has good rate control.  Will continue the DOAC.  Chronic diastolic HF (heart failure) (HCC)- She has a normal volume status. -     Dapagliflozin Propanediol; TAKE ONE TABLET BY MOUTH DAILY AT 9 AM BEFORE BREAKFAST  Dispense: 90 tablet; Refill: 1 -     Furosemide; TAKE ONE TABLET BY MOUTH DAILY AT 9 AM  Dispense: 90 tablet; Refill: 1  Type 2 diabetes mellitus with stage 2 chronic kidney disease, without long-term current use of insulin (HCC) -     Dapagliflozin Propanediol; TAKE ONE  TABLET BY MOUTH DAILY AT 9 AM BEFORE BREAKFAST  Dispense: 90 tablet; Refill: 1  Stage 3b chronic kidney disease (HCC)-her renal function is stable. -     Dapagliflozin Propanediol; TAKE ONE TABLET BY MOUTH DAILY AT 9 AM BEFORE BREAKFAST  Dispense: 90 tablet; Refill: 1     Follow-up: Return in about 6 months (around 06/22/2023).  Sanda Linger, MD

## 2022-12-21 ENCOUNTER — Encounter: Payer: Self-pay | Admitting: Internal Medicine

## 2022-12-21 MED ORDER — UNITHROID 75 MCG PO TABS
75.0000 ug | ORAL_TABLET | Freq: Every day | ORAL | 1 refills | Status: DC
Start: 2022-12-21 — End: 2022-12-31

## 2022-12-22 ENCOUNTER — Other Ambulatory Visit: Payer: Self-pay | Admitting: Internal Medicine

## 2022-12-22 DIAGNOSIS — N1832 Chronic kidney disease, stage 3b: Secondary | ICD-10-CM

## 2022-12-22 DIAGNOSIS — I5032 Chronic diastolic (congestive) heart failure: Secondary | ICD-10-CM

## 2022-12-22 DIAGNOSIS — E1122 Type 2 diabetes mellitus with diabetic chronic kidney disease: Secondary | ICD-10-CM

## 2022-12-22 MED ORDER — DAPAGLIFLOZIN PROPANEDIOL 10 MG PO TABS
ORAL_TABLET | ORAL | 1 refills | Status: DC
Start: 2022-12-22 — End: 2023-01-20

## 2022-12-22 MED ORDER — FUROSEMIDE 40 MG PO TABS
ORAL_TABLET | ORAL | 1 refills | Status: DC
Start: 2022-12-22 — End: 2023-01-20

## 2022-12-29 ENCOUNTER — Other Ambulatory Visit: Payer: Self-pay | Admitting: Cardiovascular Disease

## 2022-12-31 ENCOUNTER — Other Ambulatory Visit: Payer: Self-pay | Admitting: Internal Medicine

## 2022-12-31 DIAGNOSIS — E039 Hypothyroidism, unspecified: Secondary | ICD-10-CM

## 2022-12-31 MED ORDER — UNITHROID 75 MCG PO TABS
75.0000 ug | ORAL_TABLET | Freq: Every day | ORAL | 1 refills | Status: DC
Start: 2022-12-31 — End: 2023-06-26

## 2023-01-20 ENCOUNTER — Other Ambulatory Visit: Payer: Self-pay | Admitting: Internal Medicine

## 2023-01-20 DIAGNOSIS — I5032 Chronic diastolic (congestive) heart failure: Secondary | ICD-10-CM

## 2023-01-20 DIAGNOSIS — N182 Chronic kidney disease, stage 2 (mild): Secondary | ICD-10-CM

## 2023-01-20 DIAGNOSIS — N1832 Chronic kidney disease, stage 3b: Secondary | ICD-10-CM

## 2023-02-17 ENCOUNTER — Other Ambulatory Visit: Payer: Self-pay | Admitting: Internal Medicine

## 2023-03-16 ENCOUNTER — Other Ambulatory Visit: Payer: Self-pay | Admitting: Internal Medicine

## 2023-03-16 DIAGNOSIS — I4821 Permanent atrial fibrillation: Secondary | ICD-10-CM

## 2023-03-16 DIAGNOSIS — I10 Essential (primary) hypertension: Secondary | ICD-10-CM

## 2023-03-16 DIAGNOSIS — E876 Hypokalemia: Secondary | ICD-10-CM

## 2023-03-30 ENCOUNTER — Other Ambulatory Visit: Payer: Self-pay | Admitting: Cardiovascular Disease

## 2023-04-21 ENCOUNTER — Other Ambulatory Visit: Payer: Self-pay | Admitting: Cardiovascular Disease

## 2023-05-22 ENCOUNTER — Other Ambulatory Visit: Payer: Self-pay | Admitting: Cardiovascular Disease

## 2023-05-25 ENCOUNTER — Telehealth: Payer: Self-pay | Admitting: Cardiovascular Disease

## 2023-05-25 ENCOUNTER — Encounter: Payer: Self-pay | Admitting: Internal Medicine

## 2023-05-25 ENCOUNTER — Ambulatory Visit: Payer: Medicare HMO | Admitting: Internal Medicine

## 2023-05-25 VITALS — BP 126/78 | HR 93 | Temp 98.2°F | Ht 62.0 in | Wt 180.0 lb

## 2023-05-25 DIAGNOSIS — I4811 Longstanding persistent atrial fibrillation: Secondary | ICD-10-CM | POA: Diagnosis not present

## 2023-05-25 DIAGNOSIS — I10 Essential (primary) hypertension: Secondary | ICD-10-CM | POA: Diagnosis not present

## 2023-05-25 DIAGNOSIS — B028 Zoster with other complications: Secondary | ICD-10-CM

## 2023-05-25 MED ORDER — DILTIAZEM HCL ER COATED BEADS 180 MG PO CP24: 180.0000 mg | ORAL_CAPSULE | Freq: Every day | ORAL | 0 refills | Status: DC

## 2023-05-25 MED ORDER — CAPSAICIN 0.05 % EX CREA: 1.0000 | TOPICAL_CREAM | Freq: Two times a day (BID) | CUTANEOUS | 0 refills | Status: DC | PRN

## 2023-05-25 MED ORDER — VALACYCLOVIR HCL 1 G PO TABS
1000.0000 mg | ORAL_TABLET | Freq: Two times a day (BID) | ORAL | 0 refills | Status: AC
Start: 2023-05-25 — End: 2023-06-01

## 2023-05-25 MED ORDER — PROMETHAZINE HCL 12.5 MG PO TABS
12.5000 mg | ORAL_TABLET | Freq: Four times a day (QID) | ORAL | 0 refills | Status: DC | PRN
Start: 2023-05-25 — End: 2023-08-29

## 2023-05-25 MED ORDER — OXYCODONE HCL 5 MG PO TABS
5.0000 mg | ORAL_TABLET | Freq: Four times a day (QID) | ORAL | 0 refills | Status: DC | PRN
Start: 2023-05-25 — End: 2023-08-29

## 2023-05-25 NOTE — Telephone Encounter (Signed)
*  STAT* If patient is at the pharmacy, call can be transferred to refill team.   1. Which medications need to be refilled? (please list name of each medication and dose if known) diltiazem (CARDIZEM CD) 180 MG 24 hr capsule   2. Which pharmacy/location (including street and city if local pharmacy) is medication to be sent to? [facility] - [place] [address]   3. Do they need a 30 day or 90 day supply? 90

## 2023-05-25 NOTE — Patient Instructions (Signed)
Shingles  Shingles, or herpes zoster, is an infection. It gives you a skin rash and blisters. These infected areas may hurt a lot. Shingles only happens if: You've had chickenpox. You've been given a shot called a vaccine to protect you from getting chickenpox. Shingles is rare in this case. What are the causes? Shingles is caused by a germ called the varicella-zoster virus. This is the same germ that causes chickenpox. After you're exposed to the germ, it stays in your body but is dormant. This means it isn't active. Shingles happens if the germ becomes active again. This can happen years after you're first exposed to the germ. What increases the risk? You may be more likely to get shingles if: You're older than 87 years of age. You're under a lot of stress. You have a weak immune system. The immune system is your body's defense system. It may be weak if: You have human immunodeficiency virus (HIV). You have acquired immunodeficiency syndrome (AIDS). You have cancer. You take medicines that weaken your immune system. These include organ transplant medicines. What are the signs or symptoms? The first symptoms of shingles may be itching, tingling, or pain. Your skin may feel like it's burning. A few days or weeks later, you'll get a rash. Here's what you can expect: The rash is likely to be on one side of your body. The rash may be shaped like a belt or a band. Over time, it will turn into blisters filled with fluid. The blisters will break open and change into scabs. The scabs will dry up in about 2-3 weeks. You may also have: A fever. Chills. A headache. Nausea. How is this diagnosed? Shingles is diagnosed with a skin exam. A sample called a culture may be taken from one of your blisters and sent to a lab. This will show if you have shingles. How is this treated? The rash may last for several weeks. There's no cure for shingles, but your health care provider may give you medicines.  These medicines may: Help with pain. Help with itching. Help with irritation and swelling. Help you get better sooner. Help to prevent long-term problems. If the rash is on your face, you may need to see an eye doctor or an ear, nose, and throat (ENT) doctor. Follow these instructions at home: Medicines Take your medicines only as told by your provider. Put an anti-itch cream or numbing cream on the rash or blisters as told by your provider. Relieving itching and discomfort  To help with itching: Put cold, wet cloths called cold compresses on the rash or blisters. Take a cool bath. Try adding baking soda or dry oatmeal to the water. Do not bathe in hot water. Use calamine lotion on the rash or blisters. You can get this type of lotion at the store. Blister and rash care Keep your rash covered with a loose bandage. Wear loose clothes that don't rub on your rash. Take care of your rash as told by your provider. Make sure you: Wash your hands with soap and water for at least 20 seconds before and after you change your bandage. If you can't use soap and water, use hand sanitizer. Keep your rash and blisters clean by washing them with mild soap and cool water. Change your bandage. Check your rash every day for signs of infection. Check for: More redness, swelling, or pain. Fluid or blood. Warmth. Pus or a bad smell. Do not scratch your rash. Do not pick at your  blisters. To help you not scratch: Keep your fingernails clean and cut short. Try to wear gloves or mittens when you sleep. General instructions Rest. Wash your hands often with soap and water for at least 20 seconds. If you can't use soap and water, use hand sanitizer. Washing your hands lowers your chance of getting a skin infection. Your infection can cause chickenpox in others. If you have blisters that aren't scabs yet, stay away from: Babies. Pregnant people. Children who have eczema. Older people who have organ  transplants. People who have a long-term, or chronic, illness. Anyone who hasn't had chickenpox before. Anyone who hasn't gotten the chickenpox vaccine. How is this prevented? Vaccines are the best way to prevent you from getting chickenpox or shingles. Talk with your provider about getting these shots. Where to find more information Centers for Disease Control and Prevention (CDC): [employer]erProm[employer].no Contact a health care provider if: Your pain doesn't get better with medicine. Your pain doesn't get better after the rash heals. You have any signs of infection around the rash. Your rash or blisters get worse. You have a fever or chills. Get help right away if: The rash is on your face or nose. You have pain in your face or by your eye. You lose feeling on one side of your face. You have trouble seeing. You have ear pain or ringing in your ear. This information is not intended to replace advice given to you by your health care provider. Make sure you discuss any questions you have with your health care provider. Document Revised: 09/16/2022 Document Reviewed: 09/16/2022 Elsevier Patient Education  2024 [employer].

## 2023-05-25 NOTE — Telephone Encounter (Signed)
Pt's medication was sent to pt's pharmacy as requested. Confirmation received.  °

## 2023-05-25 NOTE — Progress Notes (Signed)
Subjective:  Patient ID: [name] [name], female    DOB: Feb 07, 1934  Age: 89 y.o. MRN: [id number]  CC: Rash and Atrial Fibrillation   HPI [name] [name] presents for f/up ---  She complains of a 1 to 2-week history of painful rash with stinging, burning, and itching that wraps from the left flank area into the left groin.  Outpatient Medications Prior to Visit  Medication Sig Dispense Refill   acetaminophen (TYLENOL) 650 MG CR tablet Take 1,300 mg by mouth every 8 (eight) hours as needed for pain.     apixaban (ELIQUIS) 5 MG TABS tablet Take 1 tablet (5 mg total) by mouth 2 (two) times daily. 180 tablet 1   atorvastatin (LIPITOR) 40 MG tablet TAKE ONE TABLET BY MOUTH DAILY AT 5 PM 90 tablet 3   clobetasol (TEMOVATE) 0.05 % external solution APPLY ONE application TWICE DAILY 50 mL 11   diltiazem (CARDIZEM CD) 300 MG 24 hr capsule TAKE 1 CAPSULE (300 MG TOTAL) BY MOUTH DAILY. MUST KEEP APPT FOR ADDITIONAL REFILLS 90 capsule 0   FARXIGA 10 MG TABS tablet TAKE ONE TABLET BY MOUTH DAILY AT 9 AM before breakfast 90 tablet 0   furosemide (LASIX) 40 MG tablet TAKE ONE TABLET BY MOUTH DAILY AT 9 AM 90 tablet 0   isosorbide mononitrate (IMDUR) 120 MG 24 hr tablet Take 120 mg by mouth Daily.      losartan (COZAAR) 100 MG tablet Take 100 mg by mouth daily.     Potassium Chloride ER (KLOR-CON M15) 15 MEQ TBCR TAKE 1 TABLET BY MOUTH TWICE A DAY KEEP APPT FOR ADDITIONAL REFILLS 180 tablet 0   selenium sulfide (SELSUN) 2.5 % shampoo Apply 1 Application topically daily as needed for irritation. 118 mL 12   traZODone (DESYREL) 150 MG tablet Take 1.5 tablets (225 mg total) by mouth at bedtime. Taking 1 & 1/2 tablet at bedtime 135 tablet 1   triamcinolone ointment (KENALOG) 0.5 % APPLY TO AFFECTED AREA TWICE A DAY 90 g 0   UNITHROID 75 MCG tablet Take 1 tablet (75 mcg total) by mouth daily before breakfast. 90 tablet 1   venlafaxine XR (EFFEXOR-XR) 150 MG 24 hr capsule Take 1 capsule (150 mg total) by  mouth daily with breakfast. 90 capsule 1   diltiazem (CARDIZEM CD) 180 MG 24 hr capsule TAKE ONE CAPSULE (180 MG TOTAL) BY MOUTH DAILY AT 9 AM *PATIENT NEEDS TO MAKE APPOINTMENT WITH PROVIDER FOR FURTHER REFILLS 30 capsule 0   No facility-administered medications prior to visit.    ROS Review of Systems  Constitutional:  Negative for chills, diaphoresis and fatigue.  HENT: Negative.    Eyes:  Negative for visual disturbance.  Respiratory:  Negative for cough, chest tightness, shortness of breath and wheezing.   Cardiovascular:  Negative for chest pain, palpitations and leg swelling.  Gastrointestinal:  Positive for nausea. Negative for abdominal pain, diarrhea and vomiting.  Endocrine: Negative for polyuria.  Genitourinary:  Negative for difficulty urinating.  Musculoskeletal: Negative.  Negative for arthralgias and myalgias.  Skin:  Positive for color change and rash.  Neurological: Negative.  Negative for dizziness, weakness and light-headedness.  Hematological:  Negative for adenopathy. Does not bruise/bleed easily.  Psychiatric/Behavioral: Negative.      Objective:  BP 126/78 (BP Location: Right Arm, Patient Position: Sitting, Cuff Size: Large)   Pulse 93   Temp 98.2 F (36.8 C) (Oral)   Ht 5\' 2"  (1.575 m)   Wt 180  lb (81.6 kg)   SpO2 96%   BMI 32.92 kg/m   BP Readings from Last 3 Encounters:  05/25/23 126/78  12/20/22 112/74  10/19/22 112/80    Wt Readings from Last 3 Encounters:  05/25/23 180 lb (81.6 kg)  12/20/22 182 lb (82.6 kg)  10/19/22 179 lb (81.2 kg)    Physical Exam Vitals reviewed.  HENT:     Mouth/Throat:     Mouth: Mucous membranes are moist.  Eyes:     General: No scleral icterus.    Conjunctiva/sclera: Conjunctivae normal.  Cardiovascular:     Rate and Rhythm: Normal rate. Rhythm irregularly irregular.     Pulses: Normal pulses.     Heart sounds: No murmur heard. Pulmonary:     Effort: Pulmonary effort is normal.     Breath sounds: No  stridor. No wheezing, rhonchi or rales.  Abdominal:     General: Abdomen is flat.     Palpations: There is no mass.     Tenderness: There is no abdominal tenderness. There is no guarding.     Hernia: No hernia is present.  Musculoskeletal:        General: Normal [name] of motion.     Cervical back: Neck supple.     Right lower leg: No edema.  Lymphadenopathy:     Cervical: No cervical adenopathy.  Skin:    Coloration: Skin is not pale.     Findings: Erythema and rash present. Rash is macular, papular and vesicular. Rash is not crusting, pustular or urticarial.  Neurological:     General: No focal deficit present.     Mental Status: She is alert. Mental status is at baseline.  Psychiatric:        Mood and Affect: Mood normal.        Behavior: Behavior normal.     Lab Results  Component Value Date   WBC 8.4 12/20/2022   HGB 15.9 (H) 12/20/2022   HCT 47.1 (H) 12/20/2022   PLT 279.0 12/20/2022   GLUCOSE 107 (H) 12/20/2022   CHOL 137 03/15/2022   TRIG 114.0 03/15/2022   HDL 57.20 03/15/2022   LDLDIRECT 87.0 01/26/2021   LDLCALC 57 03/15/2022   ALT 25 12/20/2022   AST 24 12/20/2022   NA 140 12/20/2022   K 4.0 12/20/2022   CL 101 12/20/2022   CREATININE 1.03 12/20/2022   BUN 16 12/20/2022   CO2 31 12/20/2022   TSH 0.76 12/20/2022   INR 0.96 09/13/2016   HGBA1C 6.2 12/20/2022   MICROALBUR 5.9 (H) 07/28/2021    MR Brain Wo Contrast  Result Date: 09/06/2021 CLINICAL DATA:  Ataxia, nontraumatic, stroke excluded. Forgetfulness and confusion. EXAM: MRI HEAD WITHOUT CONTRAST TECHNIQUE: Multiplanar, multiecho pulse sequences of the brain and surrounding structures were obtained without intravenous contrast. COMPARISON:  None. FINDINGS: Brain: Diffusion imaging does not show any acute or subacute infarction. Chronic small-vessel ischemic changes affect the pons. No focal cerebellar finding. 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 No cortical or large vessel territory infarction. No mass lesion, hemorrhage, hydrocephalus or extra-axial collection. Vascular: Major vessels at the base of the brain show flow. Skull and upper cervical spine: Negative Sinuses/Orbits: Clear/normal Other: None IMPRESSION: No acute or reversible finding. Chronic small-vessel ischemic changes of the pons, thalami, basal ganglia and cerebral hemispheric white matter. Electronically Signed   By: Paulina Fusi M.D.   On: 09/06/2021 14:43  Assessment & Plan:   Herpes zoster with other complication- I think she has presented too late to benefit from systemic steroids. -     valACYclovir HCl; Take 1 tablet (1,000 mg total) by mouth 2 (two) times daily for 7 days.  Dispense: 14 tablet; Refill: 0 -     oxyCODONE HCl; Take 1 tablet (5 mg total) by mouth every 6 (six) hours as needed for severe pain.  Dispense: 30 tablet; Refill: 0 -     Capsaicin; Apply 1 Act topically 2 (two) times daily as needed.  Dispense: 57 g; Refill: 0 -     Promethazine HCl; Take 1 tablet (12.5 mg total) by mouth every 6 (six) hours as needed for nausea or vomiting.  Dispense: 30 tablet; Refill: 0  Longstanding persistent atrial fibrillation (HCC)- She has good rate control.  Essential hypertension- Her blood pressure is well-controlled.     Follow-up: Return in about 3 months (around 08/25/2023).  Sanda Linger, MD

## 2023-05-29 ENCOUNTER — Other Ambulatory Visit: Payer: Self-pay | Admitting: Cardiovascular Disease

## 2023-05-29 DIAGNOSIS — I4821 Permanent atrial fibrillation: Secondary | ICD-10-CM

## 2023-06-18 ENCOUNTER — Other Ambulatory Visit: Payer: Self-pay | Admitting: Internal Medicine

## 2023-06-18 DIAGNOSIS — I10 Essential (primary) hypertension: Secondary | ICD-10-CM

## 2023-06-18 DIAGNOSIS — I4821 Permanent atrial fibrillation: Secondary | ICD-10-CM

## 2023-06-18 DIAGNOSIS — I5032 Chronic diastolic (congestive) heart failure: Secondary | ICD-10-CM

## 2023-06-18 DIAGNOSIS — E1122 Type 2 diabetes mellitus with diabetic chronic kidney disease: Secondary | ICD-10-CM

## 2023-06-18 DIAGNOSIS — N1832 Chronic kidney disease, stage 3b: Secondary | ICD-10-CM

## 2023-06-18 DIAGNOSIS — E876 Hypokalemia: Secondary | ICD-10-CM

## 2023-06-20 ENCOUNTER — Ambulatory Visit: Payer: Medicare HMO | Admitting: Internal Medicine

## 2023-06-26 ENCOUNTER — Other Ambulatory Visit: Payer: Self-pay | Admitting: Internal Medicine

## 2023-06-26 DIAGNOSIS — E039 Hypothyroidism, unspecified: Secondary | ICD-10-CM

## 2023-07-03 ENCOUNTER — Other Ambulatory Visit: Payer: Self-pay | Admitting: Cardiovascular Disease

## 2023-07-19 ENCOUNTER — Other Ambulatory Visit: Payer: Self-pay | Admitting: Cardiovascular Disease

## 2023-07-21 MED ORDER — DILTIAZEM HCL ER COATED BEADS 180 MG PO CP24: 180.0000 mg | ORAL_CAPSULE | Freq: Every day | ORAL | 3 refills | Status: DC

## 2023-07-21 NOTE — Telephone Encounter (Signed)
Spoke with [name] (dpr) follow up appointment has been made and Refill sent to pharmacy

## 2023-07-21 NOTE — Telephone Encounter (Signed)
Refill for review-pt has had 3 attempts and has not scheduled f/u appt

## 2023-08-20 ENCOUNTER — Other Ambulatory Visit: Payer: Self-pay | Admitting: Internal Medicine

## 2023-08-25 ENCOUNTER — Other Ambulatory Visit: Payer: Self-pay | Admitting: Cardiovascular Disease

## 2023-08-25 DIAGNOSIS — E785 Hyperlipidemia, unspecified: Secondary | ICD-10-CM

## 2023-08-29 ENCOUNTER — Encounter: Payer: Self-pay | Admitting: Internal Medicine

## 2023-08-29 ENCOUNTER — Ambulatory Visit: Payer: Medicare HMO | Admitting: Internal Medicine

## 2023-08-29 VITALS — BP 124/78 | HR 74 | Temp 97.7°F | Ht 62.0 in | Wt 178.0 lb

## 2023-08-29 DIAGNOSIS — Z23 Encounter for immunization: Secondary | ICD-10-CM

## 2023-08-29 DIAGNOSIS — N1832 Chronic kidney disease, stage 3b: Secondary | ICD-10-CM

## 2023-08-29 DIAGNOSIS — N3001 Acute cystitis with hematuria: Secondary | ICD-10-CM

## 2023-08-29 DIAGNOSIS — E1122 Type 2 diabetes mellitus with diabetic chronic kidney disease: Secondary | ICD-10-CM

## 2023-08-29 DIAGNOSIS — L219 Seborrheic dermatitis, unspecified: Secondary | ICD-10-CM | POA: Diagnosis not present

## 2023-08-29 DIAGNOSIS — N1831 Chronic kidney disease, stage 3a: Secondary | ICD-10-CM

## 2023-08-29 DIAGNOSIS — E785 Hyperlipidemia, unspecified: Secondary | ICD-10-CM | POA: Diagnosis not present

## 2023-08-29 DIAGNOSIS — I5032 Chronic diastolic (congestive) heart failure: Secondary | ICD-10-CM | POA: Diagnosis not present

## 2023-08-29 DIAGNOSIS — E039 Hypothyroidism, unspecified: Secondary | ICD-10-CM | POA: Diagnosis not present

## 2023-08-29 DIAGNOSIS — I4821 Permanent atrial fibrillation: Secondary | ICD-10-CM

## 2023-08-29 LAB — BASIC METABOLIC PANEL
BUN: 13 mg/dL (ref 6–23)
CO2: 29 meq/L (ref 19–32)
Calcium: 9.9 mg/dL (ref 8.4–10.5)
Chloride: 102 meq/L (ref 96–112)
Creatinine, Ser: 0.83 mg/dL (ref 0.40–1.20)
GFR: 63.16 mL/min (ref >60.00)
Glucose, Bld: 84 mg/dL (ref 70–99)
Potassium: 4 meq/L (ref 3.5–5.1)
Sodium: 140 meq/L (ref 135–145)

## 2023-08-29 LAB — CBC WITH DIFFERENTIAL/PLATELET
Basophils Absolute: 0 10*3/uL (ref 0.0–0.1)
Basophils Relative: 0.5 % (ref 0.0–3.0)
Eosinophils Absolute: 0.1 10*3/uL (ref 0.0–0.7)
Eosinophils Relative: 1.4 % (ref 0.0–5.0)
HCT: 48.1 % — ABNORMAL HIGH (ref 36.0–46.0)
Hemoglobin: 16.1 g/dL — ABNORMAL HIGH (ref 12.0–15.0)
Lymphocytes Relative: 27.6 % (ref 12.0–46.0)
Lymphs Abs: 2.3 10*3/uL (ref 0.7–4.0)
MCHC: 33.4 g/dL (ref 30.0–36.0)
MCV: 93.5 fL (ref 78.0–100.0)
Monocytes Absolute: 0.7 10*3/uL (ref 0.1–1.0)
Monocytes Relative: 8.8 % (ref 3.0–12.0)
Neutro Abs: 5.2 10*3/uL (ref 1.4–7.7)
Neutrophils Relative %: 61.7 % (ref 43.0–77.0)
Platelets: 288 10*3/uL (ref 150.0–400.0)
RBC: 5.15 Mil/uL — ABNORMAL HIGH (ref 3.87–5.11)
RDW: 13.8 % (ref 11.5–15.5)
WBC: 8.4 10*3/uL (ref 4.0–10.5)

## 2023-08-29 LAB — LIPID PANEL
Cholesterol: 145 mg/dL (ref 0–200)
HDL: 55.9 mg/dL (ref >39.00)
LDL Cholesterol: 64 mg/dL (ref 0–99)
NonHDL: 89.16
Total CHOL/HDL Ratio: 3
Triglycerides: 126 mg/dL (ref 0.0–149.0)
VLDL: 25.2 mg/dL (ref 0.0–40.0)

## 2023-08-29 LAB — HEMOGLOBIN A1C: Hgb A1c MFr Bld: 6.1 % (ref 4.6–6.5)

## 2023-08-29 LAB — TSH: TSH: 0.85 u[IU]/mL (ref 0.35–5.50)

## 2023-08-29 MED ORDER — KETOCONAZOLE 2 % EX SHAM: 1.0000 | MEDICATED_SHAMPOO | CUTANEOUS | 2 refills | Status: AC

## 2023-08-29 MED ORDER — FLUOCINOLONE ACETONIDE SCALP 0.01 % EX OIL: 1.0000 | TOPICAL_OIL | Freq: Every day | CUTANEOUS | 1 refills | Status: AC

## 2023-08-29 NOTE — Patient Instructions (Signed)
 Atrial Fibrillation Atrial fibrillation (AFib) is a type of irregular or rapid heartbeat (arrhythmia). In AFib, the top part of the heart (atria) beats in an irregular pattern. This makes the heart unable to pump blood normally and effectively. The goal of treatment is to prevent blood clots from forming, control your heart rate, or restore your heartbeat to a normal rhythm. If this condition is not treated, it can cause serious problems, such as a weakened heart muscle (cardiomyopathy) or a stroke. What are the causes? This condition is often caused by medical conditions that damage the heart's electrical system. These include: High blood pressure (hypertension). This is the most common cause. Certain heart problems or conditions, such as heart failure, coronary artery disease, heart valve problems, or heart surgery. Diabetes. Overactive thyroid  (hyperthyroidism). Chronic kidney disease. Certain lung conditions, such as emphysema, pneumonia, or COPD. Obstructive sleep apnea. In some cases, the cause of this condition is not known. What increases the risk? This condition is more likely to develop in: Older adults. Athletes who do endurance exercise. People who have a family history of AFib. Males. People who are Caucasian. People who are obese. People who smoke or misuse alcohol. What are the signs or symptoms? Symptoms of this condition include: Fast or irregular heartbeats (palpitations). Discomfort or pain in your chest. Shortness of breath. Sudden light-headedness or weakness. Tiring easily during exercise or activity. Syncope (fainting). Sweating. In some cases, there are no symptoms. How is this diagnosed? Your health care provider may detect AFib when taking your pulse. If detected, this condition may be diagnosed with: An electrocardiogram (ECG) to check electrical signals of the heart. An ambulatory cardiac monitor to record your heart's activity for a few days. A  transthoracic echocardiogram (TTE) to create pictures of your heart. A transesophageal echocardiogram (TEE) to create even clearer pictures of your heart. A stress test to check your blood supply while you exercise. Imaging tests, such as a CT scan or chest X-ray. Blood tests. How is this treated? Treatment depends on underlying conditions and how you feel when you get AFib. This condition may be treated with: Medicines to prevent blood clots or to treat heart rate or heart rhythm problems. Electrical cardioversion to reset the heart's rhythm. A pacemaker to correct abnormal heart rhythm. Ablation to remove the heart tissue that sends abnormal signals. Left atrial appendage closure to seal the area where blood clots can form. In some cases, underlying conditions will be treated. Follow these instructions at home: Medicines Take over-the counter and prescription medicines only as told by your provider. Do not take any new medicines without talking to your provider. If you are taking blood thinners: Talk with your provider before taking aspirin  or NSAIDs. These medicines can raise your risk of bleeding. Take your medicines as told. Take them at the same time each day. Do not do things that could hurt or bruise you. Be careful to avoid falls. Wear an alert bracelet or carry a card that says that you take blood thinners. Lifestyle Do not use any products that contain nicotine or tobacco. These products include cigarettes, chewing tobacco, and vaping devices, such as e-cigarettes. If you need help quitting, ask your provider. Eat heart-healthy foods. Talk with a food expert (dietitian) to make an eating plan that is right for you. Exercise regularly as told by your provider. Do not drink alcohol. Lose weight if you are overweight. General instructions If you have obstructive sleep apnea, manage your condition as told by your provider.  Do not use diet pills unless your provider approves. Diet  pills can make heart problems worse. Keep all follow-up visits. Your provider will want to check your heart rate and rhythm regularly. Contact a health care provider if: You notice a change in the rate, rhythm, or strength of your heartbeat. You are taking a blood thinner and you notice more bruising. You tire more easily when you exercise or do heavy work. You have a sudden change in weight. Get help right away if:  You have chest pain. You have trouble breathing. You have side effects of blood thinners, such as blood in your vomit, poop (stool), or pee (urine), or bleeding that does not stop. You have any symptoms of a stroke. BE FAST is an easy way to remember the main warning signs of a stroke: B - Balance. Signs are dizziness, sudden trouble walking, or loss of balance. E - Eyes. Signs are trouble seeing or a sudden change in vision. F - Face. Signs are sudden weakness or numbness of the face, or the face or eyelid drooping on one side. A - Arms. Signs are weakness or numbness in an arm. This happens suddenly and usually on one side of the body. S - Speech.Signs are sudden trouble speaking, slurred speech, or trouble understanding what people say. T - Time. Time to call emergency services. Write down what time symptoms started. Other signs of a stroke, such as: A sudden, severe headache with no known cause. Nausea or vomiting. Seizure. These symptoms may be an emergency. Get help right away. Call 911. Do not wait to see if the symptoms will go away. Do not drive yourself to the hospital. This information is not intended to replace advice given to you by your health care provider. Make sure you discuss any questions you have with your health care provider. Document Revised: 04/20/2022 Document Reviewed: 04/20/2022 Elsevier Patient Education  2024 [employer].

## 2023-08-29 NOTE — Progress Notes (Signed)
 Subjective:  Patient ID: [name] [name], female    DOB: 01/01/1934  Age: 89 y.o. MRN: [id number]  CC: Rash, Atrial Fibrillation, Congestive Heart Failure, Hypothyroidism, and Diabetes   HPI [name] [name] presents for f/up ----  Discussed the use of AI scribe software for clinical note transcription with the patient, who gave verbal consent to proceed.  History of Present Illness   The patient, with a known history of atrial fibrillation and thyroid  disease, presents with a chief complaint of persistent daytime sleepiness. She reports feeling sleepy throughout the day, despite adequate sleep at night. She denies any associated chest pain, shortness of breath, dizziness, or lightheadedness. She also denies any recent changes in blood pressure or blood sugar levels. There is no reported swelling in the legs or feet.  The patient's thyroid  medication appears to be well-tolerated, with no reported symptoms of fatigue or constipation. However, she has noticed a rash, which she attributes to her medication. She denies any discomfort or swelling associated with the rash.  The patient's atrial fibrillation is reportedly asymptomatic, with no complaints of heart racing or discomfort. She only becomes aware of her condition during medical consultations.  In addition, the patient had a recent bout of shingles, which has since resolved without any lingering pain or complications. She also reports an intermittent itchy rash on her ears and the back of her head, which she sometimes forgets to treat with her prescribed topical cream.       Outpatient Medications Prior to Visit  Medication Sig Dispense Refill   acetaminophen  (TYLENOL ) 650 MG CR tablet Take 1,300 mg by mouth every 8 (eight) hours as needed for pain.     apixaban  (ELIQUIS ) 5 MG TABS tablet TAKE ONE TABLET (5 MG TOTAL) BY MOUTH TWICE DAILY @ 9AM & 5PM 30 tablet 0   atorvastatin  (LIPITOR ) 40 MG tablet TAKE ONE TABLET BY MOUTH DAILY AT 5  PM 60 tablet 0   Capsaicin  0.05 % CREA Apply 1 Act topically 2 (two) times daily as needed. 57 g 0   diltiazem  (CARDIZEM  CD) 180 MG 24 hr capsule Take 1 capsule (180 mg total) by mouth daily. 90 capsule 3   FARXIGA  10 MG TABS tablet TAKE ONE TABLET BY MOUTH DAILY AT 9 AM BEFORE BREAKFAST 90 tablet 1   furosemide  (LASIX ) 40 MG tablet TAKE ONE TABLET BY MOUTH DAILY AT 9 AM 90 tablet 1   isosorbide  mononitrate (IMDUR ) 120 MG 24 hr tablet Take 120 mg by mouth Daily.      levothyroxine  (SYNTHROID ) 75 MCG tablet TAKE 1 TABLET BY MOUTH DAILY BEFORE BREAKFAST. 90 tablet 0   losartan  (COZAAR ) 100 MG tablet Take 100 mg by mouth daily.     potassium chloride  SA (KLOR-CON  M15) 15 MEQ tablet TAKE 1 TABLET BY MOUTH TWICE A DAY KEEP APPT FOR ADDITIONAL REFILLS 180 tablet 1   traZODone  (DESYREL ) 150 MG tablet Take 1.5 tablets (225 mg total) by mouth at bedtime. Taking 1 & 1/2 tablet at bedtime 135 tablet 1   venlafaxine  XR (EFFEXOR -XR) 150 MG 24 hr capsule Take 1 capsule (150 mg total) by mouth daily with breakfast. 90 capsule 1   clobetasol  (TEMOVATE ) 0.05 % external solution APPLY ONE application TWICE DAILY 50 mL 11   oxyCODONE  (OXY IR/ROXICODONE ) 5 MG immediate release tablet Take 1 tablet (5 mg total) by mouth every 6 (six) hours as needed for severe pain. 30 tablet 0   promethazine  (PHENERGAN ) 12.5  MG tablet Take 1 tablet (12.5 mg total) by mouth every 6 (six) hours as needed for nausea or vomiting. 30 tablet 0   selenium  sulfide (SELSUN ) 2.5 % shampoo Apply 1 Application topically daily as needed for irritation. 118 mL 12   triamcinolone  ointment (KENALOG ) 0.5 % APPLY TO AFFECTED AREA TWICE A DAY 90 g 0   No facility-administered medications prior to visit.    ROS Review of Systems  Constitutional: Negative.  Negative for chills, diaphoresis, fatigue and fever.  HENT: Negative.    Eyes: Negative.   Respiratory: Negative.  Negative for chest tightness, shortness of breath and wheezing.    Cardiovascular: Negative.  Negative for chest pain, palpitations and leg swelling.  Gastrointestinal: Negative.  Negative for abdominal pain, blood in stool, constipation, diarrhea, nausea and vomiting.  Genitourinary:  Positive for dysuria. Negative for difficulty urinating, flank pain, frequency and hematuria.  Musculoskeletal: Negative.  Negative for arthralgias and myalgias.  Skin:  Positive for rash. Negative for color change.  Neurological: Negative.  Negative for dizziness and weakness.  Hematological:  Negative for adenopathy. Does not bruise/bleed easily.  Psychiatric/Behavioral:  Positive for behavioral problems and confusion.     Objective:  BP 124/78 (BP Location: Right Arm, Patient Position: Sitting, Cuff Size: Normal)   Pulse 74   Temp 97.7 F (36.5 C) (Oral)   Ht 5' 2 (1.575 m)   Wt 178 lb (80.7 kg)   SpO2 98%   BMI 32.56 kg/m   BP Readings from Last 3 Encounters:  08/29/23 124/78  05/25/23 126/78  12/20/22 112/74    Wt Readings from Last 3 Encounters:  08/29/23 178 lb (80.7 kg)  05/25/23 180 lb (81.6 kg)  12/20/22 182 lb (82.6 kg)    Physical Exam Vitals reviewed.  Constitutional:      Appearance: She is not ill-appearing.  HENT:     Nose: Nose normal.     Mouth/Throat:     Mouth: Mucous membranes are moist.  Eyes:     General: No scleral icterus.    Conjunctiva/sclera: Conjunctivae normal.  Cardiovascular:     Rate and Rhythm: Normal rate. Rhythm irregularly irregular.     Heart sounds: Normal heart sounds, S1 normal and S2 normal. No murmur heard.    No friction rub. No gallop.     Comments: EKG- A fib, 96 bpm No LVH, Q waves, or ST/T wave changes  Pulmonary:     Effort: Pulmonary effort is normal.     Breath sounds: No stridor. No wheezing, rhonchi or rales.  Abdominal:     General: Abdomen is flat.     Palpations: There is no mass.     Tenderness: There is no abdominal tenderness. There is no guarding.     Hernia: No hernia is present.   Musculoskeletal:     Cervical back: Neck supple.     Right lower leg: No edema.     Left lower leg: No edema.  Lymphadenopathy:     Cervical: No cervical adenopathy.  Skin:    General: Skin is warm and dry.     Findings: Rash present. No erythema.  Neurological:     General: No focal deficit present.     Mental Status: She is alert. Mental status is at baseline.  Psychiatric:        Mood and Affect: Mood normal.        Behavior: Behavior normal.     Lab Results  Component Value Date   WBC 8.4  08/29/2023   HGB 16.1 (H) 08/29/2023   HCT 48.1 (H) 08/29/2023   PLT 288.0 08/29/2023   GLUCOSE 84 08/29/2023   CHOL 145 08/29/2023   TRIG 126.0 08/29/2023   HDL 55.90 08/29/2023   LDLDIRECT 87.0 01/26/2021   LDLCALC 64 08/29/2023   ALT 25 12/20/2022   AST 24 12/20/2022   NA 140 08/29/2023   K 4.0 08/29/2023   CL 102 08/29/2023   CREATININE 0.83 08/29/2023   BUN 13 08/29/2023   CO2 29 08/29/2023   TSH 0.85 08/29/2023   INR 0.96 09/13/2016   HGBA1C 6.1 08/29/2023   MICROALBUR 6.6 (H) 08/29/2023    MR Brain Wo Contrast Result Date: 09/06/2021 CLINICAL DATA:  Ataxia, nontraumatic, stroke excluded. Forgetfulness and confusion. EXAM: MRI HEAD WITHOUT CONTRAST TECHNIQUE: Multiplanar, multiecho pulse sequences of the brain and surrounding structures were obtained without intravenous contrast. COMPARISON:  None. FINDINGS: Brain: Diffusion imaging does not show any acute or subacute infarction. Chronic small-vessel ischemic changes affect the pons. No focal cerebellar finding. 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 No cortical or large vessel territory infarction. No mass lesion, hemorrhage, hydrocephalus or extra-axial collection. Vascular: Major vessels at the base of the brain show flow. Skull and upper cervical spine: Negative Sinuses/Orbits: Clear/normal  Other: None IMPRESSION: No acute or reversible finding. Chronic small-vessel ischemic changes of the pons, thalami, basal ganglia and cerebral hemispheric white matter. Electronically Signed   By: Oneil Officer M.D.   On: 09/06/2021 14:43    Assessment & Plan:   Seborrheic dermatitis of scalp -     Fluocinolone  Acetonide Scalp; Apply 1 Act topically at bedtime.  Dispense: 118.28 mL; Refill: 1 -     Ketoconazole ; Apply 1 Application topically 2 (two) times a week.  Dispense: 120 mL; Refill: 2  Acquired hypothyroidism- She is euthyroid. -     CBC with Differential/Platelet; Future -     TSH; Future  Dyslipidemia, goal LDL below 70 -     Lipid panel; Future -     TSH; Future  Chronic diastolic HF (heart failure) (HCC)- No signs of fluid overload.  Permanent atrial fibrillation (HCC) -     CBC with Differential/Platelet; Future -     EKG 12-Lead  Type 2 diabetes mellitus with stage 3a chronic kidney disease, without long-term current use of insulin  (HCC) -     Basic metabolic panel; Future -     CBC with Differential/Platelet; Future -     Urinalysis, Routine w reflex microscopic; Future -     Hemoglobin A1c; Future -     Microalbumin / creatinine urine ratio; Future -     HM Diabetes Foot Exam  Stage 3b chronic kidney disease (HCC)- Will avoid nephrotoxic agents  -     Basic metabolic panel; Future -     Urinalysis, Routine w reflex microscopic; Future -     Microalbumin / creatinine urine ratio; Future  Flu vaccine need -     Flu Vaccine Trivalent High Dose (Fluad)  Acute cystitis with hematuria -     Sulfamethoxazole -Trimethoprim ; Take 1 tablet by mouth 2 (two) times daily for 3 days.  Dispense: 6 tablet; Refill: 0     Follow-up: Return in about 6 months (around 02/26/2024).  Debby Molt, MD

## 2023-08-30 LAB — URINALYSIS, ROUTINE W REFLEX MICROSCOPIC
Bilirubin Urine: NEGATIVE
Nitrite: POSITIVE — AB
Specific Gravity, Urine: 1.02 (ref 1.000–1.030)
Urine Glucose: 100 — AB
Urobilinogen, UA: 0.2 (ref 0.0–1.0)
pH: 6 (ref 5.0–8.0)

## 2023-08-30 LAB — MICROALBUMIN / CREATININE URINE RATIO
Creatinine,U: 163.7 mg/dL
Microalb Creat Ratio: 4 mg/g (ref 0.0–30.0)
Microalb, Ur: 6.6 mg/dL — ABNORMAL HIGH (ref 0.0–1.9)

## 2023-08-30 MED ORDER — SULFAMETHOXAZOLE-TRIMETHOPRIM 800-160 MG PO TABS: 1.0000 | ORAL_TABLET | Freq: Two times a day (BID) | ORAL | 0 refills | Status: AC

## 2023-09-13 ENCOUNTER — Other Ambulatory Visit: Payer: Self-pay | Admitting: Internal Medicine

## 2023-09-13 DIAGNOSIS — I4821 Permanent atrial fibrillation: Secondary | ICD-10-CM

## 2023-09-23 ENCOUNTER — Other Ambulatory Visit: Payer: Self-pay | Admitting: Internal Medicine

## 2023-09-23 DIAGNOSIS — I10 Essential (primary) hypertension: Secondary | ICD-10-CM

## 2023-09-23 DIAGNOSIS — E876 Hypokalemia: Secondary | ICD-10-CM

## 2023-09-29 ENCOUNTER — Other Ambulatory Visit: Payer: Self-pay | Admitting: Internal Medicine

## 2023-09-29 DIAGNOSIS — E1122 Type 2 diabetes mellitus with diabetic chronic kidney disease: Secondary | ICD-10-CM

## 2023-09-29 DIAGNOSIS — I5032 Chronic diastolic (congestive) heart failure: Secondary | ICD-10-CM

## 2023-09-29 DIAGNOSIS — N1832 Chronic kidney disease, stage 3b: Secondary | ICD-10-CM

## 2023-10-06 ENCOUNTER — Other Ambulatory Visit: Payer: Self-pay | Admitting: Internal Medicine

## 2023-10-06 DIAGNOSIS — I5032 Chronic diastolic (congestive) heart failure: Secondary | ICD-10-CM

## 2023-10-06 DIAGNOSIS — N1832 Chronic kidney disease, stage 3b: Secondary | ICD-10-CM

## 2023-10-06 DIAGNOSIS — N182 Chronic kidney disease, stage 2 (mild): Secondary | ICD-10-CM

## 2023-10-06 NOTE — Telephone Encounter (Signed)
 Copied from CRM [id number]. Topic: Clinical - Medication Refill >> Oct 06, 2023  9:43 AM Florestine Avers wrote: Most Recent Primary Care Visit:  Provider: Etta Grandchild  Department: [facility]  Visit Type: OFFICE VISIT  Date: 08/29/2023  Medication: furosemide (LASIX) 40 MG tablet, dapagliflozin propanediol (FARXIGA) 10 MG TABS tablet  Has the patient contacted their pharmacy? No (Agent: If no, request that the patient contact the pharmacy for the refill. If patient does not wish to contact the pharmacy document the reason why and proceed with request.) (Agent: If yes, when and what did the pharmacy advise?)  Is this the correct pharmacy for this prescription? Yes If no, delete pharmacy and type the correct one.  This is the patient's preferred pharmacy:   [facility] - [place], [place] - [address] [place] [address] Phone: [phone] Fax: [phone]   Has the prescription been filled recently? Yes  Is the patient out of the medication? Yes  Has the patient been seen for an appointment in the last year OR does the patient have an upcoming appointment? Yes  Can we respond through MyChart? Yes  Agent: Please be advised that Rx refills may take up to 3 business days. We ask that you follow-up with your pharmacy.

## 2023-10-11 ENCOUNTER — Encounter: Payer: Self-pay | Admitting: Cardiovascular Disease

## 2023-10-11 ENCOUNTER — Ambulatory Visit: Payer: Medicare HMO | Attending: Cardiovascular Disease | Admitting: Cardiovascular Disease

## 2023-10-11 VITALS — BP 110/71 | HR 99 | Ht 62.0 in | Wt 178.0 lb

## 2023-10-11 DIAGNOSIS — I4821 Permanent atrial fibrillation: Secondary | ICD-10-CM

## 2023-10-11 DIAGNOSIS — E7800 Pure hypercholesterolemia, unspecified: Secondary | ICD-10-CM

## 2023-10-11 DIAGNOSIS — I2510 Atherosclerotic heart disease of native coronary artery without angina pectoris: Secondary | ICD-10-CM

## 2023-10-11 DIAGNOSIS — E119 Type 2 diabetes mellitus without complications: Secondary | ICD-10-CM | POA: Diagnosis not present

## 2023-10-11 DIAGNOSIS — I5032 Chronic diastolic (congestive) heart failure: Secondary | ICD-10-CM | POA: Diagnosis not present

## 2023-10-11 DIAGNOSIS — I10 Essential (primary) hypertension: Secondary | ICD-10-CM

## 2023-10-11 NOTE — Patient Instructions (Signed)
 Medication Instructions:  STOP   isosorbide mononitrate (IMDUR) 120 MG 24 hr tablet     *If you need a refill on your cardiac medications before your next appointment, please call your pharmacy*   Lab Work: NONE    If you have labs (blood work) drawn today and your tests are completely normal, you will receive your results only by: MyChart Message (if you have MyChart) OR A paper copy in the mail If you have any lab test that is abnormal or we need to change your treatment, we will call you to review the results.   Testing/Procedures: NONE    Follow-Up: At [facility], you and your health needs are our priority.  As part of our continuing mission to provide you with exceptional heart care, we have created designated Provider Care Teams.  These Care Teams include your primary Cardiologist (physician) and Advanced Practice Providers (APPs -  Physician Assistants and Nurse Practitioners) who all work together to provide you with the care you need, when you need it.  We recommend signing up for the patient portal called "MyChart".  Sign up information is provided on this After Visit Summary.  MyChart is used to connect with patients for Virtual Visits (Telemedicine).  Patients are able to view lab/test results, encounter notes, upcoming appointments, etc.  Non-urgent messages can be sent to your provider as well.   To learn more about what you can do with MyChart, go to [email or web address].    Your next appointment:   1 year(s)  The format for your next appointment:   In Person  Provider:   Thurmon Fair, MD    Other Instructions

## 2023-10-11 NOTE — Progress Notes (Signed)
 Cardiology Office Note:    Date:  10/11/2023   ID:  [name], DOB September 18, 1933, MRN [id number]  PCP:  [name] Grandchild, MD  Cardiologist:  Thurmon Fair, MD  Electrophysiologist:  None   Referring MD: [name] Grandchild, MD   Chief Complaint  Patient presents with   Atrial Fibrillation    History of Present Illness:    [name] [name] is a 89 y.o. female with a hx of chronic diastolic HF, CAD (mid LAD stent 2006, no significant stenoses at cardiac catheterization 2010 and 2020), paroxysmal atrial fibrillation, HTN, OSA (mild, AHI 5.7/min), moderate obesity, diet controlled DM.  Cardiac catheterization 2020 did report mildly depressed LVEF 45-50%, but this was during atrial fibrillation.  Most recently EF 66% by nuclear scintigraphy 2021.  She is doing well.  Seems to be having some issues with memory.  Her daughter is with her and feels in the gaps.  She repeatedly says that there is "nothing wrong with her".  She does become short of breath making the bed or if she has to put on several layers of clothes.  Does not have orthopnea or PND, but her daughter has occasionally noticed recumbent cough, although she cannot really point at a pattern.  She has not had lower extremity edema.  She denies frank orthopnea or PND and has not had chest pain at rest or with activity.  She has had a handful of falls but no serious wounds in the last 2 years (she did have a fall with head injury in 2023.  She had a serious fall in November 2021 when she had COVID-19 infection and was found to have atrial fibrillation with rapid ventricular response.  She has not had any serious bleeding issues.  She does not have any focal neurological complaints that would raise concern for stroke or TIA.  We did decrease her dose of diltiazem couple of years ago due to complaints of dizziness and unsteady gait.  We previously estimated her "dry weight" to be around 180 pounds and her weight is just under that today she  She  underwent a nuclear stress test on 01/02/2020 which did not show any reversible ischemia, although she did have an apical scar consistent with her previous LAD disease.  EF was normal at 66%.   It appears that she has been in persistent atrial fibrillation now, possibly since November 2021 without interruption.  The last time she had documented sinus rhythm was on the monitor that she wore in June 2021.  Current Medications: Current Meds  Medication Sig   apixaban (ELIQUIS) 5 MG TABS tablet TAKE ONE TABLET (5 MG TOTAL) BY MOUTH TWICE DAILY @ 9AM & 5PM   atorvastatin (LIPITOR) 40 MG tablet TAKE ONE TABLET BY MOUTH DAILY AT 5 PM   dapagliflozin propanediol (FARXIGA) 10 MG TABS tablet TAKE ONE TABLET BY MOUTH DAILY AT 9 AM BEFORE BREAKFAST   diltiazem (CARDIZEM CD) 180 MG 24 hr capsule Take 1 capsule (180 mg total) by mouth daily.   Fluocinolone Acetonide Scalp (DERMA-SMOOTHE/FS SCALP) 0.01 % OIL Apply 1 Act topically at bedtime.   furosemide (LASIX) 40 MG tablet TAKE ONE TABLET BY MOUTH DAILY AT 9 AM   ketoconazole (NIZORAL) 2 % shampoo Apply 1 Application topically 2 (two) times a week.   levothyroxine (SYNTHROID) 75 MCG tablet TAKE 1 TABLET BY MOUTH DAILY BEFORE BREAKFAST.   potassium chloride SA (KLOR-CON M15) 15 MEQ tablet TAKE 1 TABLET BY MOUTH TWICE A DAY KEEP  APPT FOR ADDITIONAL REFILLS   traZODone (DESYREL) 150 MG tablet Take 1.5 tablets (225 mg total) by mouth at bedtime. Taking 1 & 1/2 tablet at bedtime   venlafaxine XR (EFFEXOR-XR) 150 MG 24 hr capsule Take 1 capsule (150 mg total) by mouth daily with breakfast.     Allergies:   No known allergies   Social History   Socioeconomic History   Marital status: Married    Spouse name: Not on file   Number of children: Not on file   Years of education: Not on file   Highest education level: Not on file  Occupational History   Occupation: Daycare provider    Employer: RETIRED    Comment: part-time  Tobacco Use   Smoking status:  Never   Smokeless tobacco: Never  Vaping Use   Vaping status: Never Used  Substance and Sexual Activity   Alcohol use: No   Drug use: No   Sexual activity: Not Currently  Other Topics Concern   Not on file  Social History Narrative   Not on file   Social Drivers of Health   Financial Resource Strain: Not on file  Food Insecurity: No Food Insecurity (02/09/2022)   Hunger Vital Sign    Worried About Running Out of Food in the Last Year: Never true    Ran Out of Food in the Last Year: Never true  Transportation Needs: No Transportation Needs (02/09/2022)   PRAPARE - Administrator, Civil Service (Medical): No    Lack of Transportation (Non-Medical): No  Physical Activity: Not on file  Stress: Not on file  Social Connections: Not on file     Family History: The patient's family history includes Alcoholism in her father; Cancer in her brother and mother.  ROS:   Please see the history of present illness.    All other systems are reviewed and are negative  EKGs/Labs/Other Studies Reviewed:    The following studies were reviewed today: Nuclear stress test 01/02/2020 14-day event monitor 01/23/2020  EKG: ECG is not ordered today, personally reviewed the tracing from 08/29/2023 which shows atrial fibrillation with a rate of 96 bpm, otherwise normal tracing with narrow complex QRS (previous tracings showed right bundle branch block and left axis deviation/LAFB).    Recent Labs: 12/20/2022: ALT 25 08/29/2023: BUN 13; Creatinine, Ser 0.83; Hemoglobin 16.1; Platelets 288.0; Potassium 4.0; Sodium 140; TSH 0.85  Recent Lipid Panel    Component Value Date/Time   CHOL 145 08/29/2023 1544   CHOL 203 (H) 02/12/2020 0812   TRIG 126.0 08/29/2023 1544   HDL 55.90 08/29/2023 1544   HDL 57 02/12/2020 0812   CHOLHDL 3 08/29/2023 1544   VLDL 25.2 08/29/2023 1544   LDLCALC 64 08/29/2023 1544   LDLCALC 127 (H) 02/12/2020 0812   LDLDIRECT 87.0 01/26/2021 1400    Physical Exam:     VS:  BP 110/71 (BP Location: Left Arm, Patient Position: Sitting)   Pulse 99   Ht 5\' 2"  (1.575 m)   Wt 178 lb (80.7 kg)   SpO2 97%   BMI 32.56 kg/m     Wt Readings from Last 3 Encounters:  10/11/23 178 lb (80.7 kg)  08/29/23 178 lb (80.7 kg)  05/25/23 180 lb (81.6 kg)      General: Alert, oriented x3, no distress, appears well.  Mildly obese Head: no evidence of trauma, PERRL, EOMI, no exophtalmos or lid lag, no myxedema, no xanthelasma; normal ears, nose and oropharynx Neck: normal jugular venous  pulsations and no hepatojugular reflux; brisk carotid pulses without delay and no carotid bruits Chest: clear to auscultation, no signs of consolidation by percussion or palpation, normal fremitus, symmetrical and full respiratory excursions Cardiovascular: normal position and quality of the apical impulse, irregular rhythm, normal first and second heart sounds, no murmurs, rubs or gallops Abdomen: no tenderness or distention, no masses by palpation, no abnormal pulsatility or arterial bruits, normal bowel sounds, no hepatosplenomegaly Extremities: no clubbing, cyanosis or edema; 2+ radial, ulnar and brachial pulses bilaterally; 2+ right femoral, posterior tibial and dorsalis pedis pulses; 2+ left femoral, posterior tibial and dorsalis pedis pulses; no subclavian or femoral bruits Neurological: grossly nonfocal Psych: Normal mood and affect    ASSESSMENT:    1. Chronic diastolic HF (heart failure) (HCC)   2. Permanent atrial fibrillation (HCC)   3. Coronary artery disease involving native coronary artery of native heart without angina pectoris   4. Type 2 diabetes mellitus without complication, without long-term current use of insulin (HCC)   5. Hypercholesterolemia   6. Essential hypertension      PLAN:    In order of problems listed above:  CHF: NYHA functional class 2-3, but appears to be clinically euvolemic and at her "dry weight" under 180 pounds.  Combined ischemic and  nonischemic mechanism. AFib: Appears to be a longstanding persistent arrhythmia at this point, probably ongoing since November 2021.  In the past we had to use both metoprolol and diltiazem for rate control, but she seems to have a less requirement for AV nodal blocking agents with advancing age.  Dose of diltiazem was reduced due to its effect on her gait stability and blood pressure.   I am a little concerned that the rate control is not optimal with activity, but she has had relatively low blood pressures, dizziness and falls.  No changes made to her medications today.CHADSVasc 7 (age 89, gender, HF, CAD, DM, HTN).  CAD: Asymptomatic.  She has been out of isosorbide mononitrate for 6 months and has not had any chest pain.  We can stop that medication.  No significant stenoses at coronary angiography in 2020 and no reversible perfusion abnormalities nuclear stress test in May 2021.  She has really not had any disease progression since 2006 when she received a stent to the LAD.  Not on aspirin due to full anticoagulation.  On statin with lipids in target range. DM: Well-controlled.  Hemoglobin A1c 6.0%. On Farxiga, which is also indicated for heart failure. HLP: All lipid currently is in target range.  Continue statin. HTN: Over the years she has required reduction in antihypertensive medications due to symptomatic hypotension and falls.  Currently on diltiazem.  Her daughter is not really sure whether she is taking the losartan but will check.  Her daughter will check and see if she is still taking losartan. OSA: Does not have daytime hypersomnolence.   Medication Adjustments/Labs and Tests Ordered: Current medicines are reviewed at length with the patient today.  Concerns regarding medicines are outlined above.  No orders of the defined types were placed in this encounter.  No orders of the defined types were placed in this encounter.   Patient Instructions  Medication Instructions:  STOP    isosorbide mononitrate (IMDUR) 120 MG 24 hr tablet     *If you need a refill on your cardiac medications before your next appointment, please call your pharmacy*   Lab Work: NONE    If you have labs (blood work) drawn today and  your tests are completely normal, you will receive your results only by: MyChart Message (if you have MyChart) OR A paper copy in the mail If you have any lab test that is abnormal or we need to change your treatment, we will call you to review the results.   Testing/Procedures: NONE    Follow-Up: At [facility], you and your health needs are our priority.  As part of our continuing mission to provide you with exceptional heart care, we have created designated Provider Care Teams.  These Care Teams include your primary Cardiologist (physician) and Advanced Practice Providers (APPs -  Physician Assistants and Nurse Practitioners) who all work together to provide you with the care you need, when you need it.  We recommend signing up for the patient portal called "MyChart".  Sign up information is provided on this After Visit Summary.  MyChart is used to connect with patients for Virtual Visits (Telemedicine).  Patients are able to view lab/test results, encounter notes, upcoming appointments, etc.  Non-urgent messages can be sent to your provider as well.   To learn more about what you can do with MyChart, go to [email or web address].    Your next appointment:   1 year(s)  The format for your next appointment:   In Person  Provider:   Thurmon Fair, MD    Other Instructions    Signed, Thurmon Fair, MD  10/11/2023 1:01 PM    [facility]

## 2023-10-25 ENCOUNTER — Other Ambulatory Visit: Payer: Self-pay | Admitting: Internal Medicine

## 2023-10-25 MED ORDER — CLOBETASOL PROPIONATE 0.05 % EX CREA: 1.0000 | TOPICAL_CREAM | Freq: Two times a day (BID) | CUTANEOUS | 1 refills | Status: DC

## 2023-10-25 NOTE — Telephone Encounter (Signed)
 Copied from CRM [id number]. Topic: Clinical - Medication Refill >> Oct 25, 2023  4:07 PM Elizebeth Brooking wrote: Most Recent Primary Care Visit:  Provider: Etta Grandchild  Department: [facility]  Visit Type: OFFICE VISIT  Date: 08/29/2023  Medication: clobetasol (TEMOVATE) 0.05 % external solution  Has the patient contacted their pharmacy? Yes (Agent: If no, request that the patient contact the pharmacy for the refill. If patient does not wish to contact the pharmacy document the reason why and proceed with request.) (Agent: If yes, when and what did the pharmacy advise?)  Is this the correct pharmacy for this prescription? Yes If no, delete pharmacy and type the correct one.  This is the patient's preferred pharmacy:    [facility] - [place], [place] - [address] [place] [address] Phone: [phone] Fax: [phone]   Has the prescription been filled recently? No  Is the patient out of the medication? Yes  Has the patient been seen for an appointment in the last year OR does the patient have an upcoming appointment? Yes  Can we respond through MyChart? Yes  Agent: Please be advised that Rx refills may take up to 3 business days. We ask that you follow-up with your pharmacy.

## 2023-10-28 ENCOUNTER — Other Ambulatory Visit: Payer: Self-pay | Admitting: Internal Medicine

## 2023-10-28 ENCOUNTER — Other Ambulatory Visit: Payer: Self-pay | Admitting: Cardiovascular Disease

## 2023-10-28 DIAGNOSIS — E785 Hyperlipidemia, unspecified: Secondary | ICD-10-CM

## 2023-10-31 ENCOUNTER — Other Ambulatory Visit: Payer: Self-pay

## 2023-10-31 ENCOUNTER — Telehealth: Payer: Self-pay

## 2023-10-31 DIAGNOSIS — E785 Hyperlipidemia, unspecified: Secondary | ICD-10-CM

## 2023-10-31 MED ORDER — ATORVASTATIN CALCIUM 40 MG PO TABS: 40.0000 mg | ORAL_TABLET | Freq: Every day | ORAL | 3 refills | Status: DC

## 2023-10-31 NOTE — Telephone Encounter (Signed)
 Patient doesn't use [facility] as her pharmacy. Her cardiologist office has been notified because one of her medications was sent there yesterday and it doesn't need to be there.

## 2023-10-31 NOTE — Telephone Encounter (Signed)
 Copied from CRM [id number]. Topic: Clinical - Prescription Issue >> Oct 31, 2023  9:20 AM Theodis Sato wrote:  Reason for CRM: Adam from [employer] is requesting the patients provider to resend the  clobetasol (TEMOVATE) 0.05 % external solution // atorvastatin (LIPITOR) 40 MG tablet  Madelaine Bhat states that their systems are having technical difficulties, I advised him the provider sent these just yesterday 3/17 but Madelaine Bhat insisted they must be sent again.

## 2023-10-31 NOTE — Telephone Encounter (Signed)
 Called pt to verify pharmacy. Refill sent to correct pharmacy.

## 2023-11-29 DIAGNOSIS — R7303 Prediabetes: Secondary | ICD-10-CM | POA: Diagnosis not present

## 2023-11-29 DIAGNOSIS — H54413A Blindness right eye category 3, normal vision left eye: Secondary | ICD-10-CM | POA: Diagnosis not present

## 2024-01-02 ENCOUNTER — Telehealth: Payer: Self-pay

## 2024-01-02 NOTE — Telephone Encounter (Signed)
 Copied from CRM [id number]. Topic: Clinical - Medication Question >> Jan 02, 2024  1:06 PM Caliyah H wrote: Reason for CRM: Avanell Bob from [employer] called requesting a medication refill for Farxiga  on behalf of the patient.  Callback Number: [phone]

## 2024-01-02 NOTE — Telephone Encounter (Signed)
 Patient has made it very clear that she doesn't use this pharmacy. I will not send any medication nor will I call them back.

## 2024-01-10 ENCOUNTER — Other Ambulatory Visit: Payer: Self-pay | Admitting: Internal Medicine

## 2024-01-10 ENCOUNTER — Telehealth: Payer: Self-pay | Admitting: Cardiovascular Disease

## 2024-01-10 ENCOUNTER — Other Ambulatory Visit: Payer: Self-pay

## 2024-01-10 DIAGNOSIS — I5032 Chronic diastolic (congestive) heart failure: Secondary | ICD-10-CM

## 2024-01-10 DIAGNOSIS — N1832 Chronic kidney disease, stage 3b: Secondary | ICD-10-CM

## 2024-01-10 DIAGNOSIS — I4821 Permanent atrial fibrillation: Secondary | ICD-10-CM

## 2024-01-10 DIAGNOSIS — E1122 Type 2 diabetes mellitus with diabetic chronic kidney disease: Secondary | ICD-10-CM

## 2024-01-10 MED ORDER — APIXABAN 5 MG PO TABS: 5.0000 mg | ORAL_TABLET | Freq: Two times a day (BID) | ORAL | 1 refills | Status: DC

## 2024-01-10 NOTE — Telephone Encounter (Unsigned)
 Copied from CRM [id number]. Topic: Clinical - Medication Refill >> Jan 10, 2024 11:58 AM Chuck Crater wrote: Medication: furosemide  (LASIX ) 40 MG tablet, dapagliflozin  propanediol (FARXIGA ) 10 MG TABS tablet  Has the patient contacted their pharmacy? Yes (Agent: If no, request that the patient contact the pharmacy for the refill. If patient does not wish to contact the pharmacy document the reason why and proceed with request.) (Agent: If yes, when and what did the pharmacy advise?) patient called to request refills   This is the patient's preferred pharmacy:   [facility] - [place], [place] - [address] [place] [address] Phone: [phone] Fax: [phone]  Is this the correct pharmacy for this prescription? Yes If no, delete pharmacy and type the correct one.   Has the prescription been filled recently? Yes  Is the patient out of the medication?  N/A  Has the patient been seen for an appointment in the last year OR does the patient have an upcoming appointment? Yes  Can we respond through MyChart? Yes  Agent: Please be advised that Rx refills may take up to 3 business days. We ask that you follow-up with your pharmacy.

## 2024-01-10 NOTE — Telephone Encounter (Signed)
 Prescription refill request for Eliquis  received. Indication:afib Last office visit:2/25 Scr:0.83  1/25 Age: 89 Weight:80.7  kg  Prescription refilled

## 2024-01-10 NOTE — Telephone Encounter (Signed)
*  STAT* If patient is at the pharmacy, call can be transferred to refill team.   1. Which medications need to be refilled? (please list name of each medication and dose if known)  apixaban  (ELIQUIS ) 5 MG TABS tablet    2. Would you like to learn more about the convenience, safety, & potential cost savings by using the [facility]?    3. Are you open to using the [facility] (Type [facility].  ).   4. Which pharmacy/location (including street and city if local pharmacy) is medication to be sent to? [id number] - [place], [place] - [address] [facility]    5. Do they need a 30 day or 90 day supply? 90 day

## 2024-01-30 ENCOUNTER — Ambulatory Visit

## 2024-02-21 ENCOUNTER — Ambulatory Visit

## 2024-02-23 ENCOUNTER — Other Ambulatory Visit: Payer: Self-pay | Admitting: Internal Medicine

## 2024-02-26 ENCOUNTER — Ambulatory Visit: Payer: Medicare HMO | Admitting: Internal Medicine

## 2024-03-21 ENCOUNTER — Other Ambulatory Visit: Payer: Self-pay | Admitting: Internal Medicine

## 2024-03-21 DIAGNOSIS — E039 Hypothyroidism, unspecified: Secondary | ICD-10-CM

## 2024-03-21 NOTE — Telephone Encounter (Signed)
 Last OV 08/29/23 Next OV 03/26/24  Last refill 06/26/23 Qty #90/1

## 2024-03-25 ENCOUNTER — Other Ambulatory Visit: Payer: Self-pay | Admitting: Internal Medicine

## 2024-03-25 DIAGNOSIS — I5032 Chronic diastolic (congestive) heart failure: Secondary | ICD-10-CM

## 2024-03-26 ENCOUNTER — Ambulatory Visit (INDEPENDENT_AMBULATORY_CARE_PROVIDER_SITE_OTHER): Admitting: Internal Medicine

## 2024-03-26 ENCOUNTER — Encounter: Payer: Self-pay | Admitting: Internal Medicine

## 2024-03-26 VITALS — BP 124/78 | HR 78 | Temp 98.6°F | Resp 16 | Ht 62.0 in | Wt 172.0 lb

## 2024-03-26 DIAGNOSIS — I4821 Permanent atrial fibrillation: Secondary | ICD-10-CM

## 2024-03-26 DIAGNOSIS — E785 Hyperlipidemia, unspecified: Secondary | ICD-10-CM | POA: Diagnosis not present

## 2024-03-26 DIAGNOSIS — E039 Hypothyroidism, unspecified: Secondary | ICD-10-CM | POA: Diagnosis not present

## 2024-03-26 DIAGNOSIS — N182 Chronic kidney disease, stage 2 (mild): Secondary | ICD-10-CM | POA: Diagnosis not present

## 2024-03-26 DIAGNOSIS — I5032 Chronic diastolic (congestive) heart failure: Secondary | ICD-10-CM

## 2024-03-26 DIAGNOSIS — N1832 Chronic kidney disease, stage 3b: Secondary | ICD-10-CM | POA: Diagnosis not present

## 2024-03-26 DIAGNOSIS — E1122 Type 2 diabetes mellitus with diabetic chronic kidney disease: Secondary | ICD-10-CM

## 2024-03-26 DIAGNOSIS — E119 Type 2 diabetes mellitus without complications: Secondary | ICD-10-CM

## 2024-03-26 DIAGNOSIS — Z0001 Encounter for general adult medical examination with abnormal findings: Secondary | ICD-10-CM

## 2024-03-26 LAB — HEPATIC FUNCTION PANEL
ALT: 19 U/L (ref 0–35)
AST: 19 U/L (ref 0–37)
Albumin: 4.5 g/dL (ref 3.5–5.2)
Alkaline Phosphatase: 98 U/L (ref 39–117)
Bilirubin, Direct: 0.2 mg/dL (ref 0.0–0.3)
Total Bilirubin: 0.7 mg/dL (ref 0.2–1.2)
Total Protein: 7.8 g/dL (ref 6.0–8.3)

## 2024-03-26 LAB — BASIC METABOLIC PANEL WITH GFR
BUN: 15 mg/dL (ref 6–23)
CO2: 31 meq/L (ref 19–32)
Calcium: 9.5 mg/dL (ref 8.4–10.5)
Chloride: 99 meq/L (ref 96–112)
Creatinine, Ser: 0.77 mg/dL (ref 0.40–1.20)
GFR: 68.84 mL/min (ref >60.00)
Glucose, Bld: 80 mg/dL (ref 70–99)
Potassium: 4 meq/L (ref 3.5–5.1)
Sodium: 139 meq/L (ref 135–145)

## 2024-03-26 LAB — CBC WITH DIFFERENTIAL/PLATELET
Basophils Absolute: 0 K/uL (ref 0.0–0.1)
Basophils Relative: 0.4 % (ref 0.0–3.0)
Eosinophils Absolute: 0.1 K/uL (ref 0.0–0.7)
Eosinophils Relative: 1.5 % (ref 0.0–5.0)
HCT: 46.6 % — ABNORMAL HIGH (ref 36.0–46.0)
Hemoglobin: 15.7 g/dL — ABNORMAL HIGH (ref 12.0–15.0)
Lymphocytes Relative: 23.6 % (ref 12.0–46.0)
Lymphs Abs: 1.9 K/uL (ref 0.7–4.0)
MCHC: 33.7 g/dL (ref 30.0–36.0)
MCV: 90.9 fl (ref 78.0–100.0)
Monocytes Absolute: 0.7 K/uL (ref 0.1–1.0)
Monocytes Relative: 8.1 % (ref 3.0–12.0)
Neutro Abs: 5.4 K/uL (ref 1.4–7.7)
Neutrophils Relative %: 66.4 % (ref 43.0–77.0)
Platelets: 289 K/uL (ref 150.0–400.0)
RBC: 5.13 Mil/uL — ABNORMAL HIGH (ref 3.87–5.11)
RDW: 13.7 % (ref 11.5–15.5)
WBC: 8.1 K/uL (ref 4.0–10.5)

## 2024-03-26 LAB — TSH: TSH: 0.93 u[IU]/mL (ref 0.35–5.50)

## 2024-03-26 LAB — LIPID PANEL
Cholesterol: 147 mg/dL (ref 0–200)
HDL: 65.7 mg/dL (ref >39.00)
LDL Cholesterol: 48 mg/dL (ref 0–99)
NonHDL: 80.93
Total CHOL/HDL Ratio: 2
Triglycerides: 167 mg/dL — ABNORMAL HIGH (ref 0.0–149.0)
VLDL: 33.4 mg/dL (ref 0.0–40.0)

## 2024-03-26 NOTE — Progress Notes (Signed)
 Subjective:  Patient ID: [name] [name], female    DOB: Sep 25, 1933  Age: 89 y.o. MRN: [id number]  CC: Annual Exam, Congestive Heart Failure, Diabetes, Hypothyroidism, and Hyperlipidemia   HPI [name] [name] presents for a CPX and f/up ---  Discussed the use of AI scribe software for clinical note transcription with the patient, who gave verbal consent to proceed.  History of Present Illness [name] [name] is an 89 year old female who presents with persistent daytime sleepiness.  She experiences persistent daytime sleepiness despite sleeping well at night. She wakes up at least three times each night to use the bathroom, get a drink of water, and sit in the living room before returning to bed. She takes a sleeping pill, half of which she consumes every night, although she cannot recall the name of the medication.  No dizziness, lightheadedness, chest pain, shortness of breath, coughing, wheezing, indigestion, painful swallowing, abdominal pain, swelling in her legs or feet, palpitations, or any side effects from her medications. Bowel movements are normal.  She has a history of an underactive thyroid  gland. She denies any bleeding or bruising from blood thinners.    Outpatient Medications Prior to Visit  Medication Sig Dispense Refill   acetaminophen  (TYLENOL ) 650 MG CR tablet Take 1,300 mg by mouth every 8 (eight) hours as needed for pain.     apixaban  (ELIQUIS ) 5 MG TABS tablet Take 1 tablet (5 mg total) by mouth 2 (two) times daily. 180 tablet 1   atorvastatin  (LIPITOR ) 40 MG tablet Take 1 tablet (40 mg total) by mouth daily. At 5 PM 90 tablet 3   clobetasol  (TEMOVATE ) 0.05 % external solution APPLY ONE APPLICATION TWICE DAILY 50 mL 11   clobetasol  cream (TEMOVATE ) 0.05 % Apply 1 Application topically 2 (two) times daily. 30 g 1   diltiazem  (CARDIZEM  CD) 180 MG 24 hr capsule Take 1 capsule (180 mg total) by mouth daily. 90 capsule 3   Fluocinolone  Acetonide Scalp (DERMA-SMOOTHE /FS  SCALP) 0.01 % OIL Apply 1 Act topically at bedtime. 118.28 mL 1   furosemide  (LASIX ) 40 MG tablet TAKE ONE TABLET BY MOUTH DAILY AT 9 AM 90 tablet 1   ketoconazole  (NIZORAL ) 2 % shampoo Apply 1 Application topically 2 (two) times a week. 120 mL 2   levothyroxine  (SYNTHROID ) 75 MCG tablet TAKE 1 TABLET BY MOUTH DAILY BEFORE BREAKFAST. 90 tablet 0   potassium chloride  SA (KLOR-CON  M15) 15 MEQ tablet TAKE 1 TABLET BY MOUTH TWICE A DAY KEEP APPT FOR ADDITIONAL REFILLS 180 tablet 1   traZODone  (DESYREL ) 150 MG tablet Take 1.5 tablets (225 mg total) by mouth at bedtime. Taking 1 & 1/2 tablet at bedtime 135 tablet 1   venlafaxine  XR (EFFEXOR -XR) 150 MG 24 hr capsule Take 1 capsule (150 mg total) by mouth daily with breakfast. 90 capsule 1   dapagliflozin  propanediol (FARXIGA ) 10 MG TABS tablet TAKE ONE TABLET BY MOUTH DAILY AT 9 AM BEFORE BREAKFAST 90 tablet 1   Capsaicin  0.05 % CREA Apply 1 Act topically 2 (two) times daily as needed. 57 g 0   losartan  (COZAAR ) 100 MG tablet Take 100 mg by mouth daily. (Patient not taking: Reported on 03/27/2024)     No facility-administered medications prior to visit.    ROS Review of Systems  Constitutional:  Positive for fatigue. Negative for appetite change, chills and diaphoresis.  Respiratory: Negative.  Negative for cough, chest tightness, shortness of breath and wheezing.   Cardiovascular:  Negative for  chest pain, palpitations and leg swelling.  Gastrointestinal:  Negative for abdominal pain, constipation, diarrhea, nausea and vomiting.  Endocrine: Negative.   Genitourinary:  Negative for difficulty urinating and dysuria.  Musculoskeletal:  Positive for arthralgias. Negative for myalgias.  Skin: Negative.   Neurological: Negative.  Negative for dizziness, weakness and numbness.  Hematological:  Negative for adenopathy. Does not bruise/bleed easily.  Psychiatric/Behavioral: Negative.      Objective:  BP 124/78 (BP Location: Left Arm, Patient Position:  Sitting, Cuff Size: Normal)   Pulse 78   Temp 98.6 F (37 C) (Oral)   Resp 16   Ht 5' 2 (1.575 m)   Wt 172 lb (78 kg)   SpO2 96%   BMI 31.46 kg/m   BP Readings from Last 3 Encounters:  03/26/24 124/78  10/11/23 110/71  08/29/23 124/78    Wt Readings from Last 3 Encounters:  03/27/24 172 lb (78 kg)  03/26/24 172 lb (78 kg)  10/11/23 178 lb (80.7 kg)    Physical Exam Vitals reviewed.  Constitutional:      Appearance: Normal appearance.  HENT:     Nose: Nose normal.     Mouth/Throat:     Mouth: Mucous membranes are moist.  Eyes:     General: No scleral icterus.    Conjunctiva/sclera: Conjunctivae normal.  Cardiovascular:     Rate and Rhythm: Normal rate. Rhythm irregularly irregular.     Heart sounds: No murmur heard.    No friction rub. No gallop.  Pulmonary:     Effort: Pulmonary effort is normal.     Breath sounds: No stridor. No wheezing, rhonchi or rales.  Abdominal:     General: Abdomen is flat.     Palpations: There is no mass.     Tenderness: There is no abdominal tenderness. There is no guarding.     Hernia: No hernia is present.  Musculoskeletal:        General: Normal range of motion.     Cervical back: Neck supple.     Right lower leg: No edema.     Left lower leg: No edema.  Skin:    General: Skin is warm and dry.  Neurological:     General: No focal deficit present.     Mental Status: She is alert.  Psychiatric:        Mood and Affect: Mood normal.        Behavior: Behavior normal.     Lab Results  Component Value Date   WBC 8.1 03/26/2024   HGB 15.7 (H) 03/26/2024   HCT 46.6 (H) 03/26/2024   PLT 289.0 03/26/2024   GLUCOSE 80 03/26/2024   CHOL 147 03/26/2024   TRIG 167.0 (H) 03/26/2024   HDL 65.70 03/26/2024   LDLDIRECT 87.0 01/26/2021   LDLCALC 48 03/26/2024   ALT 19 03/26/2024   AST 19 03/26/2024   NA 139 03/26/2024   K 4.0 03/26/2024   CL 99 03/26/2024   CREATININE 0.77 03/26/2024   BUN 15 03/26/2024   CO2 31 03/26/2024    TSH 0.93 03/26/2024   INR 0.96 09/13/2016   HGBA1C 6.2 03/26/2024    MR Brain Wo Contrast Result Date: 09/06/2021 CLINICAL DATA:  Ataxia, nontraumatic, stroke excluded. Forgetfulness and confusion. EXAM: MRI HEAD WITHOUT CONTRAST TECHNIQUE: Multiplanar, multiecho pulse sequences of the brain and surrounding structures were obtained without intravenous contrast. COMPARISON:  None. FINDINGS: Brain: Diffusion imaging does not show any acute or subacute infarction. Chronic small-vessel ischemic changes affect the pons.  No focal cerebellar finding. 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 No cortical or large vessel territory infarction. No mass lesion, hemorrhage, hydrocephalus or extra-axial collection. Vascular: Major vessels at the base of the brain show flow. Skull and upper cervical spine: Negative Sinuses/Orbits: Clear/normal Other: None IMPRESSION: No acute or reversible finding. Chronic small-vessel ischemic changes of the pons, thalami, basal ganglia and cerebral hemispheric white matter. Electronically Signed   By: Oneil Officer M.D.   On: 09/06/2021 14:43    Assessment & Plan:  Type 2 diabetes mellitus without complication, without long-term current use of insulin  (HCC) -     Basic metabolic panel with GFR; Future -     CBC with Differential/Platelet; Future -     Hemoglobin A1c; Future  Chronic diastolic HF (heart failure) (HCC) -     Basic metabolic panel with GFR; Future -     Dapagliflozin  Propanediol; Take 1 tablet (10 mg total) by mouth daily.  Dispense: 90 tablet; Refill: 1  Acquired hypothyroidism- She is euthyroid. -     TSH; Future  Dyslipidemia, goal LDL below 70 -     Lipid panel; Future -     Hepatic function panel; Future  Encounter for general adult medical examination with abnormal findings- Exam completed, labs reviewed, vaccines  reviewed, no cancer screenings indicated, pt ed material was given.   Permanent atrial fibrillation (HCC)- She has good rate control.  Type 2 diabetes mellitus with stage 2 chronic kidney disease, without long-term current use of insulin  (HCC) -     Dapagliflozin  Propanediol; Take 1 tablet (10 mg total) by mouth daily.  Dispense: 90 tablet; Refill: 1  Stage 3b chronic kidney disease (HCC) -     Dapagliflozin  Propanediol; Take 1 tablet (10 mg total) by mouth daily.  Dispense: 90 tablet; Refill: 1     Follow-up: Return in about 6 months (around 09/26/2024).  Debby Molt, MD

## 2024-03-26 NOTE — Patient Instructions (Signed)

## 2024-03-27 ENCOUNTER — Ambulatory Visit

## 2024-03-27 ENCOUNTER — Ambulatory Visit: Payer: Self-pay | Admitting: Internal Medicine

## 2024-03-27 VITALS — Ht 63.0 in | Wt 172.0 lb

## 2024-03-27 DIAGNOSIS — Z0000 Encounter for general adult medical examination without abnormal findings: Secondary | ICD-10-CM | POA: Diagnosis not present

## 2024-03-27 LAB — HEMOGLOBIN A1C: Hgb A1c MFr Bld: 6.2 % (ref 4.6–6.5)

## 2024-03-27 NOTE — Patient Instructions (Addendum)
 Ms. [name] [name] , Thank you for taking time out of your busy schedule to complete your Annual Wellness Visit with me. I enjoyed our conversation and look forward to speaking with you again next year. I, as well as your care team,  appreciate your ongoing commitment to your health goals. Please review the following plan we discussed and let me know if I can assist you in the future. Your Game plan/ To Do List    Referrals: If you haven't heard from the office you've been referred to, please reach out to them at the phone provided.   Follow up Visits: We will see or speak with you next year for your Next Medicare AWV with our clinical staff Have you seen your provider in the last 6 months (3 months if uncontrolled diabetes)? Yes  Clinician Recommendations:  Aim for 30 minutes of exercise or brisk walking, 6-8 glasses of water, and 5 servings of fruits and vegetables each day.       This is a list of the screenings recommended for you:  Health Maintenance  Topic Date Due   Eye exam for diabetics  01/27/2023   COVID-19 Vaccine (3 - 2024-25 season) 04/16/2023   Flu Shot  03/15/2024   Complete foot exam   08/28/2024   Hemoglobin A1C  09/26/2024   Medicare Annual Wellness Visit  03/27/2025   DTaP/Tdap/Td vaccine (2 - Td or Tdap) 02/02/2031   Pneumococcal Vaccine for age over 42  Completed   DEXA scan (bone density measurement)  Completed   Zoster (Shingles) Vaccine  Completed   Hepatitis B Vaccine  Aged Out   HPV Vaccine  Aged Out   Meningitis B Vaccine  Aged Out    Advanced directives: (Declined) Advance directive discussed with you today. Even though you declined this today, please call our office should you change your mind, and we can give you the proper paperwork for you to fill out. Advance Care Planning is important because it:  [x]  Makes sure you receive the medical care that is consistent with your values, goals, and preferences  [x]  It provides guidance to your family and loved ones  and reduces their decisional burden about whether or not they are making the right decisions based on your wishes.  Follow the link provided in your after visit summary or read over the paperwork we have mailed to you to help you started getting your Advance Directives in place. If you need assistance in completing these, please reach out to us  so that we can help you!

## 2024-03-27 NOTE — Progress Notes (Signed)
 Subjective:   [name] [name] is a 89 y.o. who presents for a Medicare Wellness preventive visit.  As a reminder, Annual Wellness Visits don't include a physical exam, and some assessments may be limited, especially if this visit is performed virtually. We may recommend an in-person follow-up visit with your provider if needed.  Visit Complete: Virtual I connected with  Jasneet Schobert Ratti on 03/27/24 by a audio enabled telemedicine application and verified that I am speaking with the correct person using two identifiers.  Patient Location: Home  Provider Location: Office/Clinic  I discussed the limitations of evaluation and management by telemedicine. The patient expressed understanding and agreed to proceed.  Vital Signs: Because this visit was a virtual/telehealth visit, some criteria may be missing or patient reported. Any vitals not documented were not able to be obtained and vitals that have been documented are patient reported.  VideoDeclined- This patient declined Librarian, academic. Therefore the visit was completed with audio only.  Persons Participating in Visit: Patient.  AWV Questionnaire: No: Patient Medicare AWV questionnaire was not completed prior to this visit.  Cardiac Risk Factors include: advanced age (>69men, >63 women);diabetes mellitus;dyslipidemia;hypertension;obesity (BMI >30kg/m2)     Objective:    Today's Vitals   03/27/24 1209  Weight: 172 lb (78 kg)  Height: 5' 3 (1.6 m)   Body mass index is 30.47 kg/m.     03/27/2024   12:22 PM 06/26/2020   12:55 AM 06/25/2020    9:05 PM 03/13/2019    5:26 PM 03/12/2019   11:37 PM 09/20/2016    8:44 AM 09/13/2016    1:57 PM  Advanced Directives  Does Patient Have a Medical Advance Directive? No No Unable to assess, patient is non-responsive or altered mental status Yes No No  No   Type of Advance Directive    Healthcare Power of Attorney     Does patient want to make changes to medical  advance directive?    No - Patient declined      Copy of Healthcare Power of Attorney in Chart?    No - copy requested      Would patient like information on creating a medical advance directive? No - Patient declined No - Patient declined     Yes (MAU/Ambulatory/Procedural Areas - Information given)      Data saved with a previous flowsheet row definition    Current Medications (verified) Outpatient Encounter Medications as of 03/27/2024  Medication Sig   acetaminophen  (TYLENOL ) 650 MG CR tablet Take 1,300 mg by mouth every 8 (eight) hours as needed for pain.   apixaban  (ELIQUIS ) 5 MG TABS tablet Take 1 tablet (5 mg total) by mouth 2 (two) times daily.   atorvastatin  (LIPITOR ) 40 MG tablet Take 1 tablet (40 mg total) by mouth daily. At 5 PM   Capsaicin  0.05 % CREA Apply 1 Act topically 2 (two) times daily as needed.   clobetasol  (TEMOVATE ) 0.05 % external solution APPLY ONE APPLICATION TWICE DAILY   clobetasol  cream (TEMOVATE ) 0.05 % Apply 1 Application topically 2 (two) times daily.   dapagliflozin  propanediol (FARXIGA ) 10 MG TABS tablet TAKE ONE TABLET BY MOUTH DAILY AT 9 AM BEFORE BREAKFAST   diltiazem  (CARDIZEM  CD) 180 MG 24 hr capsule Take 1 capsule (180 mg total) by mouth daily.   Fluocinolone  Acetonide Scalp (DERMA-SMOOTHE /FS SCALP) 0.01 % OIL Apply 1 Act topically at bedtime.   furosemide  (LASIX ) 40 MG tablet TAKE ONE TABLET BY MOUTH DAILY AT 9 AM  ketoconazole  (NIZORAL ) 2 % shampoo Apply 1 Application topically 2 (two) times a week.   levothyroxine  (SYNTHROID ) 75 MCG tablet TAKE 1 TABLET BY MOUTH DAILY BEFORE BREAKFAST.   potassium chloride  SA (KLOR-CON  M15) 15 MEQ tablet TAKE 1 TABLET BY MOUTH TWICE A DAY KEEP APPT FOR ADDITIONAL REFILLS   traZODone  (DESYREL ) 150 MG tablet Take 1.5 tablets (225 mg total) by mouth at bedtime. Taking 1 & 1/2 tablet at bedtime   venlafaxine  XR (EFFEXOR -XR) 150 MG 24 hr capsule Take 1 capsule (150 mg total) by mouth daily with breakfast.   losartan   (COZAAR ) 100 MG tablet Take 100 mg by mouth daily. (Patient not taking: Reported on 03/27/2024)   No facility-administered encounter medications on file as of 03/27/2024.    Allergies (verified) No known allergies   History: Past Medical History:  Diagnosis Date   Anginal pain (HCC)    Anxiety    Arthritis    little in my thumbsback & knees    Breast cancer (HCC)    R breast, treated with mastectomy/tomoxifen in early 2000s   Bronchitis    hx of   CAD (coronary artery disease)    The patient had a left heart catheterization in August 2006 showed an EF of 70%, a 70% mid LAD lesion and 80% distal LAD ledsion, as well as 50% ostial first diagonal lesion.  The patient did have a drug-eluting stent placed in her mid LAD at that time.     Chest pressure 03/13/2019   Depression    Diastolic heart failure    Echo 11/10 with EF 55-60%, moderate diastolic dysfunction, no regional WMAs, mild to moderate TR, moderate LAE, mild AI   Frequent urination at night    GERD (gastroesophageal reflux disease)    gone since gallbladder took out   Heart murmur    small   Hypercholesterolemia    Hypertension    Hypothyroidism    Mental disorder    Sleep disturbance    sleep study done- told that there wasn't any apnea concerns, states she is up & down for use of bathroom several times per night    Type II diabetes mellitus (HCC)    controlled w/diet and exercise   Urinary incontinence    Past Surgical History:  Procedure Laterality Date   25 GAUGE PARS PLANA VITRECTOMY WITH 20 GAUGE MVR PORT Right 06/04/2013   Procedure: 25 GAUGE PARS PLANA VITRECTOMY WITH 20 GAUGE MVR PORT / REMOVAL SILICONE OIL RIGHT EYE. With Endolaser;  Surgeon: Norleen JONETTA Ku, MD;  Location: [facility] OR;  Service: Ophthalmology;  Laterality: Right;   ABDOMINAL HYSTERECTOMY  1964   Adenosine Myoview      9/09: EF 69% with normal perfusion images suggesting no ischemia or infarction.     APPENDECTOMY  1954   BACK SURGERY   06/2015   fusion-lumbar   BREAST BIOPSY  2001   right   CARDIAC CATHETERIZATION     CARDIOVERSION N/A 03/15/2019   Procedure: CARDIOVERSION;  Surgeon: Okey Vina GAILS, MD;  Location: [facility] ENDOSCOPY;  Service: Cardiovascular;  Laterality: N/A;   CATARACT EXTRACTION W/ INTRAOCULAR LENS  IMPLANT, BILATERAL  ~ 2000   CHOLECYSTECTOMY  ~ 2004   COLONOSCOPY     CORONARY ANGIOPLASTY     CORONARY ANGIOPLASTY WITH STENT PLACEMENT  2000's   1   DILATION AND CURETTAGE OF UTERUS  1960's   I had 3   EYE SURGERY     /iol    following cataract removal  GAS/FLUID EXCHANGE Right 06/04/2013   Procedure: GAS/FLUID EXCHANGE;  Surgeon: Norleen JONETTA Ku, MD;  Location: [facility] OR;  Service: Ophthalmology;  Laterality: Right;   LEFT HEART CATH AND CORONARY ANGIOGRAPHY N/A 03/14/2019   Procedure: LEFT HEART CATH AND CORONARY ANGIOGRAPHY;  Surgeon: Claudene Victory ORN, MD;  Location: [facility] INVASIVE CV LAB;  Service: Cardiovascular;  Laterality: N/A;   MASTECTOMY  2001   right breast   MEMBRANE PEEL Right 06/04/2013   Procedure: MEMBRANE PEEL;  Surgeon: Norleen JONETTA Ku, MD;  Location: [facility] OR;  Service: Ophthalmology;  Laterality: Right;   NEUROPLASTY / TRANSPOSITION MEDIAN NERVE AT CARPAL TUNNEL BILATERAL  1990's   PARS PLANA VITRECTOMY  04/03/2012   right   PARS PLANA VITRECTOMY  04/03/2012   Procedure: PARS PLANA VITRECTOMY WITH 25 GAUGE;  Surgeon: Norleen JONETTA Ku, MD;  Location: [facility] OR;  Service: Ophthalmology;  Laterality: Right;  Silicone Oil, Perfluron, Repair Complex Traction Retinal Detachment   PLANTAR FASCIA RELEASE  1990's   left   TONSILLECTOMY AND ADENOIDECTOMY  1972   TOTAL KNEE ARTHROPLASTY Left 10/13/2015   Procedure: TOTAL KNEE ARTHROPLASTY;  Surgeon: Maude Herald, MD;  Location: [facility] OR;  Service: Orthopedics;  Laterality: Left;   TOTAL KNEE ARTHROPLASTY Right 09/20/2016   Procedure: TOTAL KNEE ARTHROPLASTY;  Surgeon: Maude Herald, MD;  Location: [facility] OR;  Service: Orthopedics;  Laterality: Right;   ULNAR  NERVE REPAIR     left arm   Family History  Problem Relation Age of Onset   Cancer Mother    Alcoholism Father    Cancer Brother    Social History   Socioeconomic History   Marital status: Legally Separated    Spouse name: Not on file   Number of children: Not on file   Years of education: Not on file   Highest education level: Not on file  Occupational History   Occupation: Daycare provider    Employer: RETIRED    Comment: part-time  Tobacco Use   Smoking status: Never   Smokeless tobacco: Never  Vaping Use   Vaping status: Never Used  Substance and Sexual Activity   Alcohol use: No   Drug use: No   Sexual activity: Not Currently  Other Topics Concern   Not on file  Social History Narrative   Not on file   Social Drivers of Health   Financial Resource Strain: Low Risk  (03/27/2024)   Overall Financial Resource Strain (CARDIA)    Difficulty of Paying Living Expenses: Not hard at all  Food Insecurity: No Food Insecurity (03/27/2024)   Hunger Vital Sign    Worried About Running Out of Food in the Last Year: Never true    Ran Out of Food in the Last Year: Never true  Transportation Needs: No Transportation Needs (03/27/2024)   PRAPARE - Administrator, Civil Service (Medical): No    Lack of Transportation (Non-Medical): No  Physical Activity: Insufficiently Active (03/27/2024)   Exercise Vital Sign    Days of Exercise per Week: 7 days    Minutes of Exercise per Session: 10 min  Stress: No Stress Concern Present (03/27/2024)   [employer] of Occupational Health - Occupational Stress Questionnaire    Feeling of Stress: Only a little  Social Connections: Moderately Isolated (03/27/2024)   Social Connection and Isolation Panel    Frequency of Communication with Friends and Family: More than three times a week    Frequency of Social Gatherings with Friends and Family: More than three  times a week    Attends Religious Services: Never    Active Member of  Clubs or Organizations: Yes    Attends Banker Meetings: Never    Marital Status: Separated    Tobacco Counseling Counseling given: No    Clinical Intake:  Pre-visit preparation completed: Yes  Pain : No/denies pain     BMI - recorded: 30.47 Nutritional Status: BMI > 30  Obese Nutritional Risks: None Diabetes: Yes CBG done?: No Did pt. bring in CBG monitor from home?: No  Lab Results  Component Value Date   HGBA1C 6.2 03/26/2024   HGBA1C 6.1 08/29/2023   HGBA1C 6.2 12/20/2022     How often do you need to have someone help you when you read instructions, pamphlets, or other written materials from your doctor or pharmacy?: 1 - Never  Interpreter Needed?: No  Information entered by :: Verdie Saba, CMA   Activities of Daily Living     03/27/2024   12:12 PM  In your present state of health, do you have any difficulty performing the following activities:  Hearing? 0  Vision? 0  Difficulty concentrating or making decisions? 0  Walking or climbing stairs? 0  Dressing or bathing? 0  Doing errands, shopping? 0  Preparing Food and eating ? N  Using the Toilet? N  In the past six months, have you accidently leaked urine? Y  Comment wears pads  Do you have problems with loss of bowel control? N  Managing your Medications? N  Managing your Finances? N  Housekeeping or managing your Housekeeping? N    Patient Care Team: Joshua Debby CROME, MD as PCP - General (Internal Medicine) Francyne Headland, MD as PCP - Cardiology (Cardiology) Rolan Ezra RAMAN, MD as Consulting Physician (Cardiology) Szabat, Toribio BROCKS, [facility] (Inactive) as Pharmacist (Pharmacist)  I have updated your Care Teams any recent Medical Services you may have received from other providers in the past year.     Assessment:   This is a routine wellness examination for Jeana.  Hearing/Vision screen Hearing Screening - Comments:: Denies hearing difficulties   Vision Screening - Comments::  Wears rx glasses - up to date with routine eye exams with Optometrist   Goals Addressed               This Visit's Progress     Patient Stated (pt-stated)        Patient stated she plans to keep taking medications daily       Depression Screen     03/27/2024   12:14 PM 03/26/2024    3:15 PM 08/29/2023    2:54 PM 06/27/2022   11:15 AM 03/15/2022    3:22 PM 12/14/2021    3:42 PM 08/19/2021    1:15 PM  PHQ 2/9 Scores  PHQ - 2 Score 2 3 0 4 0 6 2  PHQ- 9 Score 6 11  9  0 18 6    Fall Risk     03/27/2024   12:12 PM 03/26/2024    3:15 PM 08/29/2023    2:54 PM 10/19/2022   11:27 AM 06/27/2022   11:15 AM  Fall Risk   Falls in the past year? 0 1 0 0 0  Comment confirmed w/pt - no falls      Number falls in past yr: 0 0 0 0 0  Injury with Fall? 0 0 0 0 0  Risk for fall due to : No Fall Risks No Fall Risks No Fall Risks  Follow up Falls evaluation completed;Falls prevention discussed Falls evaluation completed Falls evaluation completed Falls evaluation completed Falls evaluation completed      Data saved with a previous flowsheet row definition    MEDICARE RISK AT HOME:  Medicare Risk at Home Any stairs in or around the home?: No If so, are there any without handrails?: No Home free of loose throw rugs in walkways, pet beds, electrical cords, etc?: Yes Adequate lighting in your home to reduce risk of falls?: Yes Life alert?: No Use of a cane, walker or w/c?: No Grab bars in the bathroom?: No Shower chair or bench in shower?: Yes Elevated toilet seat or a handicapped toilet?: Yes  TIMED UP AND GO:  Was the test performed?  No  Cognitive Function: 6CIT completed        03/27/2024   12:22 PM  6CIT Screen  What Year? 0 points  What month? 0 points  What time? 0 points  Count back from 20 0 points  Months in reverse 0 points  Repeat phrase 0 points  Total Score 0 points    Immunizations Immunization History  Administered Date(s) Administered   Fluad Quad(high  Dose 65+) 07/28/2021, 06/27/2022   Fluad Trivalent(High Dose 65+) 08/29/2023   Influenza, High Dose Seasonal PF 06/07/2018, 06/02/2019   Influenza, Seasonal, Injecte, Preservative Fre 09/11/2012   Influenza,inj,Quad PF,6+ Mos 06/05/2013, 05/14/2014, 05/14/2015, 06/01/2016, 07/27/2017   PFIZER(Purple Top)SARS-COV-2 Vaccination 09/30/2019, 10/30/2019   Pneumococcal Conjugate-13 08/19/2014   Pneumococcal Polysaccharide-23 08/15/2004, 01/26/2021   Tdap 02/01/2021   Zoster Recombinant(Shingrix ) 06/02/2019, 02/01/2021   Zoster, Live 08/16/2011    Screening Tests Health Maintenance  Topic Date Due   OPHTHALMOLOGY EXAM  01/27/2023   COVID-19 Vaccine (3 - 2024-25 season) 04/16/2023   INFLUENZA VACCINE  03/15/2024   FOOT EXAM  08/28/2024   HEMOGLOBIN A1C  09/26/2024   Medicare Annual Wellness (AWV)  03/27/2025   DTaP/Tdap/Td (2 - Td or Tdap) 02/02/2031   Pneumococcal Vaccine: 50+ Years  Completed   DEXA SCAN  Completed   Zoster Vaccines- Shingrix   Completed   Hepatitis B Vaccines  Aged Out   HPV VACCINES  Aged Out   Meningococcal B Vaccine  Aged Out    Health Maintenance  Health Maintenance Due  Topic Date Due   OPHTHALMOLOGY EXAM  01/27/2023   COVID-19 Vaccine (3 - 2024-25 season) 04/16/2023   INFLUENZA VACCINE  03/15/2024   Health Maintenance Items Addressed: 03/27/2024   Additional Screening:  Vision Screening: Recommended annual ophthalmology exams for early detection of glaucoma and other disorders of the eye. Would you like a referral to an eye doctor? No    Dental Screening: Recommended annual dental exams for proper oral hygiene  Community Resource Referral / Chronic Care Management: CRR required this visit?  No   CCM required this visit?  No   Plan:    I have personally reviewed and noted the following in the patient's chart:   Medical and social history Use of alcohol, tobacco or illicit drugs  Current medications and supplements including opioid  prescriptions. Patient is not currently taking opioid prescriptions. Functional ability and status Nutritional status Physical activity Advanced directives List of other physicians Hospitalizations, surgeries, and ER visits in previous 12 months Vitals Screenings to include cognitive, depression, and falls Referrals and appointments  In addition, I have reviewed and discussed with patient certain preventive protocols, quality metrics, and best practice recommendations. A written personalized care plan for preventive services as well as general preventive health  recommendations were provided to patient.   Verdie CHRISTELLA Saba, CMA   03/27/2024   After Visit Summary: (Declined) Due to this being a telephonic visit, with patients personalized plan was offered to patient but patient Declined AVS at this time   Notes: Scheduled a 6-mth Htn/Diabetes f/u appt w/PCP for 09/2024.

## 2024-03-28 ENCOUNTER — Other Ambulatory Visit: Payer: Self-pay | Admitting: Internal Medicine

## 2024-03-28 DIAGNOSIS — I5032 Chronic diastolic (congestive) heart failure: Secondary | ICD-10-CM

## 2024-03-28 MED ORDER — DAPAGLIFLOZIN PROPANEDIOL 10 MG PO TABS: 10.0000 mg | ORAL_TABLET | Freq: Every day | ORAL | 1 refills | Status: DC

## 2024-03-28 NOTE — Telephone Encounter (Signed)
 Copied from CRM [id number]. Topic: Clinical - Medication Refill >> Mar 28, 2024 10:05 AM Melissa C wrote: Medication: furosemide  (LASIX ) 40 MG tablet  Has the patient contacted their pharmacy? Yes (Agent: If no, request that the patient contact the pharmacy for the refill. If patient does not wish to contact the pharmacy document the reason why and proceed with request.) (Agent: If yes, when and what did the pharmacy advise?)  This is the patient's preferred pharmacy:   [facility] - [place], [place] - [address] [place] [address] Phone: [phone] Fax: [phone]  Is this the correct pharmacy for this prescription? Yes If no, delete pharmacy and type the correct one.   Has the prescription been filled recently? No- I do see in patient's chart it says order filled 8/11 but this refill request came from the pharmacy  Is the patient out of the medication? Yes  Has the patient been seen for an appointment in the last year OR does the patient have an upcoming appointment? Yes  Can we respond through MyChart? No  Agent: Please be advised that Rx refills may take up to 3 business days. We ask that you follow-up with your pharmacy.

## 2024-04-23 ENCOUNTER — Other Ambulatory Visit: Payer: Self-pay | Admitting: Internal Medicine

## 2024-04-23 DIAGNOSIS — I10 Essential (primary) hypertension: Secondary | ICD-10-CM

## 2024-04-23 DIAGNOSIS — E876 Hypokalemia: Secondary | ICD-10-CM

## 2024-05-24 ENCOUNTER — Other Ambulatory Visit: Payer: Self-pay | Admitting: Cardiovascular Disease

## 2024-05-24 DIAGNOSIS — I4821 Permanent atrial fibrillation: Secondary | ICD-10-CM

## 2024-05-24 NOTE — Telephone Encounter (Signed)
 Eliquis  5mg  refill request received. Patient is 89 years old, weight-78kg, Crea-0.77 on 03/26/24, Diagnosis-Afib, and last seen by Dr. Francyne on 10/11/23. Dose is appropriate based on dosing criteria. Will send in refill to requested pharmacy.

## 2024-06-16 ENCOUNTER — Other Ambulatory Visit: Payer: Self-pay | Admitting: Internal Medicine

## 2024-06-16 DIAGNOSIS — E039 Hypothyroidism, unspecified: Secondary | ICD-10-CM

## 2024-07-24 ENCOUNTER — Telehealth: Payer: Self-pay | Admitting: Pharmacist

## 2024-07-24 DIAGNOSIS — I5032 Chronic diastolic (congestive) heart failure: Secondary | ICD-10-CM

## 2024-07-24 DIAGNOSIS — E1122 Type 2 diabetes mellitus with diabetic chronic kidney disease: Secondary | ICD-10-CM

## 2024-07-24 DIAGNOSIS — N1832 Chronic kidney disease, stage 3b: Secondary | ICD-10-CM

## 2024-07-24 MED ORDER — DAPAGLIFLOZIN PROPANEDIOL 10 MG PO TABS: 10.0000 mg | ORAL_TABLET | Freq: Every day | ORAL | 2 refills | Status: DC

## 2024-07-24 NOTE — Progress Notes (Signed)
 Pharmacy Quality Measure Review  This patient is appearing on a report for being at risk of failing the adherence measure for diabetes medications this calendar year.   Medication: Farxiga  (Pt is prescribed for CHF) Last fill date: 05/15/24 for 30 day supply  Spoke to patient and she was not aware of the name of her medication or what she is taking. Her daughter helps with her medications. Called her daughter, Veva, to discuss Farxiga  - she notes pt is still taking it and she fills her pill box up every weekend. Review of fill history shows all of her medications have not been filled since 05/15/24 besides levothyroxine . She had been getting them through [facility]. Pt's daughter plans to check her bottles this weekend to see what she needs refills of.  Will send refill of Farxiga  as she has no refills remaining and saw PCP in August. Next f/u scheduled for February.  Darrelyn Drum, PharmD, BCPS, CPP Clinical Pharmacist Practitioner [facility] at [facility] [phone]

## 2024-08-13 ENCOUNTER — Other Ambulatory Visit: Payer: Self-pay | Admitting: Cardiovascular Disease

## 2024-08-13 DIAGNOSIS — I4821 Permanent atrial fibrillation: Secondary | ICD-10-CM

## 2024-08-13 MED ORDER — APIXABAN 5 MG PO TABS: 5.0000 mg | ORAL_TABLET | Freq: Two times a day (BID) | ORAL | 1 refills | Status: AC

## 2024-08-13 NOTE — Telephone Encounter (Signed)
 Prescription refill request for Eliquis  received. Indication: AF Last office visit: 10/11/23  [name] Croitoru MD Scr: 0.77 on 03/26/24  Epic Age: 89 Weight: 80.7kg  Based on above findings Eliquis  5mg  twice daily is the appropriate dose.  Refill approved.

## 2024-08-13 NOTE — Telephone Encounter (Signed)
" °*  STAT* If patient is at the pharmacy, call can be transferred to refill team.   1. Which medications need to be refilled? (please list name of each medication and dose if known) apixaban  (ELIQUIS ) 5 MG TABS tablet    2. Would you like to learn more about the convenience, safety, & potential cost savings by using the [facility]? NO    3. Are you open to using the [facility] (Type [facility]. No    4. Which pharmacy/location (including street and city if local pharmacy) is medication to be sent to?[id number] - [place], [place] - [address] [facility]    5. Do they need a 30 day or 90 day supply? 90 day   Pt out of medication.  "

## 2024-09-13 ENCOUNTER — Other Ambulatory Visit: Payer: Self-pay

## 2024-09-13 ENCOUNTER — Telehealth: Payer: Self-pay

## 2024-09-13 DIAGNOSIS — F418 Other specified anxiety disorders: Secondary | ICD-10-CM

## 2024-09-13 DIAGNOSIS — E1122 Type 2 diabetes mellitus with diabetic chronic kidney disease: Secondary | ICD-10-CM

## 2024-09-13 DIAGNOSIS — B028 Zoster with other complications: Secondary | ICD-10-CM

## 2024-09-13 DIAGNOSIS — E785 Hyperlipidemia, unspecified: Secondary | ICD-10-CM

## 2024-09-13 DIAGNOSIS — E039 Hypothyroidism, unspecified: Secondary | ICD-10-CM

## 2024-09-13 DIAGNOSIS — N1832 Chronic kidney disease, stage 3b: Secondary | ICD-10-CM

## 2024-09-13 DIAGNOSIS — I5032 Chronic diastolic (congestive) heart failure: Secondary | ICD-10-CM

## 2024-09-13 DIAGNOSIS — I10 Essential (primary) hypertension: Secondary | ICD-10-CM

## 2024-09-13 DIAGNOSIS — E876 Hypokalemia: Secondary | ICD-10-CM

## 2024-09-13 NOTE — Telephone Encounter (Signed)
 Medication refill requests has been sent to Dr. Joshua

## 2024-09-13 NOTE — Telephone Encounter (Signed)
 Copied from CRM [id number]. Topic: Clinical - Medication Refill >> Sep 13, 2024  8:31 AM Montie POUR wrote: Medication:  atorvastatin  (LIPITOR ) 40 MG tablet [ Capsaicin  0.05 % CREA clobetasol  (TEMOVATE ) 0.05 % external solution clobetasol  cream (TEMOVATE ) 0.05 % dapagliflozin  propanediol (FARXIGA ) 10 MG TABS tablet diltiazem  (CARDIZEM  CD) 180 MG 24 hr capsule furosemide  (LASIX ) 40 MG tablet  levothyroxine  (SYNTHROID ) 75 MCG tablet losartan  (COZAAR ) 100 MG tablet -        She is out of this medication potassium chloride  SA (KLOR-CON  M15) 15 MEQ tablet  traZODone  (DESYREL ) 150 MG tablet venlafaxine  XR (EFFEXOR -XR) 150 MG 24 hr capsule  Has the patient contacted their pharmacy? Yes (Agent: If no, request that the patient contact the pharmacy for the refill. If patient does not wish to contact the pharmacy document the reason why and proceed with request.) (Agent: If yes, when and what did the pharmacy advise?)  This is the patient's preferred pharmacy:  [id number] - [place], [place] - [address] AT [facility] [address] [place] [address] Phone: [phone] Fax: [phone]  Is this the correct pharmacy for this prescription? Yes If no, delete pharmacy and type the correct one.   Has the prescription been filled recently? No  Is the patient out of the medication? 1 medication -losartan  (COZAAR ) 100 MG tablet -        She is out of this medication   Has the patient been seen for an appointment in the last year OR does the patient have an upcoming appointment? Yes  Can we respond through MyChart? Yes  Agent: Please be advised that Rx refills may take up to 3 business days. We ask that you follow-up with your pharmacy.

## 2024-09-14 ENCOUNTER — Other Ambulatory Visit: Payer: Self-pay | Admitting: Internal Medicine

## 2024-09-14 DIAGNOSIS — E039 Hypothyroidism, unspecified: Secondary | ICD-10-CM

## 2024-09-15 MED ORDER — DILTIAZEM HCL ER COATED BEADS 180 MG PO CP24: 180.0000 mg | ORAL_CAPSULE | Freq: Every day | ORAL | 0 refills | Status: AC

## 2024-09-15 MED ORDER — VENLAFAXINE HCL ER 150 MG PO CP24: 150.0000 mg | ORAL_CAPSULE | Freq: Every day | ORAL | 0 refills | Status: AC

## 2024-09-15 MED ORDER — LOSARTAN POTASSIUM 100 MG PO TABS: 100.0000 mg | ORAL_TABLET | Freq: Every day | ORAL | 0 refills | Status: AC

## 2024-09-15 MED ORDER — DAPAGLIFLOZIN PROPANEDIOL 10 MG PO TABS: 10.0000 mg | ORAL_TABLET | Freq: Every day | ORAL | 0 refills | Status: AC

## 2024-09-15 MED ORDER — FUROSEMIDE 40 MG PO TABS: ORAL_TABLET | ORAL | 0 refills | Status: AC

## 2024-09-15 MED ORDER — CLOBETASOL PROPIONATE 0.05 % EX CREA: 1.0000 | TOPICAL_CREAM | Freq: Two times a day (BID) | CUTANEOUS | 1 refills | Status: AC

## 2024-09-15 MED ORDER — CLOBETASOL PROPIONATE 0.05 % EX SOLN: Freq: Two times a day (BID) | CUTANEOUS | 1 refills | Status: AC

## 2024-09-15 MED ORDER — LEVOTHYROXINE SODIUM 75 MCG PO TABS: 75.0000 ug | ORAL_TABLET | Freq: Every day | ORAL | 0 refills | Status: AC

## 2024-09-15 MED ORDER — CAPSAICIN 0.05 % EX CREA: 1.0000 | TOPICAL_CREAM | Freq: Two times a day (BID) | CUTANEOUS | 0 refills | Status: AC | PRN

## 2024-09-15 MED ORDER — TRAZODONE HCL 150 MG PO TABS: 225.0000 mg | ORAL_TABLET | Freq: Every day | ORAL | 0 refills | Status: AC

## 2024-09-15 MED ORDER — POTASSIUM CHLORIDE CRYS ER 15 MEQ PO TBCR: EXTENDED_RELEASE_TABLET | ORAL | 0 refills | Status: AC

## 2024-09-15 MED ORDER — ATORVASTATIN CALCIUM 40 MG PO TABS: 40.0000 mg | ORAL_TABLET | Freq: Every day | ORAL | 0 refills | Status: AC

## 2024-09-30 ENCOUNTER — Ambulatory Visit: Admitting: Internal Medicine

## 2024-10-08 ENCOUNTER — Ambulatory Visit: Admitting: Cardiovascular Disease

## 2025-03-31 ENCOUNTER — Ambulatory Visit
